# Patient Record
Sex: Female | Born: 1980 | Race: White | Hispanic: No | Marital: Single | Smoking: Never smoker
Health system: Southern US, Community
[De-identification: ages and names within clinical notes are randomized; demographics above are authoritative.]

## PROBLEM LIST (undated history)

## (undated) DIAGNOSIS — G43909 Migraine, unspecified, not intractable, without status migrainosus: Secondary | ICD-10-CM

## (undated) DIAGNOSIS — J45909 Unspecified asthma, uncomplicated: Secondary | ICD-10-CM

## (undated) DIAGNOSIS — F329 Major depressive disorder, single episode, unspecified: Secondary | ICD-10-CM

## (undated) DIAGNOSIS — F419 Anxiety disorder, unspecified: Secondary | ICD-10-CM

## (undated) DIAGNOSIS — T7840XA Allergy, unspecified, initial encounter: Secondary | ICD-10-CM

## (undated) DIAGNOSIS — F32A Depression, unspecified: Secondary | ICD-10-CM

## (undated) DIAGNOSIS — M1990 Unspecified osteoarthritis, unspecified site: Secondary | ICD-10-CM

## (undated) DIAGNOSIS — O24419 Gestational diabetes mellitus in pregnancy, unspecified control: Secondary | ICD-10-CM

## (undated) HISTORY — DX: Unspecified asthma, uncomplicated: J45.909

## (undated) HISTORY — PX: BACK SURGERY: SHX140

## (undated) HISTORY — DX: Gestational diabetes mellitus in pregnancy, unspecified control: O24.419

## (undated) HISTORY — PX: ABDOMINAL HYSTERECTOMY: SHX81

## (undated) HISTORY — PX: CHOLECYSTECTOMY: SHX55

## (undated) HISTORY — PX: TONSILLECTOMY: SUR1361

---

## 2008-11-09 HISTORY — PX: CHOLECYSTECTOMY: SHX55

## 2013-08-10 DIAGNOSIS — K589 Irritable bowel syndrome without diarrhea: Secondary | ICD-10-CM | POA: Insufficient documentation

## 2013-08-10 DIAGNOSIS — G43109 Migraine with aura, not intractable, without status migrainosus: Secondary | ICD-10-CM | POA: Diagnosis present

## 2013-08-10 DIAGNOSIS — J45909 Unspecified asthma, uncomplicated: Secondary | ICD-10-CM | POA: Diagnosis present

## 2013-08-10 DIAGNOSIS — R1013 Epigastric pain: Secondary | ICD-10-CM | POA: Insufficient documentation

## 2013-08-10 DIAGNOSIS — E039 Hypothyroidism, unspecified: Secondary | ICD-10-CM | POA: Diagnosis present

## 2014-05-24 DIAGNOSIS — Z98891 History of uterine scar from previous surgery: Secondary | ICD-10-CM

## 2014-05-24 DIAGNOSIS — O99280 Endocrine, nutritional and metabolic diseases complicating pregnancy, unspecified trimester: Secondary | ICD-10-CM | POA: Insufficient documentation

## 2014-05-24 HISTORY — DX: History of uterine scar from previous surgery: Z98.891

## 2014-09-01 DIAGNOSIS — O24419 Gestational diabetes mellitus in pregnancy, unspecified control: Secondary | ICD-10-CM | POA: Insufficient documentation

## 2014-09-01 HISTORY — DX: Gestational diabetes mellitus in pregnancy, unspecified control: O24.419

## 2014-11-09 DIAGNOSIS — M069 Rheumatoid arthritis, unspecified: Secondary | ICD-10-CM | POA: Insufficient documentation

## 2014-11-09 HISTORY — DX: Rheumatoid arthritis, unspecified: M06.9

## 2014-12-06 DIAGNOSIS — R519 Headache, unspecified: Secondary | ICD-10-CM | POA: Insufficient documentation

## 2015-08-12 DIAGNOSIS — Z79891 Long term (current) use of opiate analgesic: Secondary | ICD-10-CM | POA: Insufficient documentation

## 2015-11-10 HISTORY — PX: ABDOMINAL HYSTERECTOMY: SHX81

## 2017-10-12 DIAGNOSIS — R202 Paresthesia of skin: Secondary | ICD-10-CM | POA: Insufficient documentation

## 2017-10-12 DIAGNOSIS — E860 Dehydration: Secondary | ICD-10-CM | POA: Insufficient documentation

## 2018-02-27 DIAGNOSIS — E1165 Type 2 diabetes mellitus with hyperglycemia: Secondary | ICD-10-CM

## 2018-02-27 DIAGNOSIS — E119 Type 2 diabetes mellitus without complications: Secondary | ICD-10-CM | POA: Insufficient documentation

## 2018-03-04 DIAGNOSIS — F1220 Cannabis dependence, uncomplicated: Secondary | ICD-10-CM | POA: Diagnosis present

## 2018-03-04 DIAGNOSIS — F13959 Sedative, hypnotic or anxiolytic use, unspecified with sedative, hypnotic or anxiolytic-induced psychotic disorder, unspecified: Secondary | ICD-10-CM

## 2018-03-04 HISTORY — DX: Sedative, hypnotic or anxiolytic use, unspecified with sedative, hypnotic or anxiolytic-induced psychotic disorder, unspecified: F13.959

## 2018-09-26 ENCOUNTER — Emergency Department
Admission: EM | Admit: 2018-09-26 | Discharge: 2018-09-26 | Disposition: A | Discharge status: Home / Self Care | Payer: Managed Care, Other (non HMO) | Attending: Emergency Medicine | Admitting: Emergency Medicine

## 2018-09-26 ENCOUNTER — Encounter: Payer: Self-pay | Admitting: Emergency Medicine

## 2018-09-26 DIAGNOSIS — L509 Urticaria, unspecified: Secondary | ICD-10-CM | POA: Diagnosis not present

## 2018-09-26 DIAGNOSIS — R21 Rash and other nonspecific skin eruption: Secondary | ICD-10-CM | POA: Diagnosis present

## 2018-09-26 HISTORY — DX: Depression, unspecified: F32.A

## 2018-09-26 HISTORY — DX: Unspecified osteoarthritis, unspecified site: M19.90

## 2018-09-26 HISTORY — DX: Allergy, unspecified, initial encounter: T78.40XA

## 2018-09-26 HISTORY — DX: Major depressive disorder, single episode, unspecified: F32.9

## 2018-09-26 HISTORY — DX: Anxiety disorder, unspecified: F41.9

## 2018-09-26 HISTORY — DX: Migraine, unspecified, not intractable, without status migrainosus: G43.909

## 2018-09-26 MED ORDER — SODIUM CHLORIDE 0.9 % IV BOLUS
1000.0000 mL | Freq: Once | INTRAVENOUS | Status: AC
  Administered 2018-09-26: 1000 mL via INTRAVENOUS

## 2018-09-26 MED ORDER — EPINEPHRINE 0.3 MG/0.3ML IJ SOAJ
INTRAMUSCULAR | Status: AC
  Administered 2018-09-26: 0.3 mg via INTRAMUSCULAR
  Filled 2018-09-26: qty 0.3

## 2018-09-26 MED ORDER — EPINEPHRINE 0.3 MG/0.3ML IJ SOAJ
0.3000 mg | Freq: Once | INTRAMUSCULAR | Status: AC
  Administered 2018-09-26: 0.3 mg via INTRAMUSCULAR

## 2018-09-26 MED ORDER — PREDNISONE 10 MG (21) PO TBPK: ORAL_TABLET | ORAL | 0 refills | Status: DC

## 2018-09-26 MED ORDER — EPINEPHRINE 0.3 MG/0.3ML IJ SOAJ: 0.3000 mg | Freq: Once | INTRAMUSCULAR | 1 refills | Status: AC

## 2018-09-26 NOTE — ED Notes (Signed)
Patient is reporting itching to her throat.

## 2018-09-26 NOTE — ED Notes (Signed)
ED Provider at bedside. 

## 2018-09-26 NOTE — ED Provider Notes (Signed)
[facility] Emergency Department Provider Note   ____________________________________________   I have reviewed the triage vital signs and the nursing notes.   HISTORY  Chief Complaint Urticaria   History limited by: Not Limited   HPI Clair Bardwell is a 37 y.o. female who presents to the emergency department today because of concern for diffuse rash and itchiness. The patient states that she has multiple allergies and thinks that this allergic reaction is due to eating a steak at a restaurant where they also prepare seafood. The patient states that the rash started this morning and has been progressive. It is significantly itchy. She did take steroids and benadryl this morning without any relief. The patient denies any difficulty breathing however feels like her mouth is tingling and big. The patient also has had some associated vomiting. She has required epinephrine shots in the past however was hesitant to give herself a shot today because her epipen had expired.   Per medical record review patient has a history of allergic reaction  Past Medical History:  Diagnosis Date  . Allergic reaction   . Anxiety   . Arthritis   . Depression   . Migraines     There are no active problems to display for this patient.   Past Surgical History:  Procedure Laterality Date  . ABDOMINAL HYSTERECTOMY     partial  . BACK SURGERY    . CESAREAN SECTION    . CHOLECYSTECTOMY    . TONSILLECTOMY      Prior to Admission medications   Not on File    Allergies Azithromycin  No family history on file.  Social History Social History   Tobacco Use  . Smoking status: Never Smoker  . Smokeless tobacco: Never Used  Substance Use Topics  . Alcohol use: Not on file  . Drug use: Not on file    Review of Systems Constitutional: No fever/chills Eyes: No visual changes. ENT: Positive for mouth tingling. Cardiovascular: Denies chest pain. Respiratory: Denies  shortness of breath. Gastrointestinal: No abdominal pain.  No nausea, no vomiting.  No diarrhea.   Genitourinary: Negative for dysuria. Musculoskeletal: Negative for back pain. Skin: Positive for rash, itchiness. Neurological: Negative for headaches, focal weakness or numbness.  ____________________________________________   PHYSICAL EXAM:  VITAL SIGNS: ED Triage Vitals  Enc Vitals Group     BP 09/26/18 1541 119/90     Pulse Rate 09/26/18 1541 (!) 101     Resp 09/26/18 1541 16     Temp 09/26/18 1541 98.1 F (36.7 C)     Temp Source 09/26/18 1541 Oral     SpO2 09/26/18 1541 94 %     Weight 09/26/18 1542 175 lb (79.4 kg)     Height 09/26/18 1542 5' 3.5" (1.613 m)     Head Circumference --      Peak Flow --      Pain Score 09/26/18 1541 6   Constitutional: Alert and oriented.  Eyes: Conjunctivae are normal.  ENT      Head: Normocephalic and atraumatic.      Nose: No congestion/rhinnorhea.      Mouth/Throat: Mucous membranes are moist.      Neck: No stridor. Hematological/Lymphatic/Immunilogical: No cervical lymphadenopathy. Cardiovascular: Normal rate, regular rhythm.  No murmurs, rubs, or gallops.  Respiratory: Normal respiratory effort without tachypnea nor retractions. Breath sounds are clear and equal bilaterally. No wheezes/rales/rhonchi. Gastrointestinal: Soft and non tender. No rebound. No guarding.  Genitourinary: Deferred Musculoskeletal: Normal range of motion  in all extremities. No lower extremity edema. Neurologic:  Normal speech and language. No gross focal neurologic deficits are appreciated.  Skin:  Diffuse urticarial rash Psychiatric: Mood and affect are normal. Speech and behavior are normal. Patient exhibits appropriate insight and judgment.  ____________________________________________    LABS (pertinent positives/negatives)  None  ____________________________________________   EKG  None  ____________________________________________     RADIOLOGY  None   ____________________________________________   PROCEDURES  Procedures  ____________________________________________   INITIAL IMPRESSION / ASSESSMENT AND PLAN / ED COURSE  Pertinent labs & imaging results that were available during my care of the patient were reviewed by me and considered in my medical decision making (see chart for details).   Patient presented to the emergency department today because of concerns for urticaria after possible seafood ingestion.  On exam patient had diffuse urticarial rash.  She was complaining of some mild changes.  She had already taken Benadryl and prednisone at home.  Discussed with patient that we can give epinephrine.  Patient states she does not have any heart disease.  Epinephrine was given and patient did have a good symptom relief with epinephrine.  Will plan on discharging with steroids and EpiPen prescriptions.  Prior to discharge patient states she also felt she might have a lump in her right breast near the axilla.  I did palpate this area and while I did not feel an obvious lump did discuss with patient importance of following up with her primary care doctor for possible imaging.  Do wonder however if patient had some lymph nodes in that area given she was also complaining of tenderness.   ____________________________________________   FINAL CLINICAL IMPRESSION(S) / ED DIAGNOSES  Final diagnoses:  Urticaria     Note: This dictation was prepared with Dragon dictation. Any transcriptional errors that result from this process are unintentional     Phineas Semen, MD 09/26/18 [phone]

## 2018-09-26 NOTE — ED Triage Notes (Signed)
Patient presents to the ED with rash and itching.  Patient reports having vomiting and diarrhea on Thursday and then on Saturday, patient began to have a rash.  Patient states, this made her think she was having a food allergy reaction.  Patient states she has had reactions that present like this before.  Patient has rash to face and back at this time.  Patient reports taking a total of 75mg  of benadryl and 40mg  of prednisone at home.  Patient reports having severe allergic reactions in the past.  Patient denies difficulty breathing at this time.  Denies restricted airway.  Patient speaking clearly at this time.

## 2018-09-26 NOTE — Discharge Instructions (Signed)
Please seek medical attention for any high fevers, chest pain, shortness of breath, change in behavior, persistent vomiting, bloody stool or any other new or concerning symptoms.  

## 2018-09-26 NOTE — ED Notes (Signed)
Pt hives significantly decreased, states tongue and lips feeling normal.  MD notified.  Will continue to monitor.

## 2018-10-05 DIAGNOSIS — T7611XA Adult physical abuse, suspected, initial encounter: Secondary | ICD-10-CM | POA: Insufficient documentation

## 2018-10-05 DIAGNOSIS — Z79899 Other long term (current) drug therapy: Secondary | ICD-10-CM | POA: Insufficient documentation

## 2018-10-05 DIAGNOSIS — T424X3A Poisoning by benzodiazepines, assault, initial encounter: Secondary | ICD-10-CM | POA: Insufficient documentation
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
## 2018-10-06 ENCOUNTER — Emergency Department: Payer: Managed Care, Other (non HMO)

## 2018-10-06 ENCOUNTER — Emergency Department
Admission: EM | Admit: 2018-10-06 | Discharge: 2018-10-06 | Disposition: A | Discharge status: Home / Self Care | Payer: Managed Care, Other (non HMO) | Attending: Emergency Medicine | Admitting: Emergency Medicine

## 2018-10-06 ENCOUNTER — Other Ambulatory Visit: Payer: Self-pay

## 2018-10-06 DIAGNOSIS — T50903A Poisoning by unspecified drugs, medicaments and biological substances, assault, initial encounter: Secondary | ICD-10-CM

## 2018-10-06 LAB — ETHANOL

## 2018-10-06 LAB — CBC WITH DIFFERENTIAL/PLATELET
Abs Immature Granulocytes: 0.04 10*3/uL (ref 0.00–0.07)
Basophils Absolute: 0 10*3/uL (ref 0.0–0.1)
Basophils Relative: 0 %
EOS PCT: 0 %
Eosinophils Absolute: 0 10*3/uL (ref 0.0–0.5)
HEMATOCRIT: 39 % (ref 36.0–46.0)
Hemoglobin: 12.9 g/dL (ref 12.0–15.0)
Immature Granulocytes: 0 %
LYMPHS ABS: 2.7 10*3/uL (ref 0.7–4.0)
LYMPHS PCT: 23 %
MCH: 29.1 pg (ref 26.0–34.0)
MCHC: 33.1 g/dL (ref 30.0–36.0)
MCV: 88 fL (ref 80.0–100.0)
MONO ABS: 0.8 10*3/uL (ref 0.1–1.0)
Monocytes Relative: 7 %
Neutro Abs: 7.8 10*3/uL — ABNORMAL HIGH (ref 1.7–7.7)
Neutrophils Relative %: 70 %
Platelets: 275 10*3/uL (ref 150–400)
RBC: 4.43 MIL/uL (ref 3.87–5.11)
RDW: 13 % (ref 11.5–15.5)
WBC: 11.4 10*3/uL — AB (ref 4.0–10.5)
nRBC: 0 % (ref 0.0–0.2)

## 2018-10-06 LAB — COMPREHENSIVE METABOLIC PANEL
ALT: 61 U/L — AB (ref 0–44)
AST: 129 U/L — AB (ref 15–41)
Albumin: 3.8 g/dL (ref 3.5–5.0)
Alkaline Phosphatase: 163 U/L — ABNORMAL HIGH (ref 38–126)
Anion gap: 10 (ref 5–15)
BUN: 10 mg/dL (ref 6–20)
CHLORIDE: 104 mmol/L (ref 98–111)
CO2: 26 mmol/L (ref 22–32)
CREATININE: 0.89 mg/dL (ref 0.44–1.00)
Calcium: 8.5 mg/dL — ABNORMAL LOW (ref 8.9–10.3)
GFR calc Af Amer: >60 mL/min (ref >60)
Glucose, Bld: 124 mg/dL — ABNORMAL HIGH (ref 70–99)
POTASSIUM: 2.8 mmol/L — AB (ref 3.5–5.1)
Sodium: 140 mmol/L (ref 135–145)
Total Bilirubin: 0.7 mg/dL (ref 0.3–1.2)
Total Protein: 6.6 g/dL (ref 6.5–8.1)

## 2018-10-06 LAB — URINE DRUG SCREEN, QUALITATIVE (ARMC ONLY)
Amphetamines, Ur Screen: NOT DETECTED
BARBITURATES, UR SCREEN: NOT DETECTED
Benzodiazepine, Ur Scrn: POSITIVE — AB
CANNABINOID 50 NG, UR ~~LOC~~: POSITIVE — AB
Cocaine Metabolite,Ur ~~LOC~~: NOT DETECTED
MDMA (Ecstasy)Ur Screen: NOT DETECTED
Methadone Scn, Ur: NOT DETECTED
OPIATE, UR SCREEN: NOT DETECTED
PHENCYCLIDINE (PCP) UR S: NOT DETECTED
Tricyclic, Ur Screen: NOT DETECTED

## 2018-10-06 LAB — ACETAMINOPHEN LEVEL

## 2018-10-06 LAB — SALICYLATE LEVEL: Salicylate Lvl: <7 mg/dL (ref 2.8–30.0)

## 2018-10-06 MED ORDER — ACETYLCYSTEINE 20 % IN SOLN: 140.0000 mg/kg | Freq: Once | RESPIRATORY_TRACT | Status: DC

## 2018-10-06 MED ORDER — ACETYLCYSTEINE 20 % IN SOLN: 70.0000 mg/kg | RESPIRATORY_TRACT | Status: DC
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
## 2018-10-06 NOTE — ED Notes (Signed)
Pt went to CT
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
## 2018-10-29 DIAGNOSIS — F609 Personality disorder, unspecified: Secondary | ICD-10-CM | POA: Insufficient documentation

## 2018-11-12 ENCOUNTER — Encounter: Payer: Self-pay | Admitting: Emergency Medicine

## 2018-11-12 ENCOUNTER — Other Ambulatory Visit: Payer: Self-pay

## 2018-11-12 ENCOUNTER — Emergency Department
Admission: EM | Admit: 2018-11-12 | Discharge: 2018-11-12 | Disposition: A | Discharge status: Home / Self Care | Payer: Managed Care, Other (non HMO) | Attending: Emergency Medicine | Admitting: Emergency Medicine

## 2018-11-12 DIAGNOSIS — Z5321 Procedure and treatment not carried out due to patient leaving prior to being seen by health care provider: Secondary | ICD-10-CM | POA: Diagnosis not present

## 2018-11-12 DIAGNOSIS — Z043 Encounter for examination and observation following other accident: Secondary | ICD-10-CM | POA: Diagnosis present

## 2018-11-12 NOTE — ED Triage Notes (Signed)
Dog bite both hands. States just adopted dog today from private individual so has no information on dog.

## 2018-11-12 NOTE — ED Notes (Signed)
Per CNA, the patient came out to the desk and said she was leaving and left the department. Pt had not yet been evaluated by a provider.

## 2018-11-12 NOTE — ED Notes (Signed)
Pt got bit by a puppy she got from a person on [employer] and when she googled rabies she got worried that she was going to die. Spoke to pt about finding out the rabies info. The dog did get rabies per the seller and the pt will get a copy to confirm. She does not want [place] to contact animal control at this time. If she is unable to confirm she will then contact animal control.

## 2020-04-22 ENCOUNTER — Ambulatory Visit: Payer: Managed Care, Other (non HMO) | Admitting: Internal Medicine

## 2020-04-22 ENCOUNTER — Encounter: Payer: Self-pay | Admitting: Internal Medicine

## 2020-04-22 ENCOUNTER — Other Ambulatory Visit: Payer: Self-pay

## 2020-04-22 VITALS — BP 122/80 | HR 70 | Resp 12 | Ht 63.5 in | Wt 212.0 lb

## 2020-04-22 DIAGNOSIS — F419 Anxiety disorder, unspecified: Secondary | ICD-10-CM

## 2020-04-22 DIAGNOSIS — F410 Panic disorder [episodic paroxysmal anxiety] without agoraphobia: Secondary | ICD-10-CM

## 2020-04-22 DIAGNOSIS — F4000 Agoraphobia, unspecified: Secondary | ICD-10-CM

## 2020-04-22 DIAGNOSIS — F331 Major depressive disorder, recurrent, moderate: Secondary | ICD-10-CM

## 2020-04-22 DIAGNOSIS — M059 Rheumatoid arthritis with rheumatoid factor, unspecified: Secondary | ICD-10-CM

## 2020-04-22 MED ORDER — SERTRALINE HCL 100 MG PO TABS: ORAL_TABLET | ORAL | 2 refills | Status: DC

## 2020-04-22 MED ORDER — ZOLPIDEM TARTRATE 10 MG PO TABS: ORAL_TABLET | ORAL | 0 refills | Status: DC
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
## 2020-04-29 ENCOUNTER — Telehealth (INDEPENDENT_AMBULATORY_CARE_PROVIDER_SITE_OTHER): Payer: Self-pay | Admitting: Internal Medicine

## 2020-04-29 DIAGNOSIS — F331 Major depressive disorder, recurrent, moderate: Secondary | ICD-10-CM | POA: Insufficient documentation

## 2020-04-29 DIAGNOSIS — F4000 Agoraphobia, unspecified: Secondary | ICD-10-CM | POA: Insufficient documentation

## 2020-04-29 DIAGNOSIS — F410 Panic disorder [episodic paroxysmal anxiety] without agoraphobia: Secondary | ICD-10-CM

## 2020-04-29 DIAGNOSIS — F419 Anxiety disorder, unspecified: Secondary | ICD-10-CM | POA: Insufficient documentation

## 2020-04-29 NOTE — Progress Notes (Signed)
    Subjective:    Patient ID: Erika Rhodes, female   DOB: [birth year], 39 y.o.   MRN: [id number]   HPI   Virtual Visit via [employer]  Follow up for depression/panic disorder/anxiety/insomnia.  Taking the Sertraline 50 mg daily in the evening around 6 p.m. Was taking the ambien 10 mg at bedtime, but had terrible nightmares, so stopped about 4 days ago.  Still with fairly vivid dreaming. Sleep was bad for a day or two.  Sleeping better now, but still with vivid dreaming.  Last night slept 8 hours, the night before, maybe 5 hours. She was transitioning from Fluoxetine to Zoloft.  Previously on 60 mg of Fluoxetine, but had not been taking that amount as she stretched out her medication prior to being seen last week. No suicidal ideation. She was depressed a bit more last week with the transition, but over the weekend, has started feeling better.  Current Meds  Medication Sig  . sertraline (ZOLOFT) 100 MG tablet 1/2 tab by mouth daily.       Allergies  Allergen Reactions  . Azithromycin Anaphylaxis     Review of Systems    Objective:   There were no vitals taken for this visit.  Physical Exam  Appears well groomed and in NAD.  Converses easily.   Assessment & Plan   Depression/Panic Disorder/Anxiety:  Elected to continue with Sertraline 50 mg daily.  If her symptoms do not continue to improve or vivid dreams side effect does not continue to dissipate, will reconsider use of Sertraline.   Follow up in 2 weeks. No additional medication for sleep as that seems to be improving without sleep aid.  If do need additional help down the road--consider Trazodone.

## 2020-05-24 ENCOUNTER — Telehealth: Payer: Self-pay

## 2020-05-24 MED ORDER — SERTRALINE HCL 100 MG PO TABS: 100.0000 mg | ORAL_TABLET | Freq: Every day | ORAL | 2 refills | Status: DC

## 2020-05-24 NOTE — Telephone Encounter (Signed)
Spoke with patient states she has been taking 100 mg for 2 weeks now. States she thought Dr. Delrae Alfred told her to increase to 100 mg. States she is still having strong dreams but not as intense as before. Informed I would speak with Dr. Lawerance Bach and call her back but did schedule a follow up with Dr. Lawerance Bach for Friday 05/31/20 @ 12:30 pm.

## 2020-05-24 NOTE — Telephone Encounter (Signed)
Spoke back to Dr. Delrae Alfred states since patient has been taking 100 mg okay to fill for 100. Inform patient to keep follow up. Spoke with patient informed Rx sent to pharmacy and to keep her follow up with Dr. Delrae Alfred on 05/31/20. Patient verbalized understanding.

## 2020-05-24 NOTE — Telephone Encounter (Signed)
Patient called stating her and Dr. Delrae Alfred had discussed patient taking sertraline 100 mg daily. States the pharmacy told her it was for sertraline 100 mg 1/2 tablet daily. Patient asking for medication to fixed and sent in for 1 daily. Informed patient after looking in notes patient was to be on 50 mg and not 100 mg. Informed I would need to speak with Dr. Delrae Alfred and call her back. Patient verbalized understanding.  Spoke with Dr. Delrae Alfred regarding medication. States patient was on a high dose of prozac so she started her on sertraline 50 mg. Patient was to follow up in 2 weeks but does not appear appointment was made. Patient stated she was having vivid dreams on the 50 mg so Dr. Delrae Alfred wanted follow up before starting her on any higher doses. Will call patient and gather more information.

## 2020-05-31 ENCOUNTER — Encounter: Payer: Self-pay | Admitting: Internal Medicine

## 2020-05-31 ENCOUNTER — Ambulatory Visit: Payer: Self-pay | Admitting: Internal Medicine

## 2020-05-31 VITALS — BP 124/82 | HR 75 | Ht 63.5 in | Wt 217.0 lb

## 2020-05-31 DIAGNOSIS — F410 Panic disorder [episodic paroxysmal anxiety] without agoraphobia: Secondary | ICD-10-CM

## 2020-05-31 DIAGNOSIS — R202 Paresthesia of skin: Secondary | ICD-10-CM

## 2020-05-31 DIAGNOSIS — F4000 Agoraphobia, unspecified: Secondary | ICD-10-CM

## 2020-05-31 DIAGNOSIS — G5603 Carpal tunnel syndrome, bilateral upper limbs: Secondary | ICD-10-CM

## 2020-05-31 DIAGNOSIS — F419 Anxiety disorder, unspecified: Secondary | ICD-10-CM

## 2020-05-31 DIAGNOSIS — R200 Anesthesia of skin: Secondary | ICD-10-CM

## 2020-05-31 DIAGNOSIS — F331 Major depressive disorder, recurrent, moderate: Secondary | ICD-10-CM

## 2020-05-31 NOTE — Progress Notes (Signed)
    Subjective:    Patient ID: Erika Rhodes, female   DOB: [birth year], 39 y.o.   MRN: [id number]   HPI   1.  Depression/anxiety/agoraphobia:  She self increased to 100 mg of Sertraline maybe 3 weeks ago, which she feels really helped.    2.  Does have pins and needles in hands and feet.  Especially with hands on steering wheel.  Also, more prominent in hands when awakening.  Starts on thumb sides.  Daily issue.  3.  Does have history of diabetic level A1C when on prednisone for RA in past, but A1C checked in 12.2019 was normal at 5.2%.   She is not followed by Rheumatology currently. Has not done well on DMARDs or biologics.  States she had skin lesion healing issues. Plaquenil  MTX, Humira, Orencia.  She will sign release of info to get her records as not in Care Everywhere. Ultimately, used medical MJ in past with good relief, but has not used in some time and unwilling to do so now with her legal issues.  Current Meds  Medication Sig  . sertraline (ZOLOFT) 100 MG tablet Take 1 tablet (100 mg total) by mouth daily.   Allergies  Allergen Reactions  . Azithromycin Anaphylaxis     Review of Systems    Objective:   BP 124/82 (BP Location: Left Arm, Patient Position: Sitting, Cuff Size: Normal)   Pulse 75   Ht 5' 3.5" (1.613 m)   Wt (!) 217 lb (98.4 kg)   BMI 37.84 kg/m   Physical Exam   NAD Happy, good eye contact.  Dressed neatly.  Converses easily. Cat scratches on hands + Tinels and Phalens with median nerve at volar wrist bilaterally. No findings with feet.   Assessment & Plan  1.  Depression/Anxiety/Agoraphobia:  Much improved.  Encouraged her to make appt with Danton Clap to work on calming measures with panic attacks.  Continue Sertraline 100 mg.  2.  Carpal Tunnel Syndrome:  Cock up splints nightly and when able to during day.  Aleve or Naproxen--500 mg twice daily for 2 weeks.  3.  Numbness and tingling in toes bilaterally--no bare feet or flip fops.  Good  shoes  2.  History of RA:  Sending for records--she will sign and call in contact info.  Not having huge symptoms currently.

## 2020-05-31 NOTE — Patient Instructions (Signed)
Cock up splints for both wrists every night.  No flip flops or bare feet--get slippers or shoes with good support:  [employer] Wicked Good Slippers--mules.  There are more than one called this--look for the ones I described.  Naproxen 500 mg twice daily with meals for next 2 weeks.

## 2020-06-06 ENCOUNTER — Other Ambulatory Visit: Payer: Self-pay

## 2020-06-13 ENCOUNTER — Other Ambulatory Visit (INDEPENDENT_AMBULATORY_CARE_PROVIDER_SITE_OTHER): Payer: Self-pay

## 2020-06-13 ENCOUNTER — Other Ambulatory Visit: Payer: Self-pay

## 2020-06-13 DIAGNOSIS — Z79899 Other long term (current) drug therapy: Secondary | ICD-10-CM

## 2020-06-13 DIAGNOSIS — Z13220 Encounter for screening for lipoid disorders: Secondary | ICD-10-CM

## 2020-06-13 DIAGNOSIS — R5383 Other fatigue: Secondary | ICD-10-CM

## 2020-06-13 DIAGNOSIS — R739 Hyperglycemia, unspecified: Secondary | ICD-10-CM

## 2020-06-14 LAB — LIPID PANEL W/O CHOL/HDL RATIO
Cholesterol, Total: 253 mg/dL — ABNORMAL HIGH (ref 100–199)
HDL: 49 mg/dL (ref >39)
LDL Chol Calc (NIH): 150 mg/dL — ABNORMAL HIGH (ref 0–99)
Triglycerides: 294 mg/dL — ABNORMAL HIGH (ref 0–149)
VLDL Cholesterol Cal: 54 mg/dL — ABNORMAL HIGH (ref 5–40)

## 2020-06-14 LAB — CBC WITH DIFFERENTIAL/PLATELET
Basophils Absolute: 0.1 10*3/uL (ref 0.0–0.2)
Basos: 1 %
EOS (ABSOLUTE): 0.1 10*3/uL (ref 0.0–0.4)
Eos: 1 %
Hematocrit: 50.9 % — ABNORMAL HIGH (ref 34.0–46.6)
Hemoglobin: 17.4 g/dL — ABNORMAL HIGH (ref 11.1–15.9)
Immature Grans (Abs): 0 10*3/uL (ref 0.0–0.1)
Immature Granulocytes: 0 %
Lymphocytes Absolute: 2.9 10*3/uL (ref 0.7–3.1)
Lymphs: 18 %
MCH: 33.3 pg — ABNORMAL HIGH (ref 26.6–33.0)
MCHC: 34.2 g/dL (ref 31.5–35.7)
MCV: 97 fL (ref 79–97)
Monocytes Absolute: 0.9 10*3/uL (ref 0.1–0.9)
Monocytes: 5 %
Neutrophils Absolute: 12 10*3/uL — ABNORMAL HIGH (ref 1.4–7.0)
Neutrophils: 75 %
Platelets: 324 10*3/uL (ref 150–450)
RBC: 5.23 x10E6/uL (ref 3.77–5.28)
RDW: 12 % (ref 11.7–15.4)
WBC: 16 10*3/uL — ABNORMAL HIGH (ref 3.4–10.8)

## 2020-06-14 LAB — COMPREHENSIVE METABOLIC PANEL
ALT: 32 IU/L (ref 0–32)
AST: 30 IU/L (ref 0–40)
Albumin/Globulin Ratio: 1.8 (ref 1.2–2.2)
Albumin: 4.5 g/dL (ref 3.8–4.8)
Alkaline Phosphatase: 72 IU/L (ref 48–121)
BUN/Creatinine Ratio: 15 (ref 9–23)
BUN: 13 mg/dL (ref 6–20)
Bilirubin Total: 0.3 mg/dL (ref 0.0–1.2)
CO2: 22 mmol/L (ref 20–29)
Calcium: 9.5 mg/dL (ref 8.7–10.2)
Chloride: 97 mmol/L (ref 96–106)
Creatinine, Ser: 0.89 mg/dL (ref 0.57–1.00)
GFR calc Af Amer: 96 mL/min/{1.73_m2} (ref >59)
GFR calc non Af Amer: 84 mL/min/{1.73_m2} (ref >59)
Globulin, Total: 2.5 g/dL (ref 1.5–4.5)
Glucose: 146 mg/dL — ABNORMAL HIGH (ref 65–99)
Potassium: 3.7 mmol/L (ref 3.5–5.2)
Sodium: 136 mmol/L (ref 134–144)
Total Protein: 7 g/dL (ref 6.0–8.5)

## 2020-06-14 LAB — HGB A1C W/O EAG: Hgb A1c MFr Bld: 6.2 % — ABNORMAL HIGH (ref 4.8–5.6)

## 2020-06-14 LAB — TSH: TSH: 6.45 u[IU]/mL — ABNORMAL HIGH (ref 0.450–4.500)

## 2020-07-05 ENCOUNTER — Other Ambulatory Visit: Payer: Self-pay

## 2020-07-05 DIAGNOSIS — D72829 Elevated white blood cell count, unspecified: Secondary | ICD-10-CM

## 2020-07-05 DIAGNOSIS — R7989 Other specified abnormal findings of blood chemistry: Secondary | ICD-10-CM

## 2020-07-06 LAB — CBC WITH DIFFERENTIAL/PLATELET
Basophils Absolute: 0 10*3/uL (ref 0.0–0.2)
Basos: 0 %
EOS (ABSOLUTE): 0.3 10*3/uL (ref 0.0–0.4)
Eos: 3 %
Hematocrit: 46.2 % (ref 34.0–46.6)
Hemoglobin: 16.1 g/dL — ABNORMAL HIGH (ref 11.1–15.9)
Immature Grans (Abs): 0 10*3/uL (ref 0.0–0.1)
Immature Granulocytes: 0 %
Lymphocytes Absolute: 2.4 10*3/uL (ref 0.7–3.1)
Lymphs: 24 %
MCH: 33.5 pg — ABNORMAL HIGH (ref 26.6–33.0)
MCHC: 34.8 g/dL (ref 31.5–35.7)
MCV: 96 fL (ref 79–97)
Monocytes Absolute: 0.5 10*3/uL (ref 0.1–0.9)
Monocytes: 5 %
Neutrophils Absolute: 6.8 10*3/uL (ref 1.4–7.0)
Neutrophils: 68 %
Platelets: 289 10*3/uL (ref 150–450)
RBC: 4.81 x10E6/uL (ref 3.77–5.28)
RDW: 11.9 % (ref 11.7–15.4)
WBC: 10.1 10*3/uL (ref 3.4–10.8)

## 2020-07-06 LAB — T4, FREE: Free T4: 0.82 ng/dL (ref 0.82–1.77)

## 2020-07-09 MED ORDER — LEVOTHYROXINE SODIUM 25 MCG PO TABS: ORAL_TABLET | ORAL | 11 refills | Status: DC

## 2020-07-09 NOTE — Addendum Note (Signed)
Addended by: Marcene Duos on: 07/09/2020 11:17 AM   Modules accepted: Orders

## 2020-07-16 ENCOUNTER — Ambulatory Visit: Payer: Self-pay | Admitting: Internal Medicine

## 2020-08-20 ENCOUNTER — Other Ambulatory Visit: Payer: Self-pay

## 2020-09-04 ENCOUNTER — Other Ambulatory Visit: Payer: Self-pay | Admitting: Internal Medicine

## 2020-09-24 ENCOUNTER — Emergency Department (HOSPITAL_BASED_OUTPATIENT_CLINIC_OR_DEPARTMENT_OTHER)
Admission: EM | Admit: 2020-09-24 | Discharge: 2020-09-24 | Disposition: A | Discharge status: Home / Self Care | Payer: Self-pay | Attending: Emergency Medicine | Admitting: Emergency Medicine

## 2020-09-24 ENCOUNTER — Emergency Department (HOSPITAL_BASED_OUTPATIENT_CLINIC_OR_DEPARTMENT_OTHER): Payer: Self-pay

## 2020-09-24 ENCOUNTER — Other Ambulatory Visit: Payer: Self-pay

## 2020-09-24 DIAGNOSIS — E876 Hypokalemia: Secondary | ICD-10-CM | POA: Insufficient documentation

## 2020-09-24 DIAGNOSIS — R202 Paresthesia of skin: Secondary | ICD-10-CM

## 2020-09-24 DIAGNOSIS — J45909 Unspecified asthma, uncomplicated: Secondary | ICD-10-CM | POA: Insufficient documentation

## 2020-09-24 DIAGNOSIS — R000 Tachycardia, unspecified: Secondary | ICD-10-CM | POA: Insufficient documentation

## 2020-09-24 DIAGNOSIS — R200 Anesthesia of skin: Secondary | ICD-10-CM | POA: Insufficient documentation

## 2020-09-24 LAB — COMPREHENSIVE METABOLIC PANEL
ALT: 49 U/L — ABNORMAL HIGH (ref 0–44)
AST: 32 U/L (ref 15–41)
Albumin: 4.6 g/dL (ref 3.5–5.0)
Alkaline Phosphatase: 73 U/L (ref 38–126)
Anion gap: 10 (ref 5–15)
BUN: 18 mg/dL (ref 6–20)
CO2: 24 mmol/L (ref 22–32)
Calcium: 9.4 mg/dL (ref 8.9–10.3)
Chloride: 100 mmol/L (ref 98–111)
Creatinine, Ser: 1.07 mg/dL — ABNORMAL HIGH (ref 0.44–1.00)
GFR, Estimated: >60 mL/min (ref >60)
Glucose, Bld: 226 mg/dL — ABNORMAL HIGH (ref 70–99)
Potassium: 3.2 mmol/L — ABNORMAL LOW (ref 3.5–5.1)
Sodium: 134 mmol/L — ABNORMAL LOW (ref 135–145)
Total Bilirubin: 0.7 mg/dL (ref 0.3–1.2)
Total Protein: 8.1 g/dL (ref 6.5–8.1)

## 2020-09-24 LAB — CBC WITH DIFFERENTIAL/PLATELET
Abs Immature Granulocytes: 0.05 10*3/uL (ref 0.00–0.07)
Basophils Absolute: 0.1 10*3/uL (ref 0.0–0.1)
Basophils Relative: 0 %
Eosinophils Absolute: 0.1 10*3/uL (ref 0.0–0.5)
Eosinophils Relative: 0 %
HCT: 43.1 % (ref 36.0–46.0)
Hemoglobin: 15.5 g/dL — ABNORMAL HIGH (ref 12.0–15.0)
Immature Granulocytes: 0 %
Lymphocytes Relative: 18 %
Lymphs Abs: 2.4 10*3/uL (ref 0.7–4.0)
MCH: 31.4 pg (ref 26.0–34.0)
MCHC: 36 g/dL (ref 30.0–36.0)
MCV: 87.4 fL (ref 80.0–100.0)
Monocytes Absolute: 0.8 10*3/uL (ref 0.1–1.0)
Monocytes Relative: 6 %
Neutro Abs: 9.7 10*3/uL — ABNORMAL HIGH (ref 1.7–7.7)
Neutrophils Relative %: 76 %
Platelets: 308 10*3/uL (ref 150–400)
RBC: 4.93 MIL/uL (ref 3.87–5.11)
RDW: 11.7 % (ref 11.5–15.5)
WBC: 13 10*3/uL — ABNORMAL HIGH (ref 4.0–10.5)
nRBC: 0 % (ref 0.0–0.2)

## 2020-09-24 LAB — MAGNESIUM: Magnesium: 1.8 mg/dL (ref 1.7–2.4)

## 2020-09-24 MED ORDER — MAGNESIUM SULFATE 2 GM/50ML IV SOLN
2.0000 g | Freq: Once | INTRAVENOUS | Status: AC
  Administered 2020-09-24: 2 g via INTRAVENOUS
  Filled 2020-09-24: qty 50

## 2020-09-24 MED ORDER — POTASSIUM CHLORIDE 10 MEQ/100ML IV SOLN
10.0000 meq | Freq: Once | INTRAVENOUS | Status: AC
  Administered 2020-09-24: 10 meq via INTRAVENOUS
  Filled 2020-09-24: qty 100

## 2020-09-24 MED ORDER — SODIUM CHLORIDE 0.9 % IV BOLUS
1000.0000 mL | Freq: Once | INTRAVENOUS | Status: AC
  Administered 2020-09-24: 1000 mL via INTRAVENOUS

## 2020-09-24 MED ORDER — KETOROLAC TROMETHAMINE 30 MG/ML IJ SOLN
30.0000 mg | Freq: Once | INTRAMUSCULAR | Status: AC
  Administered 2020-09-24: 30 mg via INTRAVENOUS
  Filled 2020-09-24: qty 1

## 2020-09-24 MED ORDER — SODIUM CHLORIDE 0.9 % IV SOLN
INTRAVENOUS | Status: DC | PRN
  Administered 2020-09-24: 250 mL via INTRAVENOUS

## 2020-09-24 MED ORDER — POTASSIUM CHLORIDE CRYS ER 20 MEQ PO TBCR
40.0000 meq | EXTENDED_RELEASE_TABLET | Freq: Once | ORAL | Status: AC
  Administered 2020-09-24: 40 meq via ORAL
  Filled 2020-09-24: qty 2

## 2020-09-24 MED ORDER — POTASSIUM CHLORIDE ER 10 MEQ PO TBCR: 40.0000 meq | EXTENDED_RELEASE_TABLET | Freq: Every day | ORAL | 0 refills | Status: DC

## 2020-09-24 NOTE — ED Notes (Signed)
EDP at bedside  

## 2020-09-24 NOTE — ED Triage Notes (Signed)
Pt ambulatory to triage, reports intermittent tingling like "pins and needles" to bilaterally to arms, hands, legs and face. Pt denies injury, reports a pain down left side of face, pain down spine periodically. No deficits in triage

## 2020-09-24 NOTE — Discharge Instructions (Addendum)
The CT of your head did not show any abnormalities. Your potassium level was slightly low.  Take potassium for the next few days starting tomorrow. You will need to follow-up outpatient with neurology for further work-up of the symptoms. Return to the ER if you start to experience severe headache, neck stiffness, injuries or falls, chest pain or shortness of breath.

## 2020-09-24 NOTE — ED Provider Notes (Signed)
[facility] EMERGENCY DEPARTMENT Provider Note   CSN: [id number] Arrival date & time: 09/24/20  5621     History Chief Complaint  Patient presents with  . Numbness    Erika Rhodes is a 39 y.o. female with a past medical history of RA currently on as needed prednisone, anxiety, panic disorder, presenting to the ED with a chief complaint of tingling.  Woke up yesterday morning with pins and needle sensation through her upper and lower extremities and face.  Reports symptoms have been persistent since yesterday.  She also reports itching throughout her entire body but "it does not get better when I scratch it."  Denies any rash.  She initially thought she was having allergic reaction so she took prednisone and Benadryl yesterday without much improvement.  She denies history of similar symptoms in the past.  She has been having a sharp shooting pain along the left side of her face that has been intermittent since yesterday as well.  Denies any fevers, chest pain, shortness of breath, lip or tongue swelling, changes to gait.  No recent injuries or falls.  No anticoagulant use.  HPI     Past Medical History:  Diagnosis Date  . Allergic reaction   . Anxiety   . Asthma   . Depression   . DM (diabetes mellitus), gestational   . Migraines   . Rheumatoid arthritis (HCC) 2016   Diagnosed Dr.  Patsi Sears; has been treated with MTX, Humira, etc, but made her sick.  Was taking CBD oil and stopped as tested positive in a drug screen for work.    Patient Active Problem List   Diagnosis Date Noted  . Bilateral carpal tunnel syndrome 05/31/2020  . Numbness and tingling of both feet 05/31/2020  . Moderate episode of recurrent major depressive disorder (HCC) 04/29/2020  . Anxiety 04/29/2020  . Panic disorder 04/29/2020  . Agoraphobia 04/29/2020  . Rheumatoid arthritis (HCC) 2016    Past Surgical History:  Procedure Laterality Date  . ABDOMINAL HYSTERECTOMY  2017   Fibroids;  Both ovaries intact still  . BACK SURGERY    . CESAREAN SECTION  2009  . CHOLECYSTECTOMY  2010   laparoscopic  . TONSILLECTOMY       OB History   No obstetric history on file.     Family History  Problem Relation Age of Onset  . Arthritis Mother        Rheumatoid  . Depression Daughter        and anxiety  . Allergies Son     Social History   Tobacco Use  . Smoking status: Never Smoker  . Smokeless tobacco: Never Used  Vaping Use  . Vaping Use: Former  . Substances: CBD  Substance Use Topics  . Alcohol use: Not Currently    Comment: occaional in past  . Drug use: Never    Home Medications Prior to Admission medications   Medication Sig Start Date End Date Taking? Authorizing Provider  levothyroxine (SYNTHROID) 25 MCG tablet 1 tab by mouth daily in morning 1/2 hour before breakfast 07/09/20   Julieanne Manson, MD  potassium chloride (KLOR-CON) 10 MEQ tablet Take 4 tablets (40 mEq total) by mouth daily for 3 days. 09/24/20 09/27/20  Dietrich Pates, PA-C  sertraline (ZOLOFT) 100 MG tablet Take 1 tablet by mouth once daily 09/05/20   Julieanne Manson, MD  zolpidem (AMBIEN) 10 MG tablet 1/2 to 1 tab by mouth at bedtime as needed for sleep Patient not taking: Reported  on 04/29/2020 04/22/20   Julieanne Manson, MD    Allergies    Azithromycin  Review of Systems   Review of Systems  Constitutional: Negative for appetite change, chills and fever.  HENT: Negative for ear pain, rhinorrhea, sneezing and sore throat.        +L sided facial pain  Eyes: Negative for photophobia and visual disturbance.  Respiratory: Negative for cough, chest tightness, shortness of breath and wheezing.   Cardiovascular: Negative for chest pain and palpitations.  Gastrointestinal: Negative for abdominal pain, blood in stool, constipation, diarrhea, nausea and vomiting.  Genitourinary: Negative for dysuria, hematuria and urgency.  Musculoskeletal: Negative for myalgias.  Skin: Negative  for rash.  Neurological: Negative for dizziness, weakness and light-headedness.       +paresthesias throughout body    Physical Exam Updated Vital Signs BP 114/70 (BP Location: Left Arm)   Pulse 80   Temp 98.3 F (36.8 C) (Oral)   Resp 16   SpO2 100%   Physical Exam Vitals and nursing note reviewed.  Constitutional:      General: She is not in acute distress.    Appearance: She is well-developed.  HENT:     Head: Normocephalic and atraumatic.     Nose: Nose normal.  Eyes:     General: No scleral icterus.       Right eye: No discharge.        Left eye: No discharge.     Extraocular Movements: Extraocular movements intact.     Conjunctiva/sclera: Conjunctivae normal.     Pupils: Pupils are equal, round, and reactive to light.  Neck:     Comments: No meningismus. Cardiovascular:     Rate and Rhythm: Regular rhythm. Tachycardia present.     Heart sounds: Normal heart sounds. No murmur heard.  No friction rub. No gallop.   Pulmonary:     Effort: Pulmonary effort is normal. No respiratory distress.     Breath sounds: Normal breath sounds.  Abdominal:     General: Bowel sounds are normal. There is no distension.     Palpations: Abdomen is soft.     Tenderness: There is no abdominal tenderness. There is no guarding.  Musculoskeletal:        General: Normal range of motion.     Cervical back: Normal range of motion and neck supple.  Skin:    General: Skin is warm and dry.     Findings: No rash.     Comments: No rash noted on body.  No urticaria.  Neurological:     General: No focal deficit present.     Mental Status: She is alert and oriented to person, place, and time.     Cranial Nerves: No cranial nerve deficit.     Sensory: No sensory deficit.     Motor: No weakness or abnormal muscle tone.     Coordination: Coordination normal.     Comments: Patient reports "different" sensation to left upper and lower extremities although able to differentiate between sharp and  light touch.  Strength 5/5 in bilateral upper and lower extremities.  No facial asymmetry noted.  Normal sensation of face patient reports paresthesias on both sides of face.     ED Results / Procedures / Treatments   Labs (all labs ordered are listed, but only abnormal results are displayed) Labs Reviewed  COMPREHENSIVE METABOLIC PANEL - Abnormal; Notable for the following components:      Result Value   Sodium 134 (*)  Potassium 3.2 (*)    Glucose, Bld 226 (*)    Creatinine, Ser 1.07 (*)    ALT 49 (*)    All other components within normal limits  CBC WITH DIFFERENTIAL/PLATELET - Abnormal; Notable for the following components:   WBC 13.0 (*)    Hemoglobin 15.5 (*)    Neutro Abs 9.7 (*)    All other components within normal limits  MAGNESIUM    EKG None  Radiology CT Head Wo Contrast  Result Date: 09/24/2020 CLINICAL DATA:  Numbness or tingling, paresthesia. Additional history provided: Facial numbness, tingling, pain in left cheek, arm/hand tingling since yesterday. EXAM: CT HEAD WITHOUT CONTRAST TECHNIQUE: Contiguous axial images were obtained from the base of the skull through the vertex without intravenous contrast. COMPARISON:  Head CT 10/06/2018.  Brain MRI 08/18/2016. FINDINGS: Brain: Cerebral volume is normal. There is no acute intracranial hemorrhage. No demarcated cortical infarct. No extra-axial fluid collection. No evidence of intracranial mass. No midline shift. Vascular: No hyperdense vessel. Skull: Normal. Negative for fracture or focal lesion. Sinuses/Orbits: Visualized orbits show no acute finding. No significant paranasal sinus disease at the imaged levels. IMPRESSION: Unremarkable non-contrast CT appearance of the brain. No evidence of acute intracranial abnormality. Electronically Signed   By: Jackey Loge DO   On: 09/24/2020 11:03    Procedures Procedures (including critical care time)  Medications Ordered in ED Medications  potassium chloride 10 mEq in  100 mL IVPB (10 mEq Intravenous New Bag/Given 09/24/20 1157)  magnesium sulfate IVPB 2 g 50 mL (2 g Intravenous New Bag/Given 09/24/20 1208)  0.9 %  sodium chloride infusion (250 mLs Intravenous New Bag/Given 09/24/20 1157)  sodium chloride 0.9 % bolus 1,000 mL ( Intravenous Rate/Dose Verify 09/24/20 1159)  potassium chloride SA (KLOR-CON) CR tablet 40 mEq (40 mEq Oral Given 09/24/20 1205)  ketorolac (TORADOL) 30 MG/ML injection 30 mg (30 mg Intravenous Given 09/24/20 1209)    ED Course  I have reviewed the triage vital signs and the nursing notes.  Pertinent labs & imaging results that were available during my care of the patient were reviewed by me and considered in my medical decision making (see chart for details).  Clinical Course as of Sep 24 1250  Tue Sep 24, 2020  1111 Suspect could be secondary to steroid use.  WBC(!): 13.0 [HK]  1113 Potassium(!): 3.2 [HK]    Clinical Course User Index [HK] Dietrich Pates, PA-C   MDM Rules/Calculators/A&P                          39 year old female with a past medical history of RA currently on as needed prednisone, anxiety, panic disorder, migraines presenting to the ED with a chief complaint of tingling.  Woke up yesterday with pins and needle sensation throughout her entire body.  Reports upper and lower extremities and face.  She reports itching throughout her entire body as well although does not improve with scratching.  No rash.  No new environmental exposures.  Minimal improvement noted with prednisone and Benadryl.  No history of similar symptoms in the past.  Reports sharp shooting pain around the left side of her face that has been intermittent since yesterday.  No lip or tongue swelling, changes to gait, recent injuries, fevers.  On exam patient without any weakness noted.  No numbness although reports that when I touch her feels different on the left side of her body.  No facial asymmetry noted.  No  aphasia.  No vision changes.  CBC with  leukocytosis of 13 which could be reactive to her steroid use.  CMP with potassium of 3.2 and magnesium of 1.8.  She also has a creatinine of 1.07.  She was given IV fluids, IV potassium and magnesium for improvement of this.  CT of the head without any acute findings.  Her tachycardia has improved with IV fluids.  She has no signs of severe allergic reaction, no signs of angioedema or anaphylaxis. Suspect dehydration versus hypokalemia versus anxiety.  She also may benefit from outpatient neurology work-up for her symptoms if they persist.  This time doubt her symptoms are due to infectious or vascular cause.  She is comfortable with following up outpatient, taking p.o. potassium and returning for worsening symptoms.  All imaging, if done today, including plain films, CT scans, and ultrasounds, independently reviewed by me, and interpretations confirmed via formal radiology reads.  Patient is hemodynamically stable, in NAD, and able to ambulate in the ED. Evaluation does not show pathology that would require ongoing emergent intervention or inpatient treatment. I explained the diagnosis to the patient. Pain has been managed and has no complaints prior to discharge. Patient is comfortable with above plan and is stable for discharge at this time. All questions were answered prior to disposition. Strict return precautions for returning to the ED were discussed. Encouraged follow up with PCP.   An After Visit Summary was printed and given to the patient.   Portions of this note were generated with Scientist, clinical (histocompatibility and immunogenetics). Dictation errors may occur despite best attempts at proofreading.  Final Clinical Impression(s) / ED Diagnoses Final diagnoses:  Paresthesias  Hypokalemia    Rx / DC Orders ED Discharge Orders         Ordered    potassium chloride (KLOR-CON) 10 MEQ tablet  Daily        09/24/20 1233           Dietrich Pates, PA-C 09/24/20 1251    Benjiman Core, MD 09/24/20 1416

## 2020-09-24 NOTE — ED Notes (Signed)
Patient transported to CT 

## 2020-09-26 ENCOUNTER — Encounter (HOSPITAL_BASED_OUTPATIENT_CLINIC_OR_DEPARTMENT_OTHER): Payer: Self-pay

## 2020-09-26 ENCOUNTER — Other Ambulatory Visit: Payer: Self-pay

## 2020-09-26 ENCOUNTER — Emergency Department (HOSPITAL_BASED_OUTPATIENT_CLINIC_OR_DEPARTMENT_OTHER)
Admission: EM | Admit: 2020-09-26 | Discharge: 2020-09-26 | Disposition: A | Discharge status: Home / Self Care | Payer: Self-pay | Attending: Emergency Medicine | Admitting: Emergency Medicine

## 2020-09-26 DIAGNOSIS — J45909 Unspecified asthma, uncomplicated: Secondary | ICD-10-CM | POA: Insufficient documentation

## 2020-09-26 DIAGNOSIS — B029 Zoster without complications: Secondary | ICD-10-CM

## 2020-09-26 DIAGNOSIS — E119 Type 2 diabetes mellitus without complications: Secondary | ICD-10-CM | POA: Insufficient documentation

## 2020-09-26 DIAGNOSIS — R202 Paresthesia of skin: Secondary | ICD-10-CM | POA: Insufficient documentation

## 2020-09-26 MED ORDER — PREDNISONE 50 MG PO TABS
60.0000 mg | ORAL_TABLET | Freq: Once | ORAL | Status: AC
  Administered 2020-09-26: 60 mg via ORAL
  Filled 2020-09-26: qty 1

## 2020-09-26 MED ORDER — PREDNISONE 20 MG PO TABS: ORAL_TABLET | ORAL | 0 refills | Status: DC

## 2020-09-26 MED ORDER — GABAPENTIN 300 MG PO CAPS: ORAL_CAPSULE | ORAL | 0 refills | Status: DC

## 2020-09-26 MED ORDER — VALACYCLOVIR HCL 1 G PO TABS: 1000.0000 mg | ORAL_TABLET | Freq: Three times a day (TID) | ORAL | 0 refills | Status: AC

## 2020-09-26 MED ORDER — OXYCODONE-ACETAMINOPHEN 5-325 MG PO TABS
1.0000 | ORAL_TABLET | Freq: Four times a day (QID) | ORAL | 0 refills | Status: DC | PRN
Start: 2020-09-26 — End: 2020-10-02

## 2020-09-26 NOTE — ED Provider Notes (Signed)
[facility] EMERGENCY DEPARTMENT Provider Note   CSN: [id number] Arrival date & time: 09/26/20  1321     History Chief Complaint  Patient presents with  . Numbness    Merrin Mcvicker is a 39 y.o. female hx of DM, depression, rheumatoid arthritis, here with numbness, rash.  Patient has numbness in the lower face especially on the left side.  Patient states that the pain is burning in sensation.  It is also shooting today.  She came here 2 days ago and had unremarkable CT head and mild hypokalemia of 3.2 and was supplemented.  He states that the numbness has persisted and now she has a rash on the left side.  She states that she had chickenpox as a kid and never had shingles in the past.  She states that she was on rheumatoid arthritis but has not been taking her steroids all the time.  The history is provided by the patient.       Past Medical History:  Diagnosis Date  . Allergic reaction   . Anxiety   . Asthma   . Depression   . DM (diabetes mellitus), gestational   . Migraines   . Rheumatoid arthritis (HCC) 2016   Diagnosed Dr.  Patsi Sears; has been treated with MTX, Humira, etc, but made her sick.  Was taking CBD oil and stopped as tested positive in a drug screen for work.    Patient Active Problem List   Diagnosis Date Noted  . Bilateral carpal tunnel syndrome 05/31/2020  . Numbness and tingling of both feet 05/31/2020  . Moderate episode of recurrent major depressive disorder (HCC) 04/29/2020  . Anxiety 04/29/2020  . Panic disorder 04/29/2020  . Agoraphobia 04/29/2020  . Rheumatoid arthritis (HCC) 2016    Past Surgical History:  Procedure Laterality Date  . ABDOMINAL HYSTERECTOMY  2017   Fibroids; Both ovaries intact still  . BACK SURGERY    . CESAREAN SECTION  2009  . CHOLECYSTECTOMY  2010   laparoscopic  . TONSILLECTOMY       OB History   No obstetric history on file.     Family History  Problem Relation Age of Onset  . Arthritis  Mother        Rheumatoid  . Depression Daughter        and anxiety  . Allergies Son     Social History   Tobacco Use  . Smoking status: Never Smoker  . Smokeless tobacco: Never Used  Vaping Use  . Vaping Use: Former  . Substances: CBD  Substance Use Topics  . Alcohol use: Not Currently  . Drug use: Never    Home Medications Prior to Admission medications   Medication Sig Start Date End Date Taking? Authorizing Provider  levothyroxine (SYNTHROID) 25 MCG tablet 1 tab by mouth daily in morning 1/2 hour before breakfast 07/09/20   Julieanne Manson, MD  potassium chloride (KLOR-CON) 10 MEQ tablet Take 4 tablets (40 mEq total) by mouth daily for 3 days. 09/24/20 09/27/20  Dietrich Pates, PA-C  sertraline (ZOLOFT) 100 MG tablet Take 1 tablet by mouth once daily 09/05/20   Julieanne Manson, MD  zolpidem (AMBIEN) 10 MG tablet 1/2 to 1 tab by mouth at bedtime as needed for sleep Patient not taking: Reported on 04/29/2020 04/22/20   Julieanne Manson, MD    Allergies    Azithromycin  Review of Systems   Review of Systems  Skin: Positive for rash.  All other systems reviewed and are negative.  Physical Exam Updated Vital Signs BP (!) 126/92 (BP Location: Left Arm)   Pulse (!) 110   Temp 98.5 F (36.9 C) (Oral)   Resp 18   Ht 5\' 3"  (1.6 m)   Wt 105.2 kg   SpO2 99%   BMI 41.10 kg/m   Physical Exam Vitals and nursing note reviewed.  Constitutional:      Appearance: Normal appearance.  HENT:     Head: Normocephalic.     Nose: Nose normal.     Mouth/Throat:     Mouth: Mucous membranes are moist.  Eyes:     Extraocular Movements: Extraocular movements intact.     Pupils: Pupils are equal, round, and reactive to light.  Cardiovascular:     Rate and Rhythm: Normal rate and regular rhythm.     Pulses: Normal pulses.     Heart sounds: Normal heart sounds.  Pulmonary:     Effort: Pulmonary effort is normal.     Breath sounds: Normal breath sounds.  Abdominal:      General: Abdomen is flat.     Palpations: Abdomen is soft.  Musculoskeletal:        General: Normal range of motion.     Cervical back: Normal range of motion and neck supple.  Skin:    General: Skin is warm.  Neurological:     General: No focal deficit present.     Mental Status: She is alert and oriented to person, place, and time.     Comments: Patient has vesicles on the left side of her face with open wounds in the V3 distribution.  No obvious vesicles in the ear and there is no eye involvement.  There is several vesicles on the right side of the face close the mouth.  Psychiatric:        Mood and Affect: Mood normal.        Behavior: Behavior normal.     ED Results / Procedures / Treatments   Labs (all labs ordered are listed, but only abnormal results are displayed) Labs Reviewed - No data to display  EKG None  Radiology No results found.  Procedures Procedures (including critical care time)  Medications Ordered in ED Medications  predniSONE (DELTASONE) tablet 60 mg (has no administration in time range)    ED Course  I have reviewed the triage vital signs and the nursing notes.  Pertinent labs & imaging results that were available during my care of the patient were reviewed by me and considered in my medical decision making (see chart for details).    MDM Rules/Calculators/A&P                         Phylis Javed is a 38 y.o. female here presenting with paresthesias.  Patient came several days ago with left facial numbness. Now the numbness has gotten worse and now I see a rash.  I think is likely shingles in the V3 distribution.  Will discharge home with a course of prednisone and Valtrex and will also prescribe pain medicine and gabapentin.   Final Clinical Impression(s) / ED Diagnoses Final diagnoses:  None    Rx / DC Orders ED Discharge Orders    None       30, MD 09/26/20 1544

## 2020-09-26 NOTE — Discharge Instructions (Signed)
You have shingles   Take prednisone as prescribed   Take valtrex as well   Take percocet for pain. Take gabapentin for numbness   See your doctor for follow up   Return to ER if you have worse numbness, weakness, trouble speaking

## 2020-09-26 NOTE — ED Triage Notes (Addendum)
Pt c/o numbness to face, bilat UE, bilat LE-states she was seen here for same 2 days ago-concerned cont's sx and increase rash to face-NAD-steady gait

## 2020-10-02 ENCOUNTER — Emergency Department (HOSPITAL_BASED_OUTPATIENT_CLINIC_OR_DEPARTMENT_OTHER)
Admission: EM | Admit: 2020-10-02 | Discharge: 2020-10-02 | Disposition: A | Discharge status: Home / Self Care | Payer: Medicaid Other | Attending: Emergency Medicine | Admitting: Emergency Medicine

## 2020-10-02 ENCOUNTER — Other Ambulatory Visit: Payer: Self-pay

## 2020-10-02 ENCOUNTER — Encounter (HOSPITAL_BASED_OUTPATIENT_CLINIC_OR_DEPARTMENT_OTHER): Payer: Self-pay

## 2020-10-02 DIAGNOSIS — J45909 Unspecified asthma, uncomplicated: Secondary | ICD-10-CM | POA: Insufficient documentation

## 2020-10-02 DIAGNOSIS — R21 Rash and other nonspecific skin eruption: Secondary | ICD-10-CM

## 2020-10-02 MED ORDER — OXYCODONE-ACETAMINOPHEN 5-325 MG PO TABS
1.0000 | ORAL_TABLET | Freq: Three times a day (TID) | ORAL | 0 refills | Status: DC | PRN
Start: 2020-10-02 — End: 2021-01-15

## 2020-10-02 MED ORDER — GABAPENTIN 400 MG PO CAPS: 400.0000 mg | ORAL_CAPSULE | Freq: Three times a day (TID) | ORAL | 0 refills | Status: DC

## 2020-10-02 NOTE — ED Triage Notes (Addendum)
pt arrives with c/o rash to entire body bilaterally, reports it started with tingling and burning of the skin about a week ago.

## 2020-10-02 NOTE — ED Provider Notes (Signed)
[facility] EMERGENCY DEPARTMENT Provider Note   CSN: [id number] Arrival date & time: 10/02/20  0753     History Chief Complaint  Patient presents with  . Rash    Erika Rhodes is a 39 y.o. female.  Patient currently on steroids, acyclovir, gabapentin for presumed shingles.  Still having some burning pain in the face.  Overall it appears that rash is improving but has had some rash on the chest now as well.  Mostly having burning pain.  Is now on 300 of gabapentin 3 times a day.  The history is provided by the patient.  Rash Location:  Face and torso Facial rash location:  Face Torso rash location: chest. Quality: burning   Onset quality:  Gradual Timing:  Constant Chronicity:  New Relieved by:  Nothing Worsened by:  Nothing Associated symptoms: no fever        Past Medical History:  Diagnosis Date  . Allergic reaction   . Anxiety   . Asthma   . Depression   . DM (diabetes mellitus), gestational   . Migraines   . Rheumatoid arthritis (HCC) 2016   Diagnosed Dr.  Patsi Sears; has been treated with MTX, Humira, etc, but made her sick.  Was taking CBD oil and stopped as tested positive in a drug screen for work.    Patient Active Problem List   Diagnosis Date Noted  . Bilateral carpal tunnel syndrome 05/31/2020  . Numbness and tingling of both feet 05/31/2020  . Moderate episode of recurrent major depressive disorder (HCC) 04/29/2020  . Anxiety 04/29/2020  . Panic disorder 04/29/2020  . Agoraphobia 04/29/2020  . Rheumatoid arthritis (HCC) 2016    Past Surgical History:  Procedure Laterality Date  . ABDOMINAL HYSTERECTOMY  2017   Fibroids; Both ovaries intact still  . BACK SURGERY    . CESAREAN SECTION  2009  . CHOLECYSTECTOMY  2010   laparoscopic  . TONSILLECTOMY       OB History   No obstetric history on file.     Family History  Problem Relation Age of Onset  . Arthritis Mother        Rheumatoid  . Depression Daughter         and anxiety  . Allergies Son     Social History   Tobacco Use  . Smoking status: Never Smoker  . Smokeless tobacco: Never Used  Vaping Use  . Vaping Use: Former  . Substances: CBD  Substance Use Topics  . Alcohol use: Not Currently  . Drug use: Never    Home Medications Prior to Admission medications   Medication Sig Start Date End Date Taking? Authorizing Provider  gabapentin (NEURONTIN) 400 MG capsule Take 1 capsule (400 mg total) by mouth 3 (three) times daily for 7 days. 10/02/20 10/09/20  Virgina Norfolk, DO  levothyroxine (SYNTHROID) 25 MCG tablet 1 tab by mouth daily in morning 1/2 hour before breakfast 07/09/20   Julieanne Manson, MD  oxyCODONE-acetaminophen (PERCOCET) 5-325 MG tablet Take 1 tablet by mouth every 8 (eight) hours as needed for up to 10 doses for severe pain. 10/02/20   Nyliah Nierenberg, DO  potassium chloride (KLOR-CON) 10 MEQ tablet Take 4 tablets (40 mEq total) by mouth daily for 3 days. 09/24/20 09/27/20  Khatri, Hillary Bow, PA-C  predniSONE (DELTASONE) 20 MG tablet Take 60 mg daily x 2 days then 40 mg daily x 2 days then 20 mg daily x 2 days 09/26/20   Charlynne Pander, MD  sertraline (ZOLOFT) 100  MG tablet Take 1 tablet by mouth once daily 09/05/20   Julieanne Manson, MD  valACYclovir (VALTREX) 1000 MG tablet Take 1 tablet (1,000 mg total) by mouth 3 (three) times daily for 7 days. 09/26/20 10/03/20  Charlynne Pander, MD  zolpidem (AMBIEN) 10 MG tablet 1/2 to 1 tab by mouth at bedtime as needed for sleep Patient not taking: Reported on 04/29/2020 04/22/20   Julieanne Manson, MD    Allergies    Azithromycin  Review of Systems   Review of Systems  Constitutional: Negative for fever.  Skin: Positive for rash. Negative for color change, pallor and wound.  Neurological: Negative for weakness and numbness.    Physical Exam Updated Vital Signs  ED Triage Vitals  Enc Vitals Group     BP 10/02/20 0758 (!) 137/100     Pulse Rate 10/02/20 0758 99      Resp 10/02/20 0758 18     Temp 10/02/20 0758 98.3 F (36.8 C)     Temp Source 10/02/20 0758 Oral     SpO2 10/02/20 0758 99 %     Weight 10/02/20 0758 220 lb (99.8 kg)     Height 10/02/20 0758 5' 3.5" (1.613 m)     Head Circumference --      Peak Flow --      Pain Score 10/02/20 0810 9     Pain Loc --      Pain Edu? --      Excl. in GC? --     Physical Exam Vitals and nursing note reviewed.  Constitutional:      General: She is not in acute distress.    Appearance: She is well-developed.  HENT:     Head: Normocephalic and atraumatic.  Eyes:     Conjunctiva/sclera: Conjunctivae normal.  Cardiovascular:     Rate and Rhythm: Normal rate and regular rhythm.     Heart sounds: No murmur heard.   Pulmonary:     Effort: Pulmonary effort is normal. No respiratory distress.     Breath sounds: Normal breath sounds.  Abdominal:     Palpations: Abdomen is soft.     Tenderness: There is no abdominal tenderness.  Musculoskeletal:     Cervical back: Neck supple.  Skin:    General: Skin is warm and dry.     Comments: Vesicular type rash throughout face and torso that crosses midline no ocular involvement  Neurological:     Mental Status: She is alert.     ED Results / Procedures / Treatments   Labs (all labs ordered are listed, but only abnormal results are displayed) Labs Reviewed - No data to display  EKG None  Radiology No results found.  Procedures Procedures (including critical care time)  Medications Ordered in ED Medications - No data to display  ED Course  I have reviewed the triage vital signs and the nursing notes.  Pertinent labs & imaging results that were available during my care of the patient were reviewed by me and considered in my medical decision making (see chart for details).    MDM Rules/Calculators/A&P                          Erika Rhodes is here for evaluation of rash.  Is on medication for shingles but rash does not appear consistent with  shingles as it does cross the midline in both her face and her chest.  Probably some type of other herpes  virus.  Will increase her gabapentin and give her several more narcotic pain medicine.  She has follow-up with primary care arranged and may need referral to dermatology.  No signs of overwhelming cellulitis.  Possibly a folliculitis.  Given return precautions and discharged in ED in good condition.  No concern for disseminated process. Normal vitals.  Final Clinical Impression(s) / ED Diagnoses Final diagnoses:  Rash    Rx / DC Orders ED Discharge Orders         Ordered    gabapentin (NEURONTIN) 400 MG capsule  3 times daily        10/02/20 0816    oxyCODONE-acetaminophen (PERCOCET) 5-325 MG tablet  Every 8 hours PRN        10/02/20 0816           Virgina Norfolk, DO 10/02/20 [id number]

## 2020-10-23 ENCOUNTER — Ambulatory Visit: Payer: Self-pay | Admitting: Internal Medicine

## 2020-11-29 ENCOUNTER — Emergency Department (HOSPITAL_BASED_OUTPATIENT_CLINIC_OR_DEPARTMENT_OTHER)
Admission: EM | Admit: 2020-11-29 | Discharge: 2020-11-29 | Disposition: A | Discharge status: Home / Self Care | Payer: Self-pay | Attending: Emergency Medicine | Admitting: Emergency Medicine

## 2020-11-29 ENCOUNTER — Emergency Department (HOSPITAL_BASED_OUTPATIENT_CLINIC_OR_DEPARTMENT_OTHER): Payer: Self-pay

## 2020-11-29 ENCOUNTER — Other Ambulatory Visit: Payer: Self-pay

## 2020-11-29 ENCOUNTER — Encounter (HOSPITAL_BASED_OUTPATIENT_CLINIC_OR_DEPARTMENT_OTHER): Payer: Self-pay | Admitting: *Deleted

## 2020-11-29 DIAGNOSIS — R110 Nausea: Secondary | ICD-10-CM | POA: Insufficient documentation

## 2020-11-29 DIAGNOSIS — M549 Dorsalgia, unspecified: Secondary | ICD-10-CM | POA: Insufficient documentation

## 2020-11-29 DIAGNOSIS — J45909 Unspecified asthma, uncomplicated: Secondary | ICD-10-CM | POA: Insufficient documentation

## 2020-11-29 DIAGNOSIS — R109 Unspecified abdominal pain: Secondary | ICD-10-CM

## 2020-11-29 DIAGNOSIS — R10A0 Flank pain, unspecified side: Secondary | ICD-10-CM

## 2020-11-29 DIAGNOSIS — R102 Pelvic and perineal pain unspecified side: Secondary | ICD-10-CM

## 2020-11-29 DIAGNOSIS — Z90710 Acquired absence of both cervix and uterus: Secondary | ICD-10-CM | POA: Insufficient documentation

## 2020-11-29 DIAGNOSIS — R000 Tachycardia, unspecified: Secondary | ICD-10-CM | POA: Insufficient documentation

## 2020-11-29 LAB — CBC WITH DIFFERENTIAL/PLATELET
Abs Immature Granulocytes: 0.04 10*3/uL (ref 0.00–0.07)
Basophils Absolute: 0 10*3/uL (ref 0.0–0.1)
Basophils Relative: 1 %
Eosinophils Absolute: 0.1 10*3/uL (ref 0.0–0.5)
Eosinophils Relative: 2 %
HCT: 40.8 % (ref 36.0–46.0)
Hemoglobin: 14.4 g/dL (ref 12.0–15.0)
Immature Granulocytes: 1 %
Lymphocytes Relative: 25 %
Lymphs Abs: 1.5 10*3/uL (ref 0.7–4.0)
MCH: 31.4 pg (ref 26.0–34.0)
MCHC: 35.3 g/dL (ref 30.0–36.0)
MCV: 88.9 fL (ref 80.0–100.0)
Monocytes Absolute: 0.3 10*3/uL (ref 0.1–1.0)
Monocytes Relative: 6 %
Neutro Abs: 3.9 10*3/uL (ref 1.7–7.7)
Neutrophils Relative %: 65 %
Platelets: 237 10*3/uL (ref 150–400)
RBC: 4.59 MIL/uL (ref 3.87–5.11)
RDW: 12.5 % (ref 11.5–15.5)
WBC: 5.8 10*3/uL (ref 4.0–10.5)
nRBC: 0 % (ref 0.0–0.2)

## 2020-11-29 LAB — BASIC METABOLIC PANEL
Anion gap: 11 (ref 5–15)
BUN: 14 mg/dL (ref 6–20)
CO2: 22 mmol/L (ref 22–32)
Calcium: 8.8 mg/dL — ABNORMAL LOW (ref 8.9–10.3)
Chloride: 99 mmol/L (ref 98–111)
Creatinine, Ser: 0.75 mg/dL (ref 0.44–1.00)
GFR, Estimated: >60 mL/min (ref >60)
Glucose, Bld: 411 mg/dL — ABNORMAL HIGH (ref 70–99)
Potassium: 3.6 mmol/L (ref 3.5–5.1)
Sodium: 132 mmol/L — ABNORMAL LOW (ref 135–145)

## 2020-11-29 LAB — URINALYSIS, ROUTINE W REFLEX MICROSCOPIC
Bilirubin Urine: NEGATIVE
Glucose, UA: >=500 mg/dL — AB
Hgb urine dipstick: NEGATIVE
Ketones, ur: NEGATIVE mg/dL
Leukocytes,Ua: NEGATIVE
Nitrite: NEGATIVE
Protein, ur: NEGATIVE mg/dL
Specific Gravity, Urine: 1.02 (ref 1.005–1.030)
pH: 5 (ref 5.0–8.0)

## 2020-11-29 LAB — URINALYSIS, MICROSCOPIC (REFLEX)

## 2020-11-29 MED ORDER — IOHEXOL 300 MG/ML  SOLN
100.0000 mL | Freq: Once | INTRAMUSCULAR | Status: AC | PRN
  Administered 2020-11-29: 100 mL via INTRAVENOUS

## 2020-11-29 MED ORDER — ONDANSETRON 4 MG PO TBDP: 4.0000 mg | ORAL_TABLET | Freq: Three times a day (TID) | ORAL | 0 refills | Status: DC | PRN

## 2020-11-29 MED ORDER — IBUPROFEN 800 MG PO TABS
800.0000 mg | ORAL_TABLET | Freq: Once | ORAL | Status: AC
  Administered 2020-11-29: 800 mg via ORAL
  Filled 2020-11-29: qty 1

## 2020-11-29 MED ORDER — ONDANSETRON 4 MG PO TBDP
4.0000 mg | ORAL_TABLET | Freq: Once | ORAL | Status: AC
  Administered 2020-11-29: 4 mg via ORAL
  Filled 2020-11-29: qty 1

## 2020-11-29 NOTE — Discharge Instructions (Signed)
Recommend follow-up with your primary doctor for recheck next week.  Take Zofran as needed for nausea.  If you have uncontrolled vomiting, fever, worsening pain, return to ER.

## 2020-11-29 NOTE — ED Notes (Signed)
Patient transported to CT 

## 2020-11-29 NOTE — ED Provider Notes (Signed)
Signout note  40 year old lady with concern for groin plain, flank pain, abdominal pain and nausea.  Labs grossly stable, pelvic ultrasound negative.  CT ordered to further evaluate.  Received sign out from Neita Garnet MD  5:34 PM CT negative for acute abdominal pelvic pathology.  Reassessed patient, nausea has resolved and has no ongoing pain at present.  Believe she is appropriate for discharge and outpatient management.  Reviewed results of her lab work, noted hyperglycemia, patient states that she had gestational diabetes and has had elevated sugars due to steroid use and recently have the steroids for her rheumatoid arthritis.  Strongly recommended she follow-up with her doctor for monitoring of her sugars, check A1c.   Milagros Loll, MD 11/29/20 [id number]

## 2020-11-29 NOTE — ED Triage Notes (Signed)
Right flank pain x 2 days. Pain is dull but worse at times. Hx of kidney stones.

## 2020-11-29 NOTE — ED Notes (Signed)
Pt returned from US, NAD

## 2020-11-29 NOTE — ED Provider Notes (Signed)
[facility] EMERGENCY DEPARTMENT Provider Note   CSN: [id number] Arrival date & time: 11/29/20  1217     History Chief Complaint  Patient presents with  . Flank Pain    Erika Rhodes is a 40 y.o. female.  Patient reports to ED with complaint of right-sided groin, flank and back pain. She describes the pain as having episodes of increased intensity but is constantly present and more dull at times. Patient reports that this pain is similar to when she had hydronephrosis and kidney stone in the past.  Patient reports severe hydronephrosis 13 years ago for which she had to have renal stents placed on the affected side. She reports that she does not follow with her urologist currently.  Patient denies any fevers or chills.  She does report some nausea.  She has no difficulty breathing no chest pain.  She states that she has no dysuria or increased frequency/urinary urgency.  She has not noticed any change in her urine.  Patient had hysterectomy in 2018 for heavy bleeding due to uterine fibroids and no longer has menses.  She reports that she also had her fallopian tubes removed and still has bilateral ovaries.  She reports that she was told she had a ovarian cyst in the past. Patient is currently sexually active with monogamous female partner. Patient has had gallbladder removed, she still has appendix.         Past Medical History:  Diagnosis Date  . Allergic reaction   . Anxiety   . Asthma   . Depression   . DM (diabetes mellitus), gestational   . Migraines   . Rheumatoid arthritis (HCC) 2016   Diagnosed Dr.  Patsi Sears; has been treated with MTX, Humira, etc, but made her sick.  Was taking CBD oil and stopped as tested positive in a drug screen for work.    Patient Active Problem List   Diagnosis Date Noted  . Bilateral carpal tunnel syndrome 05/31/2020  . Numbness and tingling of both feet 05/31/2020  . Moderate episode of recurrent major depressive disorder (HCC)  04/29/2020  . Anxiety 04/29/2020  . Panic disorder 04/29/2020  . Agoraphobia 04/29/2020  . Rheumatoid arthritis (HCC) 2016    Past Surgical History:  Procedure Laterality Date  . ABDOMINAL HYSTERECTOMY  2017   Fibroids; Both ovaries intact still  . BACK SURGERY    . CESAREAN SECTION  2009  . CHOLECYSTECTOMY  2010   laparoscopic  . TONSILLECTOMY       OB History   No obstetric history on file.     Family History  Problem Relation Age of Onset  . Arthritis Mother        Rheumatoid  . Depression Daughter        and anxiety  . Allergies Son     Social History   Tobacco Use  . Smoking status: Never Smoker  . Smokeless tobacco: Never Used  Vaping Use  . Vaping Use: Former  . Substances: CBD  Substance Use Topics  . Alcohol use: Not Currently  . Drug use: Never    Home Medications Prior to Admission medications   Medication Sig Start Date End Date Taking? Authorizing Provider  levothyroxine (SYNTHROID) 25 MCG tablet 1 tab by mouth daily in morning 1/2 hour before breakfast 07/09/20  Yes Julieanne Manson, MD  sertraline (ZOLOFT) 100 MG tablet Take 1 tablet by mouth once daily 09/05/20  Yes Julieanne Manson, MD  gabapentin (NEURONTIN) 400 MG capsule Take 1 capsule (400  mg total) by mouth 3 (three) times daily for 7 days. 10/02/20 10/09/20  Curatolo, Adam, DO  oxyCODONE-acetaminophen (PERCOCET) 5-325 MG tablet Take 1 tablet by mouth every 8 (eight) hours as needed for up to 10 doses for severe pain. 10/02/20   Curatolo, Adam, DO  potassium chloride (KLOR-CON) 10 MEQ tablet Take 4 tablets (40 mEq total) by mouth daily for 3 days. 09/24/20 09/27/20  Khatri, Hillary Bow, PA-C  predniSONE (DELTASONE) 20 MG tablet Take 60 mg daily x 2 days then 40 mg daily x 2 days then 20 mg daily x 2 days 09/26/20   Charlynne Pander, MD  zolpidem (AMBIEN) 10 MG tablet 1/2 to 1 tab by mouth at bedtime as needed for sleep Patient not taking: No sig reported 04/22/20   Julieanne Manson, MD     Allergies    Azithromycin  Review of Systems   Review of Systems  Gastrointestinal: Positive for nausea. Negative for abdominal pain.  Genitourinary: Positive for flank pain. Negative for dysuria, frequency, menstrual problem, pelvic pain, urgency, vaginal bleeding, vaginal discharge and vaginal pain.       Groin pain   Musculoskeletal: Positive for back pain.    Physical Exam Updated Vital Signs BP (!) 112/94 (BP Location: Left Arm)   Pulse 77   Temp 98.2 F (36.8 C) (Oral)   Resp 18   Ht 5\' 4"  (1.626 m)   Wt 103.4 kg   SpO2 92%   BMI 39.14 kg/m   Physical Exam Vitals reviewed.  Constitutional:      General: She is not in acute distress.    Appearance: She is obese. She is not toxic-appearing.  Cardiovascular:     Rate and Rhythm: Regular rhythm. Tachycardia present.     Pulses: Normal pulses.     Heart sounds: Normal heart sounds.  Pulmonary:     Effort: Pulmonary effort is normal. No respiratory distress.     Breath sounds: Normal breath sounds.  Abdominal:     General: Bowel sounds are normal.     Palpations: Abdomen is soft.     Tenderness: There is abdominal tenderness in the right lower quadrant. There is no right CVA tenderness or left CVA tenderness. Negative signs include Rovsing's sign and McBurney's sign.  Neurological:     Mental Status: She is alert.     ED Results / Procedures / Treatments   Labs (all labs ordered are listed, but only abnormal results are displayed) Labs Reviewed  URINALYSIS, ROUTINE W REFLEX MICROSCOPIC - Abnormal; Notable for the following components:      Result Value   Glucose, UA >=500 (*)    All other components within normal limits  URINALYSIS, MICROSCOPIC (REFLEX) - Abnormal; Notable for the following components:   Bacteria, UA RARE (*)    All other components within normal limits  BASIC METABOLIC PANEL - Abnormal; Notable for the following components:   Sodium 132 (*)    Glucose, Bld 411 (*)    Calcium 8.8 (*)     All other components within normal limits  CBC WITH DIFFERENTIAL/PLATELET    EKG None  Radiology PELVIC COMPLETE WITH TRANSVAGINAL  Result Date: 11/29/2020 CLINICAL DATA:  Right inguinal pain, hysterectomy EXAM: TRANSABDOMINAL AND TRANSVAGINAL ULTRASOUND OF PELVIS TECHNIQUE: Both transabdominal and transvaginal ultrasound examinations of the pelvis were performed. Transabdominal technique was performed for global imaging of the pelvis including uterus, ovaries, adnexal regions, and pelvic cul-de-sac. It was necessary to proceed with endovaginal exam following the transabdominal exam  to visualize the adnexal structures. COMPARISON:  None FINDINGS: Uterus Surgically absent. Right ovary Not visualized.  No adnexal mass. Left ovary Not visualized.  No adnexal mass. Other findings No free fluid. Bladder is moderately distended without gross abnormality. IMPRESSION: 1. Nonvisualization of the ovaries.  Previous hysterectomy. 2. Unremarkable pelvic ultrasound. Electronically Signed   By: Sharlet Salina M.D.   On: 11/29/2020 15:01    Procedures Procedures (including critical care time)  Medications Ordered in ED Medications  ibuprofen (ADVIL) tablet 800 mg (800 mg Oral Given 11/29/20 1338)    ED Course  I have reviewed the triage vital signs and the nursing notes.  Pertinent labs & imaging results that were available during my care of the patient were reviewed by me and considered in my medical decision making (see chart for details).    MDM Rules/Calculators/A&P                          40 y.o. female presenting with RLQ pain that could be due to right ovarian cyst, considering nephrolithiasis or pyelonephritis given patient's extensive renal hx requiring stents for hydronephrosis, also could consider appendicitis given location although lower suspicion for this given negative McBurney's point tenderness. U/A significant results without leukocytes, nitrites and with rare bacteria Pelvic  ultrasound unable to visualize ovaries and no other abnormalities noted. Will order abdominal CT with contrast to evaluate for appendicitis vs  Nephrolithiasis. Case discussed with oncoming physician for CT follow up.    Final Clinical Impression(s) / ED Diagnoses Final diagnoses:  Pelvic pain    Rx / DC Orders ED Discharge Orders    None       Simmons-Robinson, Tawanna Cooler, MD 11/29/20 1528    Blane Ohara, MD 11/30/20 1733

## 2021-01-11 ENCOUNTER — Encounter (HOSPITAL_COMMUNITY): Payer: Self-pay

## 2021-01-11 ENCOUNTER — Other Ambulatory Visit: Payer: Self-pay

## 2021-01-11 ENCOUNTER — Emergency Department (HOSPITAL_COMMUNITY)
Admission: EM | Admit: 2021-01-11 | Discharge: 2021-01-12 | Disposition: A | Discharge status: Home / Self Care | Payer: Medicaid Other | Attending: Emergency Medicine | Admitting: Emergency Medicine

## 2021-01-11 DIAGNOSIS — R45851 Suicidal ideations: Secondary | ICD-10-CM | POA: Insufficient documentation

## 2021-01-11 DIAGNOSIS — Z79899 Other long term (current) drug therapy: Secondary | ICD-10-CM | POA: Insufficient documentation

## 2021-01-11 DIAGNOSIS — F411 Generalized anxiety disorder: Secondary | ICD-10-CM | POA: Insufficient documentation

## 2021-01-11 DIAGNOSIS — F32A Depression, unspecified: Secondary | ICD-10-CM

## 2021-01-11 DIAGNOSIS — F332 Major depressive disorder, recurrent severe without psychotic features: Secondary | ICD-10-CM | POA: Insufficient documentation

## 2021-01-11 DIAGNOSIS — F4310 Post-traumatic stress disorder, unspecified: Secondary | ICD-10-CM | POA: Insufficient documentation

## 2021-01-11 DIAGNOSIS — Z20822 Contact with and (suspected) exposure to covid-19: Secondary | ICD-10-CM | POA: Insufficient documentation

## 2021-01-11 LAB — ETHANOL: Alcohol, Ethyl (B): <10 mg/dL (ref <10)

## 2021-01-11 LAB — BASIC METABOLIC PANEL
Anion gap: 11 (ref 5–15)
BUN: 12 mg/dL (ref 6–20)
CO2: 23 mmol/L (ref 22–32)
Calcium: 9.3 mg/dL (ref 8.9–10.3)
Chloride: 101 mmol/L (ref 98–111)
Creatinine, Ser: 0.69 mg/dL (ref 0.44–1.00)
GFR, Estimated: >60 mL/min (ref >60)
Glucose, Bld: 241 mg/dL — ABNORMAL HIGH (ref 70–99)
Potassium: 3.4 mmol/L — ABNORMAL LOW (ref 3.5–5.1)
Sodium: 135 mmol/L (ref 135–145)

## 2021-01-11 LAB — RESP PANEL BY RT-PCR (FLU A&B, COVID) ARPGX2
Influenza A by PCR: NEGATIVE
Influenza B by PCR: NEGATIVE
SARS Coronavirus 2 by RT PCR: NEGATIVE

## 2021-01-11 LAB — CBC WITH DIFFERENTIAL/PLATELET
Abs Immature Granulocytes: 0.01 10*3/uL (ref 0.00–0.07)
Basophils Absolute: 0 10*3/uL (ref 0.0–0.1)
Basophils Relative: 0 %
Eosinophils Absolute: 0.1 10*3/uL (ref 0.0–0.5)
Eosinophils Relative: 1 %
HCT: 47.6 % — ABNORMAL HIGH (ref 36.0–46.0)
Hemoglobin: 16.7 g/dL — ABNORMAL HIGH (ref 12.0–15.0)
Immature Granulocytes: 0 %
Lymphocytes Relative: 24 %
Lymphs Abs: 2.1 10*3/uL (ref 0.7–4.0)
MCH: 31 pg (ref 26.0–34.0)
MCHC: 35.1 g/dL (ref 30.0–36.0)
MCV: 88.3 fL (ref 80.0–100.0)
Monocytes Absolute: 0.6 10*3/uL (ref 0.1–1.0)
Monocytes Relative: 7 %
Neutro Abs: 5.9 10*3/uL (ref 1.7–7.7)
Neutrophils Relative %: 68 %
Platelets: 331 10*3/uL (ref 150–400)
RBC: 5.39 MIL/uL — ABNORMAL HIGH (ref 3.87–5.11)
RDW: 11.8 % (ref 11.5–15.5)
WBC: 8.8 10*3/uL (ref 4.0–10.5)
nRBC: 0 % (ref 0.0–0.2)

## 2021-01-11 LAB — RAPID URINE DRUG SCREEN, HOSP PERFORMED
Amphetamines: NOT DETECTED
Barbiturates: NOT DETECTED
Benzodiazepines: NOT DETECTED
Cocaine: NOT DETECTED
Opiates: NOT DETECTED
Tetrahydrocannabinol: POSITIVE — AB

## 2021-01-11 LAB — ACETAMINOPHEN LEVEL: Acetaminophen (Tylenol), Serum: <10 ug/mL — ABNORMAL LOW (ref 10–30)

## 2021-01-11 LAB — SALICYLATE LEVEL: Salicylate Lvl: <7 mg/dL — ABNORMAL LOW (ref 7.0–30.0)

## 2021-01-11 MED ORDER — SERTRALINE HCL 50 MG PO TABS
100.0000 mg | ORAL_TABLET | Freq: Every day | ORAL | Status: DC
  Filled 2021-01-11: qty 2

## 2021-01-11 MED ORDER — LEVOTHYROXINE SODIUM 25 MCG PO TABS
25.0000 ug | ORAL_TABLET | Freq: Every day | ORAL | Status: DC
  Administered 2021-01-12: 25 ug via ORAL
  Filled 2021-01-11: qty 1

## 2021-01-11 MED ORDER — LORAZEPAM 1 MG PO TABS
1.0000 mg | ORAL_TABLET | Freq: Once | ORAL | Status: AC
  Administered 2021-01-11: 1 mg via ORAL
  Filled 2021-01-11: qty 1

## 2021-01-11 NOTE — ED Triage Notes (Signed)
Pt reports SI over the last few weeks. Pt reports having multiple plans of how she would harm herself. Pt denies HI.
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
## 2021-01-12 ENCOUNTER — Encounter (HOSPITAL_COMMUNITY): Payer: Self-pay | Admitting: Psychiatry

## 2021-01-12 ENCOUNTER — Ambulatory Visit (HOSPITAL_COMMUNITY)
Admission: EM | Admit: 2021-01-12 | Discharge: 2021-01-12 | Discharge status: Absent without leave | Payer: Federal, State, Local not specified - Other

## 2021-01-12 ENCOUNTER — Inpatient Hospital Stay (HOSPITAL_COMMUNITY)
Admission: AD | Admit: 2021-01-12 | Discharge: 2021-01-15 | DRG: 885 | Disposition: A | Discharge status: Home / Self Care | Payer: Federal, State, Local not specified - Other | Source: Intra-hospital | Attending: Psychiatry | Admitting: Psychiatry

## 2021-01-12 DIAGNOSIS — R45851 Suicidal ideations: Secondary | ICD-10-CM | POA: Diagnosis present

## 2021-01-12 DIAGNOSIS — F900 Attention-deficit hyperactivity disorder, predominantly inattentive type: Secondary | ICD-10-CM | POA: Diagnosis present

## 2021-01-12 DIAGNOSIS — F419 Anxiety disorder, unspecified: Secondary | ICD-10-CM | POA: Diagnosis present

## 2021-01-12 DIAGNOSIS — Z79899 Other long term (current) drug therapy: Secondary | ICD-10-CM | POA: Diagnosis not present

## 2021-01-12 DIAGNOSIS — Z79890 Hormone replacement therapy (postmenopausal): Secondary | ICD-10-CM | POA: Diagnosis not present

## 2021-01-12 DIAGNOSIS — E039 Hypothyroidism, unspecified: Secondary | ICD-10-CM | POA: Diagnosis present

## 2021-01-12 DIAGNOSIS — M069 Rheumatoid arthritis, unspecified: Secondary | ICD-10-CM | POA: Diagnosis present

## 2021-01-12 DIAGNOSIS — Z9049 Acquired absence of other specified parts of digestive tract: Secondary | ICD-10-CM | POA: Diagnosis not present

## 2021-01-12 DIAGNOSIS — Z90710 Acquired absence of both cervix and uterus: Secondary | ICD-10-CM | POA: Diagnosis not present

## 2021-01-12 DIAGNOSIS — G4700 Insomnia, unspecified: Secondary | ICD-10-CM | POA: Diagnosis present

## 2021-01-12 DIAGNOSIS — Z9151 Personal history of suicidal behavior: Secondary | ICD-10-CM

## 2021-01-12 DIAGNOSIS — Z653 Problems related to other legal circumstances: Secondary | ICD-10-CM | POA: Diagnosis not present

## 2021-01-12 DIAGNOSIS — J45909 Unspecified asthma, uncomplicated: Secondary | ICD-10-CM | POA: Diagnosis present

## 2021-01-12 DIAGNOSIS — F4310 Post-traumatic stress disorder, unspecified: Secondary | ICD-10-CM | POA: Diagnosis present

## 2021-01-12 DIAGNOSIS — Z8261 Family history of arthritis: Secondary | ICD-10-CM | POA: Diagnosis not present

## 2021-01-12 DIAGNOSIS — F1220 Cannabis dependence, uncomplicated: Secondary | ICD-10-CM | POA: Diagnosis present

## 2021-01-12 DIAGNOSIS — G43109 Migraine with aura, not intractable, without status migrainosus: Secondary | ICD-10-CM | POA: Diagnosis present

## 2021-01-12 DIAGNOSIS — Z8632 Personal history of gestational diabetes: Secondary | ICD-10-CM | POA: Diagnosis not present

## 2021-01-12 DIAGNOSIS — F332 Major depressive disorder, recurrent severe without psychotic features: Principal | ICD-10-CM | POA: Diagnosis present

## 2021-01-12 DIAGNOSIS — E1165 Type 2 diabetes mellitus with hyperglycemia: Secondary | ICD-10-CM | POA: Diagnosis present

## 2021-01-12 DIAGNOSIS — Z818 Family history of other mental and behavioral disorders: Secondary | ICD-10-CM

## 2021-01-12 DIAGNOSIS — F4000 Agoraphobia, unspecified: Secondary | ICD-10-CM | POA: Diagnosis present

## 2021-01-12 HISTORY — DX: Post-traumatic stress disorder, unspecified: F43.10

## 2021-01-12 LAB — URINALYSIS, ROUTINE W REFLEX MICROSCOPIC
Bilirubin Urine: NEGATIVE
Glucose, UA: 50 mg/dL — AB
Hgb urine dipstick: NEGATIVE
Ketones, ur: 20 mg/dL — AB
Leukocytes,Ua: NEGATIVE
Nitrite: NEGATIVE
Protein, ur: 30 mg/dL — AB
Specific Gravity, Urine: 1.023 (ref 1.005–1.030)
pH: 6 (ref 5.0–8.0)

## 2021-01-12 LAB — TSH: TSH: 8.954 u[IU]/mL — ABNORMAL HIGH (ref 0.350–4.500)

## 2021-01-12 MED ORDER — WHITE PETROLATUM EX OINT
TOPICAL_OINTMENT | CUTANEOUS | Status: AC
  Filled 2021-01-12: qty 5

## 2021-01-12 MED ORDER — ALUM & MAG HYDROXIDE-SIMETH 200-200-20 MG/5ML PO SUSP: 30.0000 mL | ORAL | Status: DC | PRN

## 2021-01-12 MED ORDER — HYDROXYZINE HCL 25 MG PO TABS
25.0000 mg | ORAL_TABLET | Freq: Three times a day (TID) | ORAL | Status: DC | PRN
  Administered 2021-01-12 – 2021-01-14 (×5): 25 mg via ORAL
  Filled 2021-01-12 (×4): qty 1
  Filled 2021-01-12: qty 10
  Filled 2021-01-12: qty 1

## 2021-01-12 MED ORDER — ACETAMINOPHEN 325 MG PO TABS
650.0000 mg | ORAL_TABLET | Freq: Four times a day (QID) | ORAL | Status: DC | PRN
  Administered 2021-01-13 – 2021-01-14 (×3): 650 mg via ORAL
  Filled 2021-01-12 (×3): qty 2

## 2021-01-12 MED ORDER — TRAZODONE HCL 50 MG PO TABS
50.0000 mg | ORAL_TABLET | Freq: Every evening | ORAL | Status: DC | PRN
  Administered 2021-01-12: 50 mg via ORAL
  Filled 2021-01-12: qty 1

## 2021-01-12 MED ORDER — ALBUTEROL SULFATE HFA 108 (90 BASE) MCG/ACT IN AERS: 1.0000 | INHALATION_SPRAY | RESPIRATORY_TRACT | Status: DC | PRN

## 2021-01-12 MED ORDER — LEVOTHYROXINE SODIUM 50 MCG PO TABS
50.0000 ug | ORAL_TABLET | Freq: Every day | ORAL | Status: DC
  Administered 2021-01-13 – 2021-01-15 (×3): 50 ug via ORAL
  Filled 2021-01-12: qty 1
  Filled 2021-01-12: qty 2
  Filled 2021-01-12: qty 1
  Filled 2021-01-12: qty 7
  Filled 2021-01-12: qty 1

## 2021-01-12 MED ORDER — MAGNESIUM HYDROXIDE 400 MG/5ML PO SUSP: 30.0000 mL | Freq: Every day | ORAL | Status: DC | PRN

## 2021-01-12 MED ORDER — TRAZODONE HCL 50 MG PO TABS
50.0000 mg | ORAL_TABLET | Freq: Every evening | ORAL | Status: DC | PRN
  Administered 2021-01-12 – 2021-01-14 (×5): 50 mg via ORAL
  Filled 2021-01-12 (×4): qty 1
  Filled 2021-01-12: qty 14
  Filled 2021-01-12: qty 1

## 2021-01-12 MED ORDER — BUPROPION HCL ER (XL) 150 MG PO TB24
150.0000 mg | ORAL_TABLET | Freq: Every day | ORAL | Status: DC
  Filled 2021-01-12 (×2): qty 1

## 2021-01-12 MED ORDER — BUPROPION HCL ER (XL) 150 MG PO TB24
150.0000 mg | ORAL_TABLET | Freq: Every day | ORAL | Status: DC
  Administered 2021-01-13 – 2021-01-14 (×2): 150 mg via ORAL
  Filled 2021-01-12 (×3): qty 1

## 2021-01-12 MED ORDER — LEVOTHYROXINE SODIUM 25 MCG PO TABS
25.0000 ug | ORAL_TABLET | Freq: Every day | ORAL | Status: DC
  Filled 2021-01-12: qty 1
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
## 2021-01-12 NOTE — Tx Team (Signed)
Initial Treatment Plan 01/12/2021 11:05 AM [name] [id number]    PATIENT STRESSORS: Financial difficulties Marital or family conflict Medication change or noncompliance Occupational concerns   PATIENT STRENGTHS: Ability for insight Active sense of humor Communication skills Physical Health Supportive family/friends   PATIENT IDENTIFIED PROBLEMS: depression  anxiety  Suicidal ideations                 DISCHARGE CRITERIA:  Ability to meet basic life and health needs Improved stabilization in mood, thinking, and/or behavior Motivation to continue treatment in a less acute level of care Need for constant or close observation no longer present  PRELIMINARY DISCHARGE PLAN: Attend PHP/IOP Outpatient therapy Return to previous living arrangement  PATIENT/FAMILY INVOLVEMENT: This treatment plan has been presented to and reviewed with the patient, Erika Rhodes.  The patient and family have been given the opportunity to ask questions and make suggestions.  Raylene Miyamoto, RN 01/12/2021, 11:05 AM
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
## 2021-01-12 NOTE — ED Notes (Signed)
Report given to Kathlene November, RN at [facility]. Patient is to go to rm [id number].
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
## 2021-01-12 NOTE — ED Notes (Signed)
Safe transport called for transportation to [facility]  

## 2021-01-12 NOTE — [facility] Group Notes (Signed)
Adult Psychoeducational Group Note  Date:  01/12/2021 Time:  8:51 PM  Group Topic/Focus:  Wrap-Up Group:   The focus of this group is to help patients review their daily goal of treatment and discuss progress on daily workbooks.  Participation Level:  Active  Participation Quality:  Appropriate  Affect:  Appropriate  Cognitive:  Appropriate  Insight: Appropriate  Engagement in Group:  Engaged  Modes of Intervention:  Discussion  Additional Comments:  Patient attended wrap-up group and participated.   Ely Ballen W Ronica Vivian 01/12/2021, 8:51 PM

## 2021-01-13 LAB — GLUCOSE, CAPILLARY
Glucose-Capillary: 264 mg/dL — ABNORMAL HIGH (ref 70–99)
Glucose-Capillary: 291 mg/dL — ABNORMAL HIGH (ref 70–99)
Glucose-Capillary: 267 mg/dL — ABNORMAL HIGH (ref 70–99)

## 2021-01-13 LAB — LIPID PANEL
Cholesterol: 211 mg/dL — ABNORMAL HIGH (ref 0–200)
HDL: 38 mg/dL — ABNORMAL LOW (ref >40)
LDL Cholesterol: 137 mg/dL — ABNORMAL HIGH (ref 0–99)
Total CHOL/HDL Ratio: 5.6 RATIO
Triglycerides: 179 mg/dL — ABNORMAL HIGH (ref <150)
VLDL: 36 mg/dL (ref 0–40)

## 2021-01-13 LAB — T4, FREE: Free T4: 0.84 ng/dL (ref 0.61–1.12)

## 2021-01-13 LAB — HEMOGLOBIN A1C
Hgb A1c MFr Bld: 10.4 % — ABNORMAL HIGH (ref 4.8–5.6)
Mean Plasma Glucose: 251.78 mg/dL

## 2021-01-13 LAB — BASIC METABOLIC PANEL
Anion gap: 11 (ref 5–15)
BUN: 15 mg/dL (ref 6–20)
CO2: 21 mmol/L — ABNORMAL LOW (ref 22–32)
Calcium: 9 mg/dL (ref 8.9–10.3)
Chloride: 102 mmol/L (ref 98–111)
Creatinine, Ser: 0.73 mg/dL (ref 0.44–1.00)
GFR, Estimated: >60 mL/min (ref >60)
Glucose, Bld: 268 mg/dL — ABNORMAL HIGH (ref 70–99)
Potassium: 3.4 mmol/L — ABNORMAL LOW (ref 3.5–5.1)
Sodium: 134 mmol/L — ABNORMAL LOW (ref 135–145)

## 2021-01-13 LAB — T4: T4, Total: 7.7 ug/dL (ref 4.5–12.0)

## 2021-01-13 NOTE — [facility] Group Notes (Signed)
[name] Group Therapy Note  Type of Therapy/Topic: Group Therapy: Six Dimensions of Wellness  Participation Level: Did not attend  Description of Group:  This group will address the concept of wellness and the six concepts of wellness: occupational, physical, social, intellectual, spiritual, and emotional. Patients will be encouraged to process areas in their lives that are out of balance and identify reasons for remaining unbalanced. Patients will be encouraged to explore ways to practice healthy habits daily to attain better physical and mental health outcomes.  Therapeutic Goals:  1. Identify aspects of wellness that they are doing well.  2. Identify aspects of wellness that they would like to improve upon.  3. Identify one action they can take to improve an aspect of wellness in their lives.  Summary of Patient Progress: Did not attend 

## 2021-01-13 NOTE — Progress Notes (Signed)
Pt says that she feels anxious today. She denies feeling depressed.  Pt denied SI/HI/AVH.  Pt has complained of headache and was given Tylenol.   Pt expressed concerns that her blood sugars have been elevated throughout the day today.  Pt said she is open to taking medications that will lower her blood sugar. Pt is sociable  and out of her room for most of the day-- interacting with peers. Pt remains safe on the unit with q 15 min checks in place.
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
## 2021-01-13 NOTE — Progress Notes (Signed)
Recreation Therapy Notes  Date: 3.7.22 Time: 0930 Location: [address] Dayroom  Group Topic: Stress Management  Goal Area(s) Addresses:  Patient will identify positive stress management techniques. Patient will identify benefits of using stress management post d/c.  Behavioral Response: Engaged  Intervention: Stress Management  Activity: Guided Imagery.  LRT read a script that took patients on a relaxing journey on the beach to listen to the peaceful waves and take in the all calming elements.  Patients were to listen as the script was read to engage in the activity.    Education:  Stress Management, Discharge Planning.   Education Outcome: Acknowledges Education  Clinical Observations/Feedback: Pt attended and participated in group session.    Arnett Duddy, LRT/CTRS         Travers Goodley A 01/13/2021 11:12 AM 

## 2021-01-13 NOTE — Tx Team (Signed)
Interdisciplinary Treatment and Diagnostic Plan Update  01/13/2021 Time of Session: 10:00am Erika Rhodes MRN: [id number]  Principal Diagnosis: MDD (major depressive disorder), recurrent episode, severe ([place])  Secondary Diagnoses: Principal Problem:   MDD (major depressive disorder), recurrent episode, severe ([place]) Active Problems:   Rheumatoid arthritis ([place])   Anxiety   Agoraphobia   Asthma   Cannabis use disorder, moderate, dependence ([place])   Hypothyroidism   Migraine with aura   PTSD (post-traumatic stress disorder)   Attention deficit hyperactivity disorder, predominantly inattentive type   Current Medications:  Current Facility-Administered Medications  Medication Dose Route Frequency Provider Last Rate Last Admin  . acetaminophen (TYLENOL) tablet 650 mg  650 mg Oral Q6H PRN Prescilla Sours, PA-C   650 mg at 01/13/21 0964  . albuterol (VENTOLIN HFA) 108 (90 Base) MCG/ACT inhaler 1-2 puff  1-2 puff Inhalation Q4H PRN Ethelene Hal, NP      . alum & mag hydroxide-simeth (MAALOX/MYLANTA) 200-200-20 MG/5ML suspension 30 mL  30 mL Oral Q4H PRN Margorie John W, PA-C      . buPROPion (WELLBUTRIN XL) 24 hr tablet 150 mg  150 mg Oral Daily Polly Cobia, RPH   150 mg at 01/13/21 0818  . hydrOXYzine (ATARAX/VISTARIL) tablet 25 mg  25 mg Oral TID PRN Prescilla Sours, PA-C   25 mg at 01/13/21 0818  . levothyroxine (SYNTHROID) tablet 50 mcg  50 mcg Oral Q0600 Ethelene Hal, NP   50 mcg at 01/13/21 3838  . magnesium hydroxide (MILK OF MAGNESIA) suspension 30 mL  30 mL Oral Daily PRN Margorie John W, PA-C      . traZODone (DESYREL) tablet 50 mg  50 mg Oral QHS PRN,MR X 1 Margorie John W, PA-C   50 mg at 01/12/21 2251   PTA Medications: Medications Prior to Admission  Medication Sig Dispense Refill Last Dose  . Black Elderberry (SAMBUCUS ELDERBERRY) 50 MG/5ML SYRP Take 150 mg by mouth daily as needed (immune support).     . gabapentin (NEURONTIN) 400 MG capsule Take 1 capsule  (400 mg total) by mouth 3 (three) times daily for 7 days. (Patient not taking: Reported on 01/12/2021) 21 capsule 0   . levothyroxine (SYNTHROID) 25 MCG tablet 1 tab by mouth daily in morning 1/2 hour before breakfast (Patient taking differently: Take 25 mcg by mouth daily before breakfast.) 30 tablet 11   . ondansetron (ZOFRAN ODT) 4 MG disintegrating tablet Take 1 tablet (4 mg total) by mouth every 8 (eight) hours as needed for nausea or vomiting. (Patient not taking: No sig reported) 20 tablet 0   . oxyCODONE-acetaminophen (PERCOCET) 5-325 MG tablet Take 1 tablet by mouth every 8 (eight) hours as needed for up to 10 doses for severe pain. (Patient not taking: No sig reported) 15 tablet 0   . oxymetazoline (NASAL SPRAY 12 HOUR) 0.05 % nasal spray Place 1 spray into both nostrils 2 (two) times daily as needed for congestion.     . potassium chloride (KLOR-CON) 10 MEQ tablet Take 4 tablets (40 mEq total) by mouth daily for 3 days. (Patient not taking: Reported on 01/12/2021) 12 tablet 0   . predniSONE (DELTASONE) 20 MG tablet Take 60 mg daily x 2 days then 40 mg daily x 2 days then 20 mg daily x 2 days (Patient not taking: No sig reported) 12 tablet 0   . sertraline (ZOLOFT) 100 MG tablet Take 1 tablet by mouth once daily (Patient taking differently: Take 100 mg by mouth  at bedtime.) 30 tablet 8   . zolpidem (AMBIEN) 10 MG tablet 1/2 to 1 tab by mouth at bedtime as needed for sleep (Patient not taking: No sig reported) 10 tablet 0     Patient Stressors: Financial difficulties Marital or family conflict Medication change or noncompliance Occupational concerns  Patient Strengths: Ability for insight Active sense of humor Communication skills Physical Health Supportive family/friends  Treatment Modalities: Medication Management, Group therapy, Case management,  1 to 1 session with clinician, Psychoeducation, Recreational therapy.   Physician Treatment Plan for Primary Diagnosis: MDD (major  depressive disorder), recurrent episode, severe ([place]) Long Term Goal(s): Improvement in symptoms so as ready for discharge Improvement in symptoms so as ready for discharge   Short Term Goals: Ability to identify changes in lifestyle to reduce recurrence of condition will improve Ability to disclose and discuss suicidal ideas Ability to identify and develop effective coping behaviors will improve Ability to identify changes in lifestyle to reduce recurrence of condition will improve Ability to disclose and discuss suicidal ideas Ability to identify and develop effective coping behaviors will improve Compliance with prescribed medications will improve Ability to identify triggers associated with substance abuse/mental health issues will improve  Medication Management: Evaluate patient's response, side effects, and tolerance of medication regimen.  Therapeutic Interventions: 1 to 1 sessions, Unit Group sessions and Medication administration.  Evaluation of Outcomes: Not Met  Physician Treatment Plan for Secondary Diagnosis: Principal Problem:   MDD (major depressive disorder), recurrent episode, severe ([facility]) Active Problems:   Rheumatoid arthritis ([place])   Anxiety   Agoraphobia   Asthma   Cannabis use disorder, moderate, dependence ([facility])   Hypothyroidism   Migraine with aura   PTSD (post-traumatic stress disorder)   Attention deficit hyperactivity disorder, predominantly inattentive type  Long Term Goal(s): Improvement in symptoms so as ready for discharge Improvement in symptoms so as ready for discharge   Short Term Goals: Ability to identify changes in lifestyle to reduce recurrence of condition will improve Ability to disclose and discuss suicidal ideas Ability to identify and develop effective coping behaviors will improve Ability to identify changes in lifestyle to reduce recurrence of condition will improve Ability to disclose and discuss suicidal ideas Ability to identify  and develop effective coping behaviors will improve Compliance with prescribed medications will improve Ability to identify triggers associated with substance abuse/mental health issues will improve     Medication Management: Evaluate patient's response, side effects, and tolerance of medication regimen.  Therapeutic Interventions: 1 to 1 sessions, Unit Group sessions and Medication administration.  Evaluation of Outcomes: Not Met   RN Treatment Plan for Primary Diagnosis: MDD (major depressive disorder), recurrent episode, severe ([place]) Long Term Goal(s): Knowledge of disease and therapeutic regimen to maintain health will improve  Short Term Goals: Ability to remain free from injury will improve, Ability to verbalize frustration and anger appropriately will improve, Ability to identify and develop effective coping behaviors will improve and Compliance with prescribed medications will improve  Medication Management: RN will administer medications as ordered by provider, will assess and evaluate patient's response and provide education to patient for prescribed medication. RN will report any adverse and/or side effects to prescribing provider.  Therapeutic Interventions: 1 on 1 counseling sessions, Psychoeducation, Medication administration, Evaluate responses to treatment, Monitor vital signs and CBGs as ordered, Perform/monitor CIWA, COWS, AIMS and Fall Risk screenings as ordered, Perform wound care treatments as ordered.  Evaluation of Outcomes: Not Met   [name] Treatment Plan for Primary Diagnosis: MDD (  major depressive disorder), recurrent episode, severe ([place]) Long Term Goal(s): Safe transition to appropriate next level of care at discharge, Engage patient in therapeutic group addressing interpersonal concerns.  Short Term Goals: Engage patient in aftercare planning with referrals and resources, Increase social support, Increase ability to appropriately verbalize feelings, Identify  triggers associated with mental health/substance abuse issues and Increase skills for wellness and recovery  Therapeutic Interventions: Assess for all discharge needs, 1 to 1 time with Social worker, Explore available resources and support systems, Assess for adequacy in community support network, Educate family and significant other(s) on suicide prevention, Complete Psychosocial Assessment, Interpersonal group therapy.  Evaluation of Outcomes: Not Met   Progress in Treatment: Attending groups: Yes. Participating in groups: Yes. Taking medication as prescribed: Yes. Toleration medication: Yes. Family/Significant other contact made: No, will contact:  if consent is provided Patient understands diagnosis: Yes. Discussing patient identified problems/goals with staff: Yes. Medical problems stabilized or resolved: Yes. Denies suicidal/homicidal ideation: Yes. Issues/concerns per patient self-inventory: No.   New problem(s) identified: No, Describe:  none  New Short Term/Long Term Goal(s):  medication stabilization, elimination of SI thoughts, development of comprehensive mental wellness plan.    Patient Goals: did not attend    Discharge Plan or Barriers: Patient recently admitted. CSW will continue to follow and assess for appropriate referrals and possible discharge planning.    Reason for Continuation of Hospitalization: Anxiety Depression Medication stabilization  Estimated Length of Stay: 3-5 days   Attendees: Patient: Did not attend 01/13/2021   Physician:  01/13/2021   Nursing:  01/13/2021   RN Care Manager: 01/13/2021   Social Worker: Darletta Moll, [place] 01/13/2021   Recreational Therapist:  01/13/2021  Other:  01/13/2021   Other:  01/13/2021   Other: 01/13/2021     Scribe for Treatment Team: Vassie Moselle, [name] 01/13/2021 11:18 AM

## 2021-01-13 NOTE — [facility] Suicide Risk Assessment (Signed)
[facility] INPATIENT:  Family/Significant Other Suicide Prevention Education  Suicide Prevention Education:  Patient Refusal for Family/Significant Other Suicide Prevention Education: The patient Erika Rhodes has refused to provide written consent for family/significant other to be provided Family/Significant Other Suicide Prevention Education during admission and/or prior to discharge.  Physician notified.  CSW completed SPE with the patient.  A suicide prevention pamphlet will be placed in the patient's chart for review at discharge. Patient reports that she does not want her family to know that she is in the hospital.   [name] 01/13/2021, 3:59 PM

## 2021-01-13 NOTE — [facility] Counselor (Signed)
CSW provided patient with a packet of resources for homelessness.  This packet contained a list of shelter, [employer] cards, suicide prevention hotline numbers, food resources, and insurance resources.  CSW will follow up with patient to provide assistance with discharge planning.  

## 2021-01-13 NOTE — Progress Notes (Signed)
[facility] MD Progress Note  01/13/2021 4:14 PM Erika Rhodes  MRN:  [id number] Subjective:  "I feel pretty good today. I have a headache but otherwise I feel good"   In Brief: Erika Rhodes is a 40 year old female who presented to [facility], voluntarily, with increased SI over the past few weeks. Her plan was to take pills to overdose but she did not act on it.  She is an only child. Her mother is alive but not supportive, she never knew her father.   Evaluation on the unit: Patient was seen, chart reviewed and case discussed with the treatment team. Patient reported sleeping well last night. She reported a fair appetite. She is taking her medication, Wellbutrin, and has complaint of a headache today. Reminded her that we increased her Levothyroxine to 50 mcg yesterday which could also cause a headache. She has taken Tylenol which has helped. Discussed medication side effects with patient, reminded her to tell staff if she needs something for her headache. Discussed CBG 291 and A1c 10.4 results with patient. Also discussed that I had spoken with Dr Fayrene Fearing about this and it is possible she may be started on metformin or SSI. She stated she has a history of gestational diabetes and had to take insulin. She also has a long history of prednisone, last time was 2 months ago, for her RA. She expressed concern about her A1c  being so high and will follow MD recommendations. She denies SI/HI/AVH, paranoia and delusions. She is calm and cooperative and participating appropriately in unit activities. She is attending group therapy.   Labs reviewed: A1c 10.4, Lipid panel with Cholesterol 211, HDL 38 and LDL 137. Serum glucose 268, T4 0.84. Discussed elevated A1c and CBG with Dr Fayrene Fearing, awaiting her input on diabetes medication.   Principal Problem: MDD (major depressive disorder), recurrent episode, severe (HCC) Diagnosis: Principal Problem:   MDD (major depressive disorder), recurrent episode, severe (HCC) Active Problems:    Rheumatoid arthritis (HCC)   Anxiety   Agoraphobia   Asthma   Cannabis use disorder, moderate, dependence (HCC)   Hypothyroidism   Migraine with aura   PTSD (post-traumatic stress disorder)   Attention deficit hyperactivity disorder, predominantly inattentive type  Total Time spent with patient: 25 minutes  Past Psychiatric History: Anxiety, Depression, PTSD  Past Medical History:  Past Medical History:  Diagnosis Date  . Allergic reaction   . Anxiety   . Asthma   . Depression   . DM (diabetes mellitus), gestational   . Migraines   . Rheumatoid arthritis (HCC) 2016   Diagnosed Dr.  Patsi Sears; has been treated with MTX, Humira, etc, but made her sick.  Was taking CBD oil and stopped as tested positive in a drug screen for work.    Past Surgical History:  Procedure Laterality Date  . ABDOMINAL HYSTERECTOMY  2017   Fibroids; Both ovaries intact still  . BACK SURGERY    . CESAREAN SECTION  2009  . CHOLECYSTECTOMY  2010   laparoscopic  . TONSILLECTOMY     Family History:  Family History  Problem Relation Age of Onset  . Arthritis Mother        Rheumatoid  . Depression Daughter        and anxiety  . Allergies Son    Family Psychiatric  History: None known per patient Social History:  Social History   Substance and Sexual Activity  Alcohol Use Not Currently     Social History   Substance and  Sexual Activity  Drug Use Never    Social History   Socioeconomic History  . Marital status: Media planner    Spouse name: Not on file  . Number of children: 5  . Years of education: Not on file  . Highest education level: Associate degree: academic program  Occupational History  . Not on file  Tobacco Use  . Smoking status: Never Smoker  . Smokeless tobacco: Never Used  Vaping Use  . Vaping Use: Former  . Substances: CBD  Substance and Sexual Activity  . Alcohol use: Not Currently  . Drug use: Never  . Sexual activity: Not on file  Other Topics  Concern  . Not on file  Social History Narrative   Lives with current boyfriend   See history of present illness for more history 04/22/2020   Social Determinants of Health   Financial Resource Strain: Not on file  Food Insecurity: Not on file  Transportation Needs: Not on file  Physical Activity: Not on file  Stress: Not on file  Social Connections: Not on file   Additional Social History:   Sleep: Good  Appetite:  Fair  Current Medications: Current Facility-Administered Medications  Medication Dose Route Frequency Provider Last Rate Last Admin  . acetaminophen (TYLENOL) tablet 650 mg  650 mg Oral Q6H PRN Jaclyn Shaggy, PA-C   650 mg at 01/13/21 1610  . albuterol (VENTOLIN HFA) 108 (90 Base) MCG/ACT inhaler 1-2 puff  1-2 puff Inhalation Q4H PRN Laveda Abbe, NP      . alum & mag hydroxide-simeth (MAALOX/MYLANTA) 200-200-20 MG/5ML suspension 30 mL  30 mL Oral Q4H PRN Melbourne Abts W, PA-C      . buPROPion (WELLBUTRIN XL) 24 hr tablet 150 mg  150 mg Oral Daily Danford Bad, RPH   150 mg at 01/13/21 0818  . hydrOXYzine (ATARAX/VISTARIL) tablet 25 mg  25 mg Oral TID PRN Jaclyn Shaggy, PA-C   25 mg at 01/13/21 0818  . levothyroxine (SYNTHROID) tablet 50 mcg  50 mcg Oral Q0600 Laveda Abbe, NP   50 mcg at 01/13/21 9604  . magnesium hydroxide (MILK OF MAGNESIA) suspension 30 mL  30 mL Oral Daily PRN Melbourne Abts W, PA-C      . traZODone (DESYREL) tablet 50 mg  50 mg Oral QHS PRN,MR X 1 Melbourne Abts W, PA-C   50 mg at 01/12/21 2251    Lab Results:  Results for orders placed or performed during the hospital encounter of 01/12/21 (from the past 48 hour(s))  Urinalysis, Routine w reflex microscopic     Status: Abnormal   Collection Time: 01/11/21  9:01 PM  Result Value Ref Range   Color, Urine YELLOW YELLOW   APPearance HAZY (A) CLEAR   Specific Gravity, Urine 1.023 1.005 - 1.030   pH 6.0 5.0 - 8.0   Glucose, UA 50 (A) NEGATIVE mg/dL   Hgb urine dipstick  NEGATIVE NEGATIVE   Bilirubin Urine NEGATIVE NEGATIVE   Ketones, ur 20 (A) NEGATIVE mg/dL   Protein, ur 30 (A) NEGATIVE mg/dL   Nitrite NEGATIVE NEGATIVE   Leukocytes,Ua NEGATIVE NEGATIVE   RBC / HPF 6-10 0 - 5 RBC/hpf   WBC, UA 11-20 0 - 5 WBC/hpf   Bacteria, UA RARE (A) NONE SEEN   Squamous Epithelial / LPF 6-10 0 - 5   Mucus PRESENT    Ca Oxalate Crys, UA PRESENT     Comment: Performed at [facility], [address]  [place], [place] [address]  Glucose, capillary     Status: Abnormal   Collection Time: 01/13/21  6:12 AM  Result Value Ref Range   Glucose-Capillary 264 (H) 70 - 99 mg/dL    Comment: Glucose reference range applies only to samples taken after fasting for at least 8 hours.   Comment 1 Notify RN   Lipid panel     Status: Abnormal   Collection Time: 01/13/21  6:26 AM  Result Value Ref Range   Cholesterol 211 (H) 0 - 200 mg/dL   Triglycerides 604 (H) <150 mg/dL   HDL 38 (L) >54 mg/dL   Total CHOL/HDL Ratio 5.6 RATIO   VLDL 36 0 - 40 mg/dL   LDL Cholesterol 098 (H) 0 - 99 mg/dL    Comment:        Total Cholesterol/HDL:CHD Risk Coronary Heart Disease Risk Table                     Men   Women  1/2 Average Risk   3.4   3.3  Average Risk       5.0   4.4  2 X Average Risk   9.6   7.1  3 X Average Risk  23.4   11.0        Use the calculated Patient Ratio above and the CHD Risk Table to determine the patient's CHD Risk.        ATP III CLASSIFICATION (LDL):  <100     mg/dL   Optimal  119-147  mg/dL   Near or Above                    Optimal  130-159  mg/dL   Borderline  829-562  mg/dL   High  >130     mg/dL   Very High Performed at [facility], [address], [place], [place] [address]   Hemoglobin A1c     Status: Abnormal   Collection Time: 01/13/21  6:26 AM  Result Value Ref Range   Hgb A1c MFr Bld 10.4 (H) 4.8 - 5.6 %    Comment: (NOTE) Pre diabetes:          5.7%-6.4%  Diabetes:              >6.4%  Glycemic  control for   <7.0% adults with diabetes    Mean Plasma Glucose 251.78 mg/dL    Comment: Performed at [facility], [address], [place], [place] [address]  T4, free     Status: None   Collection Time: 01/13/21  6:26 AM  Result Value Ref Range   Free T4 0.84 0.61 - 1.12 ng/dL    Comment: (NOTE) Biotin ingestion may interfere with free T4 tests. If the results are inconsistent with the TSH level, previous test results, or the clinical presentation, then consider biotin interference. If needed, order repeat testing after stopping biotin. Performed at [facility], [address], [place], [place] [address]   Basic metabolic panel     Status: Abnormal   Collection Time: 01/13/21  6:26 AM  Result Value Ref Range   Sodium 134 (L) 135 - 145 mmol/L   Potassium 3.4 (L) 3.5 - 5.1 mmol/L   Chloride 102 98 - 111 mmol/L   CO2 21 (L) 22 - 32 mmol/L   Glucose, Bld 268 (H) 70 - 99 mg/dL    Comment: Glucose reference range applies only to samples taken after  fasting for at least 8 hours.   BUN 15 6 - 20 mg/dL   Creatinine, Ser 0.98 0.44 - 1.00 mg/dL   Calcium 9.0 8.9 - 11.9 mg/dL   GFR, Estimated >14 >78 mL/min    Comment: (NOTE) Calculated using the CKD-EPI Creatinine Equation (2021)    Anion gap 11 5 - 15    Comment: Performed at [facility], [address], [place], [place] [address]  Glucose, capillary     Status: Abnormal   Collection Time: 01/13/21 12:06 PM  Result Value Ref Range   Glucose-Capillary 291 (H) 70 - 99 mg/dL    Comment: Glucose reference range applies only to samples taken after fasting for at least 8 hours.    Blood Alcohol level:  Lab Results  Component Value Date   ETH <10 01/11/2021   ETH <10 10/06/2018    Metabolic Disorder Labs: Lab Results  Component Value Date   HGBA1C 10.4 (H) 01/13/2021   MPG 251.78 01/13/2021   No results found for: PROLACTIN Lab Results  Component Value Date   CHOL 211 (H) 01/13/2021   TRIG  179 (H) 01/13/2021   HDL 38 (L) 01/13/2021   CHOLHDL 5.6 01/13/2021   VLDL 36 01/13/2021   LDLCALC 137 (H) 01/13/2021   LDLCALC 150 (H) 06/13/2020    Physical Findings: AIMS: Facial and Oral Movements Muscles of Facial Expression: None, normal Lips and Perioral Area: None, normal Jaw: None, normal Tongue: None, normal,Extremity Movements Upper (arms, wrists, hands, fingers): None, normal Lower (legs, knees, ankles, toes): None, normal, Trunk Movements Neck, shoulders, hips: None, normal, Overall Severity Severity of abnormal movements (highest score from questions above): None, normal Incapacitation due to abnormal movements: None, normal Patient's awareness of abnormal movements (rate only patient's report): No Awareness, Dental Status Current problems with teeth and/or dentures?: No Does patient usually wear dentures?: No  CIWA:    COWS:     Musculoskeletal: Strength & Muscle Tone: within normal limits Gait & Station: normal Patient leans: N/A  Psychiatric Specialty Exam: Physical Exam Vitals and nursing note reviewed.  Constitutional:      Appearance: Normal appearance.  HENT:     Head: Normocephalic.  Pulmonary:     Effort: Pulmonary effort is normal.  Musculoskeletal:        General: Normal range of motion.     Cervical back: Normal range of motion.  Neurological:     Mental Status: She is alert and oriented to person, place, and time.  Psychiatric:        Attention and Perception: Attention normal. She does not perceive auditory or visual hallucinations.        Mood and Affect: Mood is anxious and depressed.        Speech: Speech normal.        Behavior: Behavior normal. Behavior is cooperative.        Thought Content: Thought content normal.        Cognition and Memory: Cognition normal.     Review of Systems  Constitutional: Negative.   HENT: Negative for rhinorrhea, sneezing and sore throat.   Respiratory: Negative for cough, shortness of breath and  wheezing.   Gastrointestinal: Negative.   Genitourinary: Negative.   Neurological: Positive for headaches.    Blood pressure 126/86, pulse (!) 120, temperature 97.6 F (36.4 C), temperature source Oral, resp. rate 18, height 5\' 4"  (1.626 m), weight 93 kg, SpO2 99 %.Body mass index is 35.19 kg/m.  General Appearance: Casual and Fairly  Groomed  Eye Contact:  Good  Speech:  Clear and Coherent and Normal Rate  Volume:  Normal  Mood:  Anxious and Depressed  Affect:  Congruent and Depressed  Thought Process:  Coherent and Descriptions of Associations: Intact  Orientation:  Full (Time, Place, and Person)  Thought Content:  Logical  Suicidal Thoughts:  denies  Homicidal Thoughts:  denies  Memory:  Immediate;   Fair Recent;   Fair Remote;   Fair  Judgement:  Fair  Insight:  Fair  Psychomotor Activity:  Normal  Concentration:  Concentration: Fair and Attention Span: Fair  Recall:  [employer] of Knowledge:  Fair  Language:  Good  Akathisia:  No  Handed:  Right  AIMS (if indicated):     Assets:  Architect Housing Resilience Social Support  ADL's:  Intact  Cognition:  WNL  Sleep:  Number of Hours: 6     Treatment Plan Summary: Daily contact with patient to assess and evaluate symptoms and progress in treatment and Medication management   Depression:  -Continue Wellbutrin XL 150 mg PO daily  -Continue Vistaril 25 mg PO  PRN q 6hrs  Insomnia:  -Continue Trazodone 50 mg PO PRN at bedtime, may repeat x 1  Hypothyroidism:  -Continue Levothyroxine from 25 mcg to 50 mcg daily for TSH 8.954.   Asthma: -Continue Proventil Inhaler 2 puffs q 4hrs PRN  PRN medications:  Tylenol 650 mg PO q 6hrs PRN for mild pain Mylanta 30 mL  q 4hrs PRN for indigestion MOM 30 mL daily PRN for constipation   15 minute safety checks Encourage participation in the therapeutic milieu Discharge planning in progress   Laveda Abbe,  NP 01/13/2021, 4:14 PM

## 2021-01-13 NOTE — [facility] Group Notes (Signed)
Occupational Therapy Group Note Date: 01/13/2021 Group Topic/Focus: Stress Management  Group Description: Group encouraged increased participation and engagement through discussion focused on topic of stress management. Patients engaged interactively to discuss components of stress including physical signs, emotional signs, negative management strategies, and positive management strategies. Each individual identified one new stress management strategy they would like to try moving forward.    Therapeutic Goals: Identify current stressors Identify healthy vs unhealthy stress management strategies/techniques Discuss and identify physical and emotional signs of stress Participation Level: Active   Participation Quality: Independent   Behavior: Calm, Cooperative and Interactive   Speech/Thought Process: Focused   Affect/Mood: Full range   Insight: Fair   Judgement: Fair   Individualization: Erika Rhodes was active in their participation of discussion, sharing openly and offering support to peers. Receptive to support and education surrounding strategies manage current stressors. Pt actively engaged for duration.   Modes of Intervention: Discussion, Education, Socialization and Support  Patient Response to Interventions:  Attentive, Engaged, Receptive and Interested   Plan: Continue to engage patient in OT groups 2 - 3x/week.  01/13/2021  Donne Hazel, MOT, OTR/L

## 2021-01-13 NOTE — Progress Notes (Signed)
Patient rated her day as a 9 out of 10 since she started a new medication today and it has made her feel jittery and nervous. Her goal for tomorrow is to attend more groups.

## 2021-01-14 DIAGNOSIS — F332 Major depressive disorder, recurrent severe without psychotic features: Secondary | ICD-10-CM | POA: Diagnosis not present

## 2021-01-14 LAB — GLUCOSE, CAPILLARY
Glucose-Capillary: 303 mg/dL — ABNORMAL HIGH (ref 70–99)
Glucose-Capillary: 302 mg/dL — ABNORMAL HIGH (ref 70–99)
Glucose-Capillary: 212 mg/dL — ABNORMAL HIGH (ref 70–99)
Glucose-Capillary: 261 mg/dL — ABNORMAL HIGH (ref 70–99)

## 2021-01-14 MED ORDER — INSULIN ASPART 100 UNIT/ML ~~LOC~~ SOLN
0.0000 [IU] | Freq: Three times a day (TID) | SUBCUTANEOUS | Status: DC
  Administered 2021-01-14: 11 [IU] via SUBCUTANEOUS
  Administered 2021-01-14 – 2021-01-15 (×2): 5 [IU] via SUBCUTANEOUS

## 2021-01-14 MED ORDER — SUMATRIPTAN SUCCINATE 50 MG PO TABS: 50.0000 mg | ORAL_TABLET | ORAL | Status: DC | PRN

## 2021-01-14 MED ORDER — METFORMIN HCL 500 MG PO TABS
500.0000 mg | ORAL_TABLET | Freq: Every day | ORAL | Status: DC
  Administered 2021-01-14: 500 mg via ORAL
  Filled 2021-01-14 (×4): qty 1

## 2021-01-14 MED ORDER — CLONIDINE HCL 0.1 MG PO TABS
0.1000 mg | ORAL_TABLET | Freq: Every day | ORAL | Status: DC
  Administered 2021-01-14: 0.1 mg via ORAL
  Filled 2021-01-14 (×2): qty 1

## 2021-01-14 MED ORDER — DULOXETINE HCL 30 MG PO CPEP
30.0000 mg | ORAL_CAPSULE | Freq: Every day | ORAL | Status: DC
  Administered 2021-01-14 – 2021-01-15 (×2): 30 mg via ORAL
  Filled 2021-01-14 (×4): qty 1

## 2021-01-14 MED ORDER — PANTOPRAZOLE SODIUM 40 MG PO TBEC
40.0000 mg | DELAYED_RELEASE_TABLET | Freq: Every day | ORAL | Status: DC
  Administered 2021-01-15: 40 mg via ORAL
  Filled 2021-01-14 (×3): qty 1

## 2021-01-14 MED ORDER — NAPROXEN 375 MG PO TABS
375.0000 mg | ORAL_TABLET | Freq: Two times a day (BID) | ORAL | Status: DC
  Administered 2021-01-14 – 2021-01-15 (×2): 375 mg via ORAL
  Filled 2021-01-14 (×4): qty 1

## 2021-01-14 NOTE — Progress Notes (Signed)
[facility] MD Progress Note  01/14/2021 1:13 PM Erika Rhodes  MRN:  [id number] Subjective: Patient is a 40 year old female who originally presented to the [facility] emergency department on 01/11/2021 with increasing suicidal ideation over the last several weeks.  She stated she had multiple plans on how she would harm her self.  She was admitted on 01/12/2021.  Objective: Patient is seen and examined.  Patient is a 40 year old female with the above-stated past psychiatric history who is seen in follow-up.  She states she does not feel well today.  She stated that after having started the Wellbutrin she has felt more anxious, more irritable, and more psychomotor agitated.  She stated she had been previously treated with multiple SSRI antidepressant medications, and either they led to side effects, or were not effective.  We discussed the fact that Wellbutrin can often exacerbate anxiety, sleep and physical agitation.  We discussed stopping this.  Also her blood sugar is significantly elevated.  Her blood sugar at 1140 today was 302.  It was approximately 303 at 6:12 AM.  Reportedly she has been on several courses of steroids secondary to a rheumatological illness.  She stated it is been quite a while since she had any steroids.  We discussed the possibility of trying duloxetine for depression and anxiety, and is well starting Metformin and his sliding scale insulin given the elevation of her blood sugar.  Her hemoglobin A1c from this hospitalization is 10.4.  Her TSH is also abnormal.  It was 8.954.  T4 was 0.84 and T4 thyroxine was 7.7.  She is on levothyroxine 50 mcg p.o. daily.  He does not appear that she has been treated for hypothyroidism in the past.  She stated she was frustrated with being in the hospital and did not feel as though that the groups were enough.  We also discussed the fact that the Cymbalta would potentially help her chronic pain from her rheumatological illness.  Her vital  signs are stable, she is afebrile.  Pulse oximetry on room air was 97%.  She only slept 4.75 hours last night.  Principal Problem: MDD (major depressive disorder), recurrent episode, severe (HCC) Diagnosis: Principal Problem:   MDD (major depressive disorder), recurrent episode, severe (HCC) Active Problems:   Rheumatoid arthritis (HCC)   Anxiety   Agoraphobia   Asthma   Cannabis use disorder, moderate, dependence (HCC)   Hypothyroidism   Migraine with aura   PTSD (post-traumatic stress disorder)   Attention deficit hyperactivity disorder, predominantly inattentive type  Total Time spent with patient: 20 minutes  Past Psychiatric History: See admission H&P  Past Medical History:  Past Medical History:  Diagnosis Date  . Allergic reaction   . Anxiety   . Asthma   . Depression   . DM (diabetes mellitus), gestational   . Migraines   . Rheumatoid arthritis (HCC) 2016   Diagnosed Dr.  Patsi Sears; has been treated with MTX, Humira, etc, but made her sick.  Was taking CBD oil and stopped as tested positive in a drug screen for work.    Past Surgical History:  Procedure Laterality Date  . ABDOMINAL HYSTERECTOMY  2017   Fibroids; Both ovaries intact still  . BACK SURGERY    . CESAREAN SECTION  2009  . CHOLECYSTECTOMY  2010   laparoscopic  . TONSILLECTOMY     Family History:  Family History  Problem Relation Age of Onset  . Arthritis Mother        Rheumatoid  .  Depression Daughter        and anxiety  . Allergies Son    Family Psychiatric  History: See admission H&P Social History:  Social History   Substance and Sexual Activity  Alcohol Use Not Currently     Social History   Substance and Sexual Activity  Drug Use Never    Social History   Socioeconomic History  . Marital status: Media planner    Spouse name: Not on file  . Number of children: 5  . Years of education: Not on file  . Highest education level: Associate degree: academic program   Occupational History  . Not on file  Tobacco Use  . Smoking status: Never Smoker  . Smokeless tobacco: Never Used  Vaping Use  . Vaping Use: Former  . Substances: CBD  Substance and Sexual Activity  . Alcohol use: Not Currently  . Drug use: Never  . Sexual activity: Not on file  Other Topics Concern  . Not on file  Social History Narrative   Lives with current boyfriend   See history of present illness for more history 04/22/2020   Social Determinants of Health   Financial Resource Strain: Not on file  Food Insecurity: Not on file  Transportation Needs: Not on file  Physical Activity: Not on file  Stress: Not on file  Social Connections: Not on file   Additional Social History:                         Sleep: Poor  Appetite:  Fair  Current Medications: Current Facility-Administered Medications  Medication Dose Route Frequency Provider Last Rate Last Admin  . acetaminophen (TYLENOL) tablet 650 mg  650 mg Oral Q6H PRN Jaclyn Shaggy, PA-C   650 mg at 01/14/21 0751  . albuterol (VENTOLIN HFA) 108 (90 Base) MCG/ACT inhaler 1-2 puff  1-2 puff Inhalation Q4H PRN Laveda Abbe, NP      . alum & mag hydroxide-simeth (MAALOX/MYLANTA) 200-200-20 MG/5ML suspension 30 mL  30 mL Oral Q4H PRN Melbourne Abts W, PA-C      . cloNIDine (CATAPRES) tablet 0.1 mg  0.1 mg Oral QHS Antonieta Pert, MD      . DULoxetine (CYMBALTA) DR capsule 30 mg  30 mg Oral Daily Antonieta Pert, MD   30 mg at 01/14/21 1143  . hydrOXYzine (ATARAX/VISTARIL) tablet 25 mg  25 mg Oral TID PRN Jaclyn Shaggy, PA-C   25 mg at 01/14/21 0751  . insulin aspart (novoLOG) injection 0-15 Units  0-15 Units Subcutaneous TID WC Antonieta Pert, MD   11 Units at 01/14/21 1144  . levothyroxine (SYNTHROID) tablet 50 mcg  50 mcg Oral Q0600 Laveda Abbe, NP   50 mcg at 01/14/21 0615  . magnesium hydroxide (MILK OF MAGNESIA) suspension 30 mL  30 mL Oral Daily PRN Melbourne Abts W, PA-C      .  metFORMIN (GLUCOPHAGE) tablet 500 mg  500 mg Oral Q breakfast Antonieta Pert, MD   500 mg at 01/14/21 1143  . traZODone (DESYREL) tablet 50 mg  50 mg Oral QHS PRN,MR X 1 Melbourne Abts W, PA-C   50 mg at 01/13/21 2350    Lab Results:  Results for orders placed or performed during the hospital encounter of 01/12/21 (from the past 48 hour(s))  Glucose, capillary     Status: Abnormal   Collection Time: 01/13/21  6:12 AM  Result Value Ref Range  Glucose-Capillary 264 (H) 70 - 99 mg/dL    Comment: Glucose reference range applies only to samples taken after fasting for at least 8 hours.   Comment 1 Notify RN   Lipid panel     Status: Abnormal   Collection Time: 01/13/21  6:26 AM  Result Value Ref Range   Cholesterol 211 (H) 0 - 200 mg/dL   Triglycerides 161 (H) <150 mg/dL   HDL 38 (L) >09 mg/dL   Total CHOL/HDL Ratio 5.6 RATIO   VLDL 36 0 - 40 mg/dL   LDL Cholesterol 604 (H) 0 - 99 mg/dL    Comment:        Total Cholesterol/HDL:CHD Risk Coronary Heart Disease Risk Table                     Men   Women  1/2 Average Risk   3.4   3.3  Average Risk       5.0   4.4  2 X Average Risk   9.6   7.1  3 X Average Risk  23.4   11.0        Use the calculated Patient Ratio above and the CHD Risk Table to determine the patient's CHD Risk.        ATP III CLASSIFICATION (LDL):  <100     mg/dL   Optimal  540-981  mg/dL   Near or Above                    Optimal  130-159  mg/dL   Borderline  191-478  mg/dL   High  >295     mg/dL   Very High Performed at [facility], [address] [place], [place] [address]   Hemoglobin A1c     Status: Abnormal   Collection Time: 01/13/21  6:26 AM  Result Value Ref Range   Hgb A1c MFr Bld 10.4 (H) 4.8 - 5.6 %    Comment: (NOTE) Pre diabetes:          5.7%-6.4%  Diabetes:              >6.4%  Glycemic control for   <7.0% adults with diabetes    Mean Plasma Glucose 251.78 mg/dL    Comment: Performed at [facility], [address] [place], [place] [address]  T4, free     Status: None   Collection Time: 01/13/21  6:26 AM  Result Value Ref Range   Free T4 0.84 0.61 - 1.12 ng/dL    Comment: (NOTE) Biotin ingestion may interfere with free T4 tests. If the results are inconsistent with the TSH level, previous test results, or the clinical presentation, then consider biotin interference. If needed, order repeat testing after stopping biotin. Performed at [facility], [address], [place], [place] [address]   Basic metabolic panel     Status: Abnormal   Collection Time: 01/13/21  6:26 AM  Result Value Ref Range   Sodium 134 (L) 135 - 145 mmol/L   Potassium 3.4 (L) 3.5 - 5.1 mmol/L   Chloride 102 98 - 111 mmol/L   CO2 21 (L) 22 - 32 mmol/L   Glucose, Bld 268 (H) 70 - 99 mg/dL    Comment: Glucose reference range applies only to samples taken after fasting for at least 8 hours.   BUN 15 6 - 20 mg/dL   Creatinine, Ser 9.52 0.44 - 1.00 mg/dL   Calcium 9.0  8.9 - 10.3 mg/dL   GFR, Estimated >16 >10 mL/min    Comment: (NOTE) Calculated using the CKD-EPI Creatinine Equation (2021)    Anion gap 11 5 - 15    Comment: Performed at [facility], [address], [place], [place] [address]  Glucose, capillary     Status: Abnormal   Collection Time: 01/13/21 12:06 PM  Result Value Ref Range   Glucose-Capillary 291 (H) 70 - 99 mg/dL    Comment: Glucose reference range applies only to samples taken after fasting for at least 8 hours.  Glucose, capillary     Status: Abnormal   Collection Time: 01/13/21  5:18 PM  Result Value Ref Range   Glucose-Capillary 267 (H) 70 - 99 mg/dL    Comment: Glucose reference range applies only to samples taken after fasting for at least 8 hours.  Glucose, capillary     Status: Abnormal   Collection Time: 01/14/21  6:12 AM  Result Value Ref Range   Glucose-Capillary 303 (H) 70 - 99 mg/dL    Comment: Glucose reference range applies only to samples taken after  fasting for at least 8 hours.  Glucose, capillary     Status: Abnormal   Collection Time: 01/14/21 11:40 AM  Result Value Ref Range   Glucose-Capillary 302 (H) 70 - 99 mg/dL    Comment: Glucose reference range applies only to samples taken after fasting for at least 8 hours.    Blood Alcohol level:  Lab Results  Component Value Date   ETH <10 01/11/2021   ETH <10 10/06/2018    Metabolic Disorder Labs: Lab Results  Component Value Date   HGBA1C 10.4 (H) 01/13/2021   MPG 251.78 01/13/2021   No results found for: PROLACTIN Lab Results  Component Value Date   CHOL 211 (H) 01/13/2021   TRIG 179 (H) 01/13/2021   HDL 38 (L) 01/13/2021   CHOLHDL 5.6 01/13/2021   VLDL 36 01/13/2021   LDLCALC 137 (H) 01/13/2021   LDLCALC 150 (H) 06/13/2020    Physical Findings: AIMS: Facial and Oral Movements Muscles of Facial Expression: None, normal Lips and Perioral Area: None, normal Jaw: None, normal Tongue: None, normal,Extremity Movements Upper (arms, wrists, hands, fingers): None, normal Lower (legs, knees, ankles, toes): None, normal, Trunk Movements Neck, shoulders, hips: None, normal, Overall Severity Severity of abnormal movements (highest score from questions above): None, normal Incapacitation due to abnormal movements: None, normal Patient's awareness of abnormal movements (rate only patient's report): No Awareness, Dental Status Current problems with teeth and/or dentures?: No Does patient usually wear dentures?: No  CIWA:    COWS:     Musculoskeletal: Strength & Muscle Tone: within normal limits Gait & Station: normal Patient leans: N/A  Psychiatric Specialty Exam: Physical Exam Vitals and nursing note reviewed.  Constitutional:      Appearance: Normal appearance.  HENT:     Head: Normocephalic and atraumatic.  Pulmonary:     Effort: Pulmonary effort is normal.  Neurological:     General: No focal deficit present.     Mental Status: She is alert and oriented to  person, place, and time.     Review of Systems  Blood pressure (!) 117/98, pulse (!) 106, temperature (!) 97.5 F (36.4 C), temperature source Oral, resp. rate 18, height  (1.626 m), weight 93 kg, SpO2 97 %.Body mass index is 35.19 kg/m.  General Appearance: Fairly Groomed  Eye Contact:  Good  Speech:  Normal Rate  Volume:  Normal  Mood:  Anxious and Depressed  Affect:  Congruent  Thought Process:  Coherent and Descriptions of Associations: Intact  Orientation:  Full (Time, Place, and Person)  Thought Content:  Logical  Suicidal Thoughts:  Yes.  without intent/plan  Homicidal Thoughts:  No  Memory:  Immediate;   Fair Recent;   Fair Remote;   Fair  Judgement:  Intact  Insight:  Fair  Psychomotor Activity:  Increased  Concentration:  Concentration: Fair and Attention Span: Fair  Recall:  [employer] of Knowledge:  Good  Language:  Good  Akathisia:  Negative  Handed:  Right  AIMS (if indicated):     Assets:  Desire for Improvement Resilience  ADL's:  Intact  Cognition:  WNL  Sleep:  Number of Hours: 4.75     Treatment Plan Summary: Daily contact with patient to assess and evaluate symptoms and progress in treatment, Medication management and Plan : Patient is seen and examined.  Patient is a 40 year old female with the above-stated past psychiatric history who is seen in follow-up.   Diagnosis: 1.  Major depression, recurrent, severe without psychotic symptoms 2.  Insomnia. 3.  Posttraumatic stress disorder. 4.  Poorly controlled diabetes mellitus type 2 5.  Hypothyroidism 6.  Rheumatoid arthritis  Pertinent findings on examination today: 1.  Depression remains significant. 2.  A mild decrease in suicidal ideation but still present. 3.  Continued headaches with history of migraine headaches 4.  Poorly controlled diabetes mellitus type 2 5.  Poorly controlled hypothyroidism 6.  Poor sleep  Plan: 1.  Stop Wellbutrin XL. 2.  Start Cymbalta 30 mg p.o. daily  and titrate for anxiety and depression. 3.  Continue hydroxyzine 25 mg p.o. 3 times daily as needed anxiety. 4.  Start moderate sliding scale insulin. 5.  Start Metformin 500 mg p.o. daily for diabetes mellitus type 2 and titrate. 6.  Continue levothyroxine 50 mcg p.o. daily for hypothyroidism.  Review of the electronic medical record revealed she had been on his high doses 150 mcg p.o. daily, so this will have to be monitored. 7.  Continue blood sugar checks 3 times daily with food 8.  Continue trazodone 50 mg p.o. nightly as needed insomnia. 9.  Add Imitrex as needed for migraine headaches 10.  Add Naprosyn 375 mg p.o. every 8 hours as needed pain.  This would include headaches as well. 11.  Disposition planning-in progress.  Antonieta Pert, MD 01/14/2021, 1:13 PM

## 2021-01-14 NOTE — Progress Notes (Cosign Needed)
Adult Psychoeducational Group Note  Date:  01/14/2021 Time:  10:23 AM  Group Topic/Focus:  Goals Group:   The focus of this group is to help patients establish daily goals to achieve during treatment and discuss how the patient can incorporate goal setting into their daily lives to aide in recovery.  Participation Level:  Active  Participation Quality:  Appropriate  Affect:  Appropriate  Cognitive:  Appropriate  Insight: Appropriate  Engagement in Group:  Engaged  Modes of Intervention:  Discussion  Additional Comments:  Pt attended group and participated in discussion.  Akasia Ahmad R Jenasis Straley 01/14/2021, 10:23 AM

## 2021-01-14 NOTE — Progress Notes (Signed)
D: Patient presents with pleasant affect. Patient denies SI/HI at this time. Patient also denies AH/VH at this time. Patient contracts for safety.  A: Provided positive reinforcement and encouragement.  R: Patient cooperative and receptive to efforts. Patient remains safe on the unit.   01/13/21 2149  Psych Admission Type (Psych Patients Only)  Admission Status Voluntary  Psychosocial Assessment  Patient Complaints Depression  Eye Contact Fair  Facial Expression Animated;Anxious  Affect Appropriate to circumstance  Speech Logical/coherent  Interaction Assertive  Motor Activity Fidgety  Appearance/Hygiene Unremarkable  Behavior Characteristics Cooperative;Unable to participate  Mood Anxious  Thought Process  Coherency WDL  Content WDL  Delusions None reported or observed  Perception WDL  Hallucination None reported or observed  Judgment WDL  Confusion None  Danger to Self  Current suicidal ideation? Denies  Danger to Others  Danger to Others None reported or observed

## 2021-01-14 NOTE — Progress Notes (Signed)
Inpatient Diabetes Program Recommendations  AACE/ADA: New Consensus Statement on Inpatient Glycemic Control (2015)  Target Ranges:  Prepandial:   less than 140 mg/dL      Peak postprandial:   less than 180 mg/dL (1-2 hours)      Critically ill patients:  140 - 180 mg/dL   Lab Results  Component Value Date   GLUCAP 303 (H) 01/14/2021   HGBA1C 10.4 (H) 01/13/2021   Results for [name], [name] (MRN [id number]) as of 01/14/2021 10:35  Ref. Range 06/13/2020 10:22  Hemoglobin A1C Latest Ref Range: 4.8 - 5.6 % 6.2 (H)   Review of Glycemic Control  Inpatient Diabetes Program Recommendations:    Current A1c 10.4 and prior A1c 6.2 on 01/15/20. New diagnosis diabetes type 2. Consider starting metformin 500 mg bid and Novolog 0-15 units correction tid to evaluate total dose insulin needed.  Thank you, Erika Rhodes. Josua Ferrebee, RN, MSN, CDE  Diabetes Coordinator Inpatient Glycemic Control Team Team Pager [phone] (8am-5pm) 01/14/2021 10:42 AM

## 2021-01-14 NOTE — Progress Notes (Signed)
Pt denied SI/HI/AVH. Pt disclosed that it was her son's birthday party today and pt did not get invited (by pt's w xhusband who has cutody of son).  Pt's blood sugars have been elevated and pt received education on Metformin and insulin IM shots.  Pt has had no adverse reactions to medications.  Pt is coping well at this time.  Pt remains safe with q 15 min checks in place.

## 2021-01-14 NOTE — Progress Notes (Signed)
Pt visible in the dayroom this evening, stated she was feeling a little better being around people and not being isolated.    01/14/21 2100  Psych Admission Type (Psych Patients Only)  Admission Status Involuntary  Psychosocial Assessment  Patient Complaints Anxiety  Eye Contact Fair  Facial Expression Animated;Anxious  Affect Appropriate to circumstance  Speech Logical/coherent  Interaction Assertive  Motor Activity Fidgety  Appearance/Hygiene Unremarkable  Behavior Characteristics Cooperative;Anxious  Mood Anxious  Thought Process  Coherency WDL  Content WDL  Delusions None reported or observed  Perception WDL  Hallucination None reported or observed  Judgment WDL  Confusion None  Danger to Self  Current suicidal ideation? Denies  Danger to Others  Danger to Others None reported or observed

## 2021-01-14 NOTE — Progress Notes (Signed)
Psychoeducational Group Note  Date:  01/14/2021 Time:  2254  Group Topic/Focus:  Wrap-Up Group:   The focus of this group is to help patients review their daily goal of treatment and discuss progress on daily workbooks.  Participation Level: Did Not Attend  Participation Quality:  Not Applicable  Affect:  Not Applicable  Cognitive:  Not Applicable  Insight:  Not Applicable  Engagement in Group: Not Applicable  Additional Comments:  The patient did not attend group.   Hazle Coca S 01/14/2021, 10:54 PM

## 2021-01-15 LAB — GLUCOSE, CAPILLARY: Glucose-Capillary: 235 mg/dL — ABNORMAL HIGH (ref 70–99)

## 2021-01-15 MED ORDER — ALBUTEROL SULFATE HFA 108 (90 BASE) MCG/ACT IN AERS: 1.0000 | INHALATION_SPRAY | RESPIRATORY_TRACT | 0 refills | Status: DC | PRN

## 2021-01-15 MED ORDER — METFORMIN HCL 500 MG PO TABS
500.0000 mg | ORAL_TABLET | ORAL | Status: AC
  Administered 2021-01-15: 500 mg via ORAL

## 2021-01-15 MED ORDER — METFORMIN HCL 500 MG PO TABS: 500.0000 mg | ORAL_TABLET | Freq: Two times a day (BID) | ORAL | 0 refills | Status: DC

## 2021-01-15 MED ORDER — METFORMIN HCL 500 MG PO TABS
500.0000 mg | ORAL_TABLET | Freq: Two times a day (BID) | ORAL | Status: DC
  Filled 2021-01-15 (×2): qty 1

## 2021-01-15 MED ORDER — LEVOTHYROXINE SODIUM 50 MCG PO TABS: 50.0000 ug | ORAL_TABLET | Freq: Every day | ORAL | 0 refills | Status: DC

## 2021-01-15 MED ORDER — SUMATRIPTAN SUCCINATE 50 MG PO TABS: 50.0000 mg | ORAL_TABLET | ORAL | 0 refills | Status: DC | PRN

## 2021-01-15 MED ORDER — DULOXETINE HCL 30 MG PO CPEP: 30.0000 mg | ORAL_CAPSULE | Freq: Every day | ORAL | 0 refills | Status: DC
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
## 2021-01-15 NOTE — Progress Notes (Signed)
Recreation Therapy Notes  Date: 3.9.22 Time: 0930 Location: [address] Dayroom  Group Topic: Stress Management  Goal Area(s) Addresses:  Patient will identify positive stress management techniques. Patient will identify benefits of using stress management post d/c.  Behavioral Response: Engaged  Intervention: Stress Management  Activity:  Meditation.  LRT played a meditation that focused on taking on the characteristics of a mountain by standing strong in the face of adversity.    Education:  Stress Management, Discharge Planning.   Education Outcome: Acknowledges Education  Clinical Observations/Feedback: Pt attended and participated in group activity.    Caroll Rancher, LRT/CTRS         Lillia Abed, Marjette A 01/15/2021 11:15 AM

## 2021-01-15 NOTE — Progress Notes (Signed)
Patient educated about follow up care and upcoming appointments. Patient verbalizes understanding of all follow up appointments. AVS and suicide safety plan reviewed. Patient expresses no concerns or questions at this time. Educated on prescriptions and medication regimen. Patient belongings returned from locker #23. Patient denies SI, HI, AVH at this time. Educated patient about suicide help resources and hotline, encouraged to call for assistance in the event of a crisis. Patient agrees. Patient is ambulatory and safe at time of discharge. Patient discharged to hospital lobby at this time.

## 2021-01-15 NOTE — [facility] Suicide Risk Assessment (Signed)
[facility] Discharge Suicide Risk Assessment   Principal Problem: MDD (major depressive disorder), recurrent episode, severe (HCC) Discharge Diagnoses: Principal Problem:   MDD (major depressive disorder), recurrent episode, severe (HCC) Active Problems:   Rheumatoid arthritis (HCC)   Anxiety   Agoraphobia   Asthma   Cannabis use disorder, moderate, dependence (HCC)   Hypothyroidism   Migraine with aura   PTSD (post-traumatic stress disorder)   Attention deficit hyperactivity disorder, predominantly inattentive type   Total Time spent with patient: 15 minutes  Musculoskeletal: Strength & Muscle Tone: within normal limits Gait & Station: normal Patient leans: N/A  Psychiatric Specialty Exam: Review of Systems  Musculoskeletal: Positive for arthralgias, joint swelling and myalgias.  Neurological: Positive for dizziness and headaches.  All other systems reviewed and are negative.   Blood pressure 122/90, pulse (!) 111, temperature 97.7 F (36.5 C), temperature source Oral, resp. rate 18, height 5\' 4"  (1.626 m), weight 93 kg, SpO2 98 %.Body mass index is 35.19 kg/m.  General Appearance: Fairly Groomed  ::  Good  Speech:  Normal Rate409  Volume:  Normal  Mood:  Anxious  Affect:  Congruent  Thought Process:  Coherent and Descriptions of Associations: Intact  Orientation:  Full (Time, Place, and Person)  Thought Content:  Logical  Suicidal Thoughts:  No  Homicidal Thoughts:  No  Memory:  Immediate;   Fair Recent;   Fair Remote;   Fair  Judgement:  Intact  Insight:  Fair  Psychomotor Activity:  Increased  Concentration:  Fair  Recall:  Good  Fund of Knowledge:Good  Language: Good  Akathisia:  Negative  Handed:  Right  AIMS (if indicated):     Assets:  Communication Skills Desire for Improvement Housing Resilience Social Support Talents/Skills  Sleep:  Number of Hours: 5.25  Cognition: WNL  ADL's:  Intact   Mental Status Per Nursing Assessment::   On  Admission:  Suicidal ideation indicated by patient,Plan includes specific time, place, or method,Intention to act on suicide plan,Self-harm thoughts,Belief that plan would result in death,Suicide plan  Demographic Factors:  Divorced or widowed, Caucasian, Low socioeconomic status and Living alone  Loss Factors: Loss of significant relationship, Decline in physical health and Financial problems/change in socioeconomic status  Historical Factors: Impulsivity  Risk Reduction Factors:   Sense of responsibility to family  Continued Clinical Symptoms:  Severe Anxiety and/or Agitation Depression:   Impulsivity More than one psychiatric diagnosis  Cognitive Features That Contribute To Risk:  None    Suicide Risk:  Minimal: No identifiable suicidal ideation.  Patients presenting with no risk factors but with morbid ruminations; may be classified as minimal risk based on the severity of the depressive symptoms   Follow-up Information    [facility]. Go on 01/22/2021.   Specialty: Behavioral Health Why: You have a walk in appointment on 01/22/21 at 7:45 for therapy services  You also have a walk in appointment for medication management on 02/03/21 at 7:45 am. Walk in appointments are first come, first served and are held in person. Contact information: [address] [place] [phone]       [facility]. Go on 02/18/2021.   Why: You have a hospital follow up appointment for primary care services on 02/18/21 at 9:30 am.  This appointment will be held in person.  Please arrive 10 minutes prior to your appointment. Contact information: [address] [place] [phone]  Plan Of Care/Follow-up recommendations:  Activity:  ad lib  Antonieta Pert, MD 01/15/2021, 11:13 AM

## 2021-01-15 NOTE — Progress Notes (Signed)
   01/15/21 1100  Psych Admission Type (Psych Patients Only)  Admission Status Involuntary  Psychosocial Assessment  Patient Complaints Anxiety  Eye Contact Fair  Facial Expression Animated  Affect Appropriate to circumstance  Speech Logical/coherent  Interaction Assertive  Motor Activity Fidgety  Appearance/Hygiene Unremarkable  Behavior Characteristics Cooperative  Mood Euthymic  Thought Process  Coherency WDL  Content WDL  Delusions None reported or observed  Perception WDL  Hallucination None reported or observed  Judgment WDL  Confusion None  Danger to Self  Current suicidal ideation? Denies  Danger to Others  Danger to Others None reported or observed

## 2021-01-15 NOTE — Progress Notes (Signed)
  [facility] Adult Case Management Discharge Plan :  Will you be returning to the same living situation after discharge:  Yes,  Home At discharge, do you have transportation home?: Yes,  Own car Do you have the ability to pay for your medications: Yes,  Family   Release of information consent forms completed and in the chart;  Patient's signature needed at discharge.  Patient to Follow up at:  Follow-up Information    [facility]. Go on 01/22/2021.   Specialty: Behavioral Health Why: You have a walk in appointment on 01/22/21 at 7:45 for therapy services  You also have a walk in appointment for medication management on 02/03/21 at 7:45 am. Walk in appointments are first come, first served and are held in person. Contact information: [address] [place] [address] [phone]       [facility]. Go on 02/18/2021.   Why: You have a hospital follow up appointment for primary care services on 02/18/21 at 9:30 am.  This appointment will be held in person.  Please arrive 10 minutes prior to your appointment. Contact information: [address] [place] [address] [phone]              Next level of care provider has access to [facility] Link:yes  Safety Planning and Suicide Prevention discussed: Yes,  with patient  Have you used any form of tobacco in the last 30 days? (Cigarettes, Smokeless Tobacco, Cigars, and/or Pipes): No  Has patient been referred to the Quitline?: N/A patient is not a smoker  Patient has been referred for addiction treatment: N/A  [name], LCSWA 01/15/2021, 10:48 AM

## 2021-01-29 ENCOUNTER — Ambulatory Visit: Payer: Self-pay | Admitting: Internal Medicine

## 2021-01-29 ENCOUNTER — Telehealth: Payer: Self-pay | Admitting: Internal Medicine

## 2021-01-29 NOTE — Telephone Encounter (Signed)
Patient no showed her appt after discharge from [facility] for involuntary hospitalization. Called and left message asking her to call us in next 24 hours so we can make sure she is safe. She did confirm her appt for today.  I do see she was seen at an ED out of town on 01/24/21.  Does not appear she was hospitalized, but apparently felt to have a dystonic reaction to one of her meds.    Consider a safety check if she does not call back.

## 2021-01-30 NOTE — Telephone Encounter (Signed)
01/30/2021:  Called again today.  Only able to leave a message to call the clinic.  Left message if she did not call, would send out a welfare check both yesterday and today. She has no emergency contact currently.   1 p.m.:  Called [place] [employer] at [phone] and spoke with Deputy operator Adam [phone] to give information for a welfare check at her home in [place].

## 2021-02-18 ENCOUNTER — Inpatient Hospital Stay: Payer: Medicaid Other | Admitting: Family Medicine

## 2021-05-29 ENCOUNTER — Emergency Department (HOSPITAL_COMMUNITY)
Admission: EM | Admit: 2021-05-29 | Discharge: 2021-05-30 | Disposition: A | Discharge status: Home / Self Care | Payer: Medicaid Other | Attending: Emergency Medicine | Admitting: Emergency Medicine

## 2021-05-29 ENCOUNTER — Encounter (HOSPITAL_COMMUNITY): Payer: Self-pay | Admitting: *Deleted

## 2021-05-29 DIAGNOSIS — Y900 Blood alcohol level of less than 20 mg/100 ml: Secondary | ICD-10-CM | POA: Insufficient documentation

## 2021-05-29 DIAGNOSIS — Z20822 Contact with and (suspected) exposure to covid-19: Secondary | ICD-10-CM | POA: Insufficient documentation

## 2021-05-29 DIAGNOSIS — J45909 Unspecified asthma, uncomplicated: Secondary | ICD-10-CM | POA: Insufficient documentation

## 2021-05-29 DIAGNOSIS — Z046 Encounter for general psychiatric examination, requested by authority: Secondary | ICD-10-CM | POA: Insufficient documentation

## 2021-05-29 DIAGNOSIS — R456 Violent behavior: Secondary | ICD-10-CM | POA: Insufficient documentation

## 2021-05-29 DIAGNOSIS — R45851 Suicidal ideations: Secondary | ICD-10-CM | POA: Insufficient documentation

## 2021-05-29 DIAGNOSIS — F39 Unspecified mood [affective] disorder: Secondary | ICD-10-CM | POA: Insufficient documentation

## 2021-05-29 DIAGNOSIS — E039 Hypothyroidism, unspecified: Secondary | ICD-10-CM | POA: Insufficient documentation

## 2021-05-29 DIAGNOSIS — Z79899 Other long term (current) drug therapy: Secondary | ICD-10-CM | POA: Insufficient documentation

## 2021-05-29 DIAGNOSIS — E876 Hypokalemia: Secondary | ICD-10-CM | POA: Insufficient documentation

## 2021-05-29 DIAGNOSIS — R4689 Other symptoms and signs involving appearance and behavior: Secondary | ICD-10-CM

## 2021-05-29 DIAGNOSIS — E119 Type 2 diabetes mellitus without complications: Secondary | ICD-10-CM | POA: Insufficient documentation

## 2021-05-29 DIAGNOSIS — F22 Delusional disorders: Secondary | ICD-10-CM | POA: Insufficient documentation

## 2021-05-29 DIAGNOSIS — Z7984 Long term (current) use of oral hypoglycemic drugs: Secondary | ICD-10-CM | POA: Insufficient documentation

## 2021-05-29 LAB — COMPREHENSIVE METABOLIC PANEL
ALT: 51 U/L — ABNORMAL HIGH (ref 0–44)
AST: 45 U/L — ABNORMAL HIGH (ref 15–41)
Albumin: 4.3 g/dL (ref 3.5–5.0)
Alkaline Phosphatase: 73 U/L (ref 38–126)
Anion gap: 11 (ref 5–15)
BUN: 13 mg/dL (ref 6–20)
CO2: 23 mmol/L (ref 22–32)
Calcium: 9 mg/dL (ref 8.9–10.3)
Chloride: 105 mmol/L (ref 98–111)
Creatinine, Ser: 0.82 mg/dL (ref 0.44–1.00)
GFR, Estimated: >60 mL/min (ref >60)
Glucose, Bld: 153 mg/dL — ABNORMAL HIGH (ref 70–99)
Potassium: 3.4 mmol/L — ABNORMAL LOW (ref 3.5–5.1)
Sodium: 139 mmol/L (ref 135–145)
Total Bilirubin: 0.8 mg/dL (ref 0.3–1.2)
Total Protein: 7.6 g/dL (ref 6.5–8.1)

## 2021-05-29 LAB — CBC
HCT: 41.4 % (ref 36.0–46.0)
Hemoglobin: 14.7 g/dL (ref 12.0–15.0)
MCH: 31.3 pg (ref 26.0–34.0)
MCHC: 35.5 g/dL (ref 30.0–36.0)
MCV: 88.1 fL (ref 80.0–100.0)
Platelets: 287 10*3/uL (ref 150–400)
RBC: 4.7 MIL/uL (ref 3.87–5.11)
RDW: 12.1 % (ref 11.5–15.5)
WBC: 8.2 10*3/uL (ref 4.0–10.5)
nRBC: 0 % (ref 0.0–0.2)

## 2021-05-29 LAB — I-STAT BETA HCG BLOOD, ED (MC, WL, AP ONLY): I-stat hCG, quantitative: <5 m[IU]/mL (ref <5)

## 2021-05-29 LAB — SALICYLATE LEVEL: Salicylate Lvl: <7 mg/dL — ABNORMAL LOW (ref 7.0–30.0)

## 2021-05-29 LAB — ETHANOL: Alcohol, Ethyl (B): <10 mg/dL (ref <10)

## 2021-05-29 LAB — ACETAMINOPHEN LEVEL: Acetaminophen (Tylenol), Serum: <10 ug/mL — ABNORMAL LOW (ref 10–30)

## 2021-05-29 MED ORDER — OLANZAPINE 10 MG PO TBDP
10.0000 mg | ORAL_TABLET | Freq: Once | ORAL | Status: DC
  Filled 2021-05-29: qty 1

## 2021-05-29 MED ORDER — ZIPRASIDONE MESYLATE 20 MG IM SOLR
20.0000 mg | Freq: Once | INTRAMUSCULAR | Status: AC
  Administered 2021-05-29: 20 mg via INTRAMUSCULAR
  Filled 2021-05-29: qty 20

## 2021-05-29 MED ORDER — LORAZEPAM 1 MG PO TABS
1.0000 mg | ORAL_TABLET | Freq: Once | ORAL | Status: AC
  Administered 2021-05-29: 1 mg via ORAL
  Filled 2021-05-29: qty 1

## 2021-05-29 MED ORDER — OLANZAPINE 10 MG PO TBDP: 10.0000 mg | ORAL_TABLET | Freq: Every day | ORAL | Status: DC

## 2021-05-29 NOTE — ED Notes (Signed)
Velvet Bathe patients boyfriend that had patient IVC'd  Any additional information please contact him at  [phone]
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
## 2021-05-29 NOTE — ED Notes (Signed)
Pt refused vitals 

## 2021-05-29 NOTE — ED Notes (Signed)
Pt refusing bloodwork, provider made aware

## 2021-05-29 NOTE — ED Notes (Signed)
Pt threw cup of water on floor, will hold off on giving pt fluids until she stops throwing liquids on floor and creating a fall risk for staff and patients

## 2021-05-29 NOTE — ED Notes (Signed)
Officer told me not to draw her blood at this time

## 2021-05-29 NOTE — ED Notes (Signed)
Patient Refuse to do the rest of her vitals which was BP.  However, patient was in a bad mood and calling the Charge Nurse bad names because she let know that in order to eat you must have labs done first. I was to give the main vitals Temp, Pulse, Resp, and O2 Stat from the Patient.

## 2021-05-29 NOTE — ED Notes (Signed)
Patient throwing water bottles and water cup. Also, flipping bedside table tray. Tray removed out of reach of patient.

## 2021-05-29 NOTE — ED Notes (Signed)
Pt continuing to refuse meds and lab works, stating "you'll have to restrain me!" Pt uncooperative and will not take antipsychotic meds because "I don't want to be put on a new medication like that, they make you gain weight and it takes so long to get off of them". Attempted to reassure pt it is only a 1 time dose and we just need to get her lab work, but pt still refusing. Provider and charge RN aware

## 2021-05-29 NOTE — ED Notes (Signed)
Pt flipped entire bedside table, table removed from pt's vicinity, will continue to monitor

## 2021-05-29 NOTE — ED Notes (Signed)
Pt still refusing vitals.  

## 2021-05-29 NOTE — BH Assessment (Signed)
Clinician asked Annie Sable, RN to let her know when the pt is ready to be assessed.   Redmond Pulling, MS, [facility], [facility] Triage Specialist [phone]
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
## 2021-05-30 ENCOUNTER — Inpatient Hospital Stay (HOSPITAL_COMMUNITY)
Admission: AD | Admit: 2021-05-30 | Discharge: 2021-06-05 | DRG: 885 | Disposition: A | Discharge status: Home / Self Care | Payer: Federal, State, Local not specified - Other | Source: Intra-hospital | Attending: Emergency Medicine | Admitting: Emergency Medicine

## 2021-05-30 ENCOUNTER — Other Ambulatory Visit: Payer: Self-pay | Admitting: Psychiatry

## 2021-05-30 DIAGNOSIS — Z9151 Personal history of suicidal behavior: Secondary | ICD-10-CM

## 2021-05-30 DIAGNOSIS — E039 Hypothyroidism, unspecified: Secondary | ICD-10-CM | POA: Diagnosis present

## 2021-05-30 DIAGNOSIS — Z79899 Other long term (current) drug therapy: Secondary | ICD-10-CM | POA: Diagnosis not present

## 2021-05-30 DIAGNOSIS — Z7984 Long term (current) use of oral hypoglycemic drugs: Secondary | ICD-10-CM

## 2021-05-30 DIAGNOSIS — E119 Type 2 diabetes mellitus without complications: Secondary | ICD-10-CM | POA: Diagnosis present

## 2021-05-30 DIAGNOSIS — K219 Gastro-esophageal reflux disease without esophagitis: Secondary | ICD-10-CM | POA: Diagnosis present

## 2021-05-30 DIAGNOSIS — Z818 Family history of other mental and behavioral disorders: Secondary | ICD-10-CM | POA: Diagnosis not present

## 2021-05-30 DIAGNOSIS — F609 Personality disorder, unspecified: Secondary | ICD-10-CM | POA: Diagnosis present

## 2021-05-30 DIAGNOSIS — F6089 Other specific personality disorders: Secondary | ICD-10-CM | POA: Diagnosis present

## 2021-05-30 DIAGNOSIS — Z79890 Hormone replacement therapy (postmenopausal): Secondary | ICD-10-CM

## 2021-05-30 DIAGNOSIS — R45851 Suicidal ideations: Secondary | ICD-10-CM | POA: Diagnosis present

## 2021-05-30 DIAGNOSIS — F333 Major depressive disorder, recurrent, severe with psychotic symptoms: Principal | ICD-10-CM | POA: Diagnosis present

## 2021-05-30 DIAGNOSIS — F419 Anxiety disorder, unspecified: Secondary | ICD-10-CM | POA: Diagnosis not present

## 2021-05-30 DIAGNOSIS — F603 Borderline personality disorder: Secondary | ICD-10-CM | POA: Diagnosis present

## 2021-05-30 DIAGNOSIS — F39 Unspecified mood [affective] disorder: Secondary | ICD-10-CM | POA: Diagnosis present

## 2021-05-30 DIAGNOSIS — F4310 Post-traumatic stress disorder, unspecified: Secondary | ICD-10-CM | POA: Diagnosis present

## 2021-05-30 DIAGNOSIS — F1220 Cannabis dependence, uncomplicated: Secondary | ICD-10-CM | POA: Diagnosis present

## 2021-05-30 DIAGNOSIS — F29 Unspecified psychosis not due to a substance or known physiological condition: Secondary | ICD-10-CM | POA: Insufficient documentation

## 2021-05-30 DIAGNOSIS — R110 Nausea: Secondary | ICD-10-CM | POA: Diagnosis present

## 2021-05-30 DIAGNOSIS — G4700 Insomnia, unspecified: Secondary | ICD-10-CM | POA: Diagnosis present

## 2021-05-30 LAB — RESP PANEL BY RT-PCR (FLU A&B, COVID) ARPGX2
Influenza A by PCR: NEGATIVE
Influenza B by PCR: NEGATIVE
SARS Coronavirus 2 by RT PCR: NEGATIVE

## 2021-05-30 LAB — RAPID URINE DRUG SCREEN, HOSP PERFORMED
Amphetamines: NOT DETECTED
Barbiturates: NOT DETECTED
Benzodiazepines: POSITIVE — AB
Cocaine: NOT DETECTED
Opiates: NOT DETECTED
Tetrahydrocannabinol: POSITIVE — AB

## 2021-05-30 LAB — CBG MONITORING, ED: Glucose-Capillary: 164 mg/dL — ABNORMAL HIGH (ref 70–99)

## 2021-05-30 MED ORDER — MAGNESIUM HYDROXIDE 400 MG/5ML PO SUSP
30.0000 mL | Freq: Every day | ORAL | Status: DC | PRN
Start: 2021-05-30 — End: 2021-06-05

## 2021-05-30 MED ORDER — OLANZAPINE 10 MG PO TBDP: 10.0000 mg | ORAL_TABLET | Freq: Every day | ORAL | Status: DC

## 2021-05-30 MED ORDER — METFORMIN HCL 500 MG PO TABS
1000.0000 mg | ORAL_TABLET | Freq: Two times a day (BID) | ORAL | Status: DC
  Administered 2021-05-30: 1000 mg via ORAL
  Filled 2021-05-30: qty 2

## 2021-05-30 MED ORDER — CLINDAMYCIN PHOS-BENZOYL PEROX 1-5 % EX GEL: 1.0000 "application " | Freq: Two times a day (BID) | CUTANEOUS | Status: DC

## 2021-05-30 MED ORDER — HYDROXYZINE HCL 25 MG PO TABS: 25.0000 mg | ORAL_TABLET | Freq: Two times a day (BID) | ORAL | Status: DC | PRN

## 2021-05-30 MED ORDER — METFORMIN HCL 500 MG PO TABS: 1000.0000 mg | ORAL_TABLET | Freq: Two times a day (BID) | ORAL | Status: DC

## 2021-05-30 MED ORDER — ALUM & MAG HYDROXIDE-SIMETH 200-200-20 MG/5ML PO SUSP
30.0000 mL | ORAL | Status: DC | PRN
Start: 2021-05-30 — End: 2021-06-05
  Administered 2021-05-31: 30 mL via ORAL
  Filled 2021-05-30: qty 30

## 2021-05-30 MED ORDER — DOXYCYCLINE HYCLATE 100 MG PO TABS
100.0000 mg | ORAL_TABLET | Freq: Two times a day (BID) | ORAL | Status: DC
  Administered 2021-05-30: 100 mg via ORAL
  Filled 2021-05-30: qty 1

## 2021-05-30 MED ORDER — LEVOTHYROXINE SODIUM 50 MCG PO TABS
50.0000 ug | ORAL_TABLET | Freq: Every day | ORAL | Status: DC
  Administered 2021-05-31 – 2021-06-05 (×6): 50 ug via ORAL
  Filled 2021-05-30: qty 1
  Filled 2021-05-30: qty 2
  Filled 2021-05-30 (×6): qty 1

## 2021-05-30 MED ORDER — TRAZODONE HCL 50 MG PO TABS
50.0000 mg | ORAL_TABLET | Freq: Every evening | ORAL | Status: DC | PRN
  Administered 2021-05-31 – 2021-06-03 (×4): 50 mg via ORAL
  Filled 2021-05-30 (×5): qty 1

## 2021-05-30 MED ORDER — ALBUTEROL SULFATE HFA 108 (90 BASE) MCG/ACT IN AERS
2.0000 | INHALATION_SPRAY | RESPIRATORY_TRACT | Status: DC | PRN
Start: 2021-05-30 — End: 2021-05-30
  Administered 2021-05-30: 2 via RESPIRATORY_TRACT
  Filled 2021-05-30: qty 6.7

## 2021-05-30 MED ORDER — ACETAMINOPHEN 325 MG PO TABS
650.0000 mg | ORAL_TABLET | Freq: Four times a day (QID) | ORAL | Status: DC | PRN
  Administered 2021-05-31 – 2021-06-04 (×2): 650 mg via ORAL
  Filled 2021-05-30 (×2): qty 2

## 2021-05-30 MED ORDER — BISACODYL 5 MG PO TBEC
5.0000 mg | DELAYED_RELEASE_TABLET | Freq: Once | ORAL | Status: AC
  Administered 2021-05-30: 5 mg via ORAL
  Filled 2021-05-30: qty 1

## 2021-05-30 MED ORDER — ALBUTEROL SULFATE HFA 108 (90 BASE) MCG/ACT IN AERS
1.0000 | INHALATION_SPRAY | RESPIRATORY_TRACT | Status: DC | PRN
  Administered 2021-06-05: 1 via RESPIRATORY_TRACT
  Filled 2021-05-30: qty 6.7

## 2021-05-30 MED ORDER — HYDROXYZINE HCL 25 MG PO TABS
25.0000 mg | ORAL_TABLET | Freq: Three times a day (TID) | ORAL | Status: DC | PRN
  Administered 2021-05-31 – 2021-06-03 (×4): 25 mg via ORAL
  Filled 2021-05-30 (×4): qty 1

## 2021-05-30 MED ORDER — METFORMIN HCL 500 MG PO TABS
500.0000 mg | ORAL_TABLET | Freq: Two times a day (BID) | ORAL | Status: DC
  Filled 2021-05-30: qty 1

## 2021-05-30 MED ORDER — IBUPROFEN 200 MG PO TABS
400.0000 mg | ORAL_TABLET | Freq: Four times a day (QID) | ORAL | Status: DC | PRN
Start: 2021-05-30 — End: 2021-05-30
  Administered 2021-05-30: 400 mg via ORAL
  Filled 2021-05-30: qty 2
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
## 2021-05-30 NOTE — ED Notes (Signed)
Patient discharged in route to [facility] by [facility].  All belongings given to [facility].

## 2021-05-30 NOTE — ED Notes (Signed)
Pt refusing vitals.

## 2021-05-30 NOTE — ED Notes (Signed)
BH counselors at the bedside for assessment

## 2021-05-30 NOTE — ED Notes (Signed)
Appears to be sleeping.  Safety sitter is at the bedside

## 2021-05-30 NOTE — BH Assessment (Signed)
Clinician messaged Annie Sable, RN to let her know when the pt is ready to be assessed. If so she will need to be placed in a private room.  Per RN: "she is sleeping, the Geodon has kept her pretty sedated."   Clinician asked RN to let us know when the pt is able to engage.   Redmond Pulling, MS, [facility], [facility] Triage Specialist [phone]
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
## 2021-05-30 NOTE — ED Notes (Signed)
Boyfriend would like TTS to call him, Velvet Bathe, [phone] has information for you.

## 2021-05-30 NOTE — ED Notes (Signed)
Pt to room 29. Pt alert and calm. Pt oriented to unit and room.

## 2021-05-30 NOTE — ED Notes (Signed)
Pt awake and alert.  TTS wanting to engage with pt. Pt refusing to participate.  I explained/educated pt on importance of participating.  While explaining to pt the importance of participating with TTS, pt began to become tearful and became louder saying NO!Marland Kitchen  Passed this information along to charge nurse Leta Jungling and to TTS, [place], [facility].

## 2021-05-30 NOTE — ED Notes (Signed)
Pt refused medication . Pt states does not want to be on a long term medication. Pt wants help getting out of the current situation and medication is not going to help.

## 2021-05-30 NOTE — ED Notes (Signed)
Pt refusing to allow RN to check vital signs, pt currently agitated and tearful.  Gave pt water per request.  Pt became loud stating NO when attempting to check vitals.

## 2021-05-30 NOTE — ED Notes (Signed)
Patient's boyfriend called to speak with patient.  Patient does not want to speak with him and does not want him involved with her treatment plan.

## 2021-05-31 ENCOUNTER — Other Ambulatory Visit: Payer: Self-pay

## 2021-05-31 ENCOUNTER — Encounter (HOSPITAL_COMMUNITY): Payer: Self-pay | Admitting: Psychiatry

## 2021-05-31 DIAGNOSIS — F419 Anxiety disorder, unspecified: Secondary | ICD-10-CM

## 2021-05-31 DIAGNOSIS — F333 Major depressive disorder, recurrent, severe with psychotic symptoms: Principal | ICD-10-CM

## 2021-05-31 DIAGNOSIS — F1220 Cannabis dependence, uncomplicated: Secondary | ICD-10-CM

## 2021-05-31 DIAGNOSIS — F609 Personality disorder, unspecified: Secondary | ICD-10-CM

## 2021-05-31 LAB — LIPID PANEL
Cholesterol: 165 mg/dL (ref 0–200)
HDL: 45 mg/dL (ref >40)
LDL Cholesterol: 92 mg/dL (ref 0–99)
Total CHOL/HDL Ratio: 3.7 RATIO
Triglycerides: 138 mg/dL (ref <150)
VLDL: 28 mg/dL (ref 0–40)

## 2021-05-31 LAB — COMPREHENSIVE METABOLIC PANEL
ALT: 49 U/L — ABNORMAL HIGH (ref 0–44)
AST: 35 U/L (ref 15–41)
Albumin: 4.2 g/dL (ref 3.5–5.0)
Alkaline Phosphatase: 74 U/L (ref 38–126)
Anion gap: 11 (ref 5–15)
BUN: 9 mg/dL (ref 6–20)
CO2: 22 mmol/L (ref 22–32)
Calcium: 9.1 mg/dL (ref 8.9–10.3)
Chloride: 104 mmol/L (ref 98–111)
Creatinine, Ser: 0.79 mg/dL (ref 0.44–1.00)
GFR, Estimated: >60 mL/min (ref >60)
Glucose, Bld: 199 mg/dL — ABNORMAL HIGH (ref 70–99)
Potassium: 3.5 mmol/L (ref 3.5–5.1)
Sodium: 137 mmol/L (ref 135–145)
Total Bilirubin: 0.6 mg/dL (ref 0.3–1.2)
Total Protein: 7.7 g/dL (ref 6.5–8.1)

## 2021-05-31 LAB — TSH: TSH: 4.651 u[IU]/mL — ABNORMAL HIGH (ref 0.350–4.500)

## 2021-05-31 LAB — T4, FREE: Free T4: 0.94 ng/dL (ref 0.61–1.12)

## 2021-05-31 LAB — GLUCOSE, CAPILLARY: Glucose-Capillary: 149 mg/dL — ABNORMAL HIGH (ref 70–99)

## 2021-05-31 MED ORDER — WHITE PETROLATUM EX OINT
TOPICAL_OINTMENT | CUTANEOUS | Status: AC
  Filled 2021-05-31: qty 5

## 2021-05-31 MED ORDER — PANTOPRAZOLE SODIUM 40 MG PO TBEC
DELAYED_RELEASE_TABLET | ORAL | Status: AC
  Administered 2021-05-31: 40 mg via ORAL
  Filled 2021-05-31: qty 1

## 2021-05-31 MED ORDER — LORAZEPAM 1 MG PO TABS
1.0000 mg | ORAL_TABLET | ORAL | Status: AC | PRN
  Administered 2021-06-01: 1 mg via ORAL
  Filled 2021-05-31: qty 1

## 2021-05-31 MED ORDER — LORAZEPAM 1 MG PO TABS: 1.0000 mg | ORAL_TABLET | Freq: Four times a day (QID) | ORAL | Status: DC | PRN

## 2021-05-31 MED ORDER — METFORMIN HCL 500 MG PO TABS
1000.0000 mg | ORAL_TABLET | Freq: Two times a day (BID) | ORAL | Status: DC
  Administered 2021-05-31 – 2021-06-01 (×2): 1000 mg via ORAL
  Filled 2021-05-31 (×9): qty 2

## 2021-05-31 MED ORDER — LOPERAMIDE HCL 2 MG PO CAPS: 2.0000 mg | ORAL_CAPSULE | ORAL | Status: AC | PRN

## 2021-05-31 MED ORDER — DULOXETINE HCL 30 MG PO CPEP
30.0000 mg | ORAL_CAPSULE | Freq: Every day | ORAL | Status: DC
  Administered 2021-06-01 – 2021-06-05 (×4): 30 mg via ORAL
  Filled 2021-05-31 (×6): qty 1

## 2021-05-31 MED ORDER — ONDANSETRON 4 MG PO TBDP
4.0000 mg | ORAL_TABLET | Freq: Three times a day (TID) | ORAL | Status: DC | PRN
Start: 2021-05-31 — End: 2021-05-31

## 2021-05-31 MED ORDER — ONDANSETRON 4 MG PO TBDP: 4.0000 mg | ORAL_TABLET | Freq: Four times a day (QID) | ORAL | Status: AC | PRN

## 2021-05-31 MED ORDER — ZIPRASIDONE MESYLATE 20 MG IM SOLR
20.0000 mg | INTRAMUSCULAR | Status: AC | PRN
  Administered 2021-06-03: 20 mg via INTRAMUSCULAR
  Filled 2021-05-31 (×2): qty 20

## 2021-05-31 MED ORDER — PANTOPRAZOLE SODIUM 40 MG PO TBEC
40.0000 mg | DELAYED_RELEASE_TABLET | Freq: Every day | ORAL | Status: DC
  Administered 2021-05-31 – 2021-06-05 (×6): 40 mg via ORAL
  Filled 2021-05-31 (×6): qty 1

## 2021-05-31 MED ORDER — QUETIAPINE FUMARATE 50 MG PO TABS
50.0000 mg | ORAL_TABLET | Freq: Every day | ORAL | Status: DC
Start: 2021-05-31 — End: 2021-06-03
  Administered 2021-05-31 – 2021-06-02 (×3): 50 mg via ORAL
  Filled 2021-05-31 (×6): qty 1

## 2021-05-31 MED ORDER — OLANZAPINE 5 MG PO TBDP
5.0000 mg | ORAL_TABLET | Freq: Three times a day (TID) | ORAL | Status: DC | PRN
  Administered 2021-06-01 – 2021-06-04 (×7): 5 mg via ORAL
  Filled 2021-05-31 (×7): qty 1

## 2021-05-31 NOTE — BHH Counselor (Signed)
CSW attempted to complete PSA with pt but pt declined to participate stating she has a headache and stomach ache.   Fredirick Lathe, LCSWA Clinicial Social Worker [employer]

## 2021-05-31 NOTE — Tx Team (Signed)
Initial Treatment Plan 05/31/2021 12:15 AM [name] [id number]    PATIENT STRESSORS: Financial difficulties Legal issue Marital or family conflict Medication change or noncompliance   PATIENT STRENGTHS: General fund of knowledge Motivation for treatment/growth   PATIENT IDENTIFIED PROBLEMS: Delusions  Paranoia  "Nothing"                 DISCHARGE CRITERIA:  Adequate post-discharge living arrangements Verbal commitment to aftercare and medication compliance  PRELIMINARY DISCHARGE PLAN: Attend aftercare/continuing care group Attend PHP/IOP Outpatient therapy  PATIENT/FAMILY INVOLVEMENT: This treatment plan has been presented to and reviewed with the patient, Erika Rhodes .  The patient and family have been given the opportunity to ask questions and make suggestions.  Delos Haring, RN 05/31/2021, 12:15 AM

## 2021-05-31 NOTE — Progress Notes (Signed)
D- Patient alert and oriented. Affect/mood good. Denies SI, HI, and AVH. Patient stated that her pain was a 7/10 in her abdomen area; medication was given as ordered. Staff will continue to monitor for effectiveness.   A- Scheduled medications administered to patient, per MD orders. Support and encouragement provided.  Routine safety checks conducted every 15 minutes.  Patient informed to notify staff with problems or concerns.  R- No adverse drug reactions noted. Patient contracts for safety at this time. Patient compliant with medications and treatment plan. Patient receptive, calm, and cooperative. Patient interacts well with others on the unit.  Patient remains safe at this time.   05/31/21 1320  Charting Type  Charting Type Shift assessment  Assessment of needs addressed Yes  Orders Chart Check (once per shift) Completed  Safety Check Verification  Has the RN verified the 15 minute safety check completion? Yes  Neurological  Neuro (WDL) WDL  HEENT  HEENT (WDL) WDL  Respiratory  Respiratory (WDL) X (See initial assignment)  Cardiac  Cardiac (WDL) WDL  Vascular  Vascular (WDL) WDL  Braden Scale (Ages 8 and up)  Sensory Perceptions 4  Moisture 4  Activity 4  Mobility 4  Nutrition 3  Friction and Shear 3  Braden Scale Score 22  Musculoskeletal  Musculoskeletal (WDL) X (see initial assessment)  Gastrointestinal  Gastrointestinal (WDL) X (c/o stomsch ache)  GU Assessment  Genitourinary (WDL) WDL
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
## 2021-05-31 NOTE — [facility] Group Notes (Signed)
[facility] Group Notes: (Clinical Social Work)   05/31/2021      Type of Therapy:  Group Therapy   Participation Level:  Did Not Attend - was invited both individually by [facility] and by overhead announcement, chose not to attend.   Ander Wamser Grossman-Orr, [name] 05/31/2021, 11:58 AM    

## 2021-05-31 NOTE — Progress Notes (Signed)
Pt did not attend orientation/goals group. 

## 2021-05-31 NOTE — [facility] Group Notes (Signed)
Adult Psychoeducational Group Note  Date:  05/31/2021 Time:  8:37 PM  Group Topic/Focus:  Coping With Mental Health Crisis:   The purpose of this group is to help patients identify strategies for coping with mental health crisis.  Group discusses possible causes of crisis and ways to manage them effectively.  Participation Level:  Active  Participation Quality:  Appropriate and Attentive  Affect:  Appropriate  Cognitive:  Alert  Insight: Appropriate  Engagement in Group:  Engaged  Modes of Intervention:  Discussion  Additional Comments  [name] 05/31/2021, 8:37 PM
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
## 2021-05-31 NOTE — Progress Notes (Addendum)
Patient given PRN Maalox as ordered for c/o heartburn/ indigestion. Staff will continue to monitor for effectiveness. Patient stated that she did not eat lunch. Patient is up and walking the hallway.
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
## 2021-05-31 NOTE — Progress Notes (Signed)
Pt came in at 0200    05/31/21 0500  Sleep  Number of Hours 3.5

## 2021-05-31 NOTE — Progress Notes (Signed)
Patient stated that she is "feeling much better". Patient was observed standing in the hallway for approximately 30-45 minutes talking with another patient with no signs of distress. Staff will continue to monitor for changes in condition and behavior.

## 2021-05-31 NOTE — Progress Notes (Addendum)
Notified by nursing staff and Dr. Mason Jim that patient was experiencing abdominal pain earlier today on 05/31/2021 and that patient had made statements earlier today on 05/31/2021 that patient thought she was pregnant and in labor. I was asked by [facility] and nursing staff to evaluate the patient. Patient seen and examined by myself with RN present during my examination.   Patient states that she began experiencing diffuse abdominal pain yesterday on 05/30/2021, in which patient characterized her abdominal pain as the feeling that she was in labor.  Patient states that she had her fallopian tubes removed in 2018 and had her uterus removed in 2019 and she states that she still has her ovaries.  Per chart review, patient had an abdominal hysterectomy in 2017 with ovaries still intact.  Chart review also shows that patient had I-stat HCG, quantitative labs done on 05/29/2021, which was within normal limits at <5.0 MIU/mL (ref. Range <5 mIU/mL).  Patient took Maalox 30 mL on 05/31/2021 at 1607.  Patient states that she believes her abdominal pain may be attributed to her metformin.  Patient took her morning dose of metformin 1000 mg p.o. on 05/31/2021 at 0816, but did not take her evening dose of metformin 1000 mg p.o. on 05/31/2021 due to concerns of it contributing to/causing her abdominal pain. Patient states that she is feeling better this evening compared to how she was feeling earlier today.  She rates her abdominal pain as a 4 out of 10 currently on exam and states that her abdominal pain has much improved since earlier today.  Patient does not provide details regarding characterization of her abdominal pain at this time. Patient denies nausea, vomiting, constipation, diarrhea, melena, hematochezia, fevers, chills, dysuria, hematuria, vaginal/pelvic pain, chest pain, shortness of breath, headache, lightheadedness, dizziness, loss of consciousness, or any additional physical symptoms at this time aside from her 4 out  of 10 abdominal pain noted above.  Patient endorses having nausea yesterday on 05/30/2021, but she denies any vomiting at that time.  Patient states that she ate 1 cup of vegetables and one fruit cup for dinner earlier this evening on 05/31/2021 and tolerated these foods without any complications.  On exam, patient is eating a snack and drinking comfortably.  Patient states that she last had a bowel movement around 5/6 PM on 05/31/2021 and patient states that this bowel movement was normal. Per nursing staff, patient was ambulating comfortably on the unit earlier today with no complaints at that time.  Patient's most recent vital signs appear stable: BP 131/98, pulse 105, SPO2 100%.  On heart auscultation, mild tachycardia noted with regular rhythm, no murmurs, rubs, or gallops noted. Patient's lungs are CTA in bilateral anterior and posterior lung fields, no respiratory distress noted, normal respiratory effort. On abdominal exam, abdomen soft, non-tender to palpation, and non-distended (patient reports that she believes her abdomen has become distended over the past few months, but patient's abdomen does not appear distended to me on exam), bowel sounds present in all 4 quadrants and epigastrium, abdomen tympanic to percussion.  No abdominal bruits noted.  No CVA tenderness on exam.   On exam, patient is resting comfortably in her bed, in no acute distress.  Patient states that her abdominal pain is improving and that she would like to try to get some rest and see how she feels on the morning of 06/01/2021.  Based on patient's current presentation, my personal assessment of the patient, information obtained from nursing staff, and my clinical judgment, I  do not believe that the patient's abdominal pain is emergent at this time and do not believe that the patient is experiencing an emergency medical condition at this time.  Thus, do not believe that transfer of the patient to the ED is necessary at this time.   Patient to remain at [facility] at this time.  Additionally, based on patient's current presentation, patient's medical/surgical history mentioned above, and patient's 05/29/2021 I-State beta hCG results noted above, do not suspect pregnancy at this time and do not believe that an additional pregnancy test or additional medical work-up is needed at this time.  Patient currently has the following medications ordered that she can take for GI symptoms:   -Maalox/Mylanta 30 mL p.o. every 4 hours as needed for indigestion  -Milk of Magnesia 30 mL p.o. daily as needed for mild constipation  -Zofran disintegrating tablet 4 mg p.o. every 6 hours as needed for nausea/vomiting  -Protonix 40 mg p.o. daily for GERD-like symptoms  Will leave patient's metformin 1000 mg p.o. twice daily with meals ordered as it is for now with the hope that patient will be able to resume taking her metformin on 06/01/2021 as her abdominal pain continues to improve.  Nursing to continue to monitor patient per unit routine of q 15 minute checks for safety.  Patient to notify nursing staff if her symptoms worsen or if any additional issues arise.

## 2021-06-01 LAB — HEPATITIS PANEL, ACUTE
HCV Ab: NONREACTIVE
Hep A IgM: NONREACTIVE
Hep B C IgM: NONREACTIVE
Hepatitis B Surface Ag: NONREACTIVE

## 2021-06-01 LAB — GLUCOSE, CAPILLARY: Glucose-Capillary: 133 mg/dL — ABNORMAL HIGH (ref 70–99)

## 2021-06-01 NOTE — Progress Notes (Signed)
D- Patient alert and oriented. Affect/mood reported as improving.Denies SI, HI, AVH, and pain.   A- Scheduled medications administered to patient, per MD orders. Support and encouragement provided.  Routine safety checks conducted every 15 minutes.  Patient informed to notify staff with problems or concerns.  R- No adverse drug reactions noted. Patient contracts for safety at this time. Patient compliant with medications and treatment plan. Patient receptive, calm, and cooperative. Patient interacts well with others on the unit.  Patient remains safe at this time.    06/01/21 1105  Charting Type  Charting Type Shift assessment  Assessment of needs addressed Yes  Orders Chart Check (once per shift) Completed  Safety Check Verification  Has the RN verified the 15 minute safety check completion? Yes  Neurological  Neuro (WDL) WDL  HEENT  HEENT (WDL) WDL  Respiratory  Respiratory (WDL) X  Cardiac  Cardiac (WDL) WDL  Braden Scale (Ages 8 and up)  Sensory Perceptions 4  Moisture 4  Activity 4  Mobility 4  Nutrition 3  Friction and Shear 3  Braden Scale Score 22  Musculoskeletal  Musculoskeletal (WDL) WDL  Gastrointestinal  Gastrointestinal (WDL) WDL

## 2021-06-01 NOTE — Progress Notes (Signed)
Patient in dayroom this evening attended programing interacting well with Peers and staff. Ate 100% of her bedtime snack. Stated she had abdominal pain earlier due to her taking metformin " I don't care what anyone says I know its the metformin that made my stomach hurt I feel much better now. I am pregnant it felt like I was giving birth. I took some Maalox and it helped me a lot. Patient educated on metformin was not very receptive. Provider notified.   Support and encouragement provided as needed. Patient slept did not complain of pain or discomfort during the night.

## 2021-06-01 NOTE — Progress Notes (Signed)
[facility] MD Progress Note  06/01/2021 1:54 PM Erika Rhodes  MRN:  [id number] Subjective:  "I have good news for you, I slept good last night."   Objective: Erika Rhodes is a 40 year old female who presented to [facility], under IVC, after acting erratically at home. According to IVC paperwork taken out by her boyfriend, Velvet Bathe [phone], she has not been taking her medications, breaking dishes and believes her cats are spying on her and reporting to the government. Patient has a past psychiatric history significant for PTSD, MDD, Borderline personality traits and cannabis use disorder. She has has previous suicide attempts by overdose and cutting, her last attempt was several months ago by taking pills. She was inpatient at [facility] in March 2022. Her UDS was positive for THC and benzodiazepine (hospital given).  Evaluation on the unit: Patient was seen and evaluated, chart reviewed. Patient is in her room, asleep this morning. Patient stated she slept well last night, record shows she slept 7.75 hours last night. Patient stated she ate breakfast this morning. She denies SI/HI/AVH, paranoia and delusions. She denies she thought her cats were robots and spying on her. She has not talked to her boyfriend since she was admitted. Patient complained of diffuse abdominal pain yesterday. She stated she believes it was because she took metformin regular and not the long acting form. However her home medications reflect she takes regular metformin twice daily. It is highly likely she has not been taking any metformin at home and this is the cause of her stomach upset in the hospital. She has no complaints of abdominal pain today and did take her Metformin this morning at 0825. She did not take any PRN Zofran or Imodium. Patient stated yesterday that she would not take any medications for her mental health, however she did take Cymbalta this morning and Seroquel last night. She did receive Trazodone PRN at 0012 and Zyprexa  5 mg this morning at 0827 for agitation. She did take Vistaril 25 mg PO on 7/23 at 1147. Patient was able to contract for safety today. Patient at time of evaluation was calm and cooperative. CBG this morning was 133. Repeat CMP shows improved AST from 45 to 35, and ALT from 51 to 49. Lipid panel is within normal limits. T4 0.94. She has no physical concerns at this time.   Principal Problem: MDD (major depressive disorder), recurrent, severe, with psychosis (HCC) Diagnosis: Principal Problem:   MDD (major depressive disorder), recurrent, severe, with psychosis (HCC) Active Problems:   Anxiety   Cannabis use disorder, moderate, dependence (HCC)   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 [facility])  Total Time spent with patient:  25 minutes  Past Psychiatric History: See H&P  Past Medical History:  Past Medical History:  Diagnosis Date   Allergic reaction    Anxiety    Asthma    Depression    DM (diabetes mellitus), gestational    Migraines    Rheumatoid arthritis (HCC) 2016   Diagnosed Dr.  Patsi Sears; has been treated with MTX, Humira, etc, but made her sick.  Was taking CBD oil and stopped as tested positive in a drug screen for work.    Past Surgical History:  Procedure Laterality Date   ABDOMINAL HYSTERECTOMY  2017   Fibroids; Both ovaries intact still   BACK SURGERY     CESAREAN SECTION  2009   CHOLECYSTECTOMY  2010   laparoscopic   TONSILLECTOMY     Family History:  Family History  Problem Relation Age of Onset   Arthritis Mother        Rheumatoid   Depression Daughter        and anxiety   Allergies Son    Family Psychiatric  History: See H&P Social History:  Social History   Substance and Sexual Activity  Alcohol Use Not Currently     Social History   Substance and Sexual Activity  Drug Use Yes   Types: Benzodiazepines, Marijuana    Social History   Socioeconomic History   Marital status: Media planner    Spouse name: Not on file   Number of  children: 5   Years of education: Not on file   Highest education level: Associate degree: academic program  Occupational History   Not on file  Tobacco Use   Smoking status: Never   Smokeless tobacco: Never  Vaping Use   Vaping Use: Former   Substances: CBD  Substance and Sexual Activity   Alcohol use: Not Currently   Drug use: Yes    Types: Benzodiazepines, Marijuana   Sexual activity: Not Currently    Birth control/protection: Surgical  Other Topics Concern   Not on file  Social History Narrative   Lives with current boyfriend   See history of present illness for more history 04/22/2020   Social Determinants of Health   Financial Resource Strain: Not on file  Food Insecurity: Not on file  Transportation Needs: Not on file  Physical Activity: Not on file  Stress: Not on file  Social Connections: Not on file   Additional Social History:    Sleep: Good  Appetite:  Good  Current Medications: Current Facility-Administered Medications  Medication Dose Route Frequency Provider Last Rate Last Admin   acetaminophen (TYLENOL) tablet 650 mg  650 mg Oral Q6H PRN Bobbitt, Shalon E, NP   650 mg at 05/31/21 0642   albuterol (VENTOLIN HFA) 108 (90 Base) MCG/ACT inhaler 1-2 puff  1-2 puff Inhalation Q4H PRN Bobbitt, Shalon E, NP       alum & mag hydroxide-simeth (MAALOX/MYLANTA) 200-200-20 MG/5ML suspension 30 mL  30 mL Oral Q4H PRN Bobbitt, Shalon E, NP   30 mL at 05/31/21 1607   DULoxetine (CYMBALTA) DR capsule 30 mg  30 mg Oral Daily Laveda Abbe, NP   30 mg at 06/01/21 0826   hydrOXYzine (ATARAX/VISTARIL) tablet 25 mg  25 mg Oral TID PRN Bobbitt, Shalon E, NP   25 mg at 05/31/21 1147   levothyroxine (SYNTHROID) tablet 50 mcg  50 mcg Oral Q0600 Bobbitt, Shalon E, NP   50 mcg at 06/01/21 4259   loperamide (IMODIUM) capsule 2-4 mg  2-4 mg Oral PRN Comer Locket, MD       magnesium hydroxide (MILK OF MAGNESIA) suspension 30 mL  30 mL Oral Daily PRN Bobbitt, Shalon E, NP        metFORMIN (GLUCOPHAGE) tablet 1,000 mg  1,000 mg Oral BID WC Bobbitt, Shalon E, NP   1,000 mg at 06/01/21 0825   OLANZapine zydis (ZYPREXA) disintegrating tablet 5 mg  5 mg Oral Q8H PRN Bartholomew Crews E, MD   5 mg at 06/01/21 0827   And   ziprasidone (GEODON) injection 20 mg  20 mg Intramuscular PRN Mason Jim, Amy E, MD       ondansetron (ZOFRAN-ODT) disintegrating tablet 4 mg  4 mg Oral Q6H PRN Mason Jim, Amy E, MD       pantoprazole (PROTONIX) EC tablet 40 mg  40 mg Oral Daily  Laveda Abbe, NP   40 mg at 06/01/21 0825   QUEtiapine (SEROQUEL) tablet 50 mg  50 mg Oral QHS Laveda Abbe, NP   50 mg at 05/31/21 2103   traZODone (DESYREL) tablet 50 mg  50 mg Oral QHS PRN Bobbitt, Shalon E, NP   50 mg at 05/31/21 0012    Lab Results:  Results for orders placed or performed during the hospital encounter of 05/30/21 (from the past 48 hour(s))  Glucose, capillary     Status: Abnormal   Collection Time: 05/31/21  6:14 AM  Result Value Ref Range   Glucose-Capillary 149 (H) 70 - 99 mg/dL    Comment: Glucose reference range applies only to samples taken after fasting for at least 8 hours.  TSH     Status: Abnormal   Collection Time: 05/31/21  6:38 AM  Result Value Ref Range   TSH 4.651 (H) 0.350 - 4.500 uIU/mL    Comment: Performed by a 3rd Generation assay with a functional sensitivity of <=0.01 uIU/mL. Performed at [facility], [address], [place], [place] [address]   Lipid panel     Status: None   Collection Time: 05/31/21  6:38 PM  Result Value Ref Range   Cholesterol 165 0 - 200 mg/dL   Triglycerides 604 <540 mg/dL   HDL 45 >98 mg/dL   Total CHOL/HDL Ratio 3.7 RATIO   VLDL 28 0 - 40 mg/dL   LDL Cholesterol 92 0 - 99 mg/dL    Comment:        Total Cholesterol/HDL:CHD Risk Coronary Heart Disease Risk Table                     Men   Women  1/2 Average Risk   3.4   3.3  Average Risk       5.0   4.4  2 X Average Risk   9.6   7.1  3 X Average  Risk  23.4   11.0        Use the calculated Patient Ratio above and the CHD Risk Table to determine the patient's CHD Risk.        ATP III CLASSIFICATION (LDL):  <100     mg/dL   Optimal  119-147  mg/dL   Near or Above                    Optimal  130-159  mg/dL   Borderline  829-562  mg/dL   High  >130     mg/dL   Very High Performed at [facility], [address] [place], [place] [address]   Comprehensive metabolic panel     Status: Abnormal   Collection Time: 05/31/21  6:38 PM  Result Value Ref Range   Sodium 137 135 - 145 mmol/L   Potassium 3.5 3.5 - 5.1 mmol/L   Chloride 104 98 - 111 mmol/L   CO2 22 22 - 32 mmol/L   Glucose, Bld 199 (H) 70 - 99 mg/dL    Comment: Glucose reference range applies only to samples taken after fasting for at least 8 hours.   BUN 9 6 - 20 mg/dL   Creatinine, Ser 4.69 0.44 - 1.00 mg/dL   Calcium 9.1 8.9 - 62.9 mg/dL   Total Protein 7.7 6.5 - 8.1 g/dL   Albumin 4.2 3.5 - 5.0 g/dL   AST 35 15 - 41 U/L   ALT 49 (H) 0 -  44 U/L   Alkaline Phosphatase 74 38 - 126 U/L   Total Bilirubin 0.6 0.3 - 1.2 mg/dL   GFR, Estimated >81 >19 mL/min    Comment: (NOTE) Calculated using the CKD-EPI Creatinine Equation (2021)    Anion gap 11 5 - 15    Comment: Performed at [facility], [address], [place], [place] [address]  Hepatitis panel, acute     Status: None   Collection Time: 05/31/21  6:38 PM  Result Value Ref Range   Hepatitis B Surface Ag NON REACTIVE NON REACTIVE   HCV Ab NON REACTIVE NON REACTIVE    Comment: (NOTE) Nonreactive HCV antibody screen is consistent with no HCV infections,  unless recent infection is suspected or other evidence exists to indicate HCV infection.     Hep A IgM NON REACTIVE NON REACTIVE   Hep B C IgM NON REACTIVE NON REACTIVE    Comment: Performed at [facility], [address] [place], [place] [address]  T4, free     Status: None   Collection Time: 05/31/21  6:38 PM   Result Value Ref Range   Free T4 0.94 0.61 - 1.12 ng/dL    Comment: (NOTE) Biotin ingestion may interfere with free T4 tests. If the results are inconsistent with the TSH level, previous test results, or the clinical presentation, then consider biotin interference. If needed, order repeat testing after stopping biotin. Performed at [facility], [address], [place], [place] [address]   Glucose, capillary     Status: Abnormal   Collection Time: 06/01/21  5:46 AM  Result Value Ref Range   Glucose-Capillary 133 (H) 70 - 99 mg/dL    Comment: Glucose reference range applies only to samples taken after fasting for at least 8 hours.   Comment 1 Notify RN     Blood Alcohol level:  Lab Results  Component Value Date   ETH <10 05/29/2021   ETH <10 01/11/2021    Metabolic Disorder Labs: Lab Results  Component Value Date   HGBA1C 10.4 (H) 01/13/2021   MPG 251.78 01/13/2021   No results found for: PROLACTIN Lab Results  Component Value Date   CHOL 165 05/31/2021   TRIG 138 05/31/2021   HDL 45 05/31/2021   CHOLHDL 3.7 05/31/2021   VLDL 28 05/31/2021   LDLCALC 92 05/31/2021   LDLCALC 137 (H) 01/13/2021    Physical Findings: AIMS:  , ,  ,  ,    CIWA:    COWS:     Musculoskeletal: Strength & Muscle Tone: within normal limits Gait & Station: normal Patient leans: N/A  Psychiatric Specialty Exam:  Presentation  General Appearance: Appropriate for Environment; Casual  Eye Contact:Fair  Speech:Clear and Coherent  Speech Volume:Decreased  Handedness:Right  Mood and Affect  Mood:Depressed  Affect:Congruent  Thought Process  Thought Processes:Coherent  Descriptions of Associations:Intact  Orientation:Full (Time, Place and Person)  Thought Content:Logical  History of Schizophrenia/Schizoaffective disorder:No  Duration of Psychotic Symptoms:Less than six months  Hallucinations:Hallucinations: Auditory; Visual (Hears faint echos and see black  dots) Description of Auditory Hallucinations: hears faint echos, improved since admission Description of Visual Hallucinations: Sees black spots Ideas of Reference:None  Suicidal Thoughts:Suicidal Thoughts: No Homicidal Thoughts:Homicidal Thoughts: No  Sensorium  Memory:Immediate Fair; Recent Fair; Remote Fair  Judgment:Fair  Insight:Fair  Executive Functions  Concentration:Good  Attention Span:Good  Recall:Good  Fund of Knowledge:Good  Language:Good  Psychomotor Activity  Psychomotor Activity: Psychomotor Activity: Normal  Assets  Assets:Communication  Skills; Desire for Improvement; Physical Health; Social Support; Resilience  Sleep  Sleep: Sleep: Good Number of Hours of Sleep: 7.75  Physical Exam: Physical Exam Vitals and nursing note reviewed.  HENT:     Head: Normocephalic.  Pulmonary:     Effort: Pulmonary effort is normal.  Musculoskeletal:        General: Normal range of motion.     Cervical back: Normal range of motion.  Neurological:     General: No focal deficit present.     Mental Status: She is alert and oriented to person, place, and time.  Psychiatric:        Attention and Perception: She is inattentive.        Mood and Affect: Mood is anxious and depressed. Affect is labile.        Speech: Speech is rapid and pressured.        Behavior: Behavior is agitated.        Thought Content: Thought content is paranoid and delusional.   Review of Systems  Constitutional:  Negative for fever.  HENT:  Negative for congestion, sinus pain and sore throat.   Respiratory:  Negative for cough and shortness of breath.   Cardiovascular:  Negative for chest pain.  Genitourinary: Negative.   Musculoskeletal: Negative.   Neurological: Negative.   Blood pressure 107/71, pulse (!) 113, temperature 98 F (36.7 C), temperature source Oral, resp. rate 18, height 5\' 4"  (1.626 m), weight 98.9 kg, SpO2 99 %. Body mass index is 37.42 kg/m.   Treatment Plan  Summary: Daily contact with patient to assess and evaluate symptoms and progress in treatment and Medication management  MDD recurrent, current episode severe: Continue Cymbalta 30 mg PO daily Continue Seroquel 50 mg PO QHS   Insomnia: Schedule Trazodone 100 mg PO at bedtime   Anxiety: Continue Vistaril 25 mg PO TID PRN   GERD: Continue Protonix 40 mg PO daily   Nausea: Continue  Zofran 4 mg PO every 8 hours as needed   Diabetes mellitus Type II: Continue Metformin 500 mg PO BID   Continue IVC status Continue every 15 minute safety checks Encourage participation in the therapeutic milieu Discharge planning in progress.    , NP 06/01/2021, 5:15 PM

## 2021-06-01 NOTE — Progress Notes (Signed)
Patient was allowed to shave.Staff was present and razor was returned and disposed in the sharp container.

## 2021-06-01 NOTE — B[facility] Counselor (Signed)
CSW attempted to complete PSA with pt but pt declined to participate.   Fredirick Lathe, LCSWA Clinicial Social Worker [employer]

## 2021-06-01 NOTE — [facility] Group Notes (Signed)
[facility] Group Notes: (Clinical Social Work)   06/01/2021      Type of Therapy:  Group Therapy   Participation Level:  Did Not Attend - was invited both individually by MHT and by overhead announcement, chose not to attend.   Serayah Yazdani Grossman-Orr, [name] 06/01/2021, 12:11 PM    

## 2021-06-01 NOTE — Progress Notes (Signed)
Patient c/o feeling very anxious PRN vistaril offered Patient refused vistaril stating she did not feel well when she took it the last time and does not feel well with Zyprexa. Patient started that Ativan is the only prn she has taken and will not make her abd, hurt. Patient educated on all the medications.  Support and encouragement provided. Denies abdominal pain at present. Stated she slept well except that she thinks Seroquel gave her bad dreams last night. Denies SI/HI/A/VH support and encouragement provided as needed.

## 2021-06-01 NOTE — Progress Notes (Signed)
Patient refused Metformin afternoon dose. Patient stated that she requested extended release to be ordered. Staff will continue to encourage her to be compliant with treatment.

## 2021-06-01 NOTE — Progress Notes (Signed)
Adult Psychoeducational Group Note  Date:  06/01/2021 Time:  9:06 PM  Group Topic/Focus: Wrap-Up Group:   The focus of this group is to help patients review their daily goal of treatment and discuss progress on daily workbooks.   Participation Level: Did Not Attend   Participation Quality: Did Not Attend   Affect: Did Not Attend   Cognitive: Did Not Attend   Insight: None   Engagement in Group: Did Not Attend   Modes of Intervention: Did Not Attend   Additional Comments: Pt did not attend evening wrap up group tonight.    Nicoletta Dress 06/01/2021, 9:06 PM

## 2021-06-01 NOTE — [facility] Group Notes (Signed)
[facility] Group Notes:  (Nursing/MHT/Case Management/Adjunct)  Date:  06/01/2021  Time:  10:34 AM  Type of Therapy:  Group Therapy  Participation Level:  Active  Participation Quality:  Appropriate  Affect:  Appropriate  Cognitive:  Appropriate  Insight:  Appropriate  Engagement in Group:  Engaged  Modes of Intervention:  Orientation  Summary of Progress/Problems:Pt goal for today is to find new ways to cope with anger.   Darian Ace J Mashayla Lavin 06/01/2021, 10:34 AM
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
## 2021-06-02 LAB — GLUCOSE, CAPILLARY
Glucose-Capillary: 146 mg/dL — ABNORMAL HIGH (ref 70–99)
Glucose-Capillary: 184 mg/dL — ABNORMAL HIGH (ref 70–99)
Glucose-Capillary: 295 mg/dL — ABNORMAL HIGH (ref 70–99)

## 2021-06-02 LAB — HEMOGLOBIN A1C
Hgb A1c MFr Bld: 8.1 % — ABNORMAL HIGH (ref 4.8–5.6)
Mean Plasma Glucose: 186 mg/dL

## 2021-06-02 LAB — T3, FREE: T3, Free: 2.8 pg/mL (ref 2.0–4.4)

## 2021-06-02 MED ORDER — METFORMIN HCL ER 750 MG PO TB24
2000.0000 mg | ORAL_TABLET | Freq: Every day | ORAL | Status: DC
  Administered 2021-06-02 – 2021-06-04 (×3): 2000 mg via ORAL
  Filled 2021-06-02 (×4): qty 1

## 2021-06-02 MED ORDER — DIPHENHYDRAMINE HCL 25 MG PO CAPS
25.0000 mg | ORAL_CAPSULE | Freq: Once | ORAL | Status: AC
  Administered 2021-06-02: 25 mg via ORAL
  Filled 2021-06-02 (×2): qty 1

## 2021-06-02 NOTE — Progress Notes (Signed)
   06/02/21 1300  Psych Admission Type (Psych Patients Only)  Admission Status Involuntary  Psychosocial Assessment  Patient Complaints Anxiety;Agitation  Eye Contact Fair  Facial Expression Anxious;Sad  Affect Anxious;Irritable  Speech Logical/coherent  Interaction Assertive  Motor Activity Slow  Appearance/Hygiene Disheveled  Behavior Characteristics Cooperative;Anxious  Mood Anxious  Aggressive Behavior  Targets Other (Comment)  Effect No apparent injury  Thought Process  Coherency Circumstantial  Content Blaming others  Delusions Paranoid  Perception WDL  Hallucination None reported or observed  Judgment Impaired  Confusion Mild  Danger to Self  Current suicidal ideation? Denies  Danger to Others  Danger to Others None reported or observed  Danger to Others Abnormal  Harmful Behavior to others No threats or harm toward other people  Destructive Behavior No threats or harm toward property
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
## 2021-06-02 NOTE — Progress Notes (Addendum)
Pt asking for additional meds for sleep. Pt given PRN trazodone. Provider notified. Pt has melatonin on the PTA med list.   06/02/21 @ 2300 - Pt given order for one-time dose Benadryl 25 mg.
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
## 2021-06-02 NOTE — [facility] Group Notes (Signed)
[facility] [name] Group Therapy  06/02/2021 2:36 PM  Type of Therapy:  Group Therapy: Stress Exploration   Participation Level:  Did Not Attend  Summary of Progress/Problems: Did not attend  Salina Stanfield E Cattleya Dobratz 06/02/2021, 2:36 PM

## 2021-06-02 NOTE — [facility] Suicide Risk Assessment (Signed)
[facility] INPATIENT:  Family/Significant Other Suicide Prevention Education  Suicide Prevention Education:  Patient Refusal for Family/Significant Other Suicide Prevention Education: The patient Erika Rhodes has refused to provide written consent for family/significant other to be provided Family/Significant Other Suicide Prevention Education during admission and/or prior to discharge.  Physician notified.  Jerrald Doverspike A Raechal Raben 06/02/2021, 11:47 AM

## 2021-06-02 NOTE — Tx Team (Signed)
Interdisciplinary Treatment and Diagnostic Plan Update  06/02/2021 Time of Session: 1:25pm Atoya Andrew MRN: [id number]  Principal Diagnosis: MDD (major depressive disorder), recurrent, severe, with psychosis ([place])  Secondary Diagnoses: Principal Problem:   MDD (major depressive disorder), recurrent, severe, with psychosis ([place]) Active Problems:   Anxiety   Cannabis use disorder, moderate, dependence ([place])   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 [facility])   Current Medications:  Current Facility-Administered Medications  Medication Dose Route Frequency Provider Last Rate Last Admin   acetaminophen (TYLENOL) tablet 650 mg  650 mg Oral Q6H PRN Bobbitt, Shalon E, NP   650 mg at 05/31/21 0642   albuterol (VENTOLIN HFA) 108 (90 Base) MCG/ACT inhaler 1-2 puff  1-2 puff Inhalation Q4H PRN Bobbitt, Shalon E, NP       alum & mag hydroxide-simeth (MAALOX/MYLANTA) 200-200-20 MG/5ML suspension 30 mL  30 mL Oral Q4H PRN Bobbitt, Shalon E, NP   30 mL at 05/31/21 1607   DULoxetine (CYMBALTA) DR capsule 30 mg  30 mg Oral Daily Ethelene Hal, NP   30 mg at 06/01/21 1601   hydrOXYzine (ATARAX/VISTARIL) tablet 25 mg  25 mg Oral TID PRN Bobbitt, Hessie Diener E, NP   25 mg at 05/31/21 1147   levothyroxine (SYNTHROID) tablet 50 mcg  50 mcg Oral Q0600 Bobbitt, Shalon E, NP   50 mcg at 06/02/21 0558   loperamide (IMODIUM) capsule 2-4 mg  2-4 mg Oral PRN Harlow Asa, MD       magnesium hydroxide (MILK OF MAGNESIA) suspension 30 mL  30 mL Oral Daily PRN Bobbitt, Shalon E, NP       metFORMIN (GLUCOPHAGE-XR) 24 hr tablet 2,000 mg  2,000 mg Oral Q supper Ethelene Hal, NP       OLANZapine zydis (ZYPREXA) disintegrating tablet 5 mg  5 mg Oral Q8H PRN Nelda Marseille, Amy E, MD   5 mg at 06/02/21 0932   And   ziprasidone (GEODON) injection 20 mg  20 mg Intramuscular PRN Harlow Asa, MD       ondansetron (ZOFRAN-ODT) disintegrating tablet 4 mg  4 mg Oral Q6H PRN Nelda Marseille, Amy E, MD       pantoprazole  (PROTONIX) EC tablet 40 mg  40 mg Oral Daily Ethelene Hal, NP   40 mg at 06/02/21 3557   QUEtiapine (SEROQUEL) tablet 50 mg  50 mg Oral QHS Ethelene Hal, NP   50 mg at 06/01/21 2046   traZODone (DESYREL) tablet 50 mg  50 mg Oral QHS PRN Bobbitt, Shalon E, NP   50 mg at 06/01/21 2046   PTA Medications: Medications Prior to Admission  Medication Sig Dispense Refill Last Dose   albuterol (VENTOLIN HFA) 108 (90 Base) MCG/ACT inhaler Inhale 1-2 puffs into the lungs every 4 (four) hours as needed for wheezing or shortness of breath. 1 each 0    clindamycin-benzoyl peroxide (BENZACLIN) gel Apply 1 application topically 2 (two) times daily.      diphenhydrAMINE (BENADRYL) 25 MG tablet Take 25 mg by mouth every 6 (six) hours as needed for allergies.      doxycycline (VIBRAMYCIN) 100 MG capsule Take 100 mg by mouth 2 (two) times daily. 7 day supply      DULoxetine (CYMBALTA) 30 MG capsule Take 1 capsule (30 mg total) by mouth daily. (Patient not taking: Reported on 05/30/2021) 30 capsule 0    levothyroxine (SYNTHROID) 50 MCG tablet Take 1 tablet (50 mcg total) by mouth daily at 6 (six) AM. 30  tablet 0    MELATONIN PO Take 2 tablets by mouth at bedtime as needed (sleep).      metFORMIN (GLUCOPHAGE) 1000 MG tablet Take 1,000 mg by mouth 2 (two) times daily.      metFORMIN (GLUCOPHAGE) 500 MG tablet Take 1 tablet (500 mg total) by mouth 2 (two) times daily with a meal. (Patient not taking: Reported on 05/30/2021) 60 tablet 0    naproxen sodium (ALEVE) 220 MG tablet Take 440 mg by mouth 2 (two) times daily as needed (pain/headache/pain).      oxymetazoline (NASAL SPRAY 12 HOUR) 0.05 % nasal spray Place 1 spray into both nostrils 2 (two) times daily as needed for congestion.      SUMAtriptan (IMITREX) 50 MG tablet Take 1 tablet (50 mg total) by mouth every 2 (two) hours as needed for migraine or headache. May repeat in 2 hours if headache persists or recurs. (Patient not taking: Reported on  05/30/2021) 10 tablet 0     Patient Stressors: Financial difficulties Legal issue Marital or family conflict Medication change or noncompliance  Patient Strengths: Technical sales engineer for treatment/growth  Treatment Modalities: Medication Management, Group therapy, Case management,  1 to 1 session with clinician, Psychoeducation, Recreational therapy.   Physician Treatment Plan for Primary Diagnosis: MDD (major depressive disorder), recurrent, severe, with psychosis ([place]) Long Term Goal(s): Improvement in symptoms so as ready for discharge   Short Term Goals: Ability to identify changes in lifestyle to reduce recurrence of condition will improve Ability to verbalize feelings will improve Ability to disclose and discuss suicidal ideas Ability to demonstrate self-control will improve Ability to identify and develop effective coping behaviors will improve Compliance with prescribed medications will improve Ability to identify triggers associated with substance abuse/mental health issues will improve  Medication Management: Evaluate patient's response, side effects, and tolerance of medication regimen.  Therapeutic Interventions: 1 to 1 sessions, Unit Group sessions and Medication administration.  Evaluation of Outcomes: Not Met  Physician Treatment Plan for Secondary Diagnosis: Principal Problem:   MDD (major depressive disorder), recurrent, severe, with psychosis ([place]) Active Problems:   Anxiety   Cannabis use disorder, moderate, dependence ([place])   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 [facility])  Long Term Goal(s): Improvement in symptoms so as ready for discharge   Short Term Goals: Ability to identify changes in lifestyle to reduce recurrence of condition will improve Ability to verbalize feelings will improve Ability to disclose and discuss suicidal ideas Ability to demonstrate self-control will improve Ability to identify and develop effective coping  behaviors will improve Compliance with prescribed medications will improve Ability to identify triggers associated with substance abuse/mental health issues will improve     Medication Management: Evaluate patient's response, side effects, and tolerance of medication regimen.  Therapeutic Interventions: 1 to 1 sessions, Unit Group sessions and Medication administration.  Evaluation of Outcomes: Not Met   RN Treatment Plan for Primary Diagnosis: MDD (major depressive disorder), recurrent, severe, with psychosis ([place]) Long Term Goal(s): Knowledge of disease and therapeutic regimen to maintain health will improve  Short Term Goals: Ability to remain free from injury will improve, Ability to verbalize frustration and anger appropriately will improve, Ability to demonstrate self-control, Ability to identify and develop effective coping behaviors will improve, and Compliance with prescribed medications will improve  Medication Management: RN will administer medications as ordered by provider, will assess and evaluate patient's response and provide education to patient for prescribed medication. RN will report any adverse and/or side effects  to prescribing provider.  Therapeutic Interventions: 1 on 1 counseling sessions, Psychoeducation, Medication administration, Evaluate responses to treatment, Monitor vital signs and CBGs as ordered, Perform/monitor CIWA, COWS, AIMS and Fall Risk screenings as ordered, Perform wound care treatments as ordered.  Evaluation of Outcomes: Not Met   [name] Treatment Plan for Primary Diagnosis: MDD (major depressive disorder), recurrent, severe, with psychosis ([place]) Long Term Goal(s): Safe transition to appropriate next level of care at discharge, Engage patient in therapeutic group addressing interpersonal concerns.  Short Term Goals: Engage patient in aftercare planning with referrals and resources, Increase social support, Increase ability to appropriately verbalize  feelings, Increase emotional regulation, and Increase skills for wellness and recovery  Therapeutic Interventions: Assess for all discharge needs, 1 to 1 time with Social worker, Explore available resources and support systems, Assess for adequacy in community support network, Educate family and significant other(s) on suicide prevention, Complete Psychosocial Assessment, Interpersonal group therapy.  Evaluation of Outcomes: Not Met   Progress in Treatment: Attending groups: No. Participating in groups: No. Taking medication as prescribed: Yes. Toleration medication: Yes. Family/Significant other contact made: No, will contact:  declined consents Patient understands diagnosis: No. Discussing patient identified problems/goals with staff: Yes. Medical problems stabilized or resolved: Yes. Denies suicidal/homicidal ideation: Yes. Issues/concerns per patient self-inventory: No.   New problem(s) identified: No, Describe:  none  New Short Term/Long Term Goal(s): medication stabilization, elimination of SI thoughts, development of comprehensive mental wellness plan.   Patient Goals:  "I have none"  Discharge Plan or Barriers: Patient plans to return to stay with ex-boyfriend. Pt is currently declining all follow up.   Reason for Continuation of Hospitalization: Anxiety Mania Medication stabilization  Estimated Length of Stay: 3-5 days  Attendees: Patient: Allison Deshotels 06/02/2021   Physician: Fatima Sanger, DO 06/02/2021   Nursing:  06/02/2021   RN Care Manager: 06/02/2021   Social Worker: Darletta Moll, [name] 06/02/2021   Recreational Therapist:  06/02/2021  Other:  06/02/2021   Other:  06/02/2021   Other: 06/02/2021     Scribe for Treatment Team: Vassie Moselle, [name] 06/02/2021 2:11 PM

## 2021-06-02 NOTE — Progress Notes (Signed)
Pt did not attend orientation/goals group. 

## 2021-06-02 NOTE — Progress Notes (Signed)
Recreation Therapy Notes  Date: 7.25.22 Time: 1010 Location: [address] Dayroom  Group Topic: Coping Skills  Goal Area(s) Addresses:  Patient will identify positive coping skills. Patient will identify benefit of using positive coping skills post d/c.  Intervention: [employer], Pencils, Airline pilot, Marker  Activity: Mind Map.  LRT and patients filled the first 8 boxes of the mind map (anger, frustration, depression, self esteem, patience, love/caring, tolerance and finances) with items that would need coping skills to be dealt with appropriately. Individually, patients were then given 20 minutes to come up with at least three coping skills for each area identified. The group would then come back together and write the answers on the board.  If anyone had any blank spots on their sheet, they could fill them in with the coping skills that had been identified by peers.  Education: Pharmacologist, Building control surveyor.   Education Outcome: Acknowledges understanding/In group clarification offered/Needs additional education.   Clinical Observations/Feedback: Pt did not attend group session.    Caroll Rancher, LRT/CTRS        Caroll Rancher A 06/02/2021 12:53 PM

## 2021-06-02 NOTE — Progress Notes (Addendum)
[facility] MD Progress Note  06/02/2021 1:36 PM Erika Rhodes  MRN:  [id number] Subjective:  "I have good news for you, I slept good last night."   Objective: Erika Rhodes is a 40 year old female who presented to [facility], under IVC, after acting erratically at home. According to IVC paperwork taken out by her boyfriend, Velvet Bathe [phone], she has not been taking her medications, breaking dishes and believes her cats are spying on her and reporting to the government. Patient has a past psychiatric history significant for PTSD, MDD, Borderline personality traits and cannabis use disorder. She has has previous suicide attempts by overdose and cutting, her last attempt was several months ago by taking pills. She was inpatient at [facility] in March 2022. Her UDS was positive for THC and benzodiazepine (hospital given).  Evaluation on the unit: Patient was seen and evaluated, chart reviewed. Patient is in her room, asleep this morning. Patient stated she slept well last night, record shows she slept 9.25 hours last night. Patient stated she ate breakfast this morning and her appetite is good. She denies SI/HI/AVH. She denies any mention of paranoid or delusional thoughts. She has not talked to her boyfriend since she was admitted. She did state she will return there when she is discharged but they are not really "together" anymore. She stated "he broke up with me a long time ago and I stay in my little room." She stated "All of my things are there and I should not have to give up my things and my vehicle because he is an asshole." Patient takes no responsibility or ownership of the behavior or lack of treatment compliance that continues to put her in the hospital. Patient feels her situation is caused by everyone else and she is not the problem. She stated she does not need medication or therapy. Patient is refusing her Metformin and stated it causes her  abdominal pain. She is requesting to be put on Metformin XL  instead. Her CBG today at 1119 is 184. Will start Metformin XL 1,000 mg daily with supper and monitor CBG for dose titration. Patient is taking her medications intermittently.  that she would not take any medications for her mental health, however she did take Cymbalta this morning and Seroquel last night. She has Trazodone PRN last night. and Zyprexa 5 mg this morning at 0558 for agitation. Patient asked the  RN around 10 AM today if she could have a Geodon IM for agitation. She is not acting out or being disruptive. When question where her agitation is coming from she stated "from being here." Patient was able to contract for safety today. Patient at time of evaluation was calm and cooperative. She stated she will not be going to group and just wants to be alone and sleep while she is here. She has no physical concerns at this time.   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  Patsi Sears; has been treated with MTX, Humira, etc, but made her sick.  Was taking CBD oil and stopped as tested positive in a drug screen for work.    Past Surgical History:  Procedure Laterality Date   ABDOMINAL HYSTERECTOMY  2017   Fibroids; Both ovaries intact still   BACK SURGERY     CESAREAN SECTION  2009   CHOLECYSTECTOMY  2010   laparoscopic   TONSILLECTOMY     Family History:  Family History  Problem Relation Age of Onset    Arthritis Mother        Rheumatoid   Depression Daughter        and anxiety   Allergies Son    Family Psychiatric  History: See H&P Social History:  Social History   Substance and Sexual Activity  Alcohol Use Not Currently     Social History   Substance and Sexual Activity  Drug Use Yes   Types: Benzodiazepines, Marijuana    Social History   Socioeconomic History   Marital status: Media planner    Spouse name: Not on file   Number of children: 5   Years of education: Not on file   Highest education level: Associate degree: academic program  Occupational History   Not on file  Tobacco Use   Smoking status: Never   Smokeless tobacco: Never  Vaping Use   Vaping Use: Former   Substances: CBD  Substance and Sexual Activity   Alcohol use: Not Currently   Drug use: Yes    Types: Benzodiazepines, Marijuana   Sexual activity: Not Currently    Birth control/protection: Surgical  Other Topics Concern   Not on file  Social History Narrative   Lives with current boyfriend   See history of present illness for more history 04/22/2020   Social Determinants of Health   Financial Resource Strain: Not on file  Food Insecurity: Not on file  Transportation Needs: Not on file  Physical Activity: Not on file  Stress: Not on file  Social Connections: Not on file   Additional Social History:    Sleep: Good  Appetite:  Good  Current Medications: Current Facility-Administered Medications  Medication Dose Route Frequency Provider Last Rate Last Admin   acetaminophen (TYLENOL) tablet 650 mg  650 mg Oral Q6H PRN Bobbitt, Shalon E, NP   650 mg at 05/31/21 0642   albuterol (VENTOLIN HFA) 108 (90 Base) MCG/ACT inhaler 1-2 puff  1-2 puff Inhalation Q4H PRN Bobbitt, Shalon E, NP       alum & mag hydroxide-simeth (MAALOX/MYLANTA) 200-200-20 MG/5ML suspension 30 mL  30 mL Oral Q4H PRN Bobbitt, Shalon E, NP   30 mL at 05/31/21 1607   DULoxetine (CYMBALTA) DR capsule 30 mg  30 mg  Oral Daily Laveda Abbe, NP   30 mg at 06/01/21 8527   hydrOXYzine (ATARAX/VISTARIL) tablet 25 mg  25 mg Oral TID PRN Bobbitt, Shalon E, NP   25 mg at 05/31/21 1147   levothyroxine (SYNTHROID) tablet 50 mcg  50 mcg Oral Q0600 Bobbitt, Shalon E, NP   50 mcg at 06/02/21 0558   loperamide (IMODIUM) capsule 2-4 mg  2-4 mg Oral PRN Comer Locket, MD       magnesium hydroxide (MILK OF MAGNESIA) suspension 30 mL  30 mL Oral Daily PRN Bobbitt, Shalon E, NP       metFORMIN (GLUCOPHAGE) tablet 1,000 mg  1,000 mg Oral BID WC Bobbitt, Franchot Mimes, NP  1,000 mg at 06/01/21 0825   OLANZapine zydis (ZYPREXA) disintegrating tablet 5 mg  5 mg Oral Q8H PRN Comer Locket, MD   5 mg at 06/02/21 8119   And   ziprasidone (GEODON) injection 20 mg  20 mg Intramuscular PRN Comer Locket, MD       ondansetron (ZOFRAN-ODT) disintegrating tablet 4 mg  4 mg Oral Q6H PRN Comer Locket, MD       pantoprazole (PROTONIX) EC tablet 40 mg  40 mg Oral Daily Laveda Abbe, NP   40 mg at 06/02/21 1478   QUEtiapine (SEROQUEL) tablet 50 mg  50 mg Oral QHS Laveda Abbe, NP   50 mg at 06/01/21 2046   traZODone (DESYREL) tablet 50 mg  50 mg Oral QHS PRN Bobbitt, Franchot Mimes, NP   50 mg at 06/01/21 2046    Lab Results:  Results for orders placed or performed during the hospital encounter of 05/30/21 (from the past 48 hour(s))  Lipid panel     Status: None   Collection Time: 05/31/21  6:38 PM  Result Value Ref Range   Cholesterol 165 0 - 200 mg/dL   Triglycerides 295 <621 mg/dL   HDL 45 >30 mg/dL   Total CHOL/HDL Ratio 3.7 RATIO   VLDL 28 0 - 40 mg/dL   LDL Cholesterol 92 0 - 99 mg/dL    Comment:        Total Cholesterol/HDL:CHD Risk Coronary Heart Disease Risk Table                     Men   Women  1/2 Average Risk   3.4   3.3  Average Risk       5.0   4.4  2 X Average Risk   9.6   7.1  3 X Average Risk  23.4   11.0        Use the calculated Patient Ratio above and the CHD Risk Table to  determine the patient's CHD Risk.        ATP III CLASSIFICATION (LDL):  <100     mg/dL   Optimal  865-784  mg/dL   Near or Above                    Optimal  130-159  mg/dL   Borderline  696-295  mg/dL   High  >284     mg/dL   Very High Performed at [facility], [address], [place], [place] [address]   Comprehensive metabolic panel     Status: Abnormal   Collection Time: 05/31/21  6:38 PM  Result Value Ref Range   Sodium 137 135 - 145 mmol/L   Potassium 3.5 3.5 - 5.1 mmol/L   Chloride 104 98 - 111 mmol/L   CO2 22 22 - 32 mmol/L   Glucose, Bld 199 (H) 70 - 99 mg/dL    Comment: Glucose reference range applies only to samples taken after fasting for at least 8 hours.   BUN 9 6 - 20 mg/dL   Creatinine, Ser 0.10 0.44 - 1.00 mg/dL   Calcium 9.1 8.9 - 27.2 mg/dL   Total Protein 7.7 6.5 - 8.1 g/dL   Albumin 4.2 3.5 - 5.0 g/dL   AST 35 15 - 41 U/L   ALT 49 (H) 0 - 44 U/L   Alkaline Phosphatase 74 38 - 126 U/L   Total Bilirubin 0.6 0.3 - 1.2 mg/dL  GFR, Estimated >60 >60 mL/min    Comment: (NOTE) Calculated using the CKD-EPI Creatinine Equation (2021)    Anion gap 11 5 - 15    Comment: Performed at [facility], [address], [place], [place] [address]  Hepatitis panel, acute     Status: None   Collection Time: 05/31/21  6:38 PM  Result Value Ref Range   Hepatitis B Surface Ag NON REACTIVE NON REACTIVE   HCV Ab NON REACTIVE NON REACTIVE    Comment: (NOTE) Nonreactive HCV antibody screen is consistent with no HCV infections,  unless recent infection is suspected or other evidence exists to indicate HCV infection.     Hep A IgM NON REACTIVE NON REACTIVE   Hep B C IgM NON REACTIVE NON REACTIVE    Comment: Performed at [facility], [address], [place], [place] [address]  T4, free     Status: None   Collection Time: 05/31/21  6:38 PM  Result Value Ref Range   Free T4 0.94 0.61 - 1.12 ng/dL    Comment: (NOTE) Biotin  ingestion may interfere with free T4 tests. If the results are inconsistent with the TSH level, previous test results, or the clinical presentation, then consider biotin interference. If needed, order repeat testing after stopping biotin. Performed at [facility], [address] [place], [place] [address]   T3, free     Status: None   Collection Time: 05/31/21  6:38 PM  Result Value Ref Range   T3, Free 2.8 2.0 - 4.4 pg/mL    Comment: (NOTE) Performed At: [facility] [address] [place], [place] [id number] Jolene Schimke MD FV:[id number]   Glucose, capillary     Status: Abnormal   Collection Time: 06/01/21  5:46 AM  Result Value Ref Range   Glucose-Capillary 133 (H) 70 - 99 mg/dL    Comment: Glucose reference range applies only to samples taken after fasting for at least 8 hours.   Comment 1 Notify RN   Glucose, capillary     Status: Abnormal   Collection Time: 06/02/21  5:50 AM  Result Value Ref Range   Glucose-Capillary 146 (H) 70 - 99 mg/dL    Comment: Glucose reference range applies only to samples taken after fasting for at least 8 hours.  Glucose, capillary     Status: Abnormal   Collection Time: 06/02/21 11:19 AM  Result Value Ref Range   Glucose-Capillary 184 (H) 70 - 99 mg/dL    Comment: Glucose reference range applies only to samples taken after fasting for at least 8 hours.    Blood Alcohol level:  Lab Results  Component Value Date   ETH <10 05/29/2021   ETH <10 01/11/2021    Metabolic Disorder Labs: Lab Results  Component Value Date   HGBA1C 8.1 (H) 05/31/2021   MPG 186 05/31/2021   MPG 251.78 01/13/2021   No results found for: PROLACTIN Lab Results  Component Value Date   CHOL 165 05/31/2021   TRIG 138 05/31/2021   HDL 45 05/31/2021   CHOLHDL 3.7 05/31/2021   VLDL 28 05/31/2021   LDLCALC 92 05/31/2021   LDLCALC 137 (H) 01/13/2021    Physical Findings: AIMS:  , ,  ,  ,    CIWA:    COWS:     Musculoskeletal: Strength &  Muscle Tone: within normal limits Gait & Station: normal Patient leans: N/A  Psychiatric Specialty Exam:  Presentation  General Appearance: Appropriate for Environment; Disheveled  Eye Contact:Fair  Speech:Clear and Coherent; Normal Rate  Speech Volume:Normal  Handedness:Right  Mood and Affect  Mood:Depressed  Affect:Congruent  Thought Process  Thought Processes:Coherent  Descriptions of Associations:Intact  Orientation:Full (Time, Place and Person)  Thought Content:Illogical  History of Schizophrenia/Schizoaffective disorder:No  Duration of Psychotic Symptoms:Less than six months  Hallucinations:Hallucinations: -- (Hears faint echos and see black dots) Ideas of Reference:None  Suicidal Thoughts:Suicidal Thoughts: No Homicidal Thoughts:Homicidal Thoughts: No  Sensorium  Memory:Immediate Fair; Recent Fair; Remote Fair  Judgment:Fair  Insight:Poor  Executive Functions  Concentration:Good  Attention Span:Good  Recall:Good  Fund of Knowledge:Good  Language:Good  Psychomotor Activity  Psychomotor Activity: Psychomotor Activity: Normal  Assets  Assets:Communication Skills; Desire for Improvement; Physical Health; Social Support; Resilience; Housing; Transportation  Sleep  Sleep: Sleep: Good Number of Hours of Sleep: 9.25  Physical Exam: Physical Exam Vitals and nursing note reviewed.  HENT:     Head: Normocephalic.  Pulmonary:     Effort: Pulmonary effort is normal.  Musculoskeletal:        General: Normal range of motion.     Cervical back: Normal range of motion.  Neurological:     General: No focal deficit present.     Mental Status: She is alert and oriented to person, place, and time.  Psychiatric:        Attention and Perception: She is inattentive.        Mood and Affect: Mood is anxious and depressed. Affect is labile.        Speech: Speech is rapid and pressured.        Behavior: Behavior is agitated.        Thought Content:  Thought content is paranoid and delusional.   Review of Systems  Constitutional:  Negative for fever.  HENT:  Negative for congestion, sinus pain and sore throat.   Respiratory:  Negative for cough and shortness of breath.   Cardiovascular:  Negative for chest pain.  Genitourinary: Negative.   Musculoskeletal: Negative.   Neurological: Negative.    Blood pressure 118/80, pulse 95, temperature 97.7 F (36.5 C), temperature source Oral, resp. rate 18, height  (1.626 m), weight 98.9 kg, SpO2 100 %. Body mass index is 37.42 kg/m.   Treatment Plan Summary: Daily contact with patient to assess and evaluate symptoms and progress in treatment and Medication management  MDD recurrent, current episode severe: Continue Cymbalta 30 mg PO daily Continue Seroquel 50 mg PO QHS   Insomnia: Schedule Trazodone 100 mg PO at bedtime   Anxiety: Continue Vistaril 25 mg PO TID PRN   GERD: Continue Protonix 40 mg PO daily   Nausea: Continue  Zofran 4 mg PO every 8 hours as needed   Diabetes mellitus Type II: Discontinue Metformin 1,000 mg PO BID Initiate Metformin XL 2,000 daily with dinner   Continue IVC status Continue every 15 minute safety checks Encourage participation in the therapeutic milieu Discharge planning in progress.    Laveda Abbe, NP 06/02/2021, 1:37 PM
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
## 2021-06-02 NOTE — Progress Notes (Signed)
Adult Psychoeducational Group Note  Date:  06/02/2021 Time:  9:39 PM  Group Topic/Focus:  Wrap-Up Group:   The focus of this group is to help patients review their daily goal of treatment and discuss progress on daily workbooks.  Participation Level:  None  Participation Quality:   None  Affect:   None  Cognitive:   None  Insight: None  Engagement in Group:   None  Modes of Intervention:   None  Additional Comments:  Pt attend wrap up group tonight but choose not to participate in group.  Felipa Furnace 06/02/2021, 9:39 PM
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
## 2021-06-03 LAB — GLUCOSE, CAPILLARY
Glucose-Capillary: 192 mg/dL — ABNORMAL HIGH (ref 70–99)
Glucose-Capillary: 165 mg/dL — ABNORMAL HIGH (ref 70–99)
Glucose-Capillary: 271 mg/dL — ABNORMAL HIGH (ref 70–99)

## 2021-06-03 MED ORDER — ONDANSETRON 4 MG PO TBDP: 4.0000 mg | ORAL_TABLET | Freq: Four times a day (QID) | ORAL | Status: DC | PRN

## 2021-06-03 MED ORDER — ZIPRASIDONE MESYLATE 20 MG IM SOLR: 20.0000 mg | Freq: Two times a day (BID) | INTRAMUSCULAR | Status: DC | PRN

## 2021-06-03 MED ORDER — QUETIAPINE FUMARATE 200 MG PO TABS
200.0000 mg | ORAL_TABLET | Freq: Every day | ORAL | Status: DC
  Administered 2021-06-03: 200 mg via ORAL
  Filled 2021-06-03 (×4): qty 1

## 2021-06-03 NOTE — Progress Notes (Signed)
Pt did not attend to orientation/goals group.
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
## 2021-06-03 NOTE — Progress Notes (Signed)
Adult Psychoeducational Group Note  Date:  06/03/2021 Time:  10:52 PM  Group Topic/Focus:  Wrap-Up Group:   The focus of this group is to help patients review their daily goal of treatment and discuss progress on daily workbooks.  Participation Level:  Did Not Attend  Participation Quality:   Did Not Attend  Affect:   Did Not Attend  Cognitive:   Did Not Attend  Insight: None  Engagement in Group:   Did Not Attend  Modes of Intervention:   Did Not Attend  Additional Comments:  Pt did not attend tonight wrap up group.  Felipa Furnace 06/03/2021, 10:52 PM
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
## 2021-06-03 NOTE — Progress Notes (Signed)
Recreation Therapy Notes  Date: 7.26.22 Time: 1000 Location: 500 Hall Dayroom  Group Topic: Self Expression  Goal Area(s) Addresses:  Patient will successfully identify lessons learned throughout the years.  Patient will successfully identify how those lessons have shaped their lives.  Intervention: Pencils, Colored Pencils, Wellsite geologist  Activity: Letter To My Younger Self.  Patients were given a blank mirror handout.  Patients were asked to think about the things they have learned over the years.  Patients were then asked to write a letter to their younger selves giving wisdom about what they have learned to help their younger self avoid some of the pitfalls they have faced.  Education:  Self-Esteem, Discharge Planning  Education Outcome: Acknowledges education/In group clarification offered/Needs additional education  Clinical Observations/Feedback: Pt did not attend group session.    Caroll Rancher, LRT/CTRS        Lillia Abed, Darnesha Diloreto A 06/03/2021 1:14 PM

## 2021-06-03 NOTE — Progress Notes (Signed)
Pt says she has a cream that she uses at home for her skin and that she was told to have it brought in. Clindamycin and Benzoyl Peroxide topical gel 1%/5% arrived and was placed in pt's med drawer in med room.

## 2021-06-03 NOTE — Progress Notes (Signed)
Pt at med window to get her morning Synthroid and asking for Zyprexa. Pt told she has Vistaril for anxiety and Zyprexa for agitation. Pt stated, "I am agitated." When asked what was making her agitated, she said, "Everything. Just being here is making me agitated." Pt given Zyprexa 5 mg.
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
## 2021-06-03 NOTE — Progress Notes (Signed)
Inpatient Diabetes Program Recommendations  AACE/ADA: New Consensus Statement on Inpatient Glycemic Control (2015)  Target Ranges:  Prepandial:   less than 140 mg/dL      Peak postprandial:   less than 180 mg/dL (1-2 hours)      Critically ill patients:  140 - 180 mg/dL   Lab Results  Component Value Date   GLUCAP 192 (H) 06/03/2021   HGBA1C 8.1 (H) 05/31/2021    Review of Glycemic Control Results for Erika Rhodes, Erika Rhodes (MRN [id number]) as of 06/03/2021 10:31  Ref. Range 06/01/2021 05:46 06/02/2021 05:50 06/02/2021 11:19 06/02/2021 19:05 06/03/2021 06:16  Glucose-Capillary Latest Ref Range: 70 - 99 mg/dL 301 (H) 601 (H) 093 (H) 295 (H) 192 (H)   Diabetes history: DM 2 Outpatient Diabetes medications: Metformin 500 mg bid Current orders for Inpatient glycemic control:  Metformin 2000 mg daily at supper  A1c 8.1% on 7/23  Inpatient Diabetes Program Recommendations:    -  add Glipizide 2.5 mg Daily  Thanks,  Christena Deem RN, MSN, BC-ADM Inpatient Diabetes Coordinator Team Pager [phone] (8a-5p)

## 2021-06-04 LAB — GLUCOSE, CAPILLARY
Glucose-Capillary: 181 mg/dL — ABNORMAL HIGH (ref 70–99)
Glucose-Capillary: 206 mg/dL — ABNORMAL HIGH (ref 70–99)
Glucose-Capillary: 267 mg/dL — ABNORMAL HIGH (ref 70–99)

## 2021-06-04 MED ORDER — QUETIAPINE FUMARATE 400 MG PO TABS
400.0000 mg | ORAL_TABLET | Freq: Every day | ORAL | Status: DC
  Administered 2021-06-04: 400 mg via ORAL
  Filled 2021-06-04 (×2): qty 1

## 2021-06-04 MED ORDER — OLANZAPINE 10 MG PO TBDP
10.0000 mg | ORAL_TABLET | Freq: Once | ORAL | Status: AC
  Administered 2021-06-04: 10 mg via ORAL
  Filled 2021-06-04 (×2): qty 1

## 2021-06-04 NOTE — [facility] Group Notes (Signed)
[facility] [name] Group Therapy Note  Date/Time: 06/04/2021  Type of Therapy and Topic:  Group Therapy:  Strengths and Qualities  Participation Level: Did not attend  Description of Group:    In this group patients will be asked to explore and define the terms strength ans qualities.  Patients will be guided to discuss their thoughts, feelings, and behaviors as to where strengths and qualities originate. Participants will then list some of their strengths and qualities related to each subject topic. This group will be process-oriented, with patients participating in exploration of their own experiences as well as giving and receiving support and challenge from other group members.  Therapeutic Goals: Patient will identify specific strengths related to their personal life. Patient will identify feelings, thoughts, and beliefs about strengths and qualities. Patient will identify ways their strengths have been used. . Patient will identify situations where they have helped others or made someone else happy. .  Summary of Patient Progress:  Did not attend     Therapeutic Modalities:   Cognitive Behavioral Therapy Solution Focused Therapy Motivational Interviewing Brief Therapy    Tajuanna Burnett, [name], LCAS Clincal Social Worker  [facility]
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
## 2021-06-04 NOTE — [facility] Group Notes (Signed)
Adult Psychoeducational Group Note  Date:  06/04/2021 Time:  4:59 PM  Group Topic/Focus:  Overcoming Stress:   The focus of this group is to define stress and help patients assess their triggers.  Participation Level:  Did Not Attend   Erika Rhodes 06/04/2021, 4:59 PM

## 2021-06-04 NOTE — Progress Notes (Signed)
Pt received PRN Zyprexa 5 mg PO at 0811 for agitation "y'all keeping here is just fucking pissing me off. I'm here without much to stimulate me, I feel like I'm being punished". Presents with blunted affect, irritable on initial contact but brightened up after PRN Zyprexa when reassessed and was more engaged with peers. Attended groups but walked out of some and did not stay. Remain preoccupied about getting discharged "My friend needs to talk to your doctor and get me the hell out of here and y'all can't let that happen". Pt received a one time order for Zyprexa 10 mg PO as ordered for increased agitation at 1629. Pt calmer and redirectable when reassessed at 1725. Tolerates meals, fluids and medications well when offered. Denies SI, HI, AVH and pain when assessed. Support and encouragement offered. Safety checks maintained at Q 15 minutes intervals without self harm gestures. Pt remains on unit restriction for mood lability.

## 2021-06-04 NOTE — [facility] Group Notes (Signed)
The focus of this group is to help patients establish daily goals to achieve during treatment and discuss how the patient can incorporate goal setting into their daily lives to aide in recovery.  Pt attended group, she stated that her goal is to discharge today.

## 2021-06-04 NOTE — Progress Notes (Signed)
Recreation Therapy Notes  Date: 7.27.22 Time: 1005 Location: [address] Day Room   Group Topic: Leisure Education    Goal Area(s) Addresses:  Patient will successfully identify benefits of leisure participation. Patient will successfully identify ways to access leisure activities. Patient will identify leisure activities available based on their geographical location in proximity to their home. Patient will follow directions on first prompt.   Intervention: Construction Paper, Markers, Scissors, Glue Sticks, Magazines   Activity: Patients were to select an activity provided by LRT.  Patients were to then create a PSA poster describing the activity.  Patients also explained the benefits, equipment needed, where it can be done and who can do the activity.  Patients were to be as creative as needed and presented PSAs to group when completed.    Education:  Leisure Education, Discharge Planning   Education Outcome: Acknowledges education   Clinical Observations/Feedback: Pt did not attend group session.      Jahan Friedlander, LRT/CTRS         Ailana Cuadrado A 06/04/2021 1:04 PM 

## 2021-06-04 NOTE — Progress Notes (Signed)
Adult Psychoeducational Group Note  Date:  06/04/2021 Time:  9:41 PM  Group Topic/Focus:  Wrap-Up Group:   The focus of this group is to help patients review their daily goal of treatment and discuss progress on daily workbooks.  Participation Level:  Active  Participation Quality:  Appropriate  Affect:  Appropriate  Cognitive:  Appropriate  Insight: Appropriate  Engagement in Group:  Engaged  Modes of Intervention:  Education  Additional Comments:  Patient attended and participated in group tonight. She reports that the significant thing that happen today was that she will be leaving tomorrow.  Lita Mains [facility] 06/04/2021, 9:41 PM

## 2021-06-05 LAB — GLUCOSE, CAPILLARY: Glucose-Capillary: 146 mg/dL — ABNORMAL HIGH (ref 70–99)

## 2021-06-05 MED ORDER — QUETIAPINE FUMARATE 400 MG PO TABS: 400.0000 mg | ORAL_TABLET | Freq: Every day | ORAL | 0 refills | Status: DC

## 2021-06-05 MED ORDER — DULOXETINE HCL 30 MG PO CPEP: 30.0000 mg | ORAL_CAPSULE | Freq: Every day | ORAL | 0 refills | Status: DC

## 2021-06-05 MED ORDER — METFORMIN HCL ER 500 MG PO TB24
2000.0000 mg | ORAL_TABLET | Freq: Every day | ORAL | Status: DC
  Filled 2021-06-05: qty 28

## 2021-06-05 MED ORDER — PANTOPRAZOLE SODIUM 40 MG PO TBEC: 40.0000 mg | DELAYED_RELEASE_TABLET | Freq: Every day | ORAL | 0 refills | Status: DC

## 2021-06-05 MED ORDER — METFORMIN HCL ER (OSM) 1000 MG PO TB24: 2000.0000 mg | ORAL_TABLET | Freq: Every day | ORAL | 0 refills | Status: DC

## 2021-06-05 NOTE — Plan of Care (Signed)
Patient did not complete goal of identifying 3 triggers for anxiety because patient did not attend group sessions.     Caroll Rancher, LRT/CTRS

## 2021-06-05 NOTE — Progress Notes (Signed)
Recreation Therapy Notes  INPATIENT RECREATION TR PLAN  Patient Details Name: Erika Rhodes MRN: [id number] DOB: 11/28/1980 Today's Date: 06/05/2021  Rec Therapy Plan Is patient appropriate for Therapeutic Recreation?: Yes Treatment times per week: about 3 days Estimated Length of Stay: 5-7 days TR Treatment/Interventions: Group participation (Comment)  Discharge Criteria Pt will be discharged from therapy if:: Discharged Treatment plan/goals/alternatives discussed and agreed upon by:: Patient/family  Discharge Summary Short term goals set: See patient care plan Short term goals met: Not met Reason goals not met: Pt did not attend group sessions. Therapeutic equipment acquired: N/A Reason patient discharged from therapy: Discharge from hospital Pt/family agrees with progress & goals achieved: Yes Date patient discharged from therapy: 06/05/21    Victorino Sparrow, LRT/CTRS   Ria Comment, Densil Ottey A 06/05/2021, 11:38 AM

## 2021-06-05 NOTE — Progress Notes (Signed)
D: Pt A & O X 3. Denies SI, HI, AVH and pain at this time. D/C home as ordered. Picked up in front of lobby by [employer]. A: D/C instructions reviewed with pt including prescriptions and follow up appointment; compliance encouraged. All belongings from assigned locker given to pt at time of departure. Scheduled and PRN medications given with verbal education and effects monitored. Safety checks maintained without incident till time of d/c.  R: Pt receptive to care. Compliant with medications when offered. Denies adverse drug reactions when assessed. Verbalized understanding related to d/c instructions. Signed belonging sheet in agreement with items received from locker. Ambulatory with a steady gait. Appears to be in no physical distress at time of departure.

## 2021-06-05 NOTE — Progress Notes (Signed)
Recreation Therapy Notes  Date: 7.28.22 Time: 1000 Location: [address] Dayroom  Group Topic: Communication  Goal Area(s) Addresses:  Patient will effectively communicate with peers in group.  Patient will verbalize benefit of healthy communication. Patient will verbalize positive effect of healthy communication on post d/c goals.   Intervention: Merchandiser, retail, Blank Paper, Pencils   Activity: What You Say.  Three patients would get to be speakers.  They were to describe a picture to the group in as much detail as possible.  The remaining patients were to draw the picture how it is described to them.  The patients that are the listeners, could only as the speakers to repeat themselves.  They could not ask any detailed questions.  After each turn, the speaker will show the group the original picture and the group will compare their pictures to the original.  Education: Communication, Discharge Planning  Education Outcome: Acknowledges understanding/In group clarification offered/Needs additional education.   Clinical Observations/Feedback:  Pt did not attend group session.     Caroll Rancher, LRT/CTRS     Lillia Abed, Teyah Rossy A 06/05/2021 11:26 AM

## 2021-06-05 NOTE — BHH Suicide Risk Assessment (Signed)
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 [facility])   Total Time spent with patient: 15 minutes  Musculoskeletal: Strength & Muscle Tone: within normal limits Gait & Station: normal Patient leans: N/A  Psychiatric Specialty Exam  Presentation  General Appearance: Disheveled  Eye Contact:Minimal  Speech:Pressured  Speech Volume:Increased  Handedness:Right   Mood and Affect  Mood:Labile  Duration of Depression Symptoms: No data recorded Affect:Congruent   Thought Process  Thought Processes:Goal Directed  Descriptions of Associations:Circumstantial  Orientation:Full (Time, Place and Person)  Thought Content:Rumination  History of Schizophrenia/Schizoaffective disorder:No  Duration of Psychotic Symptoms:Less than six months  Hallucinations:No data recorded Ideas of Reference:None  Suicidal Thoughts:No data recorded Homicidal Thoughts:No data recorded  Sensorium  Memory:Immediate Poor; Recent Poor; Remote Poor  Judgment:Impaired  Insight:Lacking   Executive Functions  Concentration:Fair  Attention Span:Fair  Recall:Fair  Fund of Knowledge:Fair  Language:Fair   Psychomotor Activity  Psychomotor Activity: No data recorded  Assets  Assets:Desire for Improvement; Resilience   Sleep  Sleep: No data recorded  Physical Exam: Physical Exam Vitals and nursing note reviewed.  Constitutional:      Appearance: Normal appearance. She is obese.  HENT:     Head: Normocephalic and atraumatic.  Pulmonary:     Effort: Pulmonary effort is normal.  Neurological:     General: No focal deficit present.     Mental Status: She  is alert and oriented to person, place, and time.   Review of Systems  All other systems reviewed and are negative. Blood pressure 125/87, pulse (!) 135, temperature (!) 97.5 F (36.4 C), temperature source Oral, resp. rate 18, height 5\' 4"  (1.626 m), weight 98.9 kg, SpO2 100 %. Body mass index is 37.42 kg/m.  Mental Status Per Nursing Assessment::   On Admission:  Thoughts of violence towards others  Demographic Factors:  Divorced or widowed, Caucasian, Low socioeconomic status, and Unemployed  Loss Factors: Financial problems/change in socioeconomic status  Historical Factors: Impulsivity  Risk Reduction Factors:   Living with another person, especially a relative  Continued Clinical Symptoms:  Bipolar Disorder:   Mixed State  Cognitive Features That Contribute To Risk:  Polarized thinking    Suicide Risk:  Minimal: No identifiable suicidal ideation.  Patients presenting with no risk factors but with morbid ruminations; may be classified as minimal risk based on the severity of the depressive symptoms    Plan Of Care/Follow-up recommendations:  Activity:  ad lib  [phone], MD 06/05/2021, 7:43 AM
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
## 2021-06-05 NOTE — Progress Notes (Signed)
  [facility] Adult Case Management Discharge Plan :  Will you be returning to the same living situation after discharge:  Yes,  home residence At discharge, do you have transportation home?: No. Will need safe transport Do you have the ability to pay for your medications: Yes,  provided resources for discounted medications and places to go for med management and therapy for individuals without insurance.   Release of information consent forms completed and in the chart;  Patient's signature needed at discharge.  Patient to Follow up at:  Follow-up Information     [facility]. Go to.   Why: Go for walk in medication managment and therapy appointment.  Monday through Wednesday 8am-11am.  Recommended to arrive at 7:45am.  This is a first come, first serve basis. Contact information: [address] [place], [place] [address] [phone]                Next level of care provider has access to [facility] Link:yes  Safety Planning and Suicide Prevention discussed: Yes,  with the patient. Patient denied consent to speak with support person.   Have you used any form of tobacco in the last 30 days? (Cigarettes, Smokeless Tobacco, Cigars, and/or Pipes): No  Has patient been referred to the Quitline?: N/A patient is not a smoker  Patient has been referred for addiction treatment: N/A  Chea Malan E Jkayla Spiewak, [name] 06/05/2021, 10:23 AM

## 2021-09-01 ENCOUNTER — Observation Stay (HOSPITAL_COMMUNITY)
Admission: EM | Admit: 2021-09-01 | Discharge: 2021-09-02 | Disposition: A | Discharge status: Other Acute Inpatient Hospital | Payer: Medicaid Other | Attending: Internal Medicine | Admitting: Internal Medicine

## 2021-09-01 ENCOUNTER — Other Ambulatory Visit: Payer: Self-pay

## 2021-09-01 ENCOUNTER — Encounter (HOSPITAL_COMMUNITY): Payer: Self-pay

## 2021-09-01 DIAGNOSIS — E1165 Type 2 diabetes mellitus with hyperglycemia: Secondary | ICD-10-CM | POA: Diagnosis present

## 2021-09-01 DIAGNOSIS — E876 Hypokalemia: Secondary | ICD-10-CM | POA: Diagnosis present

## 2021-09-01 DIAGNOSIS — T50902A Poisoning by unspecified drugs, medicaments and biological substances, intentional self-harm, initial encounter: Secondary | ICD-10-CM

## 2021-09-01 DIAGNOSIS — Z79899 Other long term (current) drug therapy: Secondary | ICD-10-CM | POA: Insufficient documentation

## 2021-09-01 DIAGNOSIS — Z7984 Long term (current) use of oral hypoglycemic drugs: Secondary | ICD-10-CM | POA: Insufficient documentation

## 2021-09-01 DIAGNOSIS — R45851 Suicidal ideations: Secondary | ICD-10-CM

## 2021-09-01 DIAGNOSIS — T1491XA Suicide attempt, initial encounter: Secondary | ICD-10-CM

## 2021-09-01 DIAGNOSIS — Z20822 Contact with and (suspected) exposure to covid-19: Secondary | ICD-10-CM | POA: Insufficient documentation

## 2021-09-01 DIAGNOSIS — R9431 Abnormal electrocardiogram [ECG] [EKG]: Secondary | ICD-10-CM | POA: Diagnosis present

## 2021-09-01 DIAGNOSIS — Y900 Blood alcohol level of less than 20 mg/100 ml: Secondary | ICD-10-CM | POA: Insufficient documentation

## 2021-09-01 DIAGNOSIS — E119 Type 2 diabetes mellitus without complications: Secondary | ICD-10-CM | POA: Insufficient documentation

## 2021-09-01 DIAGNOSIS — E039 Hypothyroidism, unspecified: Secondary | ICD-10-CM | POA: Diagnosis present

## 2021-09-01 DIAGNOSIS — J45909 Unspecified asthma, uncomplicated: Secondary | ICD-10-CM | POA: Diagnosis present

## 2021-09-01 HISTORY — DX: Poisoning by unspecified drugs, medicaments and biological substances, intentional self-harm, initial encounter: T50.902A

## 2021-09-01 LAB — CBC WITH DIFFERENTIAL/PLATELET
Abs Immature Granulocytes: 0.01 10*3/uL (ref 0.00–0.07)
Basophils Absolute: 0 10*3/uL (ref 0.0–0.1)
Basophils Relative: 0 %
Eosinophils Absolute: 0.1 10*3/uL (ref 0.0–0.5)
Eosinophils Relative: 2 %
HCT: 41 % (ref 36.0–46.0)
Hemoglobin: 14.5 g/dL (ref 12.0–15.0)
Immature Granulocytes: 0 %
Lymphocytes Relative: 24 %
Lymphs Abs: 1.6 10*3/uL (ref 0.7–4.0)
MCH: 31.7 pg (ref 26.0–34.0)
MCHC: 35.4 g/dL (ref 30.0–36.0)
MCV: 89.7 fL (ref 80.0–100.0)
Monocytes Absolute: 0.5 10*3/uL (ref 0.1–1.0)
Monocytes Relative: 7 %
Neutro Abs: 4.5 10*3/uL (ref 1.7–7.7)
Neutrophils Relative %: 67 %
Platelets: 276 10*3/uL (ref 150–400)
RBC: 4.57 MIL/uL (ref 3.87–5.11)
RDW: 12.7 % (ref 11.5–15.5)
WBC: 6.7 10*3/uL (ref 4.0–10.5)
nRBC: 0 % (ref 0.0–0.2)

## 2021-09-01 LAB — BLOOD GAS, VENOUS
Acid-base deficit: 1.6 mmol/L (ref 0.0–2.0)
Bicarbonate: 21.7 mmol/L (ref 20.0–28.0)
FIO2: 21
O2 Saturation: 89.4 %
Patient temperature: 98.6
pCO2, Ven: 33.8 mmHg — ABNORMAL LOW (ref 44.0–60.0)
pH, Ven: 7.423 (ref 7.250–7.430)
pO2, Ven: 56.7 mmHg — ABNORMAL HIGH (ref 32.0–45.0)

## 2021-09-01 LAB — ACETAMINOPHEN LEVEL
Acetaminophen (Tylenol), Serum: <10 ug/mL — ABNORMAL LOW (ref 10–30)
Acetaminophen (Tylenol), Serum: <10 ug/mL — ABNORMAL LOW (ref 10–30)

## 2021-09-01 LAB — COMPREHENSIVE METABOLIC PANEL
ALT: 54 U/L — ABNORMAL HIGH (ref 0–44)
AST: 55 U/L — ABNORMAL HIGH (ref 15–41)
Albumin: 4.4 g/dL (ref 3.5–5.0)
Alkaline Phosphatase: 76 U/L (ref 38–126)
Anion gap: 15 (ref 5–15)
BUN: 14 mg/dL (ref 6–20)
CO2: 18 mmol/L — ABNORMAL LOW (ref 22–32)
Calcium: 9.2 mg/dL (ref 8.9–10.3)
Chloride: 101 mmol/L (ref 98–111)
Creatinine, Ser: 0.9 mg/dL (ref 0.44–1.00)
GFR, Estimated: >60 mL/min (ref >60)
Glucose, Bld: 328 mg/dL — ABNORMAL HIGH (ref 70–99)
Potassium: 3 mmol/L — ABNORMAL LOW (ref 3.5–5.1)
Sodium: 134 mmol/L — ABNORMAL LOW (ref 135–145)
Total Bilirubin: 1.1 mg/dL (ref 0.3–1.2)
Total Protein: 7.6 g/dL (ref 6.5–8.1)

## 2021-09-01 LAB — RAPID URINE DRUG SCREEN, HOSP PERFORMED
Amphetamines: NOT DETECTED
Barbiturates: NOT DETECTED
Benzodiazepines: NOT DETECTED
Cocaine: NOT DETECTED
Opiates: NOT DETECTED
Tetrahydrocannabinol: POSITIVE — AB

## 2021-09-01 LAB — URINALYSIS, ROUTINE W REFLEX MICROSCOPIC
Bilirubin Urine: NEGATIVE
Glucose, UA: >=500 mg/dL — AB
Hgb urine dipstick: NEGATIVE
Ketones, ur: 20 mg/dL — AB
Leukocytes,Ua: NEGATIVE
Nitrite: NEGATIVE
Protein, ur: NEGATIVE mg/dL
Specific Gravity, Urine: 1.005 (ref 1.005–1.030)
pH: 7 (ref 5.0–8.0)

## 2021-09-01 LAB — I-STAT BETA HCG BLOOD, ED (MC, WL, AP ONLY): I-stat hCG, quantitative: <5 m[IU]/mL (ref <5)

## 2021-09-01 LAB — RESP PANEL BY RT-PCR (FLU A&B, COVID) ARPGX2
Influenza A by PCR: NEGATIVE
Influenza B by PCR: NEGATIVE
SARS Coronavirus 2 by RT PCR: NEGATIVE

## 2021-09-01 LAB — GLUCOSE, CAPILLARY
Glucose-Capillary: 176 mg/dL — ABNORMAL HIGH (ref 70–99)
Glucose-Capillary: 143 mg/dL — ABNORMAL HIGH (ref 70–99)

## 2021-09-01 LAB — ETHANOL: Alcohol, Ethyl (B): <10 mg/dL (ref <10)

## 2021-09-01 LAB — MRSA NEXT GEN BY PCR, NASAL: MRSA by PCR Next Gen: NOT DETECTED

## 2021-09-01 LAB — SALICYLATE LEVEL: Salicylate Lvl: <7 mg/dL — ABNORMAL LOW (ref 7.0–30.0)

## 2021-09-01 LAB — MAGNESIUM: Magnesium: 1.8 mg/dL (ref 1.7–2.4)

## 2021-09-01 MED ORDER — MAGNESIUM SULFATE 2 GM/50ML IV SOLN
2.0000 g | Freq: Once | INTRAVENOUS | Status: AC
  Administered 2021-09-01: 2 g via INTRAVENOUS
  Filled 2021-09-01: qty 50

## 2021-09-01 MED ORDER — POTASSIUM CHLORIDE IN NACL 20-0.9 MEQ/L-% IV SOLN
INTRAVENOUS | Status: DC
  Filled 2021-09-01: qty 1000

## 2021-09-01 MED ORDER — PNEUMOCOCCAL VAC POLYVALENT 25 MCG/0.5ML IJ INJ
0.5000 mL | INJECTION | INTRAMUSCULAR | Status: DC
  Filled 2021-09-01: qty 0.5

## 2021-09-01 MED ORDER — POTASSIUM CHLORIDE CRYS ER 20 MEQ PO TBCR
40.0000 meq | EXTENDED_RELEASE_TABLET | Freq: Once | ORAL | Status: DC
  Filled 2021-09-01: qty 2

## 2021-09-01 MED ORDER — POTASSIUM CHLORIDE IN NACL 20-0.9 MEQ/L-% IV SOLN
INTRAVENOUS | Status: AC
  Filled 2021-09-01: qty 1000

## 2021-09-01 MED ORDER — INSULIN ASPART 100 UNIT/ML IJ SOLN
0.0000 [IU] | Freq: Three times a day (TID) | INTRAMUSCULAR | Status: DC
Start: 2021-09-02 — End: 2021-09-02
  Administered 2021-09-02 (×2): 3 [IU] via SUBCUTANEOUS

## 2021-09-01 MED ORDER — PROCHLORPERAZINE EDISYLATE 10 MG/2ML IJ SOLN: 5.0000 mg | INTRAMUSCULAR | Status: DC | PRN

## 2021-09-01 MED ORDER — SODIUM CHLORIDE (PF) 0.9 % IJ SOLN
INTRAMUSCULAR | Status: AC
  Filled 2021-09-01: qty 10

## 2021-09-01 MED ORDER — INFLUENZA VAC SPLIT QUAD 0.5 ML IM SUSY: 0.5000 mL | PREFILLED_SYRINGE | INTRAMUSCULAR | Status: DC

## 2021-09-01 MED ORDER — LORAZEPAM 2 MG/ML IJ SOLN
1.0000 mg | Freq: Once | INTRAMUSCULAR | Status: AC
  Administered 2021-09-01: 1 mg via INTRAVENOUS
  Filled 2021-09-01: qty 1

## 2021-09-01 MED ORDER — POTASSIUM CHLORIDE 10 MEQ/100ML IV SOLN
10.0000 meq | Freq: Once | INTRAVENOUS | Status: DC
  Filled 2021-09-01: qty 100

## 2021-09-01 MED ORDER — ENOXAPARIN SODIUM 40 MG/0.4ML IJ SOSY
40.0000 mg | PREFILLED_SYRINGE | INTRAMUSCULAR | Status: DC
  Administered 2021-09-01: 40 mg via SUBCUTANEOUS
  Filled 2021-09-01: qty 0.4

## 2021-09-01 MED ORDER — SODIUM CHLORIDE 0.9 % IV BOLUS
1000.0000 mL | Freq: Once | INTRAVENOUS | Status: AC
  Administered 2021-09-01: 1000 mL via INTRAVENOUS

## 2021-09-01 MED ORDER — ALUM & MAG HYDROXIDE-SIMETH 200-200-20 MG/5ML PO SUSP
15.0000 mL | ORAL | Status: DC | PRN
  Administered 2021-09-02: 15 mL via ORAL
  Filled 2021-09-01: qty 30

## 2021-09-01 MED ORDER — LORAZEPAM 2 MG/ML IJ SOLN
1.0000 mg | Freq: Four times a day (QID) | INTRAMUSCULAR | Status: DC | PRN
  Administered 2021-09-01 – 2021-09-02 (×2): 1 mg via INTRAVENOUS
  Filled 2021-09-01 (×2): qty 1

## 2021-09-01 MED ORDER — CHLORHEXIDINE GLUCONATE CLOTH 2 % EX PADS
6.0000 | MEDICATED_PAD | Freq: Every day | CUTANEOUS | Status: DC
  Administered 2021-09-01 – 2021-09-02 (×2): 6 via TOPICAL

## 2021-09-01 MED ORDER — LIP MEDEX EX OINT
TOPICAL_OINTMENT | CUTANEOUS | Status: DC | PRN
  Filled 2021-09-01: qty 7

## 2021-09-01 MED ORDER — POTASSIUM CHLORIDE IN NACL 20-0.9 MEQ/L-% IV SOLN
INTRAVENOUS | Status: DC
  Filled 2021-09-01 (×6): qty 1000

## 2021-09-01 NOTE — ED Notes (Signed)
Spoke w/ Kathlene November from poison control.  Per Kathlene November, as substance, amount and time of ingestion remain unknown, pt will be a 24 hour observation.  Obtain EKG, basic labs, magnesium level, 4 hour repeat tylenol level.  4 hour repeat EKG. Use fluids and benzodiazepines for tachycardia.  Replace potassium and magnesium to higher end of WNL. Pt to remain on cardiac monitor.

## 2021-09-01 NOTE — Progress Notes (Addendum)
Patient is requesting anxiety medication. Emotional support offered. Will continue to monitor.

## 2021-09-01 NOTE — ED Triage Notes (Signed)
Pt. BIB [facility] c/o OD on unknown substance. EMS states that pt. told them "I took pills to kill myself." Empty bottle of Benadryl found on scene per EMS. Amount of substance taken also unknown.   EMS VS:  BP:128/96 Pulse: 156 CBG: 340 O2: 100 RA

## 2021-09-01 NOTE — ED Notes (Signed)
Pt changed into burgundy scrubs. Pt has one personal belonging's bag that includes her t-shirt, vape, journal and paperwork. Bag placed in cabinets: Rm 5-8. Security wanded pt.

## 2021-09-01 NOTE — ED Notes (Signed)
Pt ambulatory to bathroom w/ no assist. Standby for safety

## 2021-09-01 NOTE — Progress Notes (Signed)
Spoke with Tacey Ruiz, at [facility] control with an update, call back number is [phone].  Advised to watch patient for seizure like activity.

## 2021-09-01 NOTE — ED Provider Notes (Addendum)
[facility]-EMERGENCY DEPT Provider Note   CSN: [id number] Arrival date & time: 09/01/21  1252     History Chief Complaint  Patient presents with   Ingestion    Overdose on unknown medication    Erika Rhodes is a 40 y.o. female.  The history is provided by the patient.  Mental Health Problem Presenting symptoms: suicidal thoughts and suicide attempt (unknown pills but per EMS possibly benadryl and ivermectin at the house/scene?)   Degree of incapacity (severity):  Severe Timing:  Constant Progression:  Unchanged Chronicity:  Recurrent Associated symptoms: no abdominal pain and no chest pain   Risk factors: hx of mental illness       Past Medical History:  Diagnosis Date   Allergic reaction    Anxiety    Asthma    Depression    DM (diabetes mellitus), gestational    Migraines    Rheumatoid arthritis (HCC) 2016   Diagnosed Dr.  Patsi Sears; has been treated with MTX, Humira, etc, but made her sick.  Was taking CBD oil and stopped as tested positive in a drug screen for work.    Patient Active Problem List   Diagnosis Date Noted   MDD (major depressive disorder), recurrent, severe, with psychosis (HCC) 05/31/2021   Psychosis, affective (HCC) 05/30/2021   Psychosis, unspecified psychosis type (HCC) 05/30/2021   MDD (major depressive disorder), recurrent episode, severe (HCC) 01/12/2021   PTSD (post-traumatic stress disorder) 01/12/2021   Attention deficit hyperactivity disorder, 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/2021   Bilateral carpal tunnel syndrome 05/31/2020   Numbness and tingling of both feet 05/31/2020   Anxiety 04/29/2020   Agoraphobia 04/29/2020   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 [facility]) 10/29/2018   Cannabis use disorder, moderate, dependence (HCC) 03/04/2018   Benzodiazepine-induced psychosis, 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/04/2018   Controlled type 2 diabetes mellitus without complication, with long-term current  use of insulin (HCC) 02/27/2018   Rheumatoid arthritis (HCC) 2016   Endocrine, nutritional and metabolic diseases complicating pregnancy, unspecified trimester 05/24/2014   Asthma 08/10/2013   Epigastric pain 08/10/2013   Hypothyroidism 08/10/2013   Migraine with aura 08/10/2013    Past Surgical History:  Procedure Laterality Date   ABDOMINAL HYSTERECTOMY  2017   Fibroids; Both ovaries intact still   BACK SURGERY     CESAREAN SECTION  2009   CHOLECYSTECTOMY  2010   laparoscopic   TONSILLECTOMY       OB History   No obstetric history on file.     Family History  Problem Relation Age of Onset   Arthritis Mother        Rheumatoid   Depression Daughter        and anxiety   Allergies Son     Social History   Tobacco Use   Smoking status: Never   Smokeless tobacco: Never  Vaping Use   Vaping Use: Former   Substances: CBD  Substance Use Topics   Alcohol use: Not Currently   Drug use: Yes    Types: Benzodiazepines, Marijuana    Home Medications Prior to Admission medications   Medication Sig Start Date End Date Taking? Authorizing Provider  albuterol (VENTOLIN HFA) 108 (90 Base) MCG/ACT inhaler Inhale 1-2 puffs into the lungs every 4 (four) hours as needed for wheezing or shortness of breath. 01/15/21   Laveda Abbe, NP  clindamycin-benzoyl peroxide [facility]) gel Apply 1 application topically 2 (two) times daily. 05/24/21   [provider]  diphenhydrAMINE (BENADRYL) 25 MG tablet  Take 25 mg by mouth every 6 (six) hours as needed for allergies.    [provider]  DULoxetine (CYMBALTA) 30 MG capsule Take 1 capsule (30 mg total) by mouth daily. 06/06/21   Antonieta Pert, MD  levothyroxine (SYNTHROID) 50 MCG tablet Take 1 tablet (50 mcg total) by mouth daily at 6 (six) AM. 01/16/21   Laveda Abbe, NP  MELATONIN PO Take 2 tablets by mouth at bedtime as needed (sleep).    [provider]  metFORMIN (FORTAMET) 1000 MG (OSM) 24  hr tablet Take 2 tablets (2,000 mg total) by mouth daily with supper. 06/05/21   Antonieta Pert, MD  naproxen sodium (ALEVE) 220 MG tablet Take 440 mg by mouth 2 (two) times daily as needed (pain/headache/pain).    [provider]  oxymetazoline (NASAL SPRAY 12 HOUR) 0.05 % nasal spray Place 1 spray into both nostrils 2 (two) times daily as needed for congestion.    [provider]  pantoprazole (PROTONIX) 40 MG tablet Take 1 tablet (40 mg total) by mouth daily. 06/06/21   Antonieta Pert, MD  QUEtiapine (SEROQUEL) 400 MG tablet Take 1 tablet (400 mg total) by mouth at bedtime. 06/05/21   Antonieta Pert, MD  SUMAtriptan (IMITREX) 50 MG tablet Take 1 tablet (50 mg total) by mouth every 2 (two) hours as needed for migraine or headache. May repeat in 2 hours if headache persists or recurs. 01/15/21   Laveda Abbe, NP    Allergies    Azithromycin  Review of Systems   Review of Systems  Constitutional:  Negative for chills and fever.  HENT:  Negative for ear pain and sore throat.   Eyes:  Negative for pain and visual disturbance.  Respiratory:  Negative for cough and shortness of breath.   Cardiovascular:  Negative for chest pain and palpitations.  Gastrointestinal:  Negative for abdominal pain and vomiting.  Genitourinary:  Negative for dysuria and hematuria.  Musculoskeletal:  Negative for arthralgias and back pain.  Skin:  Negative for color change and rash.  Neurological:  Negative for seizures and syncope.  Psychiatric/Behavioral:  Positive for suicidal ideas.   All other systems reviewed and are negative.  Physical Exam Updated Vital Signs BP (!) 126/103   Pulse (!) 119   Temp 97.9 F (36.6 C) (Oral)   Resp 17   SpO2 98%   Physical Exam Vitals and nursing note reviewed.  Constitutional:      General: She is not in acute distress.    Appearance: She is well-developed.  HENT:     Head: Normocephalic and atraumatic.  Eyes:     General: No  visual field deficit.    Extraocular Movements: Extraocular movements intact.     Conjunctiva/sclera: Conjunctivae normal.     Pupils: Pupils are equal, round, and reactive to light.  Cardiovascular:     Rate and Rhythm: Regular rhythm. Tachycardia present.     Heart sounds: No murmur heard. Pulmonary:     Effort: Pulmonary effort is normal. No respiratory distress.     Breath sounds: Normal breath sounds.  Abdominal:     Palpations: Abdomen is soft.     Tenderness: There is no abdominal tenderness.  Musculoskeletal:     Cervical back: Neck supple.  Skin:    General: Skin is warm and dry.     Capillary Refill: Capillary refill takes less than 2 seconds.  Neurological:     General: No focal deficit present.  Mental Status: She is alert.     Cranial Nerves: Cranial nerves 2-12 are intact. No cranial nerve deficit or facial asymmetry.     Sensory: No sensory deficit.     Motor: Motor function is intact. No weakness.     Coordination: Coordination normal.  Psychiatric:        Mood and Affect: Affect is labile.        Speech: Speech is rapid and pressured.        Behavior: Behavior is hyperactive.        Thought Content: Thought content includes suicidal ideation. Thought content includes suicidal plan.        Judgment: Judgment is impulsive.    ED Results / Procedures / Treatments   Labs (all labs ordered are listed, but only abnormal results are displayed) Labs Reviewed  COMPREHENSIVE METABOLIC PANEL - Abnormal; Notable for the following components:      Result Value   Sodium 134 (*)    Potassium 3.0 (*)    CO2 18 (*)    Glucose, Bld 328 (*)    AST 55 (*)    ALT 54 (*)    All other components within normal limits  RAPID URINE DRUG SCREEN, HOSP PERFORMED - Abnormal; Notable for the following components:   Tetrahydrocannabinol POSITIVE (*)    All other components within normal limits  ACETAMINOPHEN LEVEL - Abnormal; Notable for the following components:   Acetaminophen  (Tylenol), Serum <10 (*)    All other components within normal limits  SALICYLATE LEVEL - Abnormal; Notable for the following components:   Salicylate Lvl <7.0 (*)    All other components within normal limits  RESP PANEL BY RT-PCR (FLU A&B, COVID) ARPGX2  ETHANOL  CBC WITH DIFFERENTIAL/PLATELET  MAGNESIUM  ACETAMINOPHEN LEVEL  BLOOD GAS, VENOUS  I-STAT BETA HCG BLOOD, ED ([facility], [facility], [facility] ONLY)  I-STAT VENOUS BLOOD GAS, ED    EKG EKG Interpretation  Date/Time:  Monday September 01 2021 13:18:27 EDT Ventricular Rate:  138 PR Interval:  185 QRS Duration: 82 QT Interval:  355 QTC Calculation: 538 R Axis:   -27 Text Interpretation: Sinus tachycardia Atrial premature complexes Prominent P waves, nondiagnostic Borderline left axis deviation Low voltage, precordial leads Probable anteroseptal infarct, old Prolonged QT interval Confirmed by Virgina Norfolk ([id number]) on 09/01/2021 1:51:11 PM  Radiology No results found.  Procedures Procedures   Medications Ordered in ED Medications  potassium chloride 10 mEq in 100 mL IVPB (has no administration in time range)  potassium chloride SA (KLOR-CON) CR tablet 40 mEq (has no administration in time range)  sodium chloride 0.9 % bolus 1,000 mL (1,000 mLs Intravenous New Bag/Given 09/01/21 1353)  LORazepam (ATIVAN) injection 1 mg (1 mg Intravenous Given 09/01/21 1345)    ED Course  I have reviewed the triage vital signs and the nursing notes.  Pertinent labs & imaging results that were available during my care of the patient were reviewed by me and considered in my medical decision making (see chart for details).    MDM Rules/Calculators/A&P                           Erika Rhodes is here for suicidal ideation/suicide attempt.  Overall unremarkable vitals except for tachycardia in the 130s.  EKG shows a sinus tachycardia.  Prolonged QTC but otherwise unremarkable intervals.  She states that she tried to kill her self or attention.  Patient comes  in with a journal  and medical records of her alleged child taped around her body.  She states that she tried to kill her self today to get attention so that people believe her about her child.  She will not tell me what she took to hurt her self.  EMS states that both at the scene and may be at her house there was some Benadryl and possibly ivermectin.  On her med list there is Seroquel and Benadryl.  Overall neurologically she appears well.  There is no obvious toxidrome on exam.  Will monitor and replete electrolytes as needed.  We will get a Tylenol level now at a 4-hour Tylenol level.  We will give fluid bolus and Ativan to help with tachycardia and agitation at this time.  Nursing staff to contact poison control.   Hemodynamically she is stable at this time.  Medical screening labs at this time are unremarkable.  No significant anemia or electrolyte abnormality.  Urine drug screen and ethanol level are in process.  I have filled out IVC.    Poison control recommends 24-hour medical observation given unknown ingestion.  Recommend benzodiazepines as needed for agitation and seizures.  Urine drug screen is negative except for THC.  Blood glucose is 328.  She has mild hypokalemia with a potassium of 3.  Bicarb is 18.  However anion gap is 15.  No significant leukocytosis or anemia.  Magnesium normal at 1.8.  Tachycardia greatly improvement following Ativan and IV fluids.  Will admit to medicine for further medical clearance.  Will need psychiatry evaluation after medically cleared.  This chart was dictated using voice recognition software.  Despite best efforts to proofread,  errors can occur which can change the documentation meaning.   Final Clinical Impression(s) / ED Diagnoses Final diagnoses:  Suicide attempt [facility])  Intentional overdose, initial encounter [facility])  Suicidal ideation    Rx / DC Orders ED Discharge Orders     None        Virgina Norfolk, DO 09/01/21 1443    Virgina Norfolk,  DO 09/01/21 1445

## 2021-09-01 NOTE — H&P (Signed)
History and Physical    Erika Rhodes MWU:[id number] DOB: 12/06/1980 DOA: 09/01/2021  PCP: Pcp, No  Patient coming from: Home.  I have personally briefly reviewed patient's old medical records in [facility]  Chief Complaint: OD with unknown substance.  HPI: Erika Rhodes is a 40 y.o. female with medical history significant of allergic reaction, anxiety, depression, asthma exacerbation, gestational diabetes, migraine headaches, rheumatoid arthritis who is coming to the emergency department via EMS after she told them that she took pills to kill herself.  There was an empty bottle of Benadryl and also ivermectin on the scene.  The patient is not volunteering further information.  According to Dr. Lockie Mola she had an manifesto that she had written in her her chest.  ED Course: Initial vital signs were temperature 97.9 F, pulse 142, respirations 20, BP 127/101 mmHg O2 sat 100% on room air.  Lab work: A urinalysis showed glucosuria more than 500, ketonuria 20 mg/dL with rare bacteria on microscopic examination.  Venous blood gas showed normal pH.  CBC was normal.  UDS was positive for THC.  Magnesium was 1.8 mg/dL.  CMP showed a sodium 134, potassium 3.0, chloride 101 and CO2 18 mmol/L.  Glucose 328 mg/dL.  Renal function was normal.  AST was 50 finality was 54.  The rest the hepatic functions are normal.  Normal salicylate, acetaminophen and alcohol level.  Lab work: A urinalysis showed glucosuria more than 500, ketonuria 20 mg/dL with rare bacteria on microscopic examination.  Venous blood gas showed normal pH.  CBC was normal.  UDS was positive for THC.  Magnesium was 1.8 mg/dL.  CMP showed a sodium 134, potassium 3.0, chloride 101 and CO2 18 mmol/L.  Glucose 328 mg/dL.  Renal function was normal.  AST was 50 finality was 54.  The rest the hepatic functions are normal.  Normal salicylate, acetaminophen and alcohol level.  Review of Systems: As per HPI otherwise all other systems reviewed  and are negative.  Past Medical History:  Diagnosis Date   Allergic reaction    Anxiety    Asthma    Depression    DM (diabetes mellitus), gestational    Migraines    Rheumatoid arthritis (HCC) 2016   Diagnosed Dr.  Patsi Sears; has been treated with MTX, Humira, etc, but made her sick.  Was taking CBD oil and stopped as tested positive in a drug screen for work.   Past Surgical History:  Procedure Laterality Date   ABDOMINAL HYSTERECTOMY  2017   Fibroids; Both ovaries intact still   BACK SURGERY     CESAREAN SECTION  2009   CHOLECYSTECTOMY  2010   laparoscopic   TONSILLECTOMY     Social History  reports that she has never smoked. She has never used smokeless tobacco. She reports that she does not currently use alcohol. She reports current drug use. Drugs: Benzodiazepines and Marijuana.  Allergies  Allergen Reactions   Azithromycin Anaphylaxis   Family History  Problem Relation Age of Onset   Arthritis Mother        Rheumatoid   Depression Daughter        and anxiety   Allergies Son    Prior to Admission medications   Medication Sig Start Date End Date Taking? Authorizing Provider  albuterol (VENTOLIN HFA) 108 (90 Base) MCG/ACT inhaler Inhale 1-2 puffs into the lungs every 4 (four) hours as needed for wheezing or shortness of breath. 01/15/21   Laveda Abbe, NP  clindamycin-benzoyl peroxide [facility]) gel  Apply 1 application topically 2 (two) times daily. 05/24/21   [provider]  diphenhydrAMINE (BENADRYL) 25 MG tablet Take 25 mg by mouth every 6 (six) hours as needed for allergies.    [provider]  DULoxetine (CYMBALTA) 30 MG capsule Take 1 capsule (30 mg total) by mouth daily. 06/06/21   Antonieta Pert, MD  levothyroxine (SYNTHROID) 50 MCG tablet Take 1 tablet (50 mcg total) by mouth daily at 6 (six) AM. 01/16/21   Laveda Abbe, NP  MELATONIN PO Take 2 tablets by mouth at bedtime as needed (sleep).    [provider]   metFORMIN (FORTAMET) 1000 MG (OSM) 24 hr tablet Take 2 tablets (2,000 mg total) by mouth daily with supper. 06/05/21   Antonieta Pert, MD  naproxen sodium (ALEVE) 220 MG tablet Take 440 mg by mouth 2 (two) times daily as needed (pain/headache/pain).    [provider]  oxymetazoline (NASAL SPRAY 12 HOUR) 0.05 % nasal spray Place 1 spray into both nostrils 2 (two) times daily as needed for congestion.    [provider]  pantoprazole (PROTONIX) 40 MG tablet Take 1 tablet (40 mg total) by mouth daily. 06/06/21   Antonieta Pert, MD  QUEtiapine (SEROQUEL) 400 MG tablet Take 1 tablet (400 mg total) by mouth at bedtime. 06/05/21   Antonieta Pert, MD  SUMAtriptan (IMITREX) 50 MG tablet Take 1 tablet (50 mg total) by mouth every 2 (two) hours as needed for migraine or headache. May repeat in 2 hours if headache persists or recurs. 01/15/21   Laveda Abbe, NP    Physical Exam: Vitals:   09/01/21 1408 09/01/21 1453 09/01/21 1500 09/01/21 1545  BP: (!) 126/103 (!) 133/107 (!) 138/98 (!) 130/109  Pulse: (!) 119 (!) 130 (!) 125 (!) 120  Resp: 17 15 (!) 26 (!) 26  Temp:      TempSrc:      SpO2: 98% 98% 99% 95%    Constitutional: NAD, calm, comfortable Eyes: PERRL, lids and conjunctivae normal ENMT: Mucous membranes are dry.  Posterior pharynx clear of any exudate or lesions. Neck: normal, supple, no masses, no thyromegaly Respiratory: clear to auscultation bilaterally, no wheezing, no crackles. Normal respiratory effort. No accessory muscle use.  Cardiovascular: Sinus tachycardia in the 130s, no murmurs / rubs / gallops. No extremity edema. 2+ pedal pulses. No carotid bruits.  Abdomen: Obese, no distention.  Bowel sounds positive. Soft, no tenderness, no masses palpated. No hepatosplenomegaly.  Musculoskeletal: no clubbing / cyanosis. Good ROM, no contractures. Normal muscle tone.  Skin: no rashes, lesions, ulcers on limited dermatological examination. Neurologic: CN  2-12 grossly intact. Sensation intact, DTR normal. Strength 5/5 in all 4.  Psychiatric: Alert and oriented x 3.  Labs on Admission: I have personally reviewed following labs and imaging studies  CBC: Recent Labs  Lab 09/01/21 1330  WBC 6.7  NEUTROABS 4.5  HGB 14.5  HCT 41.0  MCV 89.7  PLT 276   Basic Metabolic Panel: Recent Labs  Lab 09/01/21 1330  NA 134*  K 3.0*  CL 101  CO2 18*  GLUCOSE 328*  BUN 14  CREATININE 0.90  CALCIUM 9.2  MG 1.8   GFR: CrCl cannot be calculated (Unknown ideal weight.).  Liver Function Tests: Recent Labs  Lab 09/01/21 1330  AST 55*  ALT 54*  ALKPHOS 76  BILITOT 1.1  PROT 7.6  ALBUMIN 4.4   Urine analysis:    Component Value Date/Time   COLORURINE STRAW (  A) 09/01/2021 1342   APPEARANCEUR CLEAR 09/01/2021 1342   LABSPEC 1.005 09/01/2021 1342   PHURINE 7.0 09/01/2021 1342   GLUCOSEU >=500 (A) 09/01/2021 1342   HGBUR NEGATIVE 09/01/2021 1342   BILIRUBINUR NEGATIVE 09/01/2021 1342   KETONESUR 20 (A) 09/01/2021 1342   PROTEINUR NEGATIVE 09/01/2021 1342   NITRITE NEGATIVE 09/01/2021 1342   LEUKOCYTESUR NEGATIVE 09/01/2021 1342   Radiological Exams on Admission: No results found.  EKG: Independently reviewed.  Vent. rate 138 BPM PR interval 185 ms QRS duration 82 ms QT/QTcB 355/538 ms P-R-T axes 50 -27 -44 Sinus tachycardia Atrial premature complexes Prominent P waves, nondiagnostic Borderline left axis deviation Low voltage, precordial leads Probable anteroseptal infarct, old Prolonged QT interval  Assessment/Plan Principal Problem:   Intentional overdose (HCC) Admit to stepdown/observation. Continue IV fluids. Continue close cardio monitoring. Optimize electrolytes. Recheck acetaminophen this evening. Consult behavioral health.  Active Problems:   Hypokalemia Correcting. Magnesium was supplemented. Follow-up potassium level.    Prolonged QT interval Keep electrolytes optimized. Continue IV  fluids. Avoid QT prolonging meds. Recheck EKG in the morning.    Asthma Bronchodilators as needed.    Hypothyroidism Continue levothyroxine 50 mcg p.o. daily.   Type 2 diabetes mellitus with hyperglycemia (HCC) Advance diet to carbohydrate modified as tolerated. CBG monitoring with RI SS.    DVT prophylaxis: Lovenox SQ. Code Status:   Full code. Family Communication:   Disposition Plan:   Patient is from:  Home.  Anticipated DC to:  Behavioral health facility.  Anticipated DC date:  09/02/2021.  Anticipated DC barriers: Clinical status.  Consults called:  Poison control. Admission status:  Observation/stepdown.   Severity of Illness: High severity after ingesting an unknown amount of diphenhydramine and ivermectin.  The patient will remain for close electrocardiographic monitoring and IV hydration.  Bobette Mo MD [facility]  How to contact the [facility] Attending or Consulting provider 7A - 7P or covering provider during after hours 7P -7A, for this patient?   Check the care team in [facility] and look for a) attending/consulting TR[facility] provider listed and b) the [facility] team listed Log into [email or web address] and use [place] universal password to access. If you do not have the password, please contact the hospital operator. Locate the [facility] provider you are looking for under [facility] and page to a number that you can be directly reached. If you still have difficulty reaching the provider, please page the [facility] (Director on Call) for the Hospitalists listed on amion for assistance.  09/01/2021, 4:45 PM   This document was prepared using Dragon voice recognition software and may contain some unintended transcription errors.

## 2021-09-02 ENCOUNTER — Other Ambulatory Visit: Payer: Self-pay

## 2021-09-02 ENCOUNTER — Inpatient Hospital Stay (HOSPITAL_COMMUNITY)
Admission: AD | Admit: 2021-09-02 | Discharge: 2021-09-09 | DRG: 885 | Disposition: A | Discharge status: Home / Self Care | Payer: Federal, State, Local not specified - Other | Source: Intra-hospital | Attending: Emergency Medicine | Admitting: Emergency Medicine

## 2021-09-02 ENCOUNTER — Encounter (HOSPITAL_COMMUNITY): Payer: Self-pay | Admitting: Emergency Medicine

## 2021-09-02 DIAGNOSIS — K219 Gastro-esophageal reflux disease without esophagitis: Secondary | ICD-10-CM | POA: Diagnosis present

## 2021-09-02 DIAGNOSIS — F333 Major depressive disorder, recurrent, severe with psychotic symptoms: Principal | ICD-10-CM | POA: Diagnosis present

## 2021-09-02 DIAGNOSIS — E039 Hypothyroidism, unspecified: Secondary | ICD-10-CM

## 2021-09-02 DIAGNOSIS — E1165 Type 2 diabetes mellitus with hyperglycemia: Secondary | ICD-10-CM | POA: Diagnosis present

## 2021-09-02 DIAGNOSIS — F332 Major depressive disorder, recurrent severe without psychotic features: Secondary | ICD-10-CM | POA: Diagnosis not present

## 2021-09-02 DIAGNOSIS — F6089 Other specific personality disorders: Secondary | ICD-10-CM | POA: Diagnosis present

## 2021-09-02 DIAGNOSIS — J45909 Unspecified asthma, uncomplicated: Secondary | ICD-10-CM

## 2021-09-02 DIAGNOSIS — Z9114 Patient's other noncompliance with medication regimen: Secondary | ICD-10-CM | POA: Diagnosis not present

## 2021-09-02 DIAGNOSIS — G4700 Insomnia, unspecified: Secondary | ICD-10-CM | POA: Diagnosis present

## 2021-09-02 DIAGNOSIS — Z818 Family history of other mental and behavioral disorders: Secondary | ICD-10-CM | POA: Diagnosis not present

## 2021-09-02 DIAGNOSIS — Z20822 Contact with and (suspected) exposure to covid-19: Secondary | ICD-10-CM | POA: Diagnosis present

## 2021-09-02 DIAGNOSIS — Z79899 Other long term (current) drug therapy: Secondary | ICD-10-CM

## 2021-09-02 DIAGNOSIS — T50902A Poisoning by unspecified drugs, medicaments and biological substances, intentional self-harm, initial encounter: Secondary | ICD-10-CM | POA: Diagnosis present

## 2021-09-02 DIAGNOSIS — F1220 Cannabis dependence, uncomplicated: Secondary | ICD-10-CM | POA: Diagnosis present

## 2021-09-02 LAB — COMPREHENSIVE METABOLIC PANEL
ALT: 40 U/L (ref 0–44)
AST: 33 U/L (ref 15–41)
Albumin: 3.3 g/dL — ABNORMAL LOW (ref 3.5–5.0)
Alkaline Phosphatase: 58 U/L (ref 38–126)
Anion gap: 3 — ABNORMAL LOW (ref 5–15)
BUN: 7 mg/dL (ref 6–20)
CO2: 23 mmol/L (ref 22–32)
Calcium: 8 mg/dL — ABNORMAL LOW (ref 8.9–10.3)
Chloride: 111 mmol/L (ref 98–111)
Creatinine, Ser: 0.72 mg/dL (ref 0.44–1.00)
GFR, Estimated: >60 mL/min (ref >60)
Glucose, Bld: 124 mg/dL — ABNORMAL HIGH (ref 70–99)
Potassium: 4 mmol/L (ref 3.5–5.1)
Sodium: 137 mmol/L (ref 135–145)
Total Bilirubin: 0.8 mg/dL (ref 0.3–1.2)
Total Protein: 5.7 g/dL — ABNORMAL LOW (ref 6.5–8.1)

## 2021-09-02 LAB — GLUCOSE, CAPILLARY
Glucose-Capillary: 147 mg/dL — ABNORMAL HIGH (ref 70–99)
Glucose-Capillary: 126 mg/dL — ABNORMAL HIGH (ref 70–99)

## 2021-09-02 LAB — CBC
HCT: 34.9 % — ABNORMAL LOW (ref 36.0–46.0)
Hemoglobin: 11.6 g/dL — ABNORMAL LOW (ref 12.0–15.0)
MCH: 31.5 pg (ref 26.0–34.0)
MCHC: 33.2 g/dL (ref 30.0–36.0)
MCV: 94.8 fL (ref 80.0–100.0)
Platelets: 184 10*3/uL (ref 150–400)
RBC: 3.68 MIL/uL — ABNORMAL LOW (ref 3.87–5.11)
RDW: 13.1 % (ref 11.5–15.5)
WBC: 5.2 10*3/uL (ref 4.0–10.5)
nRBC: 0 % (ref 0.0–0.2)

## 2021-09-02 LAB — HIV ANTIBODY (ROUTINE TESTING W REFLEX): HIV Screen 4th Generation wRfx: NONREACTIVE

## 2021-09-02 MED ORDER — MAGNESIUM HYDROXIDE 400 MG/5ML PO SUSP
30.0000 mL | Freq: Every day | ORAL | Status: DC | PRN
  Administered 2021-09-04 – 2021-09-06 (×3): 30 mL via ORAL
  Filled 2021-09-02 (×3): qty 30

## 2021-09-02 MED ORDER — DULOXETINE HCL 30 MG PO CPEP
30.0000 mg | ORAL_CAPSULE | Freq: Every day | ORAL | Status: DC
Start: 2021-09-02 — End: 2021-09-03
  Filled 2021-09-02 (×4): qty 1

## 2021-09-02 MED ORDER — ACETAMINOPHEN 325 MG PO TABS
650.0000 mg | ORAL_TABLET | Freq: Four times a day (QID) | ORAL | Status: DC | PRN
  Administered 2021-09-02 – 2021-09-06 (×5): 650 mg via ORAL
  Filled 2021-09-02 (×5): qty 2

## 2021-09-02 MED ORDER — INSULIN ASPART 100 UNIT/ML IJ SOLN: 0.0000 [IU] | Freq: Three times a day (TID) | INTRAMUSCULAR | Status: DC

## 2021-09-02 MED ORDER — PANTOPRAZOLE SODIUM 40 MG PO TBEC
40.0000 mg | DELAYED_RELEASE_TABLET | Freq: Every day | ORAL | Status: DC
  Administered 2021-09-02 – 2021-09-08 (×3): 40 mg via ORAL
  Filled 2021-09-02 (×11): qty 1

## 2021-09-02 MED ORDER — ALUM & MAG HYDROXIDE-SIMETH 200-200-20 MG/5ML PO SUSP
15.0000 mL | ORAL | Status: DC | PRN
  Administered 2021-09-08: 15 mL via ORAL
  Filled 2021-09-02: qty 30

## 2021-09-02 MED ORDER — LIP MEDEX EX OINT
TOPICAL_OINTMENT | CUTANEOUS | Status: DC | PRN
  Filled 2021-09-02: qty 7

## 2021-09-02 NOTE — [facility] Group Notes (Signed)
Patient did not attend the Psycho-Ed group. 

## 2021-09-02 NOTE — Progress Notes (Signed)
The patient attended group but she refused to share.

## 2021-09-02 NOTE — Progress Notes (Addendum)
PROGRESS NOTE  Erika Rhodes:[id number] DOB: 06-06-1981 DOA: 09/01/2021 PCP: Pcp, No   LOS: 0 days   Brief narrative: Erika Rhodes is a 40 y.o. female with medical history significant of allergic reaction, anxiety, depression, asthma exacerbation, gestational diabetes, migraine headaches, rheumatoid arthritis presented ED after intentional overdose.  In the ED, vitals were stable.  Urinalysis showed glycosuria.  Patient was noted to have mild hyponatremia and hypokalemia.  UDS was positive for THC.  Glucose level was elevated at 328.  AST and ALT was mildly elevated.  Patient had normal salicylate acetaminophen and alcohol level.  Patient was then admitted hospital for further evaluation and treatment.  Assessment/Plan:  Principal Problem:   Intentional overdose (HCC) Active Problems:   Asthma   Hypothyroidism   Hypokalemia   Type 2 diabetes mellitus with hyperglycemia (HCC)   Prolonged QT interval  Intentional overdose  Poison control involved.  Patient did express suicidal ideation.  Secondary school teacher at bedside.  Psychiatry has been consulted.  ALT, AST has trended down.  On as needed Ativan.  Hypokalemia Resolved after replacement.  On normal saline with potassium supplements     Prolonged QT interval Continue to monitor.  Avoid QT prolonging agents.  Received potassium and magnesium supplement.     Asthma Appears to be compensated.  Continue bronchodilators as needed.     Hypothyroidism Continue Synthroid.   Type 2 diabetes mellitus with hyperglycemia  Continue oral diet.  Metformin at home.  DVT prophylaxis: enoxaparin (LOVENOX) injection 40 mg Start: 09/01/21 1800  Code Status: Full code  Family Communication:  Spoke with the patient at bedside  Status is: Observation  The patient will require care spanning > 2 midnights and should be moved to inpatient because:psychiatric evaluation, possible need for psychiatric admission.  At this time patient is stable  from medical point of view for disposition to psychiatric facility   Consultants: Psychiatry  Procedures: None  Anti-infectives:  None  Anti-infectives (From admission, onward)    None       Subjective: Today, patient was seen and examined at bedside.  Patient denies any nausea vomiting or abdominal pain.  Denies any headache.  Denies active suicidal ideation at this time.  Patient does have some chronic pain at baseline.  Objective: Vitals:   09/02/21 0749 09/02/21 0800  BP:  126/82  Pulse:  84  Resp:  11  Temp: 98.7 F (37.1 C)   SpO2:  100%    Intake/Output Summary (Last 24 hours) at 09/02/2021 1033 Last data filed at 09/02/2021 6967 Gross per 24 hour  Intake 3885.44 ml  Output --  Net 3885.44 ml   Filed Weights   09/01/21 1801  Weight: 93.4 kg   Body mass index is 35.9 kg/m.   Physical Exam: GENERAL: Patient is alert awake and oriented. Not in obvious distress.  Obese. HENT: No scleral pallor or icterus. Pupils equally reactive to light. Oral mucosa is dry. NECK: is supple, no gross swelling noted. CHEST: Clear to auscultation. No crackles or wheezes.  Diminished breath sounds bilaterally. CVS: S1 and S2 heard, no murmur.   ABDOMEN: Soft, nonspecific tenderness on palpation, bowel sounds are present. EXTREMITIES: No edema. CNS: Cranial nerves are intact. No focal motor deficits. SKIN: warm and dry without rashes.  Data Review: I have personally reviewed the following laboratory data and studies,  CBC: Recent Labs  Lab 09/01/21 1330 09/02/21 0310  WBC 6.7 5.2  NEUTROABS 4.5  --   HGB 14.5 11.6*  HCT 41.0  34.9*  MCV 89.7 94.8  PLT 276 184   Basic Metabolic Panel: Recent Labs  Lab 09/01/21 1330 09/02/21 0310  NA 134* 137  K 3.0* 4.0  CL 101 111  CO2 18* 23  GLUCOSE 328* 124*  BUN 14 7  CREATININE 0.90 0.72  CALCIUM 9.2 8.0*  MG 1.8  --    Liver Function Tests: Recent Labs  Lab 09/01/21 1330 09/02/21 0310  AST 55* 33  ALT  54* 40  ALKPHOS 76 58  BILITOT 1.1 0.8  PROT 7.6 5.7*  ALBUMIN 4.4 3.3*   No results for input(s): LIPASE, AMYLASE in the last 168 hours. No results for input(s): AMMONIA in the last 168 hours. Cardiac Enzymes: No results for input(s): CKTOTAL, CKMB, CKMBINDEX, TROPONINI in the last 168 hours. BNP (last 3 results) No results for input(s): BNP in the last 8760 hours.  ProBNP (last 3 results) No results for input(s): PROBNP in the last 8760 hours.  CBG: Recent Labs  Lab 09/01/21 1727 09/01/21 2149 09/02/21 0740  GLUCAP 176* 143* 147*   Recent Results (from the past 240 hour(s))  Resp Panel by RT-PCR (Flu A&B, Covid) Nasopharyngeal Swab     Status: None   Collection Time: 09/01/21  1:49 PM   Specimen: Nasopharyngeal Swab; Nasopharyngeal(NP) swabs in vial transport medium  Result Value Ref Range Status   SARS Coronavirus 2 by RT PCR NEGATIVE NEGATIVE Final    Comment: (NOTE) SARS-CoV-2 target nucleic acids are NOT DETECTED.  The SARS-CoV-2 RNA is generally detectable in upper respiratory specimens during the acute phase of infection. The lowest concentration of SARS-CoV-2 viral copies this assay can detect is 138 copies/mL. A negative result does not preclude SARS-Cov-2 infection and should not be used as the sole basis for treatment or other patient management decisions. A negative result may occur with  improper specimen collection/handling, submission of specimen other than nasopharyngeal swab, presence of viral mutation(s) within the areas targeted by this assay, and inadequate number of viral copies(<138 copies/mL). A negative result must be combined with clinical observations, patient history, and epidemiological information. The expected result is Negative.  Fact Sheet for Patients:  BloggerCourse.com  Fact Sheet for Healthcare Providers:  SeriousBroker.it  This test is no t yet approved or cleared by the [place] FDA and  has been authorized for detection and/or diagnosis of SARS-CoV-2 by FDA under an Emergency Use Authorization (EUA). This EUA will remain  in effect (meaning this test can be used) for the duration of the COVID-19 declaration under Section 564(b)(1) of the Act, 21 U.S.C.section 360bbb-3(b)(1), unless the authorization is terminated  or revoked sooner.       Influenza A by PCR NEGATIVE NEGATIVE Final   Influenza B by PCR NEGATIVE NEGATIVE Final    Comment: (NOTE) The Xpert Xpress SARS-CoV-2/FLU/RSV plus assay is intended as an aid in the diagnosis of influenza from Nasopharyngeal swab specimens and should not be used as a sole basis for treatment. Nasal washings and aspirates are unacceptable for Xpert Xpress SARS-CoV-2/FLU/RSV testing.  Fact Sheet for Patients: BloggerCourse.com  Fact Sheet for Healthcare Providers: SeriousBroker.it  This test is not yet approved or cleared by the [place] FDA and has been authorized for detection and/or diagnosis of SARS-CoV-2 by FDA under an Emergency Use Authorization (EUA). This EUA will remain in effect (meaning this test can be used) for the duration of the COVID-19 declaration under Section 564(b)(1) of the Act, 21 U.S.C. section 360bbb-3(b)(1), unless the authorization is terminated  or revoked.  Performed at [facility], [address] [place], [place] [address]   MRSA Next Gen by PCR, Nasal     Status: None   Collection Time: 09/01/21  4:51 PM   Specimen: Nasal Mucosa; Nasal Swab  Result Value Ref Range Status   MRSA by PCR Next Gen NOT DETECTED NOT DETECTED Final    Comment: (NOTE) The GeneXpert MRSA Assay (FDA approved for NASAL specimens only), is one component of a comprehensive MRSA colonization surveillance program. It is not intended to diagnose MRSA infection nor to guide or monitor treatment for MRSA infections. Test performance is  not FDA approved in patients less than 88 years old. Performed at [facility], [address], [place], [place] [address]      Studies: No results found.   Joycelyn Das, MD  [facility] 09/02/2021  If 7PM-7AM, please contact night-coverage

## 2021-09-02 NOTE — Progress Notes (Signed)
Inpatient Diabetes Program Recommendations  AACE/ADA: New Consensus Statement on Inpatient Glycemic Control (2015)  Target Ranges:  Prepandial:   less than 140 mg/dL      Peak postprandial:   less than 180 mg/dL (1-2 hours)      Critically ill patients:  140 - 180 mg/dL   Lab Results  Component Value Date   GLUCAP 147 (H) 09/02/2021   HGBA1C 8.1 (H) 05/31/2021    Review of Glycemic Control  Diabetes history: Gestational Diabetes Outpatient Diabetes medications: none Current orders for Inpatient glycemic control: Novolog 0-20 units tid  Glucose trends 143-176.  Note: Do not see an official diagnosis of Type 2 DM. This maybe new to pt. Based on glucose trends will need orals at time of d/c. Need to make sure pt has glucose meter for home use.  Will see pt when appropriate.  Thanks,  Christena Deem RN, MSN, BC-ADM Inpatient Diabetes Coordinator Team Pager [phone] (8a-5p)

## 2021-09-02 NOTE — Tx Team (Signed)
Initial Treatment Plan 09/02/2021 6:13 PM [name] UUE:[id number]    PATIENT STRESSORS: Loss of custody of children   Marital or family conflict     PATIENT STRENGTHS: Forensic psychologist fund of knowledge  Physical Health  Supportive family/friends    PATIENT IDENTIFIED PROBLEMS: depression  anxiety  Ineffective coping  Suicidal ideation               DISCHARGE CRITERIA:  Improved stabilization in mood, thinking, and/or behavior Motivation to continue treatment in a less acute level of care Need for constant or close observation no longer present Reduction of life-threatening or endangering symptoms to within safe limits Verbal commitment to aftercare and medication compliance  PRELIMINARY DISCHARGE PLAN: Outpatient therapy Return to previous living arrangement  PATIENT/FAMILY INVOLVEMENT: This treatment plan has been presented to and reviewed with the patient, Erika Rhodes, and/or family member.  The patient and family have been given the opportunity to ask questions and make suggestions.  Hoover Browns, RN 09/02/2021, 6:13 PM

## 2021-09-02 NOTE — Progress Notes (Signed)
IV access removed, patient wheeled downstairs via wheelchair by nursing staff. AVS paperwork given
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
## 2021-09-02 NOTE — TOC Transition Note (Signed)
Transition of Care [facility]) - CM/SW Discharge Note   Patient Details  Name: Erika Rhodes MRN: [id number] Date of Birth: [birth year]  Transition [facility]) CM/SW Contact:  Ida Rogue, [name] Phone Number: 09/02/2021, 3:56 PM   Clinical Narrative:   CSW was contacted by Malva Limes at [facility] asking for help with having patient sign in voluntarily [she had been IVC'd yesterday] and setting up transportation to [facility] via [facility] transport.  I spoke to Ms Mcroy, who signed voluntary paperwork while disagreeing with the "voluntary" status when I told her we would keep her IVC'd and ask police to transport if she decided not to sign. I also had MD sign IVC rescind paperwork and arranged for it to be sent to magistrate. Finally, I gave the number for transport to RN so that she could call when she was ready for patient to be transported. No further needs identified.  TOC sign off.    Final next level of care: [facility] Hospital Barriers to Discharge: No Barriers Identified   Patient Goals and CMS Choice        Discharge Placement                       Discharge Plan and Services                                     Social Determinants of Health (SDOH) Interventions     Readmission Risk Interventions No flowsheet data found.
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
## 2021-09-02 NOTE — Progress Notes (Signed)
Report called to RN Lanora Manis at [facility] [phone]. [employer] called for transportation [phone], stated could be a 45 minute wait. Driver to call this RN when arrives at main entrance. Pt signed voluntary form with SW- per SW, IVC status is rescinded. Will continue to monitor and await transport.

## 2021-09-02 NOTE — Progress Notes (Signed)
This RN spoke with Kathlene November from [employer]. Updated on all of patient's care. Patient remains A&O, VSS, has not disclosed to this RN any further information regarding what medications she took at home. Will continue to monitor.

## 2021-09-02 NOTE — Discharge Summary (Signed)
Physician Discharge Summary  Erika Rhodes KTG:[id number] DOB: 1981/03/15 DOA: 09/01/2021  PCP: Pcp, No  Admit date: 09/01/2021 Discharge date: 09/02/2021  Admitted From: Home  Discharge disposition: Inpatient psychiatric facility  Recommendations for Outpatient Follow-Up:   Follow up with your primary care provider in one week after discharge.  Check CBC, BMP, magnesium in the next visit  Discharge Diagnosis:   Principal Problem:   Intentional overdose (HCC) Active Problems:   Asthma   Hypothyroidism   Hypokalemia   Type 2 diabetes mellitus with hyperglycemia (HCC)   Prolonged QT interval   Discharge Condition: Improved.  Diet recommendation: Carbohydrate-modified.    Wound care: None.  Code status: Full.  History of Present Illness:   Erika Rhodes is a 40 y.o. female with medical history significant of allergic reaction, anxiety, depression, asthma exacerbation, gestational diabetes, migraine headaches, rheumatoid arthritis presented ED after intentional overdose.  In the ED, vitals were stable.  Urinalysis showed glycosuria.  Patient was noted to have mild hyponatremia and hypokalemia.  UDS was positive for THC.  Glucose level was elevated at 328.  AST and ALT was mildly elevated.  Patient had normal salicylate, acetaminophen and alcohol level.  Patient was then admitted hospital for further evaluation and treatment.  Hospital Course:   Following conditions were addressed during hospitalization as listed below,  Intentional overdose  Poison control was involved.  Patient did express suicidal ideation on presentation.  Secondary school teacher at bedside.  Psychiatry has been consulted and recommended inpatient admission..  ALT, AST has trended down.  Patient is alert awake oriented and medically stable at this time.   Hypokalemia Resolved after replacement.      Prolonged QT interval  Received potassium and magnesium supplement.     Asthma Appears to be  compensated.       Hypothyroidism Continue Synthroid.    Type 2 diabetes mellitus with hyperglycemia  Continue oral diet.  Metformin at home.  Will be resumed on discharge.  Disposition.  At this time, patient is stable for disposition to inpatient psych facility.  Medical Consultants:   Psychiatry  Procedures:    None Subjective:  Today, patient was seen and examined at bedside.  Patient denies any nausea vomiting or abdominal pain.  Denies any headache.  Denies active suicidal ideation at this time.  Patient does have some chronic pain at baseline.  Discharge Exam:   Vitals:   09/02/21 0800 09/02/21 1100  BP: 126/82 119/86  Pulse: 84 81  Resp: 11 14  Temp:  97.9 F (36.6 C)  SpO2: 100% 97%   Vitals:   09/02/21 0600 09/02/21 0749 09/02/21 0800 09/02/21 1100  BP: 133/83  126/82 119/86  Pulse: 74  84 81  Resp: (!) 24  11 14   Temp:  98.7 F (37.1 C)  97.9 F (36.6 C)  TempSrc:  Oral  Oral  SpO2: 100%  100% 97%  Weight:      Height:        General: Alert awake, not in obvious distress, obese HENT: pupils equally reacting to light,  No scleral pallor or icterus noted. Oral mucosa is moist.  Chest:  Clear breath sounds.  Diminished breath sounds bilaterally. No crackles or wheezes.  CVS: S1 &S2 heard. No murmur.  Regular rate and rhythm. Abdomen: Soft, nontender, nondistended.  Bowel sounds are heard.   Extremities: No cyanosis, clubbing or edema.  Peripheral pulses are palpable. Psych: Alert, awake and oriented, normal mood CNS:  No cranial nerve deficits.  Power  equal in all extremities.   Skin: Warm and dry.  No rashes noted.  The results of significant diagnostics from this hospitalization (including imaging, microbiology, ancillary and laboratory) are listed below for reference.     Diagnostic Studies:   No results found.   Labs:   Basic Metabolic Panel: Recent Labs  Lab 09/01/21 1330 09/02/21 0310  NA 134* 137  K 3.0* 4.0  CL 101 111  CO2 18*  23  GLUCOSE 328* 124*  BUN 14 7  CREATININE 0.90 0.72  CALCIUM 9.2 8.0*  MG 1.8  --    GFR Estimated Creatinine Clearance: 104.6 mL/min (by C-G formula based on SCr of 0.72 mg/dL). Liver Function Tests: Recent Labs  Lab 09/01/21 1330 09/02/21 0310  AST 55* 33  ALT 54* 40  ALKPHOS 76 58  BILITOT 1.1 0.8  PROT 7.6 5.7*  ALBUMIN 4.4 3.3*   No results for input(s): LIPASE, AMYLASE in the last 168 hours. No results for input(s): AMMONIA in the last 168 hours. Coagulation profile No results for input(s): INR, PROTIME in the last 168 hours.  CBC: Recent Labs  Lab 09/01/21 1330 09/02/21 0310  WBC 6.7 5.2  NEUTROABS 4.5  --   HGB 14.5 11.6*  HCT 41.0 34.9*  MCV 89.7 94.8  PLT 276 184   Cardiac Enzymes: No results for input(s): CKTOTAL, CKMB, CKMBINDEX, TROPONINI in the last 168 hours. BNP: Invalid input(s): POCBNP CBG: Recent Labs  Lab 09/01/21 1727 09/01/21 2149 09/02/21 0740 09/02/21 1131  GLUCAP 176* 143* 147* 126*   D-Dimer No results for input(s): DDIMER in the last 72 hours. Hgb A1c No results for input(s): HGBA1C in the last 72 hours. Lipid Profile No results for input(s): CHOL, HDL, LDLCALC, TRIG, CHOLHDL, LDLDIRECT in the last 72 hours. Thyroid function studies No results for input(s): TSH, T4TOTAL, T3FREE, THYROIDAB in the last 72 hours.  Invalid input(s): FREET3 Anemia work up No results for input(s): VITAMINB12, FOLATE, FERRITIN, TIBC, IRON, RETICCTPCT in the last 72 hours. Microbiology Recent Results (from the past 240 hour(s))  Resp Panel by RT-PCR (Flu A&B, Covid) Nasopharyngeal Swab     Status: None   Collection Time: 09/01/21  1:49 PM   Specimen: Nasopharyngeal Swab; Nasopharyngeal(NP) swabs in vial transport medium  Result Value Ref Range Status   SARS Coronavirus 2 by RT PCR NEGATIVE NEGATIVE Final    Comment: (NOTE) SARS-CoV-2 target nucleic acids are NOT DETECTED.  The SARS-CoV-2 RNA is generally detectable in upper  respiratory specimens during the acute phase of infection. The lowest concentration of SARS-CoV-2 viral copies this assay can detect is 138 copies/mL. A negative result does not preclude SARS-Cov-2 infection and should not be used as the sole basis for treatment or other patient management decisions. A negative result may occur with  improper specimen collection/handling, submission of specimen other than nasopharyngeal swab, presence of viral mutation(s) within the areas targeted by this assay, and inadequate number of viral copies(<138 copies/mL). A negative result must be combined with clinical observations, patient history, and epidemiological information. The expected result is Negative.  Fact Sheet for Patients:  BloggerCourse.com  Fact Sheet for Healthcare Providers:  SeriousBroker.it  This test is no t yet approved or cleared by the [place] FDA and  has been authorized for detection and/or diagnosis of SARS-CoV-2 by FDA under an Emergency Use Authorization (EUA). This EUA will remain  in effect (meaning this test can be used) for the duration of the COVID-19 declaration under Section 564(b)(1) of the  Act, 21 U.S.C.section 360bbb-3(b)(1), unless the authorization is terminated  or revoked sooner.       Influenza A by PCR NEGATIVE NEGATIVE Final   Influenza B by PCR NEGATIVE NEGATIVE Final    Comment: (NOTE) The Xpert Xpress SARS-CoV-2/FLU/RSV plus assay is intended as an aid in the diagnosis of influenza from Nasopharyngeal swab specimens and should not be used as a sole basis for treatment. Nasal washings and aspirates are unacceptable for Xpert Xpress SARS-CoV-2/FLU/RSV testing.  Fact Sheet for Patients: BloggerCourse.com  Fact Sheet for Healthcare Providers: SeriousBroker.it  This test is not yet approved or cleared by the [place] FDA and has been  authorized for detection and/or diagnosis of SARS-CoV-2 by FDA under an Emergency Use Authorization (EUA). This EUA will remain in effect (meaning this test can be used) for the duration of the COVID-19 declaration under Section 564(b)(1) of the Act, 21 U.S.C. section 360bbb-3(b)(1), unless the authorization is terminated or revoked.  Performed at [facility], [address], [place], [place] [address]   MRSA Next Gen by PCR, Nasal     Status: None   Collection Time: 09/01/21  4:51 PM   Specimen: Nasal Mucosa; Nasal Swab  Result Value Ref Range Status   MRSA by PCR Next Gen NOT DETECTED NOT DETECTED Final    Comment: (NOTE) The GeneXpert MRSA Assay (FDA approved for NASAL specimens only), is one component of a comprehensive MRSA colonization surveillance program. It is not intended to diagnose MRSA infection nor to guide or monitor treatment for MRSA infections. Test performance is not FDA approved in patients less than 7 years old. Performed at [facility], [address], [place], [place] [address]      Discharge Instructions:   Discharge Instructions     Diet Carb Modified   Complete by: As directed    Discharge instructions   Complete by: As directed    Follow-up with your primary care physician in 1 week after discharge.   Increase activity slowly   Complete by: As directed       Allergies as of 09/02/2021       Reactions   Azithromycin Anaphylaxis        Medication List     TAKE these medications    albuterol 108 (90 Base) MCG/ACT inhaler Commonly known as: VENTOLIN HFA Inhale 1-2 puffs into the lungs every 4 (four) hours as needed for wheezing or shortness of breath.   clindamycin 1 % gel Commonly known as: CLINDAGEL Apply 1 application topically 2 (two) times daily.   clindamycin-benzoyl peroxide gel Commonly known as: BENZACLIN Apply 1 application topically 2 (two) times daily.   diphenhydrAMINE 25 MG  tablet Commonly known as: BENADRYL Take 25 mg by mouth every 6 (six) hours as needed for allergies.   doxycycline 100 MG tablet Commonly known as: VIBRA-TABS Take 100 mg by mouth 2 (two) times daily.   DULoxetine 30 MG capsule Commonly known as: CYMBALTA Take 1 capsule (30 mg total) by mouth daily.   levothyroxine 50 MCG tablet Commonly known as: SYNTHROID Take 1 tablet (50 mcg total) by mouth daily at 6 (six) AM.   MELATONIN PO Take 2 tablets by mouth at bedtime as needed (sleep).   metformin 1000 MG (OSM) 24 hr tablet Commonly known as: FORTAMET Take 2 tablets (2,000 mg total) by mouth daily with supper.   metFORMIN 1000 MG tablet Commonly known as: GLUCOPHAGE Take 1,000 mg by mouth 2 (two) times daily.  naproxen sodium 220 MG tablet Commonly known as: ALEVE Take 440 mg by mouth 2 (two) times daily as needed (pain/headache/pain).   Nasal Spray 12 Hour 0.05 % nasal spray Generic drug: oxymetazoline Place 1 spray into both nostrils 2 (two) times daily as needed for congestion.   pantoprazole 40 MG tablet Commonly known as: PROTONIX Take 1 tablet (40 mg total) by mouth daily.   QUEtiapine 400 MG tablet Commonly known as: SEROQUEL Take 1 tablet (400 mg total) by mouth at bedtime.   SUMAtriptan 50 MG tablet Commonly known as: IMITREX Take 1 tablet (50 mg total) by mouth every 2 (two) hours as needed for migraine or headache. May repeat in 2 hours if headache persists or recurs.   tretinoin 0.025 % cream Commonly known as: RETIN-A Apply 1 application topically at bedtime.         Time coordinating discharge: 39 minutes  Signed:  Eli Pattillo  [facility] 09/02/2021, 12:54 PM

## 2021-09-03 ENCOUNTER — Encounter (HOSPITAL_COMMUNITY): Payer: Self-pay

## 2021-09-03 DIAGNOSIS — K219 Gastro-esophageal reflux disease without esophagitis: Secondary | ICD-10-CM | POA: Diagnosis present

## 2021-09-03 LAB — COMPREHENSIVE METABOLIC PANEL
ALT: 49 U/L — ABNORMAL HIGH (ref 0–44)
AST: 43 U/L — ABNORMAL HIGH (ref 15–41)
Albumin: 4.3 g/dL (ref 3.5–5.0)
Alkaline Phosphatase: 72 U/L (ref 38–126)
Anion gap: 9 (ref 5–15)
BUN: 6 mg/dL (ref 6–20)
CO2: 22 mmol/L (ref 22–32)
Calcium: 9.2 mg/dL (ref 8.9–10.3)
Chloride: 103 mmol/L (ref 98–111)
Creatinine, Ser: 0.8 mg/dL (ref 0.44–1.00)
GFR, Estimated: >60 mL/min (ref >60)
Glucose, Bld: 152 mg/dL — ABNORMAL HIGH (ref 70–99)
Potassium: 3.8 mmol/L (ref 3.5–5.1)
Sodium: 134 mmol/L — ABNORMAL LOW (ref 135–145)
Total Bilirubin: 0.8 mg/dL (ref 0.3–1.2)
Total Protein: 7.5 g/dL (ref 6.5–8.1)

## 2021-09-03 LAB — LIPID PANEL
Cholesterol: 204 mg/dL — ABNORMAL HIGH (ref 0–200)
HDL: 41 mg/dL (ref >40)
LDL Cholesterol: 130 mg/dL — ABNORMAL HIGH (ref 0–99)
Total CHOL/HDL Ratio: 5 RATIO
Triglycerides: 165 mg/dL — ABNORMAL HIGH (ref <150)
VLDL: 33 mg/dL (ref 0–40)

## 2021-09-03 LAB — CBC
HCT: 43.2 % (ref 36.0–46.0)
Hemoglobin: 14.8 g/dL (ref 12.0–15.0)
MCH: 32.2 pg (ref 26.0–34.0)
MCHC: 34.3 g/dL (ref 30.0–36.0)
MCV: 93.9 fL (ref 80.0–100.0)
Platelets: 299 10*3/uL (ref 150–400)
RBC: 4.6 MIL/uL (ref 3.87–5.11)
RDW: 13.2 % (ref 11.5–15.5)
WBC: 6.4 10*3/uL (ref 4.0–10.5)
nRBC: 0 % (ref 0.0–0.2)

## 2021-09-03 LAB — GLUCOSE, CAPILLARY: Glucose-Capillary: 160 mg/dL — ABNORMAL HIGH (ref 70–99)

## 2021-09-03 LAB — HEMOGLOBIN A1C
Hgb A1c MFr Bld: 7.7 % — ABNORMAL HIGH (ref 4.8–5.6)
Mean Plasma Glucose: 174.29 mg/dL

## 2021-09-03 LAB — TSH: TSH: 6.299 u[IU]/mL — ABNORMAL HIGH (ref 0.350–4.500)

## 2021-09-03 NOTE — [facility] Counselor (Signed)
CSW attempted to complete the initial assessment with the Pt.  Pt stated "I don't feel like talking right now".  The Pt asked the CSW to come back tomorrow (09/04/2021) to complete the assessment.  CSW will attempt the assessment again tomorrow.
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
## 2021-09-03 NOTE — Progress Notes (Signed)
   09/03/21 1400  Psych Admission Type (Psych Patients Only)  Admission Status Voluntary  Psychosocial Assessment  Patient Complaints Worrying  Eye Contact Brief  Facial Expression Other (Comment) (appropriate to circumstance)  Affect Blunted  Speech Logical/coherent  Interaction Assertive  Motor Activity Other (Comment) (WDL)  Appearance/Hygiene Disheveled  Behavior Characteristics Unwilling to participate  Mood Depressed  Thought Process  Coherency WDL  Content WDL  Delusions None reported or observed  Perception WDL  Judgment Impaired  Confusion WDL  Danger to Self  Current suicidal ideation? Denies  Danger to Others  Danger to Others None reported or observed  Dar Note: Patient presents with irritable affect and mood.  Refused medications, CBG, EKG and blood work.  Routine safety checks maintained.  Patient is safe on and off the unit.

## 2021-09-03 NOTE — BH IP Treatment Plan (Signed)
Interdisciplinary Treatment and Diagnostic Plan Update  09/03/2021 Time of Session: 9:50am  Miel Wisener MRN: [id number]  Principal Diagnosis: MDD (major depressive disorder), recurrent episode, severe ([place])  Secondary Diagnoses: Principal Problem:   MDD (major depressive disorder), recurrent episode, severe ([place]) Active Problems:   Intentional overdose ([place])   Type 2 diabetes mellitus with hyperglycemia ([place])   GERD (gastroesophageal reflux disease)   Current Medications:  Current Facility-Administered Medications  Medication Dose Route Frequency Provider Last Rate Last Admin   acetaminophen (TYLENOL) tablet 650 mg  650 mg Oral Q6H PRN Suella Broad, FNP   650 mg at 09/02/21 2132   alum & mag hydroxide-simeth (MAALOX/MYLANTA) 200-200-20 MG/5ML suspension 15 mL  15 mL Oral Q4H PRN Starkes-Perry, Gayland Curry, FNP       insulin aspart (novoLOG) injection 0-20 Units  0-20 Units Subcutaneous TID WC Starkes-Perry, Gayland Curry, FNP       lip balm (CARMEX) ointment   Topical PRN Starkes-Perry, Gayland Curry, FNP       magnesium hydroxide (MILK OF MAGNESIA) suspension 30 mL  30 mL Oral Daily PRN Starkes-Perry, Gayland Curry, FNP       pantoprazole (PROTONIX) EC tablet 40 mg  40 mg Oral Daily Suella Broad, FNP   40 mg at 09/02/21 2132   PTA Medications: Medications Prior to Admission  Medication Sig Dispense Refill Last Dose   albuterol (VENTOLIN HFA) 108 (90 Base) MCG/ACT inhaler Inhale 1-2 puffs into the lungs every 4 (four) hours as needed for wheezing or shortness of breath. 1 each 0    clindamycin (CLINDAGEL) 1 % gel Apply 1 application topically 2 (two) times daily.      clindamycin-benzoyl peroxide (BENZACLIN) gel Apply 1 application topically 2 (two) times daily.      diphenhydrAMINE (BENADRYL) 25 MG tablet Take 25 mg by mouth every 6 (six) hours as needed for allergies.      doxycycline (VIBRA-TABS) 100 MG tablet Take 100 mg by mouth 2 (two) times daily.      DULoxetine (CYMBALTA)  30 MG capsule Take 1 capsule (30 mg total) by mouth daily. (Patient not taking: No sig reported) 30 capsule 0    levothyroxine (SYNTHROID) 50 MCG tablet Take 1 tablet (50 mcg total) by mouth daily at 6 (six) AM. (Patient not taking: Reported on 09/01/2021) 30 tablet 0    MELATONIN PO Take 2 tablets by mouth at bedtime as needed (sleep).      metFORMIN (FORTAMET) 1000 MG (OSM) 24 hr tablet Take 2 tablets (2,000 mg total) by mouth daily with supper. (Patient not taking: No sig reported) 30 tablet 0    metFORMIN (GLUCOPHAGE) 1000 MG tablet Take 1,000 mg by mouth 2 (two) times daily. (Patient not taking: No sig reported)      naproxen sodium (ALEVE) 220 MG tablet Take 440 mg by mouth 2 (two) times daily as needed (pain/headache/pain).      oxymetazoline (NASAL SPRAY 12 HOUR) 0.05 % nasal spray Place 1 spray into both nostrils 2 (two) times daily as needed for congestion.      pantoprazole (PROTONIX) 40 MG tablet Take 1 tablet (40 mg total) by mouth daily. 30 tablet 0    QUEtiapine (SEROQUEL) 400 MG tablet Take 1 tablet (400 mg total) by mouth at bedtime. (Patient not taking: No sig reported) 30 tablet 0    SUMAtriptan (IMITREX) 50 MG tablet Take 1 tablet (50 mg total) by mouth every 2 (two) hours as needed for migraine or headache. May repeat  in 2 hours if headache persists or recurs. 10 tablet 0    tretinoin (RETIN-A) 0.025 % cream Apply 1 application topically at bedtime.       Patient Stressors: Loss of custody of children   Marital or family conflict    Patient Strengths: Marketing executive fund of knowledge  Physical Health  Supportive family/friends   Treatment Modalities: Medication Management, Group therapy, Case management,  1 to 1 session with clinician, Psychoeducation, Recreational therapy.   Physician Treatment Plan for Primary Diagnosis: MDD (major depressive disorder), recurrent episode, severe ([place]) Long Term Goal(s): Improvement in symptoms so as ready for discharge    Short Term Goals: Ability to identify changes in lifestyle to reduce recurrence of condition will improve Ability to verbalize feelings will improve Ability to disclose and discuss suicidal ideas Ability to demonstrate self-control will improve Ability to identify and develop effective coping behaviors will improve Compliance with prescribed medications will improve  Medication Management: Evaluate patient's response, side effects, and tolerance of medication regimen.  Therapeutic Interventions: 1 to 1 sessions, Unit Group sessions and Medication administration.  Evaluation of Outcomes: Not Met  Physician Treatment Plan for Secondary Diagnosis: Principal Problem:   MDD (major depressive disorder), recurrent episode, severe ([place]) Active Problems:   Intentional overdose ([place])   Type 2 diabetes mellitus with hyperglycemia ([employer])   GERD (gastroesophageal reflux disease)  Long Term Goal(s): Improvement in symptoms so as ready for discharge   Short Term Goals: Ability to identify changes in lifestyle to reduce recurrence of condition will improve Ability to verbalize feelings will improve Ability to disclose and discuss suicidal ideas Ability to demonstrate self-control will improve Ability to identify and develop effective coping behaviors will improve Compliance with prescribed medications will improve     Medication Management: Evaluate patient's response, side effects, and tolerance of medication regimen.  Therapeutic Interventions: 1 to 1 sessions, Unit Group sessions and Medication administration.  Evaluation of Outcomes: Not Met   RN Treatment Plan for Primary Diagnosis: MDD (major depressive disorder), recurrent episode, severe ([place]) Long Term Goal(s): Knowledge of disease and therapeutic regimen to maintain health will improve  Short Term Goals: Ability to remain free from injury will improve, Ability to demonstrate self-control, Ability to participate in decision making  will improve, Ability to verbalize feelings will improve, Ability to disclose and discuss suicidal ideas, and Ability to identify and develop effective coping behaviors will improve  Medication Management: RN will administer medications as ordered by provider, will assess and evaluate patient's response and provide education to patient for prescribed medication. RN will report any adverse and/or side effects to prescribing provider.  Therapeutic Interventions: 1 on 1 counseling sessions, Psychoeducation, Medication administration, Evaluate responses to treatment, Monitor vital signs and CBGs as ordered, Perform/monitor CIWA, COWS, AIMS and Fall Risk screenings as ordered, Perform wound care treatments as ordered.  Evaluation of Outcomes: Not Met   [name] Treatment Plan for Primary Diagnosis: MDD (major depressive disorder), recurrent episode, severe ([place]) Long Term Goal(s): Safe transition to appropriate next level of care at discharge, Engage patient in therapeutic group addressing interpersonal concerns.  Short Term Goals: Engage patient in aftercare planning with referrals and resources, Increase social support, Increase emotional regulation, Facilitate acceptance of mental health diagnosis and concerns, Identify triggers associated with mental health/substance abuse issues, and Increase skills for wellness and recovery  Therapeutic Interventions: Assess for all discharge needs, 1 to 1 time with Social worker, Explore available resources and support systems, Assess for adequacy in community  support network, Educate family and significant other(s) on suicide prevention, Complete Psychosocial Assessment, Interpersonal group therapy.  Evaluation of Outcomes: Not Met   Progress in Treatment: Attending groups: No. Participating in groups: No. Taking medication as prescribed: Yes. Toleration medication: Yes. Family/Significant other contact made: Yes, individual(s) contacted:  If consents are  provided  Patient understands diagnosis: No. Discussing patient identified problems/goals with staff: Yes. Medical problems stabilized or resolved: Yes. Denies suicidal/homicidal ideation: Yes. Issues/concerns per patient self-inventory: No.   New problem(s) identified: No, Describe:  None   New Short Term/Long Term Goal(s): medication stabilization, elimination of SI thoughts, development of comprehensive mental wellness plan.   Patient Goals: Did not attend   Discharge Plan or Barriers: Patient recently admitted. CSW will continue to follow and assess for appropriate referrals and possible discharge planning.   Reason for Continuation of Hospitalization: Anxiety Depression Medication stabilization Suicidal ideation  Estimated Length of Stay: 3 to 5 days    Scribe for Treatment Team: Darleen Crocker, Latanya Presser 09/03/2021 3:14 PM

## 2021-09-03 NOTE — Progress Notes (Signed)
The patient attended group but refused to share.  

## 2021-09-03 NOTE — Group Note (Signed)
[name] Group Therapy Note   Group Date: 09/03/2021 Start Time: 1300 End Time: 1330   Type of Therapy and Topic:  Group Therapy:   Therapy Type: Group Therapy  Participation Level:  Did Not Attend   Patients received a worksheet with an outline of 2 gingerbread men with a separation in the middle of the page. One sign designated what the pt sees about themselves and the other is what others see. Pts were asked to introduce themselves and share something they like about themself. Pts were then asked to draw, write or color how they view themselves as well as how they are viewed by others. CSW led discussion about the feelings and words associated with each side.   Patient Summary:   Pt did not attend.   Felizardo Hoffmann, LCSWA 09/03/2021  1:41 PM

## 2021-09-03 NOTE — Progress Notes (Signed)
Pt refused to let MHT get vital signs.

## 2021-09-03 NOTE — Group Note (Signed)
Recreation Therapy Group Note   Group Topic:Stress Management  Group Date: 09/03/2021 Start Time: 0935 End Time: 0953 Facilitators: Caroll Rancher, LRT/CTRS Location: [address] Dayroom   Goal Area(s) Addresses:  Patient will actively participate in stress management techniques presented during session.  Patient will successfully identify benefit of practicing stress management post d/c.    Group Description: Guided Imagery. LRT provided education, instruction, and demonstration on practice of visualization via guided imagery. Patient was asked to participate in the technique introduced during session. Patients were given suggestions of ways to access scripts post d/c and encouraged to explore Youtube and other apps available on smartphones, tablets, and computers.   Affect/Mood: N/A   Participation Level: Did not attend    Clinical Observations/Individualized Feedback: Pt did not attend group.    Plan: Continue to engage patient in RT group sessions 2-3x/week.   Caroll Rancher, LRT/CTRS 09/03/2021 12:27 PM

## 2021-09-03 NOTE — Progress Notes (Signed)
Results for DANYIA, BORUNDA (MRN [id number]) as of 09/03/2021 06:27  Ref. Range 09/02/2021 07:40 09/02/2021 10:53 09/02/2021 11:31 09/03/2021 05:56  Glucose-Capillary Latest Ref Range: 70 - 99 mg/dL 706 (H)  237 (H) 628 (H)  EKG Unknown  Attch     Patient alert and oriented x 3 refused, insulin Novolog 4 units. Patient educated stated " I do not take insulin" Support and encouragement provided as needed.

## 2021-09-03 NOTE — Progress Notes (Signed)
Patient refused cymbalta and blood glucose check this evening she is irritable. Denies SI/HI/A/VH at present, her mood is depressed and flat affect.  C/O Headache, PRN tylenol 650 mg PO given at 2132. Patient educated on DM not receptive. No s/s of distress. Support and encouragement provided.
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
## 2021-09-04 NOTE — Progress Notes (Signed)
Patient refused HS accu check stated that " There is nothing wrong with me I do not need anything I am not diabetic" Support and encouragement provided. Patient stayed in the dayroom this evening with minimal interaction.

## 2021-09-04 NOTE — Progress Notes (Signed)
Patient refused EKG, Lab, and AM blood glucose check. Stated she had " nightmares due to the movie I watched last night, this is my third time here I will not take any medication I do not like a Female Doctor, I want to be transferred to another hospital, and when I get discharged I will get a Lawyer because I am not getting the help that I need" Support and encouragement Provided. Patient remains safe.
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
## 2021-09-04 NOTE — BHH Counselor (Addendum)
CSW spoke with the Pt, along with resident [facility].  The Pt refused her EKG, her Insulin, and all further labs.  The Pt states that she is a Vegan and needs doctor's orders to have better food served to her at the hospital.  The Pt continues to refuse all medications and follow up after care.  The Pt signed her 72-hour form this morning at 09/04/2021 at 10:10am.  Resident Cosby explained to the Pt that she would not be discharged today and would need to be observed for at least 2 days to see if her mood was stable on its own or if the doctor felt that she needed medications to help stabilize her mood.  The Pt agreed to this.  The 72- hour form was explained to the Pt again.  The Pt thanked the resident and CSW and left the room.

## 2021-09-04 NOTE — BHH Group Notes (Signed)
Adult Psychoeducational Group Note  Date:  09/04/2021 Time:  2:44 PM  Group Topic/Focus:  Wellness Toolbox:   The focus of this group is to discuss various aspects of wellness, balancing those aspects and exploring ways to increase the ability to experience wellness.  Patients will create a wellness toolbox for use upon discharge.  Participation Level:  Active  Participation Quality:  Appropriate  Affect:  Appropriate  Cognitive:  Appropriate  Insight: Appropriate  Engagement in Group:  Engaged  Modes of Intervention:  Activity  Additional Comments:  Patient attended and participated in the relaxation activity.  Erika Rhodes 09/04/2021, 2:44 PM

## 2021-09-04 NOTE — Progress Notes (Signed)
Psychoeducational Group Note  Date:  09/04/2021 Time:  2015  Group Topic/Focus:  Wrap up group  Participation Level: Did Not Attend  Participation Quality:  Not Applicable  Affect:  Not Applicable  Cognitive:  Not Applicable  Insight:  Not Applicable  Engagement in Group: Not Applicable  Additional Comments:  Did not attend.   [name] 09/04/2021, 9:36 PM

## 2021-09-05 DIAGNOSIS — F332 Major depressive disorder, recurrent severe without psychotic features: Secondary | ICD-10-CM

## 2021-09-05 MED ORDER — MELATONIN 3 MG PO TABS
6.0000 mg | ORAL_TABLET | Freq: Every day | ORAL | Status: DC
  Administered 2021-09-06 – 2021-09-08 (×3): 6 mg via ORAL
  Filled 2021-09-05 (×6): qty 2
  Filled 2021-09-05: qty 14
  Filled 2021-09-05: qty 2

## 2021-09-05 MED ORDER — HYDROXYZINE HCL 25 MG PO TABS
25.0000 mg | ORAL_TABLET | Freq: Three times a day (TID) | ORAL | Status: DC | PRN
  Administered 2021-09-05 – 2021-09-08 (×6): 25 mg via ORAL
  Filled 2021-09-05 (×7): qty 1

## 2021-09-05 NOTE — Progress Notes (Addendum)
Pt came to the nursing station at 0339 stating she was having nightmares from in appropriate content  she watched in the dayroom. Writer asked pt what was going on, pt requested Milk of Magnesia and it was given to her. Pt demanded to make a phone call, writer expressed the phones would be on at 0600, pt continued to escalate "I want to call someone who's on my side" Writer asked pt why she came up at 0339 just stating that there was something inappropriate being shown, but pt walked away from the medication window and would not continue to talk to Clinical research associate. The MHT that was in the dayroom this evening stated pt did not come into the dayroom during the evening. The check sheet from the evening showed pt was not in the dayroom . Please see MHT note detailing pt interaction in the dayroom. Pt presents with manipulating behaviors, tries to split staff and when she does not get her way she tries to get upset blaming everyone around that she is not getting therapeutic care.
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
## 2021-09-05 NOTE — BHH Group Notes (Signed)
Patient was engaged in Psycho-ed group today by participating in the music program and sing her goals to music.Educated patient on self-care and self-love.
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
## 2021-09-05 NOTE — Group Note (Addendum)
[name] Group Therapy Note   Group Date: 09/05/2021 Start Time: 1300 End Time: 1400   Type of Therapy and Topic:  Group Therapy:   Participation Level:  Active  Topic: Coping Skills  Due to the acuity and complex discharge plans, group was not held. Patient was provided therapeutic worksheets and asked to meet with CSW as needed.  Felizardo Hoffmann, LCSWA 09/05/2021  2:52 PM

## 2021-09-05 NOTE — BHH Group Notes (Signed)
The focus of this group is to help patients establish daily goals to achieve during treatment and discuss how the patient can incorporate goal setting into their daily lives to aide in recovery.  Pt attended morning goals group, but did not participate because she stated she had a horrible night and was yelled at by staff during the night and she refused to doing any groups or talk to anyone and she was waiting to hear back from her lawyer on what to do about the situation.

## 2021-09-05 NOTE — BHH Counselor (Signed)
CSW spoke with [name] (boyfriend) who states that the Pt has been having other people call him and tell him things that are happening in the hospital and he is not sure what is actually happening and what is not happening.  He states that he is experiencing stress and additional health issues from this experience and he would like something to be done.  Mr. Sherald Barge states that he would like his girlfriend to be transferred to [facility].  He states that as long as she is at [facility] she will not comply with any of the doctors wishes.  CSW stated to Mr. Wilborn that a hospital transfer may not be possible but that she would look into it.

## 2021-09-05 NOTE — Progress Notes (Signed)
Pt continues to refuse care with cbg monitoring, insulin coverage, and medication management.

## 2021-09-05 NOTE — Progress Notes (Signed)
Pt did not talk to Clinical research associate or Printmaker    09/05/21 1900  Psych Admission Type (Psych Patients Only)  Admission Status Involuntary  Psychosocial Assessment  Eye Contact Fair  Facial Expression Anxious  Affect Anxious  Speech Elective mutism  Interaction Assertive  Motor Activity Other (Comment) (WDL)  Appearance/Hygiene Unremarkable  Behavior Characteristics Resistant to care  Mood Suspicious;Preoccupied  Thought Process  Coherency Circumstantial  Content Blaming others;Preoccupation  Delusions None reported or observed  Perception WDL  Hallucination None reported or observed  Judgment Impaired  Confusion WDL  Danger to Self  Current suicidal ideation? Denies  Danger to Others  Danger to Others None reported or observed

## 2021-09-05 NOTE — Progress Notes (Signed)
Pt came into hallway reporting to nurse that she was having nightmares. Pt reported to nurse that an inappropriate movie was on in Dayroom causing her nightmares.    Pt did not attend group, did not come into dayroom to get a snack, and did not watch a movie with the group.   The movie watched was [id number] which is a family/childrens movie.   Pt entered the dayroom 10 minutes before dayroom was to close as we were looking for something to watch tomorrow. We stopped it on [employer] hoping to see the end to a popular show.  The show was not on and the TV was turned off.

## 2021-09-05 NOTE — Progress Notes (Signed)
Writer tried to talk to patient , pt did not Printmaker. Writer tried to talk to pt again , pt just walked past Clinical research associate.

## 2021-09-05 NOTE — Progress Notes (Signed)
Pt constantly states that everyone is part of her misery. Pt continues to state that things are causing and increasing her trauma, but pt continues to be focused on the things that happen to her and believe they are happening because people are trying to make things hard for her. Pt appears to have a paranoid attitude about things happening to her. Pt continued to state that every time she comes here it is the same thing, but Clinical research associate tried to explain to pt that if her experiences were so bad, the next time she goes to the hospital she needed to request to not come to [facility] . Writer apologized for how she felt and tried to explain to Clinical research associate that we can not help her if she does not allow [place], but pt continued to state that she was refusing treatment from [place] " I don't trust ya'll"     09/05/21 0000  Psych Admission Type (Psych Patients Only)  Admission Status Voluntary  Psychosocial Assessment  Patient Complaints Worrying  Eye Contact Brief  Facial Expression Anxious  Affect Blunted  Speech Logical/coherent  Interaction Assertive  Motor Activity Slow (WDL)  Appearance/Hygiene Disheveled  Behavior Characteristics Unwilling to participate  Mood Preoccupied  Thought Process  Coherency Circumstantial  Content Blaming others;Preoccupation  Delusions None reported or observed  Perception WDL  Hallucination None reported or observed  Judgment Impaired  Confusion WDL  Danger to Self  Current suicidal ideation? Denies  Danger to Others  Danger to Others None reported or observed

## 2021-09-06 MED ORDER — IBUPROFEN 400 MG PO TABS
400.0000 mg | ORAL_TABLET | Freq: Four times a day (QID) | ORAL | Status: DC | PRN
  Administered 2021-09-06 – 2021-09-08 (×3): 400 mg via ORAL
  Filled 2021-09-06 (×3): qty 1

## 2021-09-06 MED ORDER — IBUPROFEN 600 MG PO TABS
600.0000 mg | ORAL_TABLET | Freq: Four times a day (QID) | ORAL | Status: DC | PRN
  Administered 2021-09-07: 600 mg via ORAL
  Filled 2021-09-06: qty 1

## 2021-09-06 NOTE — BHH Group Notes (Signed)
.  Psychoeducational Group Note    Date:09/06/2021 Time: 1300-1400    Purpose of Group: . The group focus' on teaching patients on how to identify their needs and how Life Skills:  A group where two lists are made. What people need and what are things that we do that are healthy. The lists are developed by the patients and it is explained that we often do the actions that are not healthy to get our list of needs met.  to develop the coping skills needed to get their needs met  Participation Level:  Active  Participation Quality:  Appropriate  Affect:  Appropriate  Cognitive:  Oriented  Insight:  Improving  Engagement in Group:  Engaged  Additional Comments:  Rates her energy at an 8/10. Listening in the group and responding to questions  Paulino Rily
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
## 2021-09-06 NOTE — Progress Notes (Signed)
Pt came out requesting sleep medication, pt was directed to her nurse. Writer explained medications available, pt very condescending and smug when talking to Clinical research associate. Pt given Vistaril and Melatonin per [facility]

## 2021-09-06 NOTE — Progress Notes (Signed)
   09/06/21 1500  Psych Admission Type (Psych Patients Only)  Admission Status Involuntary  Psychosocial Assessment  Patient Complaints Suspiciousness  Eye Contact Fair  Facial Expression Anxious  Affect Anxious  Speech Elective mutism  Interaction Assertive  Motor Activity Other (Comment) (WDL)  Appearance/Hygiene Unremarkable  Behavior Characteristics Resistant to care  Mood Suspicious  Thought Process  Coherency Circumstantial  Content Blaming others;Preoccupation  Delusions None reported or observed  Perception WDL  Hallucination None reported or observed  Judgment Impaired  Confusion WDL  Danger to Self  Current suicidal ideation? Denies  Danger to Others  Danger to Others None reported or observed

## 2021-09-06 NOTE — Group Note (Signed)
[name] Group Therapy Note  Group Date: 09/06/2021 Start Time: 1005 End Time: 1105   Type of Therapy and Topic:  Group Therapy: Anger Cues and Responses  Participation Level:  Did Not Attend   Description of Group:   In this group, patients learned how to recognize the physical, cognitive, emotional, and behavioral responses they have to anger-provoking situations.  They identified a recent time they became angry and how they reacted.  They analyzed how their reaction was possibly beneficial and how it was possibly unhelpful.  The group discussed a variety of healthier coping skills that could help with such a situation in the future.  Focus was placed on how helpful it is to recognize the underlying emotions to our anger, because working on those can lead to a more permanent solution as well as our ability to focus on the important rather than the urgent.  Therapeutic Goals: Patients will remember their last incident of anger and how they felt emotionally and physically, what their thoughts were at the time, and how they behaved. Patients will identify how their behavior at that time worked for them, as well as how it worked against them. Patients will explore possible new behaviors to use in future anger situations. Patients will learn that anger itself is normal and cannot be eliminated, and that healthier reactions can assist with resolving conflict rather than worsening situations.  Summary of Patient Progress:  Erika Rhodes was invited to group, did not attend  Therapeutic Modalities:   Cognitive Behavioral Therapy    Lynnell Chad, LCSWA 09/06/2021  1:09 PM

## 2021-09-06 NOTE — [facility] Group Notes (Signed)
.  Psychoeducational Group Note  Date: 09/04/2021 Time: 0900-1000    Goal Setting   Purpose of Group: This group helps to provide patients with the steps of setting a goal that is specific, measurable, attainable, realistic and time specific. A discussion on how we keep ourselves stuck with negative self talk.    Participation Level:  Did not attend   Erika Rhodes

## 2021-09-06 NOTE — [facility] Group Notes (Signed)
Psychoeducational Group Note  Date: 09-06-21 Time:  1300  Group Topic/Focus:  Making Healthy Choices:   The focus of this group is to help patients identify negative/unhealthy choices they were using prior to admission and identify positive/healthier coping strategies to replace them upon discharge.In this group.   Participation Level:  Active  Participation Quality:  Appropriate  Affect:  Appropriate  Cognitive:  Oriented  Insight:  Improving  Engagement in Group:  Engaged  Additional Comments:.Attending the group. Participating fully.  Dione Housekeeper

## 2021-09-06 NOTE — Progress Notes (Signed)
Pt refused to talk to writer    09/06/21 2100  Psychosocial Assessment  Patient Complaints Suspiciousness  Eye Contact Fair  Facial Expression Anxious  Affect Anxious  Speech Elective mutism (towards Clinical research associate)  Information systems manager Activity Other (Comment) (WDL)  Appearance/Hygiene Unremarkable  Behavior Characteristics Resistant to care  Mood Suspicious  Thought Process  Coherency Circumstantial  Content Blaming others;Preoccupation  Delusions None reported or observed  Perception WDL  Hallucination None reported or observed  Judgment Impaired  Confusion WDL  Danger to Self  Current suicidal ideation? Denies  Danger to Others  Danger to Others None reported or observed

## 2021-09-06 NOTE — Progress Notes (Signed)
Pt continues to refuse care, pt not speaking to Clinical research associate. Pt refused to get CBG taken

## 2021-09-07 NOTE — [facility] Group Notes (Signed)
[facility] Group Notes:  (Nursing/MHT/Case Management/Adjunct)  Date:  09/07/2021  Time:  4:52 PM  Type of Therapy:  Music Therapy  Participation Level:  Active  Participation Quality:  Appropriate  Affect:  Appropriate  Cognitive:  Appropriate  Insight:  Appropriate  Engagement in Group:  Engaged  Modes of Intervention:  Activity  Summary of Progress/Problems:Patient was engaged and participated in the sing-a-long. Patient said her depression was lifted."    Reymundo Poll 09/07/2021, 4:52 PM

## 2021-09-07 NOTE — Progress Notes (Addendum)
[facility] MD Progress Note  09/07/2021 12:01 PM Nicolena Schurman  MRN:  [id number]   Subjective:  Erika Rhodes reported " this establishment does not have any boundaries and I came here for help."  Testing was seen and evaluated face-to-face.  She is awake alert and oriented x3.  Denying suicidal or homicidal ideations.  Denies auditory or visual hallucinations.  Reports a history with complex PTSD, major depressive disorder, generalized anxiety disorder.  Reports " I do not feel like attending group because it is something different every day."  RN staff patient reports patient has been refusing vitals and can be confrontational.  Patient reports she does not feel safe with staff touching her.  States " I feel people's energy on the empath and my feed off of the environment" patient was encouraged to take medications as indicated.  Consider blood pressure monitoring and EKG today.  Chart reviewed medications was discontinued due to prolonged QT.  she continues to refuse during this assessment.  Reports a good appetite.  States she is rested well throughout the night.  Support, encouragement and reassurance was provided    Principal Problem: MDD (major depressive disorder), recurrent episode, severe (HCC) Diagnosis: Principal Problem:   MDD (major depressive disorder), recurrent episode, severe (HCC) Active Problems:   Intentional overdose (HCC)   Type 2 diabetes mellitus with hyperglycemia (HCC)   GERD (gastroesophageal reflux disease)  Total Time spent with patient: 15 minutes  Past Psychiatric History:   Past Medical History:  Past Medical History:  Diagnosis Date   Allergic reaction    Anxiety    Asthma    Depression    DM (diabetes mellitus), gestational    Migraines    Rheumatoid arthritis (HCC) 2016   Diagnosed Dr.  Patsi Sears; has been treated with MTX, Humira, etc, but made her sick.  Was taking CBD oil and stopped as tested positive in a drug screen for work.    Past Surgical  History:  Procedure Laterality Date   ABDOMINAL HYSTERECTOMY  2017   Fibroids; Both ovaries intact still   BACK SURGERY     CESAREAN SECTION  2009   CHOLECYSTECTOMY  2010   laparoscopic   TONSILLECTOMY     Family History:  Family History  Problem Relation Age of Onset   Arthritis Mother        Rheumatoid   Depression Daughter        and anxiety   Allergies Son    Family Psychiatric  History:  Social History:  Social History   Substance and Sexual Activity  Alcohol Use Yes   Alcohol/week: 2.0 standard drinks   Types: 2 Glasses of wine per week   Comment: 2 drinks 1-2xmonth     Social History   Substance and Sexual Activity  Drug Use Not Currently    Social History   Socioeconomic History   Marital status: Media planner    Spouse name: Not on file   Number of children: 5   Years of education: Not on file   Highest education level: Associate degree: academic program  Occupational History   Not on file  Tobacco Use   Smoking status: Never   Smokeless tobacco: Never  Vaping Use   Vaping Use: Some days  Substance and Sexual Activity   Alcohol use: Yes    Alcohol/week: 2.0 standard drinks    Types: 2 Glasses of wine per week    Comment: 2 drinks 1-2xmonth   Drug use: Not Currently   Sexual activity:  Yes    Birth control/protection: Surgical  Other Topics Concern   Not on file  Social History Narrative   Lives with current boyfriend   See history of present illness for more history 04/22/2020   Social Determinants of Health   Financial Resource Strain: Not on file  Food Insecurity: Not on file  Transportation Needs: Not on file  Physical Activity: Not on file  Stress: Not on file  Social Connections: Not on file   Additional Social History:                         Sleep: Good  Appetite:  Fair  Current Medications: Current Facility-Administered Medications  Medication Dose Route Frequency Provider Last Rate Last Admin    acetaminophen (TYLENOL) tablet 650 mg  650 mg Oral Q6H PRN Maryagnes Amos, FNP   650 mg at 09/06/21 0756   alum & mag hydroxide-simeth (MAALOX/MYLANTA) 200-200-20 MG/5ML suspension 15 mL  15 mL Oral Q4H PRN Maryagnes Amos, FNP       hydrOXYzine (ATARAX/VISTARIL) tablet 25 mg  25 mg Oral TID PRN Armandina Stammer I, NP   25 mg at 09/06/21 2312   ibuprofen (ADVIL) tablet 400 mg  400 mg Oral Q6H PRN [name], MD   400 mg at 09/06/21 1617   Or   ibuprofen (ADVIL) tablet 600 mg  600 mg Oral Q6H PRN [name], MD       insulin aspart (novoLOG) injection 0-20 Units  0-20 Units Subcutaneous TID WC Starkes-Perry, Juel Burrow, FNP       lip balm (CARMEX) ointment   Topical PRN Starkes-Perry, Juel Burrow, FNP       magnesium hydroxide (MILK OF MAGNESIA) suspension 30 mL  30 mL Oral Daily PRN Maryagnes Amos, FNP   30 mL at 09/06/21 0756   melatonin tablet 6 mg  6 mg Oral QHS Nwoko, Agnes I, NP   6 mg at 09/06/21 2312   pantoprazole (PROTONIX) EC tablet 40 mg  40 mg Oral Daily Maryagnes Amos, FNP   40 mg at 09/06/21 0756    Lab Results: No results found for this or any previous visit (from the past 48 hour(s)).  Blood Alcohol level:  Lab Results  Component Value Date   ETH <10 09/01/2021   ETH <10 05/29/2021    Metabolic Disorder Labs: Lab Results  Component Value Date   HGBA1C 7.7 (H) 09/03/2021   MPG 174.29 09/03/2021   MPG 186 05/31/2021   No results found for: PROLACTIN Lab Results  Component Value Date   CHOL 204 (H) 09/03/2021   TRIG 165 (H) 09/03/2021   HDL 41 09/03/2021   CHOLHDL 5.0 09/03/2021   VLDL 33 09/03/2021   LDLCALC 130 (H) 09/03/2021   LDLCALC 92 05/31/2021    Physical Findings: AIMS: Facial and Oral Movements Muscles of Facial Expression: None, normal Lips and Perioral Area: None, normal Jaw: None, normal Tongue: None, normal,Extremity Movements Upper (arms, wrists, hands, fingers): None, normal Lower (legs, knees, ankles, toes):  None, normal, Trunk Movements Neck, shoulders, hips: None, normal, Overall Severity Severity of abnormal movements (highest score from questions above): None, normal Incapacitation due to abnormal movements: None, normal Patient's awareness of abnormal movements (rate only patient's report): No Awareness, Dental Status Current problems with teeth and/or dentures?: No Does patient usually wear dentures?: No  CIWA:    COWS:     Musculoskeletal: Strength & Muscle Tone: within normal limits Gait &  Station: normal Patient leans: N/A  Psychiatric Specialty Exam:  Presentation  General Appearance: -- (Lying in bed with eye mask on)  Eye Contact:None  Speech:Clear and Coherent; Normal Rate  Speech Volume:Normal  Handedness:Right   Mood and Affect  Mood:Irritable  Affect:Congruent   Thought Process  Thought Processes:Goal Directed  Descriptions of Associations:Intact  Orientation:Full (Time, Place and Person)  Thought Content:Logical (Denies SI/HI/AVH, does not vocalize delusions)  History of Schizophrenia/Schizoaffective disorder:No  Duration of Psychotic Symptoms:N/A  Hallucinations:Hallucinations: None  Ideas of Reference:None  Suicidal Thoughts:Suicidal Thoughts: No  Homicidal Thoughts:Homicidal Thoughts: No   Sensorium  Memory:Immediate Fair; Recent Fair; Remote Fair  Judgment:Poor  Insight:Poor   Executive Functions  Concentration:Good  Attention Span:Good  Recall:Good  Fund of Knowledge:Fair  Language:Good   Psychomotor Activity  Psychomotor Activity:Psychomotor Activity: Normal   Assets  Assets:Communication Skills; Housing; Intimacy; Resilience; Social Support   Sleep  Sleep:Sleep: Fair Number of Hours of Sleep: 6.75    Physical Exam: Physical Exam Vitals reviewed.  Cardiovascular:     Rate and Rhythm: Normal rate and regular rhythm.  Psychiatric:        Attention and Perception: Attention and perception normal.         Mood and Affect: Mood normal.        Behavior: Behavior normal. Behavior is cooperative.        Thought Content: Thought content normal.        Cognition and Memory: Cognition normal.        Judgment: Judgment normal.   Review of Systems  Psychiatric/Behavioral:  Positive for depression. Negative for suicidal ideas. The patient is nervous/anxious.   All other systems reviewed and are negative. Blood pressure 115/72, pulse 81, temperature 98.6 F (37 C), temperature source Oral, resp. rate 18, height 5' 3.5" (1.613 m), weight 91.6 kg, SpO2 99 %. Body mass index is 35.22 kg/m.   Treatment Plan Summary: Daily contact with patient to assess and evaluate symptoms and progress in treatment and Medication management  Continue with current treatment plan on 09/07/2021 as listed below except for noted  -Patient continues to refuse all medications at this time  CSW to continue working on discharge disposition Patient encouraged to participate within the therapeutic milieu  Oneta Rack, NP 09/07/2021, 12:01 PM

## 2021-09-07 NOTE — [facility] Group Notes (Signed)
Patient  did not participate in group today

## 2021-09-07 NOTE — Group Note (Signed)
[name] Group Therapy Note   Group Date: 09/07/2021 Start Time: 1000 End Time: 1100   Type of Therapy and Topic:  Group Therapy: Boundaries  Participation Level:  Did Not Attend  Description of Group: This group will address the use of boundaries in their personal lives. Patients will explore why boundaries are important, the difference between healthy and unhealthy boundaries, and negative and postive outcomes of different boundaries and will look at how boundaries can be crossed.  Patients will be encouraged to identify current boundaries in their own lives and identify what kind of boundary is being set. Facilitators will guide patients in utilizing problem-solving interventions to address and correct types boundaries being used and to address when no boundary is being used. Understanding and applying boundaries will be explored and addressed for obtaining and maintaining a balanced life. Patients will be encouraged to explore ways to assertively make their boundaries and needs known to significant others in their lives, using other group members and facilitator for role play, support, and feedback.  Therapeutic Goals:  1.  Patient will identify areas in their life where setting clear boundaries could be  used to improve their life.  2.  Patient will identify signs/triggers that a boundary is not being respected. 3.  Patient will identify two ways to set boundaries in order to achieve balance in  their lives: 4.  Patient will demonstrate ability to communicate their needs and set boundaries  through discussion and/or role plays  Summary of Patient Progress:  The patient was invited to group, did not attend.   Therapeutic Modalities:   Cognitive Behavioral Therapy Solution-Focused Therapy  Lynnell Chad, LCSWA 09/07/2021  3:15 PM

## 2021-09-07 NOTE — Progress Notes (Signed)
Pt did not attend group after verbal prompt. 

## 2021-09-07 NOTE — Progress Notes (Signed)
Pt asked to shave and was observed shaving. Both razor and cap returned to this Clinical research associate for disposal.

## 2021-09-08 ENCOUNTER — Encounter (HOSPITAL_COMMUNITY): Payer: Self-pay

## 2021-09-08 NOTE — Progress Notes (Signed)
Adult Psychoeducational Group Note  Date:  09/08/2021 Time:  11:34 AM  Group Topic/Focus:  Goals Group:   The focus of this group is to help patients establish daily goals to achieve during treatment and discuss how the patient can incorporate goal setting into their daily lives to aide in recovery.  Participation Level:  Active  Participation Quality:  Appropriate  Affect:  Appropriate  Cognitive:  Appropriate  Insight: Appropriate  Engagement in Group:  Engaged  Modes of Intervention:  Discussion  Additional Comments:  Pt stated his goal for the day is to continue to get familiar with her environment.  Wynema Birch D 09/08/2021, 11:34 AM

## 2021-09-08 NOTE — Progress Notes (Signed)
Pt visible on the unit this evening   09/08/21 0945  Psych Admission Type (Psych Patients Only)  Admission Status Involuntary  Psychosocial Assessment  Patient Complaints Anxiety  Eye Contact Fair  Facial Expression Anxious;Animated Surveyor, minerals & engaged with peers. Refuses to engage with staff for nursing interventions and assessment)  Affect Appropriate to circumstance  Speech Elective mutism (towards staff)  Interaction Assertive  Motor Activity Other (Comment) (WDL)  Appearance/Hygiene Unremarkable  Behavior Characteristics Cooperative  Mood Anxious  Aggressive Behavior  Effect No apparent injury  Thought Process  Coherency Circumstantial  Content Blaming others;Preoccupation  Delusions None reported or observed  Perception WDL  Hallucination None reported or observed  Judgment Impaired  Confusion WDL  Danger to Self  Current suicidal ideation? Denies  Danger to Others  Danger to Others None reported or observed

## 2021-09-08 NOTE — Group Note (Signed)
[name] Group Therapy Note   Group Date: 09/08/2021 Start Time: 1300 End Time: 1400   Type of Therapy and Topic:  Group Therapy:  Fear  Participation Level:  Did Not Attend  Description of Group: In this process group, patients discussed things that make feel them feel nervous or scared.  Patients discussed what they think about when they feel this emotions.  Patients were given the opportunity to share openly and support each others.  The group discussed where they feel their fears in their body.  Patients were encouraged to identify new coping mechanisms to use when fears arise.  Therapeutic Goals Patient will verbalize fears and what thoughts occur when they feel this emotion. Patients will verbalize where on their body they feel fear. Patients will explore coping mechanisms they can use in the future when fear arises.   Summary of Patient Progress:  Did not attend    Therapeutic Modalities Cognitive Behavioral Therapy Motivational Interviewing  [name], [place] 09/08/2021  2:02 PM

## 2021-09-08 NOTE — [facility] Group Notes (Signed)
[facility] Group Notes:  (Nursing/MHT/Case Management/Adjunct)  Date:  09/08/2021  Time:  10:44 PM  Type of Therapy:  Psychoeducational Skills  Participation Level:  Active  Participation Quality:  Appropriate  Affect:  Appropriate  Cognitive:  Appropriate  Insight:  Appropriate  Engagement in Group:  Developing/Improving  Modes of Intervention:  Education  Summary of Progress/Problems: The patient attended the evening AA meeting and was appropriate.   Hazle Coca S 09/08/2021, 10:44 PM

## 2021-09-08 NOTE — Plan of Care (Signed)
  Problem: Education: Goal: Knowledge of the prescribed therapeutic regimen will improve Outcome: Progressing   Problem: Activity: Goal: Interest or engagement in leisure activities will improve Outcome: Not Progressing   Problem: Coping: Goal: Will verbalize feelings Outcome: Not Progressing   Problem: Health Behavior/Discharge Planning: Goal: Compliance with therapeutic regimen will improve Outcome: Not Progressing

## 2021-09-08 NOTE — Progress Notes (Signed)
Patient presents pleasant, denies any SI, HI,AVH. Patient refused cbg this evening states she does not take insulin at home and does not need her finger stuck here. Patient did take her sleep aid as well as requested med for anxiety and Ibuprofen for generalized pain with good relief. Patient interacting with peers appropriately. Encouragement and support provided. Safety checks maintained. Medications given as prescribed. Pt receptive and remains safe on unit with q 15 min checks.

## 2021-09-08 NOTE — Group Note (Signed)
Occupational Therapy Group Note  Group Topic:Feelings Management  Group Date: 09/08/2021 Start Time: 1400 End Time: 1445 Facilitators: Rolando Whitby, OT/L    Group Description: Group encouraged increased participation and engagement through discussion focused on STRENGTHS. Patients were encouraged to fill out a worksheet to structure discussion, identifying ten strengths that they currently have. Patients were then instructed to utilize various symbols to identify which strengths help them in their relationships, school/profession, and leisure participation. Discussion also focused on identifying one strength they do not currently have but would like. Discussion within the group followed with patients sharing their responses and highlighting their own personal strengths.   Therapeutic Goals: Identify strengths vs weaknesses Discuss and identify ways we can highlight our strengths     Participation Level: Did not attend   Plan: Continue to engage patient in OT groups 2 - 3x/week.  09/08/2021  Graiden Henes, OT/L 

## 2021-09-08 NOTE — Progress Notes (Signed)
Pt continues to refused CBG monitoring on separate approach. Denies SI, HI and AVH  when assessed at the beginning of this shift. Compliant with scheduled medication. Received PRN Vistaril, Mylanta and Advil (see Emar) as ordered and reported relief when assessed. Visible in hall and dayroom for majority of this shift. Noted with bright affect, very interactive with peers throughout the shift. Approaches staff and pleasant when engaged.  Support and reassurance offered to pt. Q 15 minutes safety checks remains effective without incident. All medications given with verbal education and effects monitored.  Pt is denies concerns at this time.

## 2021-09-08 NOTE — BH IP Treatment Plan (Signed)
Interdisciplinary Treatment and Diagnostic Plan Update  09/08/2021 Time of Session: 10:00am Navina Wohlers MRN: [id number]  Principal Diagnosis: MDD (major depressive disorder), recurrent episode, severe (HCC)  Secondary Diagnoses: Principal Problem:   MDD (major depressive disorder), recurrent episode, severe (HCC) Active Problems:   Intentional overdose (HCC)   Type 2 diabetes mellitus with hyperglycemia (HCC)   GERD (gastroesophageal reflux disease)   Current Medications:  Current Facility-Administered Medications  Medication Dose Route Frequency Provider Last Rate Last Admin   acetaminophen (TYLENOL) tablet 650 mg  650 mg Oral Q6H PRN Maryagnes Amos, FNP   650 mg at 09/06/21 0756   alum & mag hydroxide-simeth (MAALOX/MYLANTA) 200-200-20 MG/5ML suspension 15 mL  15 mL Oral Q4H PRN Maryagnes Amos, FNP   15 mL at 09/08/21 0526   hydrOXYzine (ATARAX/VISTARIL) tablet 25 mg  25 mg Oral TID PRN Armandina Stammer I, NP   25 mg at 09/07/21 2104   ibuprofen (ADVIL) tablet 400 mg  400 mg Oral Q6H PRN [name], MD   400 mg at 09/06/21 1617   Or   ibuprofen (ADVIL) tablet 600 mg  600 mg Oral Q6H PRN [name], MD   600 mg at 09/07/21 2103   insulin aspart (novoLOG) injection 0-20 Units  0-20 Units Subcutaneous TID WC Starkes-Perry, Juel Burrow, FNP       lip balm (CARMEX) ointment   Topical PRN Starkes-Perry, Juel Burrow, FNP       magnesium hydroxide (MILK OF MAGNESIA) suspension 30 mL  30 mL Oral Daily PRN Maryagnes Amos, FNP   30 mL at 09/06/21 0756   melatonin tablet 6 mg  6 mg Oral QHS Armandina Stammer I, NP   6 mg at 09/07/21 2103   pantoprazole (PROTONIX) EC tablet 40 mg  40 mg Oral Daily Maryagnes Amos, FNP   40 mg at 09/08/21 0806   PTA Medications: Medications Prior to Admission  Medication Sig Dispense Refill Last Dose   albuterol (VENTOLIN HFA) 108 (90 Base) MCG/ACT inhaler Inhale 1-2 puffs into the lungs every 4 (four) hours as needed for wheezing or  shortness of breath. 1 each 0    clindamycin (CLINDAGEL) 1 % gel Apply 1 application topically 2 (two) times daily.      clindamycin-benzoyl peroxide (BENZACLIN) gel Apply 1 application topically 2 (two) times daily.      diphenhydrAMINE (BENADRYL) 25 MG tablet Take 25 mg by mouth every 6 (six) hours as needed for allergies.      doxycycline (VIBRA-TABS) 100 MG tablet Take 100 mg by mouth 2 (two) times daily.      DULoxetine (CYMBALTA) 30 MG capsule Take 1 capsule (30 mg total) by mouth daily. (Patient not taking: No sig reported) 30 capsule 0    levothyroxine (SYNTHROID) 50 MCG tablet Take 1 tablet (50 mcg total) by mouth daily at 6 (six) AM. (Patient not taking: Reported on 09/01/2021) 30 tablet 0    MELATONIN PO Take 2 tablets by mouth at bedtime as needed (sleep).      metFORMIN (FORTAMET) 1000 MG (OSM) 24 hr tablet Take 2 tablets (2,000 mg total) by mouth daily with supper. (Patient not taking: No sig reported) 30 tablet 0    metFORMIN (GLUCOPHAGE) 1000 MG tablet Take 1,000 mg by mouth 2 (two) times daily. (Patient not taking: No sig reported)      naproxen sodium (ALEVE) 220 MG tablet Take 440 mg by mouth 2 (two) times daily as needed (pain/headache/pain).  oxymetazoline (NASAL SPRAY 12 HOUR) 0.05 % nasal spray Place 1 spray into both nostrils 2 (two) times daily as needed for congestion.      pantoprazole (PROTONIX) 40 MG tablet Take 1 tablet (40 mg total) by mouth daily. 30 tablet 0    QUEtiapine (SEROQUEL) 400 MG tablet Take 1 tablet (400 mg total) by mouth at bedtime. (Patient not taking: No sig reported) 30 tablet 0    SUMAtriptan (IMITREX) 50 MG tablet Take 1 tablet (50 mg total) by mouth every 2 (two) hours as needed for migraine or headache. May repeat in 2 hours if headache persists or recurs. 10 tablet 0    tretinoin (RETIN-A) 0.025 % cream Apply 1 application topically at bedtime.       Patient Stressors: Loss of custody of children   Marital or family conflict    Patient  Strengths: Forensic psychologist fund of knowledge  Physical Health  Supportive family/friends   Treatment Modalities: Medication Management, Group therapy, Case management,  1 to 1 session with clinician, Psychoeducation, Recreational therapy.   Physician Treatment Plan for Primary Diagnosis: MDD (major depressive disorder), recurrent episode, severe (HCC) Long Term Goal(s): Improvement in symptoms so as ready for discharge   Short Term Goals: Ability to identify changes in lifestyle to reduce recurrence of condition will improve Ability to verbalize feelings will improve Ability to disclose and discuss suicidal ideas Ability to demonstrate self-control will improve Ability to identify and develop effective coping behaviors will improve Compliance with prescribed medications will improve  Medication Management: Evaluate patient's response, side effects, and tolerance of medication regimen.  Therapeutic Interventions: 1 to 1 sessions, Unit Group sessions and Medication administration.  Evaluation of Outcomes: Progressing  Physician Treatment Plan for Secondary Diagnosis: Principal Problem:   MDD (major depressive disorder), recurrent episode, severe (HCC) Active Problems:   Intentional overdose (HCC)   Type 2 diabetes mellitus with hyperglycemia (HCC)   GERD (gastroesophageal reflux disease)  Long Term Goal(s): Improvement in symptoms so as ready for discharge   Short Term Goals: Ability to identify changes in lifestyle to reduce recurrence of condition will improve Ability to verbalize feelings will improve Ability to disclose and discuss suicidal ideas Ability to demonstrate self-control will improve Ability to identify and develop effective coping behaviors will improve Compliance with prescribed medications will improve     Medication Management: Evaluate patient's response, side effects, and tolerance of medication regimen.  Therapeutic Interventions: 1 to 1  sessions, Unit Group sessions and Medication administration.  Evaluation of Outcomes: Progressing   RN Treatment Plan for Primary Diagnosis: MDD (major depressive disorder), recurrent episode, severe (HCC) Long Term Goal(s): Knowledge of disease and therapeutic regimen to maintain health will improve  Short Term Goals: Ability to remain free from injury will improve, Ability to verbalize frustration and anger appropriately will improve, Ability to demonstrate self-control, Ability to participate in decision making will improve, Ability to verbalize feelings will improve, Ability to disclose and discuss suicidal ideas, and Compliance with prescribed medications will improve  Medication Management: RN will administer medications as ordered by provider, will assess and evaluate patient's response and provide education to patient for prescribed medication. RN will report any adverse and/or side effects to prescribing provider.  Therapeutic Interventions: 1 on 1 counseling sessions, Psychoeducation, Medication administration, Evaluate responses to treatment, Monitor vital signs and CBGs as ordered, Perform/monitor CIWA, COWS, AIMS and Fall Risk screenings as ordered, Perform wound care treatments as ordered.  Evaluation of Outcomes: Progressing   [name]  Treatment Plan for Primary Diagnosis: MDD (major depressive disorder), recurrent episode, severe (HCC) Long Term Goal(s): Safe transition to appropriate next level of care at discharge, Engage patient in therapeutic group addressing interpersonal concerns.  Short Term Goals: Engage patient in aftercare planning with referrals and resources, Increase social support, Increase ability to appropriately verbalize feelings, Increase emotional regulation, Facilitate acceptance of mental health diagnosis and concerns, Identify triggers associated with mental health/substance abuse issues, and Increase skills for wellness and recovery  Therapeutic  Interventions: Assess for all discharge needs, 1 to 1 time with Social worker, Explore available resources and support systems, Assess for adequacy in community support network, Educate family and significant other(s) on suicide prevention, Complete Psychosocial Assessment, Interpersonal group therapy.  Evaluation of Outcomes: Progressing   Progress in Treatment: Attending groups: No. Participating in groups: No. Taking medication as prescribed: Yes. Toleration medication: Yes. Family/Significant other contact made: Yes, individual(s) contacted:  boyfriend Patient understands diagnosis: No. Discussing patient identified problems/goals with staff: Yes. Medical problems stabilized or resolved: Yes. Denies suicidal/homicidal ideation: Yes. Issues/concerns per patient self-inventory: No.     New problem(s) identified: No, Describe:  None    New Short Term/Long Term Goal(s): medication stabilization, elimination of SI thoughts, development of comprehensive mental wellness plan.    Patient Goals: Did not attend    Discharge Plan or Barriers: Patient has declined all services    Reason for Continuation of Hospitalization:  Depression Medication stabilization    Estimated Length of Stay: 3 to 5 days    Scribe for Treatment Team: Otelia Santee, [name] 09/08/2021 11:05 AM
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
## 2021-09-09 MED ORDER — MELATONIN 3 MG PO TABS: 6.0000 mg | ORAL_TABLET | Freq: Every day | ORAL | 0 refills | Status: DC

## 2021-09-09 NOTE — Discharge Summary (Signed)
Physician Discharge Summary Note  Patient:  Erika Rhodes is an 40 y.o., female MRN:  [id number] DOB:  Mar 30, 1981 Patient phone:  [phone] (home)  Patient address:   [address] [place] [address],  Total Time spent with patient: 15 minutes  Date of Admission:  09/02/2021 Date of Discharge: 11/01/20222  Reason for Admission: Per admission assessment note: "Erika Rhodes is a 40 year old female with a psychiatric history of MDD-recurrent/severe admitted voluntarily after an overdose attempt in which she took a small number of pills.Two separate attempts were made to interview the patient today.  On the first attempt to interview this patient, this Clinical research associate and the resident attempted to interview the patient, which the patient refused despite.  A second attempt to review the patient was with only this Clinical research associate, again the patient refused this point in the interview.  The patient also refused physical exam."  Principal Problem: MDD (major depressive disorder), recurrent episode, severe (HCC) Discharge Diagnoses: Principal Problem:   MDD (major depressive disorder), recurrent episode, severe (HCC) Active Problems:   Intentional overdose (HCC)   Type 2 diabetes mellitus with hyperglycemia (HCC)   GERD (gastroesophageal reflux disease)   Past Psychiatric History:   Past Medical History:  Past Medical History:  Diagnosis Date   Allergic reaction    Anxiety    Asthma    Depression    DM (diabetes mellitus), gestational    Migraines    Rheumatoid arthritis (HCC) 2016   Diagnosed Dr.  Patsi Sears; has been treated with MTX, Humira, etc, but made her sick.  Was taking CBD oil and stopped as tested positive in a drug screen for work.    Past Surgical History:  Procedure Laterality Date   ABDOMINAL HYSTERECTOMY  2017   Fibroids; Both ovaries intact still   BACK SURGERY     CESAREAN SECTION  2009   CHOLECYSTECTOMY  2010   laparoscopic   TONSILLECTOMY     Family History:   Family History  Problem Relation Age of Onset   Arthritis Mother        Rheumatoid   Depression Daughter        and anxiety   Allergies Son    Family Psychiatric  History:  Social History:  Social History   Substance and Sexual Activity  Alcohol Use Yes   Alcohol/week: 2.0 standard drinks   Types: 2 Glasses of wine per week   Comment: 2 drinks 1-2xmonth     Social History   Substance and Sexual Activity  Drug Use Not Currently    Social History   Socioeconomic History   Marital status: Media planner    Spouse name: Not on file   Number of children: 5   Years of education: Not on file   Highest education level: Associate degree: academic program  Occupational History   Not on file  Tobacco Use   Smoking status: Never   Smokeless tobacco: Never  Vaping Use   Vaping Use: Some days  Substance and Sexual Activity   Alcohol use: Yes    Alcohol/week: 2.0 standard drinks    Types: 2 Glasses of wine per week    Comment: 2 drinks 1-2xmonth   Drug use: Not Currently   Sexual activity: Yes    Birth control/protection: Surgical  Other Topics Concern   Not on file  Social History Narrative   Lives with current boyfriend   See history of present illness for more history 04/22/2020   Social Determinants of Health  Financial Resource Strain: Not on file  Food Insecurity: Not on file  Transportation Needs: Not on file  Physical Activity: Not on file  Stress: Not on file  Social Connections: Not on file    Hospital Course:  Erika Rhodes was admitted for MDD (major depressive disorder), recurrent episode, severe (HCC)  and crisis management.  Pt was treated discharged with the medications listed below under Medication List.  Medical problems were identified and treated as needed.  Home medications were restarted as appropriate.  Improvement was monitored by observation and Erika Rhodes 's daily report of symptom reduction.  Emotional and mental status was monitored  by daily self-inventory reports completed by Erika Rhodes and clinical staff.         Erika Rhodes was evaluated by the treatment team for stability and plans for continued recovery upon discharge. Erika Rhodes 's motivation was an integral factor for scheduling further treatment. Employment, transportation, bed availability, health status, family support, and any pending legal issues were also considered during hospital stay. Pt was offered further treatment options upon discharge including but not limited to Residential, Intensive Outpatient, and Outpatient treatment.  Erika Rhodes will follow up with the services as listed below under Follow Up Information.     Upon completion of this admission the patient was both mentally and medically stable for discharge denying suicidal/homicidal ideation, auditory/visual/tactile hallucinations, delusional thoughts and paranoia.  Patient was seen and evaluated by this NP and attending psychiatrist Erika Rhodes at discharge.  She continues to present passive and guarded.  Denying suicidal or homicidal ideations.  Denies auditory visual hallucinations.  Patient was offered follow-up with intensive outpatient programming and/or partial hospitalization programming however patient denied.  We will make recommendations available in discharge summary follow-up.  During her hospitalization patient declined medications/vitals and daily group session.  Erika Rhodes responded well to treatment with hydroxyzine and daily group therapy sessions was offered. Pt demonstrated improvement without reported or observed adverse effects to the point of stability appropriate for outpatient management. Pertinent labs include: TSH 6.29, elevated lipid panel cholesterol 204 triglycerides 165 and LDL is 130  Prolactin 28 (high), Hgb A1C 7.7 (high), for which outpatient follow-up is necessary for lab recheck as mentioned below. Reviewed CBC, CMP, BAL, and UDS; all unremarkable aside from  noted exceptions.    Physical Findings: AIMS: Facial and Oral Movements Muscles of Facial Expression: None, normal Lips and Perioral Area: None, normal Jaw: None, normal Tongue: None, normal,Extremity Movements Upper (arms, wrists, hands, fingers): None, normal Lower (legs, knees, ankles, toes): None, normal, Trunk Movements Neck, shoulders, hips: None, normal, Overall Severity Severity of abnormal movements (highest score from questions above): None, normal Incapacitation due to abnormal movements: None, normal Patient's awareness of abnormal movements (rate only patient's report): No Awareness, Dental Status Current problems with teeth and/or dentures?: No Does patient usually wear dentures?: No  CIWA:    COWS:     Musculoskeletal: Strength & Muscle Tone: within normal limits Gait & Station: normal Patient leans: N/A   Psychiatric Specialty Exam:  Presentation  General Appearance: Appropriate for Environment; Casual; Fairly Groomed  Eye Contact:Minimal  Speech:Clear and Coherent; Normal Rate  Speech Volume:Normal  Handedness:Right   Mood and Affect  Mood:Euthymic  Affect:Congruent; Appropriate   Thought Process  Thought Processes:Coherent; Linear  Descriptions of Associations:Intact  Orientation:Full (Time, Place and Person)  Thought Content:Logical  History of Schizophrenia/Schizoaffective disorder:No  Duration of Psychotic Symptoms:N/A  Hallucinations:Hallucinations: None  Ideas of Reference:None  Suicidal Thoughts:Suicidal Thoughts: No  Homicidal  Thoughts:Homicidal Thoughts: No   Sensorium  Memory:Immediate Good; Recent Good; Remote Good  Judgment:Good  Insight:Good   Executive Functions  Concentration:Good  Attention Span:Good  Recall:Good  Fund of Knowledge:Good  Language:Good   Psychomotor Activity  Psychomotor Activity:Psychomotor Activity: Normal   Assets  Assets:Communication Skills; Housing; Intimacy; Resilience;  Social Support   Sleep  Sleep:Sleep: Good Number of Hours of Sleep: 5.75    Physical Exam: Physical Exam Vitals and nursing note reviewed.  Eyes:     Pupils: Pupils are equal, round, and reactive to light.  Cardiovascular:     Rate and Rhythm: Normal rate and regular rhythm.  Pulmonary:     Effort: Pulmonary effort is normal.  Psychiatric:        Attention and Perception: Attention normal.        Mood and Affect: Mood normal.        Speech: Speech normal.        Behavior: Behavior normal.        Thought Content: Thought content normal.        Cognition and Memory: Cognition normal.        Judgment: Judgment normal.   Review of Systems  HENT: Negative.    Eyes: Negative.   Cardiovascular: Negative.   Musculoskeletal: Negative.   Skin: Negative.   Psychiatric/Behavioral:  Positive for depression. Negative for hallucinations and suicidal ideas. The patient is nervous/anxious.   All other systems reviewed and are negative. Blood pressure 115/72, pulse 81, temperature 98.6 F (37 C), temperature source Oral, resp. rate 18, height 5' 3.5" (1.613 m), weight 91.6 kg, SpO2 99 %. Body mass index is 35.22 kg/m.   Social History   Tobacco Use  Smoking Status Never  Smokeless Tobacco Never   Tobacco Cessation:  N/A, patient does not currently use tobacco products   Blood Alcohol level:  Lab Results  Component Value Date   ETH <10 09/01/2021   ETH <10 05/29/2021    Metabolic Disorder Labs:  Lab Results  Component Value Date   HGBA1C 7.7 (H) 09/03/2021   MPG 174.29 09/03/2021   MPG 186 05/31/2021   No results found for: PROLACTIN Lab Results  Component Value Date   CHOL 204 (H) 09/03/2021   TRIG 165 (H) 09/03/2021   HDL 41 09/03/2021   CHOLHDL 5.0 09/03/2021   VLDL 33 09/03/2021   LDLCALC 130 (H) 09/03/2021   LDLCALC 92 05/31/2021    See Psychiatric Specialty Exam and Suicide Risk Assessment completed by Attending Physician prior to discharge.  Discharge  destination:  Home  Is patient on multiple antipsychotic therapies at discharge:  No   Has Patient had three or more failed trials of antipsychotic monotherapy by history:  No  Recommended Plan for Multiple Antipsychotic Therapies: NA  Discharge Instructions     Diet - low sodium heart healthy   Complete by: As directed    Discharge instructions   Complete by: As directed    Take all medications as prescribed. Keep all follow-up appointments as scheduled.  Do not consume alcohol or use illegal drugs while on prescription medications. Report any adverse effects from your medications to your primary care provider promptly.  In the event of recurrent symptoms or worsening symptoms, call 911, a crisis hotline, or go to the nearest emergency department for evaluation.   Increase activity slowly   Complete by: As directed       Allergies as of 09/09/2021       Reactions   Azithromycin Anaphylaxis  Medication List     STOP taking these medications    clindamycin 1 % gel Commonly known as: CLINDAGEL   clindamycin-benzoyl peroxide gel Commonly known as: BENZACLIN   diphenhydrAMINE 25 MG tablet Commonly known as: BENADRYL   doxycycline 100 MG tablet Commonly known as: VIBRA-TABS   DULoxetine 30 MG capsule Commonly known as: CYMBALTA   levothyroxine 50 MCG tablet Commonly known as: SYNTHROID   metformin 1000 MG (OSM) 24 hr tablet Commonly known as: FORTAMET   metFORMIN 1000 MG tablet Commonly known as: GLUCOPHAGE   QUEtiapine 400 MG tablet Commonly known as: SEROQUEL   tretinoin 0.025 % cream Commonly known as: RETIN-A       TAKE these medications      Indication  albuterol 108 (90 Base) MCG/ACT inhaler Commonly known as: VENTOLIN HFA Inhale 1-2 puffs into the lungs every 4 (four) hours as needed for wheezing or shortness of breath.  Indication: Asthma   melatonin 3 MG Tabs tablet Take 2 tablets (6 mg total) by mouth at bedtime.  Indication:  Trouble Sleeping   MELATONIN PO Take 2 tablets by mouth at bedtime as needed (sleep).  Indication: insomnia   naproxen sodium 220 MG tablet Commonly known as: ALEVE Take 440 mg by mouth 2 (two) times daily as needed (pain/headache/pain).  Indication: Pain   Nasal Spray 12 Hour 0.05 % nasal spray Generic drug: oxymetazoline Place 1 spray into both nostrils 2 (two) times daily as needed for congestion.  Indication: Stuffy Nose   pantoprazole 40 MG tablet Commonly known as: PROTONIX Take 1 tablet (40 mg total) by mouth daily.  Indication: Gastroesophageal Reflux Disease   SUMAtriptan 50 MG tablet Commonly known as: IMITREX Take 1 tablet (50 mg total) by mouth every 2 (two) hours as needed for migraine or headache. May repeat in 2 hours if headache persists or recurs.  Indication: Migraine Headache        Follow-up Information     [facility] Follow up.   Why: You may go to this provider for outpatient therapy and medication management services. Contact information: [address]                           [place], [place] [address]  P: [phone]        [facility] Follow up.   Specialty: Behavioral Health Why: You may go to this provider for outpatient therapy and medication management services during walk in hours:  Monday through Wednesday, from 7:45 am to 11:00 am.  Services are provided on a first come, first served basis.  Please arrive early. Contact information: [address] [place] [address] [phone]                Follow-up recommendations:  Activity:  as tolerated Diet:  heart healthy   Comments:  Take all medications as prescribed. Keep all follow-up appointments as scheduled.  Do not consume alcohol or use illegal drugs while on prescription medications. Report any adverse effects from your medications to your primary care provider promptly.  In the event of recurrent symptoms or worsening symptoms, call  911, a crisis hotline, or go to the nearest emergency department for evaluation.    Signed: Oneta Rack, NP 09/09/2021, 10:17 AM

## 2021-09-09 NOTE — Progress Notes (Signed)
  [facility] Adult Case Management Discharge Plan :  Will you be returning to the same living situation after discharge:  Yes,  Home At discharge, do you have transportation home?: Yes,  Safe Transport  Do you have the ability to pay for your medications: Yes,  Family   Release of information consent forms completed and in the chart;  Patient's signature needed at discharge.  Patient to Follow up at:  Follow-up Information     [facility] Follow up.   Why: You may go to this provider for outpatient therapy and medication management services. Contact information: [address]                           [place], [place] [address]  P: [phone]        [facility] Follow up.   Specialty: Behavioral Health Why: You may go to this provider for outpatient therapy and medication management services during walk in hours:  Monday through Wednesday, from 7:45 am to 11:00 am.  Services are provided on a first come, first served basis.  Please arrive early. Contact information: [address] [place] [address] [phone]                Next level of care provider has access to [facility] Link:no  Safety Planning and Suicide Prevention discussed: Yes,  with patient and boyfriend      Has patient been referred to the Quitline?: N/A patient is not a smoker  Patient has been referred for addiction treatment: N/A  [name], LCSWA 09/09/2021, 10:51 AM

## 2021-09-09 NOTE — Progress Notes (Signed)
   09/09/21 1000  Psych Admission Type (Psych Patients Only)  Admission Status Involuntary  Psychosocial Assessment  Patient Complaints None  Eye Contact Fair  Facial Expression Animated  Affect Appropriate to circumstance  Speech Logical/coherent (towards staff)  Interaction Assertive  Motor Activity Other (Comment) (WDL)  Appearance/Hygiene Unremarkable  Behavior Characteristics Cooperative  Mood Depressed  Aggressive Behavior  Effect No apparent injury  Thought Process  Coherency Circumstantial  Content WDL  Delusions None reported or observed  Perception WDL  Hallucination None reported or observed  Judgment Impaired  Confusion WDL  Danger to Self  Current suicidal ideation? Denies  Danger to Others  Danger to Others None reported or observed

## 2021-09-09 NOTE — Progress Notes (Signed)
Pt did not attend group after encouraged to do so, pt states she's tired.

## 2021-09-09 NOTE — Progress Notes (Signed)
Pt discharged to lobby. Pt was stable and appreciative at that time. All papers  were given and valuables returned. Verbal understanding expressed. Denies SI/HI and A/VH. Pt given opportunity to express concerns and ask questions. °
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
## 2021-09-24 ENCOUNTER — Other Ambulatory Visit: Payer: Self-pay

## 2021-09-24 ENCOUNTER — Encounter (HOSPITAL_COMMUNITY): Payer: Self-pay

## 2021-09-24 ENCOUNTER — Emergency Department (HOSPITAL_COMMUNITY)
Admission: EM | Admit: 2021-09-24 | Discharge: 2021-09-24 | Disposition: A | Discharge status: Home / Self Care | Payer: Medicaid Other | Attending: Emergency Medicine | Admitting: Emergency Medicine

## 2021-09-24 DIAGNOSIS — Z5321 Procedure and treatment not carried out due to patient leaving prior to being seen by health care provider: Secondary | ICD-10-CM | POA: Insufficient documentation

## 2021-09-24 DIAGNOSIS — R739 Hyperglycemia, unspecified: Secondary | ICD-10-CM

## 2021-09-24 DIAGNOSIS — F1420 Cocaine dependence, uncomplicated: Secondary | ICD-10-CM | POA: Insufficient documentation

## 2021-09-24 DIAGNOSIS — Z20822 Contact with and (suspected) exposure to covid-19: Secondary | ICD-10-CM | POA: Insufficient documentation

## 2021-09-24 DIAGNOSIS — E039 Hypothyroidism, unspecified: Secondary | ICD-10-CM | POA: Insufficient documentation

## 2021-09-24 DIAGNOSIS — J45909 Unspecified asthma, uncomplicated: Secondary | ICD-10-CM | POA: Insufficient documentation

## 2021-09-24 DIAGNOSIS — F4310 Post-traumatic stress disorder, unspecified: Secondary | ICD-10-CM | POA: Insufficient documentation

## 2021-09-24 DIAGNOSIS — Z79899 Other long term (current) drug therapy: Secondary | ICD-10-CM | POA: Insufficient documentation

## 2021-09-24 DIAGNOSIS — E860 Dehydration: Secondary | ICD-10-CM

## 2021-09-24 DIAGNOSIS — F69 Unspecified disorder of adult personality and behavior: Secondary | ICD-10-CM | POA: Insufficient documentation

## 2021-09-24 DIAGNOSIS — F29 Unspecified psychosis not due to a substance or known physiological condition: Secondary | ICD-10-CM | POA: Insufficient documentation

## 2021-09-24 DIAGNOSIS — E119 Type 2 diabetes mellitus without complications: Secondary | ICD-10-CM | POA: Insufficient documentation

## 2021-09-24 DIAGNOSIS — F333 Major depressive disorder, recurrent, severe with psychotic symptoms: Secondary | ICD-10-CM | POA: Insufficient documentation

## 2021-09-24 LAB — URINALYSIS, ROUTINE W REFLEX MICROSCOPIC
Bacteria, UA: NONE SEEN
Bilirubin Urine: NEGATIVE
Glucose, UA: >=500 mg/dL — AB
Hgb urine dipstick: NEGATIVE
Ketones, ur: 5 mg/dL — AB
Leukocytes,Ua: NEGATIVE
Nitrite: NEGATIVE
Protein, ur: 30 mg/dL — AB
Specific Gravity, Urine: 1.028 (ref 1.005–1.030)
pH: 5 (ref 5.0–8.0)

## 2021-09-24 LAB — BASIC METABOLIC PANEL
Anion gap: 9 (ref 5–15)
BUN: 8 mg/dL (ref 6–20)
CO2: 25 mmol/L (ref 22–32)
Calcium: 9 mg/dL (ref 8.9–10.3)
Chloride: 102 mmol/L (ref 98–111)
Creatinine, Ser: 0.87 mg/dL (ref 0.44–1.00)
GFR, Estimated: >60 mL/min (ref >60)
Glucose, Bld: 189 mg/dL — ABNORMAL HIGH (ref 70–99)
Potassium: 4.2 mmol/L (ref 3.5–5.1)
Sodium: 136 mmol/L (ref 135–145)

## 2021-09-24 LAB — CBC WITH DIFFERENTIAL/PLATELET
Abs Immature Granulocytes: 0.04 10*3/uL (ref 0.00–0.07)
Basophils Absolute: 0 10*3/uL (ref 0.0–0.1)
Basophils Relative: 0 %
Eosinophils Absolute: 0 10*3/uL (ref 0.0–0.5)
Eosinophils Relative: 0 %
HCT: 42.7 % (ref 36.0–46.0)
Hemoglobin: 15.8 g/dL — ABNORMAL HIGH (ref 12.0–15.0)
Immature Granulocytes: 0 %
Lymphocytes Relative: 8 %
Lymphs Abs: 1.1 10*3/uL (ref 0.7–4.0)
MCH: 32 pg (ref 26.0–34.0)
MCHC: 37 g/dL — ABNORMAL HIGH (ref 30.0–36.0)
MCV: 86.6 fL (ref 80.0–100.0)
Monocytes Absolute: 0.5 10*3/uL (ref 0.1–1.0)
Monocytes Relative: 4 %
Neutro Abs: 13 10*3/uL — ABNORMAL HIGH (ref 1.7–7.7)
Neutrophils Relative %: 88 %
Platelets: 444 10*3/uL — ABNORMAL HIGH (ref 150–400)
RBC: 4.93 MIL/uL (ref 3.87–5.11)
RDW: 12.1 % (ref 11.5–15.5)
WBC: 14.7 10*3/uL — ABNORMAL HIGH (ref 4.0–10.5)
nRBC: 0 % (ref 0.0–0.2)

## 2021-09-24 LAB — COMPREHENSIVE METABOLIC PANEL
ALT: 46 U/L — ABNORMAL HIGH (ref 0–44)
AST: 39 U/L (ref 15–41)
Albumin: 4.6 g/dL (ref 3.5–5.0)
Alkaline Phosphatase: 82 U/L (ref 38–126)
Anion gap: 16 — ABNORMAL HIGH (ref 5–15)
BUN: 9 mg/dL (ref 6–20)
CO2: 16 mmol/L — ABNORMAL LOW (ref 22–32)
Calcium: 9.4 mg/dL (ref 8.9–10.3)
Chloride: 99 mmol/L (ref 98–111)
Creatinine, Ser: 1 mg/dL (ref 0.44–1.00)
GFR, Estimated: >60 mL/min (ref >60)
Glucose, Bld: 396 mg/dL — ABNORMAL HIGH (ref 70–99)
Potassium: 3.3 mmol/L — ABNORMAL LOW (ref 3.5–5.1)
Sodium: 131 mmol/L — ABNORMAL LOW (ref 135–145)
Total Bilirubin: 1 mg/dL (ref 0.3–1.2)
Total Protein: 8.5 g/dL — ABNORMAL HIGH (ref 6.5–8.1)

## 2021-09-24 LAB — RAPID URINE DRUG SCREEN, HOSP PERFORMED
Amphetamines: NOT DETECTED
Barbiturates: NOT DETECTED
Benzodiazepines: NOT DETECTED
Cocaine: NOT DETECTED
Opiates: NOT DETECTED
Tetrahydrocannabinol: POSITIVE — AB

## 2021-09-24 LAB — RESP PANEL BY RT-PCR (FLU A&B, COVID) ARPGX2
Influenza A by PCR: NEGATIVE
Influenza B by PCR: NEGATIVE
SARS Coronavirus 2 by RT PCR: NEGATIVE

## 2021-09-24 LAB — ETHANOL: Alcohol, Ethyl (B): <10 mg/dL (ref <10)

## 2021-09-24 LAB — ACETAMINOPHEN LEVEL: Acetaminophen (Tylenol), Serum: <10 ug/mL — ABNORMAL LOW (ref 10–30)

## 2021-09-24 LAB — SALICYLATE LEVEL: Salicylate Lvl: <7 mg/dL — ABNORMAL LOW (ref 7.0–30.0)

## 2021-09-24 MED ORDER — LORAZEPAM 1 MG PO TABS: 1.0000 mg | ORAL_TABLET | Freq: Four times a day (QID) | ORAL | Status: DC | PRN

## 2021-09-24 MED ORDER — ACETAMINOPHEN 325 MG PO TABS
650.0000 mg | ORAL_TABLET | Freq: Four times a day (QID) | ORAL | Status: DC | PRN
Start: 2021-09-24 — End: 2021-09-24

## 2021-09-24 MED ORDER — LACTATED RINGERS IV BOLUS
2000.0000 mL | Freq: Once | INTRAVENOUS | Status: AC
  Administered 2021-09-24: 2000 mL via INTRAVENOUS

## 2021-09-24 MED ORDER — LORAZEPAM 1 MG PO TABS
2.0000 mg | ORAL_TABLET | Freq: Once | ORAL | Status: AC
  Administered 2021-09-24: 2 mg via ORAL
  Filled 2021-09-24: qty 2
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
## 2021-09-24 NOTE — Discharge Instructions (Signed)
Please continue to take medications as directed. If your symptoms return, worsen, or persist please call your 911, report to local ER, or contact crisis hotline. Please do not drink alcohol or use any illegal substances while taking prescription medications.    PLEASE AVOID AND REFRAIN FROM USE OF ILLICT SUBSTANCES TO INCLUDE CANNABIS, CANNABINOIDS, AND ALL DELTA (cbd) PRODUCTS.   Remember that each time you use illicit substances your psychosis can return. Each state of psychosis is detrimental to your brain cells, making the condition less likely reversible.

## 2021-09-24 NOTE — ED Provider Notes (Signed)
Emergency Medicine Observation Re-evaluation Note  Erika Rhodes is a 40 y.o. female, seen on rounds today.  Pt initially presented to the ED for complaints of IVC  Currently, the patient is alert, and calm.  Physical Exam  BP (!) 136/99   Pulse (!) 112   Temp 98.4 F (36.9 C) (Oral)   Resp 18   Ht 5' 3.5" (1.613 m)   Wt 88.5 kg   SpO2 98%   BMI 34.00 kg/m  Physical Exam General: Nontoxic appearance Cardiac: Tachycardic Lungs: Normal respiratory rate Psych: Not responding to internal stimuli  ED Course / MDM  EKG:EKG Interpretation  Date/Time:  Wednesday September 24 2021 03:25:05 EST Ventricular Rate:  116 PR Interval:  143 QRS Duration: 90 QT Interval:  343 QTC Calculation: 477 R Axis:   -2 Text Interpretation: Sinus tachycardia Anterior infarct, old Confirmed by Zadie Rhine ([id number]) on 09/24/2021 3:31:31 AM  I have reviewed the labs performed to date as well as medications administered while in observation.  Recent changes in the last 24 hours include she has been cooperative.  Seen by TTS today, who cleared her and made arrangements for outpatient follow-up at [facility].  Plan  Current plan is for discharge.  Erika Rhodes is not under involuntary commitment.     Erika Bale, MD 09/24/21 1153

## 2021-09-24 NOTE — Progress Notes (Signed)
Inpatient Diabetes Program Recommendations  AACE/ADA: New Consensus Statement on Inpatient Glycemic Control   Target Ranges:  Prepandial:   less than 140 mg/dL      Peak postprandial:   less than 180 mg/dL (1-2 hours)      Critically ill patients:  140 - 180 mg/dL   Results for JACLIN, FINKS (MRN [id number]) as of 09/24/2021 10:07  Ref. Range 09/24/2021 01:22 09/24/2021 06:18  Glucose Latest Ref Range: 70 - 99 mg/dL 088 (H) 110 (H)    Review of Glycemic Control  Diabetes history: DM2 Outpatient Diabetes medications: None Current orders for Inpatient glycemic control: None  Inpatient Diabetes Program Recommendations:    Insulin: Please consider ordering CBGs AC&HS and Novolog 0-9 units TID with meals and Novolog 0-5 units QHS.  Thanks, Orlando Penner, RN, MSN, CDE Diabetes Coordinator Inpatient Diabetes Program [phone] (Team Pager from 8am to 5pm)

## 2021-09-24 NOTE — BH Assessment (Signed)
Clinician sent a secure internal message to pt's provider and nurse at 0230 requesting the Tele-Assessment machine be moved into pt's room to complete her MH Assessment. Pt's provider, Dr. Bebe Shaggy, returned a message stating pt is not medically cleared at this time. TTS to attempt assessment at a later time once pt is medically cleared.

## 2021-09-24 NOTE — Progress Notes (Signed)
CSW received consult for transportation. CSW scheduled ride for pt  [employer] , driver name is amber. Ride should arrive between 12:06pm -12:25pm.   Valentina Shaggy.Guiliana Shor, MSW, LCSWA [facility]  Transitions of Care Clinical Social Worker I Direct Dial: [phone]  Fax: [phone] [email or web address]

## 2021-09-24 NOTE — ED Notes (Signed)
Pt walked out of the facility saying that she had to leave and that everything was fine.
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
## 2021-09-24 NOTE — Consult Note (Signed)
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 Pt denies depression, anxiety, irritability, hallucinations, and is linear, coherent, with improved affect this morning compared to day prior.  Apparently she had a previous admission at [facility] about 3 weeks ago similar presentation, except her psychosis was more apparent.  I explored effects of substances on her  mental health with her. She states she has been using delta 10 products.  Patient is in denial about the negative effects, seemed to think it was the Cymbalta she started 2 days ago. She appears motivated to stay sober and refrain from illicit substances at this time.  Risks especially on effects of substances with respect to psychosis and paranoia was discussed with her. Effects of substances seems to have worn off. Her mood has dramatically improved. Patient is able to hold a linear conversation. She does not appear to be responding to internal stimuli, external stimuli, or delusional thought disorder. At this time she denies any psychosis, paranoia, or otherwise acute psychiatric symptoms. Patient is no longer expressing any suicidal thoughts. She is engaging more.  She does not seem motivated to engage in any relapse preventive measure at this time, although resources have been offered and provided. She is no longer considered a danger to herself, and IVC will be rescinded.   Patient will be psychiatrically cleared at this time. She is encouraged to refrain from all Cannabinoids products.

## 2021-10-01 ENCOUNTER — Encounter (HOSPITAL_COMMUNITY): Payer: Self-pay

## 2021-10-01 ENCOUNTER — Emergency Department (HOSPITAL_COMMUNITY)
Admission: EM | Admit: 2021-10-01 | Discharge: 2021-10-02 | Disposition: A | Discharge status: Other Acute Inpatient Hospital | Payer: Medicaid Other | Attending: Emergency Medicine | Admitting: Emergency Medicine

## 2021-10-01 DIAGNOSIS — E119 Type 2 diabetes mellitus without complications: Secondary | ICD-10-CM | POA: Insufficient documentation

## 2021-10-01 DIAGNOSIS — Z20822 Contact with and (suspected) exposure to covid-19: Secondary | ICD-10-CM | POA: Insufficient documentation

## 2021-10-01 DIAGNOSIS — Y900 Blood alcohol level of less than 20 mg/100 ml: Secondary | ICD-10-CM | POA: Insufficient documentation

## 2021-10-01 DIAGNOSIS — Z79899 Other long term (current) drug therapy: Secondary | ICD-10-CM | POA: Insufficient documentation

## 2021-10-01 DIAGNOSIS — Z794 Long term (current) use of insulin: Secondary | ICD-10-CM | POA: Insufficient documentation

## 2021-10-01 DIAGNOSIS — E039 Hypothyroidism, unspecified: Secondary | ICD-10-CM | POA: Insufficient documentation

## 2021-10-01 DIAGNOSIS — F19959 Other psychoactive substance use, unspecified with psychoactive substance-induced psychotic disorder, unspecified: Secondary | ICD-10-CM | POA: Insufficient documentation

## 2021-10-01 DIAGNOSIS — J45909 Unspecified asthma, uncomplicated: Secondary | ICD-10-CM | POA: Insufficient documentation

## 2021-10-01 DIAGNOSIS — F309 Manic episode, unspecified: Secondary | ICD-10-CM

## 2021-10-01 LAB — COMPREHENSIVE METABOLIC PANEL
ALT: 57 U/L — ABNORMAL HIGH (ref 0–44)
AST: 48 U/L — ABNORMAL HIGH (ref 15–41)
Albumin: 4.4 g/dL (ref 3.5–5.0)
Alkaline Phosphatase: 77 U/L (ref 38–126)
Anion gap: 8 (ref 5–15)
BUN: 16 mg/dL (ref 6–20)
CO2: 27 mmol/L (ref 22–32)
Calcium: 9.2 mg/dL (ref 8.9–10.3)
Chloride: 104 mmol/L (ref 98–111)
Creatinine, Ser: 0.91 mg/dL (ref 0.44–1.00)
GFR, Estimated: >60 mL/min (ref >60)
Glucose, Bld: 191 mg/dL — ABNORMAL HIGH (ref 70–99)
Potassium: 3.8 mmol/L (ref 3.5–5.1)
Sodium: 139 mmol/L (ref 135–145)
Total Bilirubin: 1.2 mg/dL (ref 0.3–1.2)
Total Protein: 7.8 g/dL (ref 6.5–8.1)

## 2021-10-01 LAB — URINALYSIS, ROUTINE W REFLEX MICROSCOPIC
Bilirubin Urine: NEGATIVE
Glucose, UA: NEGATIVE mg/dL
Hgb urine dipstick: NEGATIVE
Ketones, ur: NEGATIVE mg/dL
Leukocytes,Ua: NEGATIVE
Nitrite: NEGATIVE
Protein, ur: NEGATIVE mg/dL
Specific Gravity, Urine: 1.02 (ref 1.005–1.030)
pH: 5 (ref 5.0–8.0)

## 2021-10-01 LAB — CBC WITH DIFFERENTIAL/PLATELET
Abs Immature Granulocytes: 0.02 10*3/uL (ref 0.00–0.07)
Basophils Absolute: 0 10*3/uL (ref 0.0–0.1)
Basophils Relative: 0 %
Eosinophils Absolute: 0.1 10*3/uL (ref 0.0–0.5)
Eosinophils Relative: 1 %
HCT: 44.5 % (ref 36.0–46.0)
Hemoglobin: 15.7 g/dL — ABNORMAL HIGH (ref 12.0–15.0)
Immature Granulocytes: 0 %
Lymphocytes Relative: 28 %
Lymphs Abs: 2.2 10*3/uL (ref 0.7–4.0)
MCH: 32 pg (ref 26.0–34.0)
MCHC: 35.3 g/dL (ref 30.0–36.0)
MCV: 90.6 fL (ref 80.0–100.0)
Monocytes Absolute: 0.4 10*3/uL (ref 0.1–1.0)
Monocytes Relative: 5 %
Neutro Abs: 5.1 10*3/uL (ref 1.7–7.7)
Neutrophils Relative %: 66 %
Platelets: 316 10*3/uL (ref 150–400)
RBC: 4.91 MIL/uL (ref 3.87–5.11)
RDW: 12.1 % (ref 11.5–15.5)
WBC: 7.9 10*3/uL (ref 4.0–10.5)
nRBC: 0 % (ref 0.0–0.2)

## 2021-10-01 LAB — SALICYLATE LEVEL: Salicylate Lvl: <7 mg/dL — ABNORMAL LOW (ref 7.0–30.0)

## 2021-10-01 LAB — ACETAMINOPHEN LEVEL: Acetaminophen (Tylenol), Serum: <10 ug/mL — ABNORMAL LOW (ref 10–30)

## 2021-10-01 LAB — ETHANOL: Alcohol, Ethyl (B): <10 mg/dL (ref <10)

## 2021-10-01 MED ORDER — ACETAMINOPHEN 325 MG PO TABS
650.0000 mg | ORAL_TABLET | Freq: Once | ORAL | Status: AC
  Administered 2021-10-01: 650 mg via ORAL
  Filled 2021-10-01: qty 2

## 2021-10-01 MED ORDER — HYDROXYZINE HCL 25 MG PO TABS
25.0000 mg | ORAL_TABLET | Freq: Once | ORAL | Status: DC
  Filled 2021-10-01: qty 1

## 2021-10-01 MED ORDER — LORAZEPAM 1 MG PO TABS
1.0000 mg | ORAL_TABLET | Freq: Once | ORAL | Status: AC
  Administered 2021-10-01: 1 mg via ORAL
  Filled 2021-10-01: qty 1
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
## 2021-10-01 NOTE — ED Notes (Signed)
Pt changed into burgundy top, there are no burgundy bottoms available at this time. Doroteo Glassman, Charge RN aware. Safety sitter bedside.
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
## 2021-10-01 NOTE — Progress Notes (Signed)
Inpatient Behavioral Health placement  Pt meets inpatient criteria per Heinz Knuckles NP. There are no appropriate beds at [facility] per [facility] Rosezetta Schlatter, RN. Referral was sent to the following facilities;   Destination Service Provider Address Phone Fax  [facility]  [address], [place] [address] [phone]  [facility] [address]  [address], [place] [address] [phone]  [facility]  [address] [place], [place] [address] [phone]  [facility]  [address] [place] [address] [phone]  [facility]  [phone] [place] [address] [phone]  [facility]  [address] [place] [address] [phone]  [facility]  [address], [place] [address] [phone]  [facility]  [address] [place] [address] [phone]  [facility]  [address], [place] [address] [phone]  [facility]  [address] [place] [address] [phone]  [facility]  [address], [place] [address] [phone]  [facility]  [address], [place] [address] [phone]  [facility]  [address] [place] [address] [phone]  [facility]  [address] [place] [address] [phone]  [facility]  [address], [place] [address] [phone]    Situation ongoing,  CSW will follow up.   Maryjean Ka, MSW, [facility] 10/01/2021  @ 11:27 PM
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
## 2021-10-02 ENCOUNTER — Inpatient Hospital Stay (HOSPITAL_COMMUNITY)
Admission: AD | Admit: 2021-10-02 | Discharge: 2021-10-10 | DRG: 885 | Disposition: A | Discharge status: Home / Self Care | Payer: Federal, State, Local not specified - Other | Source: Intra-hospital | Attending: Psychiatry | Admitting: Psychiatry

## 2021-10-02 ENCOUNTER — Encounter (HOSPITAL_COMMUNITY): Payer: Self-pay | Admitting: Psychiatry

## 2021-10-02 ENCOUNTER — Other Ambulatory Visit: Payer: Self-pay

## 2021-10-02 DIAGNOSIS — Z79899 Other long term (current) drug therapy: Secondary | ICD-10-CM

## 2021-10-02 DIAGNOSIS — G249 Dystonia, unspecified: Secondary | ICD-10-CM | POA: Diagnosis present

## 2021-10-02 DIAGNOSIS — Z818 Family history of other mental and behavioral disorders: Secondary | ICD-10-CM | POA: Diagnosis not present

## 2021-10-02 DIAGNOSIS — Z6833 Body mass index (BMI) 33.0-33.9, adult: Secondary | ICD-10-CM | POA: Diagnosis not present

## 2021-10-02 DIAGNOSIS — E119 Type 2 diabetes mellitus without complications: Secondary | ICD-10-CM | POA: Diagnosis present

## 2021-10-02 DIAGNOSIS — Z8261 Family history of arthritis: Secondary | ICD-10-CM | POA: Diagnosis not present

## 2021-10-02 DIAGNOSIS — K5900 Constipation, unspecified: Secondary | ICD-10-CM | POA: Diagnosis present

## 2021-10-02 DIAGNOSIS — Z560 Unemployment, unspecified: Secondary | ICD-10-CM | POA: Diagnosis not present

## 2021-10-02 DIAGNOSIS — F17290 Nicotine dependence, other tobacco product, uncomplicated: Secondary | ICD-10-CM | POA: Diagnosis present

## 2021-10-02 DIAGNOSIS — K219 Gastro-esophageal reflux disease without esophagitis: Secondary | ICD-10-CM | POA: Diagnosis present

## 2021-10-02 DIAGNOSIS — F1220 Cannabis dependence, uncomplicated: Secondary | ICD-10-CM | POA: Diagnosis present

## 2021-10-02 DIAGNOSIS — J45909 Unspecified asthma, uncomplicated: Secondary | ICD-10-CM | POA: Diagnosis present

## 2021-10-02 DIAGNOSIS — M069 Rheumatoid arthritis, unspecified: Secondary | ICD-10-CM | POA: Diagnosis present

## 2021-10-02 DIAGNOSIS — F609 Personality disorder, unspecified: Secondary | ICD-10-CM | POA: Diagnosis present

## 2021-10-02 DIAGNOSIS — F319 Bipolar disorder, unspecified: Secondary | ICD-10-CM | POA: Diagnosis present

## 2021-10-02 DIAGNOSIS — R630 Anorexia: Secondary | ICD-10-CM | POA: Diagnosis present

## 2021-10-02 DIAGNOSIS — F6089 Other specific personality disorders: Secondary | ICD-10-CM | POA: Diagnosis present

## 2021-10-02 DIAGNOSIS — Z9151 Personal history of suicidal behavior: Secondary | ICD-10-CM

## 2021-10-02 DIAGNOSIS — Z91199 Patient's noncompliance with other medical treatment and regimen due to unspecified reason: Secondary | ICD-10-CM

## 2021-10-02 DIAGNOSIS — F22 Delusional disorders: Secondary | ICD-10-CM | POA: Diagnosis present

## 2021-10-02 DIAGNOSIS — Z653 Problems related to other legal circumstances: Secondary | ICD-10-CM

## 2021-10-02 DIAGNOSIS — F4310 Post-traumatic stress disorder, unspecified: Secondary | ICD-10-CM | POA: Diagnosis present

## 2021-10-02 DIAGNOSIS — Z7984 Long term (current) use of oral hypoglycemic drugs: Secondary | ICD-10-CM | POA: Diagnosis not present

## 2021-10-02 DIAGNOSIS — F339 Major depressive disorder, recurrent, unspecified: Secondary | ICD-10-CM | POA: Diagnosis present

## 2021-10-02 DIAGNOSIS — R0981 Nasal congestion: Secondary | ICD-10-CM | POA: Diagnosis present

## 2021-10-02 DIAGNOSIS — E1165 Type 2 diabetes mellitus with hyperglycemia: Secondary | ICD-10-CM

## 2021-10-02 DIAGNOSIS — Z5320 Procedure and treatment not carried out because of patient's decision for unspecified reasons: Secondary | ICD-10-CM | POA: Diagnosis present

## 2021-10-02 DIAGNOSIS — F29 Unspecified psychosis not due to a substance or known physiological condition: Secondary | ICD-10-CM

## 2021-10-02 LAB — RESP PANEL BY RT-PCR (FLU A&B, COVID) ARPGX2
Influenza A by PCR: NEGATIVE
Influenza B by PCR: NEGATIVE
SARS Coronavirus 2 by RT PCR: NEGATIVE

## 2021-10-02 MED ORDER — LORAZEPAM 2 MG/ML IJ SOLN
INTRAMUSCULAR | Status: AC
  Filled 2021-10-02: qty 1

## 2021-10-02 MED ORDER — NICOTINE 21 MG/24HR TD PT24
21.0000 mg | MEDICATED_PATCH | Freq: Every day | TRANSDERMAL | Status: DC
  Filled 2021-10-02 (×5): qty 1

## 2021-10-02 MED ORDER — LORAZEPAM 1 MG PO TABS: 1.0000 mg | ORAL_TABLET | ORAL | Status: DC | PRN

## 2021-10-02 MED ORDER — ALUM & MAG HYDROXIDE-SIMETH 200-200-20 MG/5ML PO SUSP
30.0000 mL | ORAL | Status: DC | PRN
  Administered 2021-10-02 – 2021-10-06 (×3): 30 mL via ORAL
  Filled 2021-10-02 (×3): qty 30

## 2021-10-02 MED ORDER — MAGNESIUM HYDROXIDE 400 MG/5ML PO SUSP: 30.0000 mL | Freq: Every day | ORAL | Status: DC | PRN

## 2021-10-02 MED ORDER — LORAZEPAM 2 MG/ML IJ SOLN: 2.0000 mg | Freq: Once | INTRAMUSCULAR | Status: AC

## 2021-10-02 MED ORDER — LORAZEPAM 1 MG PO TABS
2.0000 mg | ORAL_TABLET | Freq: Once | ORAL | Status: AC
  Administered 2021-10-02: 2 mg via ORAL

## 2021-10-02 MED ORDER — LORAZEPAM 1 MG PO TABS
1.0000 mg | ORAL_TABLET | Freq: Four times a day (QID) | ORAL | Status: DC | PRN
  Administered 2021-10-02: 1 mg via ORAL
  Filled 2021-10-02: qty 1

## 2021-10-02 MED ORDER — HYDROXYZINE HCL 25 MG PO TABS
25.0000 mg | ORAL_TABLET | Freq: Three times a day (TID) | ORAL | Status: DC | PRN
  Administered 2021-10-02 – 2021-10-10 (×10): 25 mg via ORAL
  Filled 2021-10-02 (×8): qty 1
  Filled 2021-10-02: qty 10
  Filled 2021-10-02: qty 1

## 2021-10-02 MED ORDER — OLANZAPINE 5 MG PO TBDP: 5.0000 mg | ORAL_TABLET | Freq: Three times a day (TID) | ORAL | Status: DC | PRN

## 2021-10-02 MED ORDER — ACETAMINOPHEN 325 MG PO TABS
650.0000 mg | ORAL_TABLET | Freq: Four times a day (QID) | ORAL | Status: DC | PRN
  Administered 2021-10-04 – 2021-10-10 (×6): 650 mg via ORAL
  Filled 2021-10-02 (×7): qty 2

## 2021-10-02 MED ORDER — TRAZODONE HCL 50 MG PO TABS
50.0000 mg | ORAL_TABLET | Freq: Every evening | ORAL | Status: DC | PRN
  Administered 2021-10-03 – 2021-10-09 (×7): 50 mg via ORAL
  Filled 2021-10-02 (×4): qty 1
  Filled 2021-10-02: qty 7
  Filled 2021-10-02 (×2): qty 1

## 2021-10-02 MED ORDER — ZIPRASIDONE MESYLATE 20 MG IM SOLR: 20.0000 mg | INTRAMUSCULAR | Status: DC | PRN

## 2021-10-02 NOTE — [facility] Group Notes (Signed)
Pt did not participate in Psycho-Ed group this afternoon.
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
## 2021-10-02 NOTE — ED Notes (Signed)
Pt requesting anxiety meds. MD aware.
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
## 2021-10-02 NOTE — Tx Team (Signed)
Initial Treatment Plan 10/02/2021 1:09 PM [name] [id number]    PATIENT STRESSORS: Marital or family conflict   Medication change or noncompliance     PATIENT STRENGTHS: Active sense of humor  Physical Health    PATIENT IDENTIFIED PROBLEMS: Anxiety                       DISCHARGE CRITERIA:  Ability to meet basic life and health needs Adequate post-discharge living arrangements Improved stabilization in mood, thinking, and/or behavior Reduction of life-threatening or endangering symptoms to within safe limits  PRELIMINARY DISCHARGE PLAN: Return to previous living arrangement  PATIENT/FAMILY INVOLVEMENT: This treatment plan has been presented to and reviewed with the patient, Erika Rhodes.  The patient has been given the opportunity to ask questions and make suggestions.  Wardell Heath, RN 10/02/2021, 1:09 PM

## 2021-10-02 NOTE — ED Notes (Signed)
Patient sitting up eating breakfast.

## 2021-10-02 NOTE — ED Notes (Signed)
[facility] contacted for transport to [facility] 

## 2021-10-02 NOTE — ED Notes (Signed)
[employer] at bedside - transporting pt to [facility]
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
## 2021-10-02 NOTE — ED Notes (Signed)
PT sleeping, no apparent distress
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
## 2021-10-02 NOTE — ED Notes (Addendum)
This RN contacted [facility] for information regarding patient being able to come to facility. RN at [facility] states they have not heard anything about this pt. [facility] AC stated they would discuss pt in bed meeting and let us know.

## 2021-10-02 NOTE — ED Notes (Signed)
Pt asking for update on POC. RN gave update on search for facility. Pt calm and cooperative.

## 2021-10-02 NOTE — Progress Notes (Signed)
Placement is pending at [facility] due to Flu and Covid test results. RN's have been messaged accordingly.

## 2021-10-02 NOTE — Progress Notes (Signed)
CSW was advised that pt had been offered a bed per [facility] [employer], RN. Pt has now been assigned to [facility] 403-1.   Maryjean Ka, MSW, [facility] 10/02/2021 3:41 PM

## 2021-10-02 NOTE — Progress Notes (Signed)
   10/02/21 1930  Psych Admission Type (Psych Patients Only)  Admission Status Involuntary  Psychosocial Assessment  Patient Complaints Anger;Irritability;Suspiciousness;Hyperactivity;Agitation  Eye Contact Fair  Facial Expression Angry  Affect Angry;Irritable;Labile  Speech Tangential;Aggressive;Argumentative  Interaction Attention-seeking;Dominating;Hostile  Motor Activity Pacing;Hyperactive  Appearance/Hygiene Disheveled  Behavior Characteristics Agressive verbally;Aggressive physically;Agitated;Impulsive;Intrusive;Irritable  Mood Labile;Angry;Irritable;Threatening  Thought Process  Coherency Circumstantial  Content WDL  Delusions None reported or observed  Perception WDL  Hallucination None reported or observed  Judgment Impaired  Confusion WDL  Danger to Self  Current suicidal ideation? Denies  Danger to Others  Danger to Others Reported or observed  Danger to Others Abnormal  Harmful Behavior to others No threats or harm toward other people  Destructive Behavior Acts of violence toward property observed   Description of Destructive Behavior Throwing supper trays around the unit

## 2021-10-02 NOTE — ED Notes (Signed)
PRN ativan given.
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
## 2021-10-03 ENCOUNTER — Encounter (HOSPITAL_COMMUNITY): Payer: Self-pay

## 2021-10-03 DIAGNOSIS — F319 Bipolar disorder, unspecified: Secondary | ICD-10-CM | POA: Diagnosis present

## 2021-10-03 LAB — HCG, QUANTITATIVE, PREGNANCY: hCG, Beta Chain, Quant, S: 1 m[IU]/mL (ref <5)

## 2021-10-03 MED ORDER — TRAZODONE HCL 50 MG PO TABS
50.0000 mg | ORAL_TABLET | Freq: Once | ORAL | Status: DC
  Filled 2021-10-03 (×2): qty 1

## 2021-10-03 MED ORDER — ALBUTEROL SULFATE HFA 108 (90 BASE) MCG/ACT IN AERS: 1.0000 | INHALATION_SPRAY | RESPIRATORY_TRACT | Status: DC | PRN

## 2021-10-03 MED ORDER — LORAZEPAM 1 MG PO TABS
1.0000 mg | ORAL_TABLET | ORAL | Status: AC | PRN
  Administered 2021-10-03: 1 mg via ORAL
  Filled 2021-10-03: qty 1

## 2021-10-03 MED ORDER — ZIPRASIDONE MESYLATE 20 MG IM SOLR: 20.0000 mg | INTRAMUSCULAR | Status: DC | PRN

## 2021-10-03 MED ORDER — OLANZAPINE 5 MG PO TBDP
5.0000 mg | ORAL_TABLET | Freq: Three times a day (TID) | ORAL | Status: DC | PRN
  Administered 2021-10-04 – 2021-10-08 (×4): 5 mg via ORAL
  Filled 2021-10-03 (×4): qty 1

## 2021-10-03 MED ORDER — INSULIN ASPART 100 UNIT/ML IJ SOLN
0.0000 [IU] | Freq: Three times a day (TID) | INTRAMUSCULAR | Status: DC
  Administered 2021-10-05: 17:00:00 3 [IU] via SUBCUTANEOUS
  Administered 2021-10-05: 12:00:00 2 [IU] via SUBCUTANEOUS

## 2021-10-03 MED ORDER — ZIPRASIDONE HCL 20 MG PO CAPS
20.0000 mg | ORAL_CAPSULE | Freq: Two times a day (BID) | ORAL | Status: DC
  Administered 2021-10-03: 20 mg via ORAL
  Filled 2021-10-03 (×5): qty 1

## 2021-10-03 NOTE — [facility] Suicide Risk Assessment (Addendum)
[facility] INPATIENT:  Family/Significant Other Suicide Prevention Education  Suicide Prevention Education:  Patient Refusal for Family/Significant Other Suicide Prevention Education: The patient Erika Rhodes has refused to provide written consent for family/significant other to be provided Family/Significant Other Suicide Prevention Education during admission and/or prior to discharge.  Physician notified.   Paitnent states that CSW can contact Garen Grams, partner at [phone]. However, patient is unwilling to sign any documentation as she is under involuntary commitment. CSW will not contact collateral until written consent is obtained.   Corky Crafts 10/03/2021, 4:17 PM

## 2021-10-03 NOTE — Progress Notes (Signed)
[name], patient, has DECLINED to provide written consent for CSW team to coordinate aftercare at this time. CSW to have further conversations with patient regarding care in the community.   Signed:  Corky Crafts, MSW, [place], LCASA 10/03/2021 4:20 PM

## 2021-10-03 NOTE — Progress Notes (Signed)
Pt refused am CBG and vital signs.

## 2021-10-03 NOTE — BH IP Treatment Plan (Signed)
Interdisciplinary Treatment and Diagnostic Plan Update  10/03/2021 Time of Session:  Erika Rhodes MRN: [id number]  Principal Diagnosis: Bipolar I disorder ([place])  Secondary Diagnoses: Principal Problem:   Bipolar I disorder ([place]) Active Problems:   PTSD (post-traumatic stress disorder)   Psychosis ([place])   GERD (gastroesophageal reflux disease)   Current Medications:  Current Facility-Administered Medications  Medication Dose Route Frequency Provider Last Rate Last Admin   acetaminophen (TYLENOL) tablet 650 mg  650 mg Oral Q6H PRN Nwoko, Herbert Pun I, NP       alum & mag hydroxide-simeth (MAALOX/MYLANTA) 200-200-20 MG/5ML suspension 30 mL  30 mL Oral Q4H PRN Lindell Spar I, NP   30 mL at 10/02/21 1501   hydrOXYzine (ATARAX/VISTARIL) tablet 25 mg  25 mg Oral TID PRN Lindell Spar I, NP   25 mg at 10/03/21 0953   OLANZapine zydis (ZYPREXA) disintegrating tablet 5 mg  5 mg Oral Q8H PRN Massengill, Ovid Curd, MD       And   LORazepam (ATIVAN) tablet 1 mg  1 mg Oral PRN Massengill, Ovid Curd, MD       And   ziprasidone (GEODON) injection 20 mg  20 mg Intramuscular PRN Massengill, Ovid Curd, MD       magnesium hydroxide (MILK OF MAGNESIA) suspension 30 mL  30 mL Oral Daily PRN Nwoko, Agnes I, NP       nicotine (NICODERM CQ - dosed in mg/24 hours) patch 21 mg  21 mg Transdermal Q0600 Nwoko, Herbert Pun I, NP       traZODone (DESYREL) tablet 50 mg  50 mg Oral QHS PRN Nwoko, Agnes I, NP       ziprasidone (GEODON) capsule 20 mg  20 mg Oral BID WC Armando Reichert, MD       PTA Medications: Medications Prior to Admission  Medication Sig Dispense Refill Last Dose   albuterol (VENTOLIN HFA) 108 (90 Base) MCG/ACT inhaler Inhale 1-2 puffs into the lungs every 4 (four) hours as needed for wheezing or shortness of breath. 1 each 0    doxycycline (VIBRA-TABS) 100 MG tablet Take 100 mg by mouth 2 (two) times daily.      melatonin 3 MG TABS tablet Take 2 tablets (6 mg total) by mouth at bedtime. 20 tablet 0     oxymetazoline (NASAL SPRAY 12 HOUR) 0.05 % nasal spray Place 1 spray into both nostrils 2 (two) times daily as needed for congestion.       Patient Stressors: Marital or family conflict   Medication change or noncompliance    Patient Strengths: Active sense of humor  Physical Health   Treatment Modalities: Medication Management, Group therapy, Case management,  1 to 1 session with clinician, Psychoeducation, Recreational therapy.   Physician Treatment Plan for Primary Diagnosis: Bipolar I disorder ([place]) Long Term Goal(s):     Short Term Goals:    Medication Management: Evaluate patient's response, side effects, and tolerance of medication regimen.  Therapeutic Interventions: 1 to 1 sessions, Unit Group sessions and Medication administration.  Evaluation of Outcomes: Not Met  Physician Treatment Plan for Secondary Diagnosis: Principal Problem:   Bipolar I disorder ([place]) Active Problems:   PTSD (post-traumatic stress disorder)   Psychosis ([place])   GERD (gastroesophageal reflux disease)  Long Term Goal(s):     Short Term Goals:       Medication Management: Evaluate patient's response, side effects, and tolerance of medication regimen.  Therapeutic Interventions: 1 to 1 sessions, Unit Group sessions and Medication administration.  Evaluation of Outcomes: Not  Met   RN Treatment Plan for Primary Diagnosis: Bipolar I disorder ([place]) Long Term Goal(s): Knowledge of disease and therapeutic regimen to maintain health will improve  Short Term Goals: Ability to participate in decision making will improve, Ability to verbalize feelings will improve, and Ability to identify and develop effective coping behaviors will improve  Medication Management: RN will administer medications as ordered by provider, will assess and evaluate patient's response and provide education to patient for prescribed medication. RN will report any adverse and/or side effects to prescribing  provider.  Therapeutic Interventions: 1 on 1 counseling sessions, Psychoeducation, Medication administration, Evaluate responses to treatment, Monitor vital signs and CBGs as ordered, Perform/monitor CIWA, COWS, AIMS and Fall Risk screenings as ordered, Perform wound care treatments as ordered.  Evaluation of Outcomes: Not Met   [name] Treatment Plan for Primary Diagnosis: Bipolar I disorder ([place]) Long Term Goal(s): Safe transition to appropriate next level of care at discharge, Engage patient in therapeutic group addressing interpersonal concerns.  Short Term Goals: Engage patient in aftercare planning with referrals and resources, Increase ability to appropriately verbalize feelings, and Increase emotional regulation  Therapeutic Interventions: Assess for all discharge needs, 1 to 1 time with Social worker, Explore available resources and support systems, Assess for adequacy in community support network, Educate family and significant other(s) on suicide prevention, Complete Psychosocial Assessment, Interpersonal group therapy.  Evaluation of Outcomes: Not Met   Progress in Treatment: Attending groups: No. Participating in groups: No. Taking medication as prescribed: Yes. Toleration medication: Yes. Family/Significant other contact made: No, will contact:  CSW will obtain consents Patient understands diagnosis: No. Discussing patient identified problems/goals with staff: Yes. Medical problems stabilized or resolved: Yes. Denies suicidal/homicidal ideation: Yes. Issues/concerns per patient self-inventory: No. Other: None  New problem(s) identified: No, Describe:  None  New Short Term/Long Term Goal(s):medication stabilization, elimination of SI thoughts, development of comprehensive mental wellness plan.   Patient Goals:  "getting out"  Discharge Plan or Barriers: Patient recently admitted. CSW will continue to follow and assess for appropriate referrals and possible discharge  planning.   Reason for Continuation of Hospitalization: Anxiety Mania Medication stabilization  Estimated Length of Stay: 3-5 days   Scribe for Treatment Team: Eliott Nine 10/03/2021 2:43 PM

## 2021-10-03 NOTE — Progress Notes (Signed)
As of this time, Erika Rhodes has slept the entire night.  No episodes of property destruction or verbal aggression towards staff.

## 2021-10-03 NOTE — Group Note (Signed)
[name] Group Therapy Note  Group Date: 10/03/2021 Start Time: 1300 End Time: 1400   Type of Therapy and Topic:  Group Therapy - Healthy vs Unhealthy Coping Skills  Participation Level:  Active   Description of Group The focus of this group was to determine what unhealthy coping techniques typically are used by group members and what healthy coping techniques would be helpful in coping with various problems. Patients were guided in becoming aware of the differences between healthy and unhealthy coping techniques. Patients were asked to identify 2-3 healthy coping skills they would like to learn to use more effectively.  Therapeutic Goals Patients learned that coping is what human beings do all day long to deal with various situations in their lives Patients defined and discussed healthy vs unhealthy coping techniques Patients identified their preferred coping techniques and identified whether these were healthy or unhealthy Patients determined 2-3 healthy coping skills they would like to become more familiar with and use more often. Patients provided support and ideas to each other   Therapeutic Modalities Cognitive Behavioral Therapy Motivational Interviewing  Mazey Mantell M Daphene Chisholm, LCSWA 10/03/2021  1:52 PM   

## 2021-10-03 NOTE — Progress Notes (Signed)
Patient did attend the evening speaker AA meeting.  

## 2021-10-03 NOTE — BHH Group Notes (Signed)
Pt attended morning goals group and morning psycho- ed group. But had no participation.
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
## 2021-10-03 NOTE — BHH Group Notes (Signed)
Pt attended Psycho-Ed group this afternoon.  
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
## 2021-10-03 NOTE — Progress Notes (Signed)
Patient has been irritable and verbally aggressive toward staff this shift.  Patient stated "If you don't get me something stronger than this, I will show you what a good time is!  I am going to do what I have to do to get what I want even if I have to fuck you up!"  Patient was redirected by staff and boundaries were set.  Patient has continued to be threatening toward staff and demanding medications.   Assess patient for safety, offer medications as prescribed, engage patient in 1:1 staff talks.   Continue to monitor patient as planned.  Patient safety has been maintained.

## 2021-10-03 NOTE — [facility] Group Notes (Signed)
Pt attended Relaxation Group this afternoon and had a great time listening to music .
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
## 2021-10-04 DIAGNOSIS — F319 Bipolar disorder, unspecified: Secondary | ICD-10-CM | POA: Diagnosis not present

## 2021-10-04 LAB — GLUCOSE, CAPILLARY: Glucose-Capillary: 168 mg/dL — ABNORMAL HIGH (ref 70–99)

## 2021-10-04 MED ORDER — ZIPRASIDONE HCL 40 MG PO CAPS
40.0000 mg | ORAL_CAPSULE | Freq: Once | ORAL | Status: AC
  Administered 2021-10-04: 40 mg via ORAL

## 2021-10-04 MED ORDER — OLANZAPINE 10 MG PO TBDP
10.0000 mg | ORAL_TABLET | Freq: Two times a day (BID) | ORAL | Status: DC
  Filled 2021-10-04 (×4): qty 1

## 2021-10-04 MED ORDER — ZIPRASIDONE HCL 40 MG PO CAPS
40.0000 mg | ORAL_CAPSULE | Freq: Two times a day (BID) | ORAL | Status: DC
  Administered 2021-10-04 – 2021-10-05 (×2): 40 mg via ORAL
  Filled 2021-10-04 (×6): qty 1

## 2021-10-04 NOTE — [facility] Group Notes (Signed)
.  Psychoeducational Group Note  Date: 10/04/2021 Time: 0900-1000    Goal Setting   Purpose of Group: This group helps to provide patients with the steps of setting a goal that is specific, measurable, attainable, realistic and time specific. A discussion on how we keep ourselves stuck with negative self talk. Homework given for Patients to write 30 positive attributes about themselves.    Participation Level:  Active  Participation Quality:  in Appropriate  Affect:  Appropriate  Cognitive:  in Appropriate  Insight:  lacking  Engagement in Group:  not Engaged  Additional Comments:  Pt states that she is a 10/10. "I don't belong here. I am here to observe and not to be a part. They just picked me up and brought me here" She then walked out of the group.  Dione Housekeeper

## 2021-10-04 NOTE — Progress Notes (Signed)
[facility] Group Notes:  (Nursing/MHT/Case Management/Adjunct)  Date:  10/04/2021  Time:  2015  Type of Therapy:   wrap up group  Participation Level:  Active  Participation Quality:  Appropriate, Attentive, Sharing, and Supportive  Affect:  Appropriate  Cognitive:  Alert  Insight:  Lacking  Engagement in Group:  Engaged  Modes of Intervention:  Clarification, Education, and Support  Summary of Progress/Problems: Positive thinking and positive change were discussed. Pt reports a reduction in anxiety today and attributes it to a new medicine that was started.   [name] 10/04/2021, 9:27 PM

## 2021-10-04 NOTE — Progress Notes (Addendum)
   10/04/21 1300  Psych Admission Type (Psych Patients Only)  Admission Status Involuntary  Psychosocial Assessment  Patient Complaints Anxiety;Irritability  Eye Contact Brief  Facial Expression Animated  Affect Irritable  Speech Pressured;Loud  Interaction Demanding  Motor Activity Pacing  Appearance/Hygiene Unremarkable  Behavior Characteristics Anxious;Agitated  Mood Anxious;Irritable  Aggressive Behavior  Targets Other (Comment)  Type of Behavior Verbal  Effect No apparent injury  Thought Process  Coherency Circumstantial  Content WDL  Delusions None reported or observed  Perception WDL  Hallucination None reported or observed  Judgment Impaired  Confusion WDL  Danger to Self  Current suicidal ideation? Denies  Danger to Others  Danger to Others Reported or observed  Danger to Others Abnormal  Harmful Behavior to others No threats or harm toward other people  Destructive Behavior No threats or harm toward property   Pt less irritable today- compliant with medications. Pt continues to refuse finger sticks, stating, "I don't check my sugar at home. My A1C went from 11 to 7 so I must be doing something right".  Pt denies SI/HI and A/VH.

## 2021-10-04 NOTE — Progress Notes (Addendum)
[facility] MD Progress Note  10/04/2021 11:22 AM Erika Rhodes  MRN:  [id number] Subjective:  Erika Rhodes is a 40 year old female with past psychiatric history of MDD with psychosis, PTSD, h/o psychosis and medical history of diabetes, asthma initially presented to [facility] under IVC by family who states patient has not been taking her mental health or diabetes medications and has been manic with reports of being hypnotized and having erratic behaviors.  Chart Review of Past 24 hrs: The patient's chart was reviewed and nursing notes were reviewed. The patient's case was discussed in multidisciplinary team meeting.  Per MAR: - Patient is not compliant with scheduled meds: Refusing insulin, nicotine patch, trazodone, and dose of Geodon this morning. - PRNs: Vistaril x2, trazodone x1, and Zyprexa 5 mg for agitation x1 Per RN notes, no documented behavioral issues and is attending group. Patient slept 6.75 hours  Patient had the following psychiatric recommendations yesterday:  - Start Geodon 20 mg twice daily - Continue nicotine patch daily  On Assessment Today (11/26): Case was discussed in the multidisciplinary team. MAR was reviewed and patient was compliant with medications. Patient seen, assessed, and discussed with attending Dr. Caswell Corwin.  She is lying in the unit's nook on her room pillow, with her unicorn mask over her eyes.  Initially, she states that she is on unit restrictions, so she cannot speak with this Probation officer.  When advised that I am her physician, she reports that "nothing is wrong with me.  My boyfriend should be under IVC, not me." She reports that she has not taken diabetes medications in 6 months nor any psychotropics, so questions how she can be non-compliant with a regimen she has not been taking.  She goes on to say that with the unit restrictions, she has not been eating, as her food has been cold.  When advised that she has to eat with her medication, she says "well I guess I  will be taking medications then."  She then reports feeling unsafe on the unit, clarifying that she does not like to be alone in her room, but denied the desire to have a roommate.    She reports no SI, HI, AVH, first rank symptoms, ideas of reference, or paranoia on the unit. Although, her thoughts about her boyfriend playing music after proposing to her and questioning his motives reveal her paranoia about the situation.  Principal Problem: Bipolar I disorder ([place]) Diagnosis: Principal Problem:   Bipolar I disorder ([place]) Active Problems:   Asthma   Cannabis use disorder, moderate, dependence (HCC)   Diabetes ([place])   PTSD (post-traumatic stress disorder)   Psychosis ([place])   GERD (gastroesophageal reflux disease)  Total Time spent with patient: 30 minutes  Past Psychiatric History: See H&P  Past Medical History:  Past Medical History:  Diagnosis Date   Allergic reaction    Anxiety    Asthma    Depression    DM (diabetes mellitus), gestational    Migraines    Rheumatoid arthritis ([place]) 2016   Diagnosed Dr.  Lamarr Lulas; has been treated with MTX, Humira, etc, but made her sick.  Was taking CBD oil and stopped as tested positive in a drug screen for work.    Past Surgical History:  Procedure Laterality Date   ABDOMINAL HYSTERECTOMY  2017   Fibroids; Both ovaries intact still   BACK SURGERY     CESAREAN SECTION  2009   CHOLECYSTECTOMY  2010   laparoscopic   TONSILLECTOMY     Family History:  Family History  Problem Relation Age of Onset   Arthritis Mother        Rheumatoid   Depression Daughter        and anxiety   Allergies Son    Family Psychiatric  History: See H&P Social History:  Social History   Substance and Sexual Activity  Alcohol Use Yes   Alcohol/week: 2.0 standard drinks   Types: 2 Glasses of wine per week   Comment: 2 drinks 1-2xmonth     Social History   Substance and Sexual Activity  Drug Use Not Currently    Social History    Socioeconomic History   Marital status: Single    Spouse name: Not on file   Number of children: 5   Years of education: 14   Highest education level: Associate degree: academic program  Occupational History   Not on file  Tobacco Use   Smoking status: Never   Smokeless tobacco: Never  Vaping Use   Vaping Use: Some days  Substance and Sexual Activity   Alcohol use: Yes    Alcohol/week: 2.0 standard drinks    Types: 2 Glasses of wine per week    Comment: 2 drinks 1-2xmonth   Drug use: Not Currently   Sexual activity: Yes    Birth control/protection: Surgical  Other Topics Concern   Not on file  Social History Narrative   Lives with current boyfriend   See history of present illness for more history 04/22/2020   Social Determinants of Health   Financial Resource Strain: Not on file  Food Insecurity: Not on file  Transportation Needs: Not on file  Physical Activity: Not on file  Stress: Not on file  Social Connections: Not on file   Additional Social History:         Sleep: Fair  Appetite:  Poor  Current Medications: Current Facility-Administered Medications  Medication Dose Route Frequency Provider Last Rate Last Admin   acetaminophen (TYLENOL) tablet 650 mg  650 mg Oral Q6H PRN Nwoko, Agnes I, NP       albuterol (VENTOLIN HFA) 108 (90 Base) MCG/ACT inhaler 1-2 puff  1-2 puff Inhalation Q4H PRN Karsten Ro, MD       alum & mag hydroxide-simeth (MAALOX/MYLANTA) 200-200-20 MG/5ML suspension 30 mL  30 mL Oral Q4H PRN Armandina Stammer I, NP   30 mL at 10/02/21 1501   hydrOXYzine (ATARAX/VISTARIL) tablet 25 mg  25 mg Oral TID PRN Armandina Stammer I, NP   25 mg at 10/03/21 1742   insulin aspart (novoLOG) injection 0-9 Units  0-9 Units Subcutaneous TID WC Doda, Vandana, MD       magnesium hydroxide (MILK OF MAGNESIA) suspension 30 mL  30 mL Oral Daily PRN Nwoko, Agnes I, NP       nicotine (NICODERM CQ - dosed in mg/24 hours) patch 21 mg  21 mg Transdermal Q0600 Nwoko, Nicole Kindred  I, NP       OLANZapine zydis (ZYPREXA) disintegrating tablet 10 mg  10 mg Oral BID [name], MD       OLANZapine zydis (ZYPREXA) disintegrating tablet 5 mg  5 mg Oral Q8H PRN Yanitza Shvartsman, MD   5 mg at 10/04/21 1009   And   ziprasidone (GEODON) injection 20 mg  20 mg Intramuscular PRN Abagail Limb, Harrold Donath, MD       traZODone (DESYREL) tablet 50 mg  50 mg Oral QHS PRN Armandina Stammer I, NP   50 mg at 10/03/21 2132   traZODone (DESYREL) tablet  50 mg  50 mg Oral Once Bobbitt, Shalon E, NP        Lab Results:  Results for orders placed or performed during the hospital encounter of 10/02/21 (from the past 48 hour(s))  hCG, quantitative, pregnancy     Status: None   Collection Time: 10/03/21  6:23 PM  Result Value Ref Range   hCG, Beta Chain, Quant, S 1 <5 mIU/mL    Comment:          GEST. AGE      CONC.  (mIU/mL)   <=1 WEEK        5 - 50     2 WEEKS       50 - 500     3 WEEKS       100 - 10,000     4 WEEKS     1,000 - 30,000     5 WEEKS     3,500 - 115,000   6-8 WEEKS     12,000 - 270,000    12 WEEKS     15,000 - 220,000        FEMALE AND NON-PREGNANT FEMALE:     LESS THAN 5 mIU/mL Performed at [facility], [place] [address] [place], [place] [address]   Glucose, capillary     Status: Abnormal   Collection Time: 10/04/21  6:04 AM  Result Value Ref Range   Glucose-Capillary 168 (H) 70 - 99 mg/dL    Comment: Glucose reference range applies only to samples taken after fasting for at least 8 hours.    Blood Alcohol level:  Lab Results  Component Value Date   ETH <10 10/01/2021   ETH <10 0000000    Metabolic Disorder Labs: Lab Results  Component Value Date   HGBA1C 7.7 (H) 09/03/2021   MPG 174.29 09/03/2021   MPG 186 05/31/2021   No results found for: PROLACTIN Lab Results  Component Value Date   CHOL 204 (H) 09/03/2021   TRIG 165 (H) 09/03/2021   HDL 41 09/03/2021   CHOLHDL 5.0 09/03/2021   VLDL 33 09/03/2021   LDLCALC 130 (H) 09/03/2021    LDLCALC 92 05/31/2021    Physical Findings:  Musculoskeletal: Strength & Muscle Tone: within normal limits Gait & Station: normal, steady Patient leans: N/A  Psychiatric Specialty Exam:  Presentation  General Appearance: Bizarre; Casual (unicorn eye mask over eyes. Lying on pillow in the nook on the unit)  Eye Contact:Absent  Speech:Pressured  Speech Volume:Increased  Handedness:Right   Mood and Affect  Mood:Irritable; Labile; Dysphoric  Affect:Congruent; Labile   Thought Process  Thought Processes:Goal Directed  Descriptions of Associations:Intact  Orientation:Full (Time, Place and Person)  Thought Content:Paranoid Ideation; Rumination  History of Schizophrenia/Schizoaffective disorder:No  Duration of Psychotic Symptoms:Greater than six months  Hallucinations:Hallucinations: None  Ideas of Reference:Paranoia  Suicidal Thoughts:Suicidal Thoughts: No  Homicidal Thoughts:Homicidal Thoughts: No   Sensorium  Memory:Immediate Good; Recent Good; Remote Fair  Judgment:Impaired  Insight:Poor   Executive Functions  Concentration:Fair  Attention Span:Fair  [place]  Language:Good   Psychomotor Activity  Psychomotor Activity:Psychomotor Activity: Normal   Assets  Assets:Communication Skills; Housing; Intimacy; Resilience; Social Support   Sleep  Sleep:Sleep: Fair Number of Hours of Sleep: 6.75    Physical Exam: Physical Exam Vitals and nursing note reviewed.  Constitutional:      Comments: Patient lying in nook with unicorn mask over her eyes  HENT:     Head: Normocephalic and atraumatic.  Mouth/Throat:     Mouth: Mucous membranes are moist.  Pulmonary:     Effort: Pulmonary effort is normal.  Skin:    Coloration: Skin is not jaundiced.  Neurological:     General: No focal deficit present.     Mental Status: She is oriented to person, place, and time.     Motor: No weakness.     Gait: Gait  normal.   Review of Systems  Reason unable to perform ROS: Patient refused.   Blood pressure (!) 149/124, pulse (!) 125, temperature 98.6 F (37 C), temperature source Oral, resp. rate 16, height 5\' 3"  (1.6 m), weight 86.6 kg, SpO2 98 %. Body mass index is 33.83 kg/m.   Treatment Plan Summary: Antania is a 40 year old female with past psychiatric history of bipolar 1 disorder with psychotic features, cluster B personality traits, PTSD, and medical history of type II diabetes presenting from [facility] under IVC by family for mania with bizarre statements and behaviors.  While on the unit, patient has become irritable, refusing all medications and to cooperate.   Daily contact with patient to assess and evaluate symptoms and progress in treatment and Medication management  Labs reviewed CBG 168 EKG pending; most recent Qtc 477.  Safety and Monitoring --  Admission to inpatient psychiatric unit for safety, stabilization and treatment -- Daily contact with patient to assess and evaluate symptoms and progress in treatment -- Patient's case to be discussed in multi-disciplinary team meeting. -- Patient will be encouraged to participate in the therapeutic group milieu. -- Observation Level : q15 minute checks -- Vital signs:  q12 hours -- Precautions: suicide, elopement, assault.    Plan  -Monitor Vitals. -Monitor for Suicidal Ideation. -Monitor for withdrawal symptoms. -Monitor for medication side effects.   # Bipolar 1 disorder, with psychosis -INCREASE Geodon to 40 mg BID for mood stabilization and psychotic features -Continue agitation protocol Zyprexa/Ativan/Geodon   # Asthma -Albuterol inhaler as needed.   # Diabetes -Monitor daily CBG's -Start SSI; patient refusing.   # Nicotine dependence -Continue nicotine patch daily, patient refusing x2 doses, so will move to as needed   PRN's  -Continue Tylenol 650 mg every 6 hours as needed for pain or fever -Continue Milk of  Magnesia 30 ml PRN Daily for Constipation. -Continue Maalox/Mylanta 30 ml Q4H PRN for Indigestion. -Continue Hydroxyzine 25 mg TID PRN for Anxiety. -Continue Trazodone 50 mg QHS PRN for sleep.    Discharge Planning: Social work and case management to assist with discharge planning and identification of hospital follow-up needs prior to discharge Estimated LOS: 5 to 7 days  Discharge Concerns: Need to establish a safety plan; Medication compliance and effectiveness Discharge Goals: Return home with outpatient referrals for mental health follow-up including medication management/psychotherapy   Rosezetta Schlatter, MD PGY-1 10/04/2021, 11:22 AM  Total Time Spent in Direct Patient Care:  I personally spent 30 minutes on the unit in direct patient care. The direct patient care time included face-to-face time with the patient, reviewing the patient's chart, communicating with other professionals, and coordinating care. Greater than 50% of this time was spent in counseling or coordinating care with the patient regarding goals of hospitalization, psycho-education, and discharge planning needs.  I have independently evaluated the patient during a face-to-face assessment on 10/04/21. I reviewed the patient's chart, and I participated in key portions of the service. I discussed the case with the [employer], and I agree with the assessment and plan of care as documented in the [facility]  Officer's note, as addended by me or notated below:  I agree with the note and plan. I saw the patient, she is calm today, but remains paranoid and thoughts are illogical at times. The pt reads out loud, a poem that she wrote about Dr. Caswell Corwin, since yesterday.  Pt is agreeable with increasing geodon.  We discussed that if pt maintains behavior without incident or outburst, we will dc unit restrictions in 24 hours.   Janine Limbo, MD Psychiatrist

## 2021-10-04 NOTE — [facility] Group Notes (Addendum)
The focus of this group is to educate the patient on the purpose and policies of crisis stabilization and provide a format to answer questions about their admission.  The group details unit policies and expectations of patients while admitted.  Pt did not attend or participate in morning orientation group.

## 2021-10-04 NOTE — Progress Notes (Signed)
Erika Rhodes was agitated at the beginning of the shift.  Due to unit restriction, she was unable to leave unit for visitation.  "Then let him back here to visit."  Attempted to talk about consequences of out behaviors, but she walked away.  She was agitating all the other patients and yelling at staff.  She demanded to know everybody's name (first and last) so she could "sue [place]."  She has been negative on the unit and talking to other patients about how she has been treated.  She was asked nicely to return her room and did eventually return to her room.  She attended evening AA group.  While attempting to get HS medications, she thought that her nurse was the female nurse and had to explain to her that this writer was actually.  After that conversation, she came to the medication window with her make up on and was pleasant and laughing.  She requested something to help her sleep because "you won't have to deal with me tonight."  One time repeat dosage of Trazodone obtained in which she didn't need as she is currently resting with her eyes closed and appears to be asleep.

## 2021-10-04 NOTE — Group Note (Signed)
[name] Group Therapy Note  10/04/2021    10:00-11:00am   Type of Therapy and Topic:  Group Therapy: Early Messages Received About Anger  Participation Level:  Active   Description of Group:   In this group, patients shared and discussed the early messages received in their lives about anger through parental or other adult modeling, teaching, repression, punishment, violence, and more.  Participants identified how those childhood lessons influence even now how they usually or often react when angered.  The group discussed that anger is a secondary emotion and what may be the underlying emotional themes that come out through anger outbursts or that are ignored through anger suppression.    Therapeutic Goals: Patients will identify one or more childhood message about anger that they received and how it was taught to them. Patients will discuss how these childhood experiences have influenced and continue to influence their own expression or repression of anger even today. Patients will explore possible primary emotions that tend to fuel their secondary emotion of anger. Patients will learn that anger itself is normal and cannot be eliminated, and that healthier coping skills can assist with resolving conflict rather than worsening situations.  Summary of Patient Progress:  The patient shared that her childhood lessons about anger were growing up with excessive anger and emotional torture.  As a result, she now has a lack of trust for others and tries not to yell at her children.  The patient participated fully and demonstrated insight.  Therapeutic Modalities:   Cognitive Behavioral Therapy Motivation Interviewing   Aldine Contes, [place] 10/04/2021  12:29 PM

## 2021-10-04 NOTE — [facility] Group Notes (Signed)
.  Psychoeducational Group Note    Date:10/04/2021 Time: 1300-1400    Purpose of Group: . The group focus' on teaching patients on how to identify their needs and their Life Skills:  A group where two lists are made. What people need and what are things that we do that are unhealthy. The lists are developed by the patients and it is explained that we often do the actions that are not healthy to get our list of needs met.  Goal:: to develop the coping skills needed to get their needs met  Participation Level:  Did not attend   Geoff Dacanay A  

## 2021-10-04 NOTE — Progress Notes (Signed)
   10/03/21 2132  Psych Admission Type (Psych Patients Only)  Admission Status Involuntary  Psychosocial Assessment  Patient Complaints Anxiety;Insomnia;Irritability;Suspiciousness  Eye Contact Glaring  Facial Expression Angry  Affect Angry  Speech Argumentative;Pressured;Loud;Tangential  Interaction Demanding;Dominating  Motor Activity Pacing  Appearance/Hygiene Unremarkable  Behavior Characteristics Anxious;Agitated;Intrusive  Mood Angry;Irritable  Aggressive Behavior  Targets Other (Comment)  Type of Behavior Verbal  Effect No apparent injury  Thought Process  Coherency Circumstantial  Content WDL  Delusions None reported or observed  Perception WDL  Hallucination None reported or observed  Judgment Impaired  Confusion WDL  Danger to Self  Current suicidal ideation? Denies  Danger to Others  Danger to Others Reported or observed  Danger to Others Abnormal  Harmful Behavior to others No threats or harm toward other people  Destructive Behavior No threats or harm toward property  Description of Destructive Behavior loud and disruptive

## 2021-10-04 NOTE — Progress Notes (Signed)
Pt refused CBG this evening.  

## 2021-10-05 DIAGNOSIS — F319 Bipolar disorder, unspecified: Secondary | ICD-10-CM | POA: Diagnosis not present

## 2021-10-05 LAB — PREGNANCY, URINE: Preg Test, Ur: NEGATIVE

## 2021-10-05 LAB — GLUCOSE, CAPILLARY
Glucose-Capillary: 184 mg/dL — ABNORMAL HIGH (ref 70–99)
Glucose-Capillary: 219 mg/dL — ABNORMAL HIGH (ref 70–99)
Glucose-Capillary: 245 mg/dL — ABNORMAL HIGH (ref 70–99)

## 2021-10-05 MED ORDER — BENZTROPINE MESYLATE 0.5 MG PO TABS
0.5000 mg | ORAL_TABLET | Freq: Two times a day (BID) | ORAL | Status: AC
  Administered 2021-10-05 – 2021-10-07 (×5): 0.5 mg via ORAL
  Filled 2021-10-05 (×6): qty 1

## 2021-10-05 MED ORDER — DIPHENHYDRAMINE HCL 50 MG/ML IJ SOLN
50.0000 mg | Freq: Once | INTRAMUSCULAR | Status: AC
  Administered 2021-10-05: 12:00:00 50 mg via INTRAMUSCULAR

## 2021-10-05 MED ORDER — NICOTINE 21 MG/24HR TD PT24: 21.0000 mg | MEDICATED_PATCH | Freq: Every day | TRANSDERMAL | Status: DC | PRN

## 2021-10-05 MED ORDER — BENZTROPINE MESYLATE 1 MG PO TABS
1.0000 mg | ORAL_TABLET | Freq: Two times a day (BID) | ORAL | Status: DC
  Filled 2021-10-05 (×3): qty 1

## 2021-10-05 MED ORDER — ZIPRASIDONE HCL 20 MG PO CAPS
20.0000 mg | ORAL_CAPSULE | Freq: Two times a day (BID) | ORAL | Status: DC
  Filled 2021-10-05 (×3): qty 1

## 2021-10-05 MED ORDER — BENZTROPINE MESYLATE 0.5 MG PO TABS
0.5000 mg | ORAL_TABLET | Freq: Two times a day (BID) | ORAL | Status: DC | PRN
  Administered 2021-10-05: 12:00:00 0.5 mg via ORAL

## 2021-10-05 MED ORDER — ARIPIPRAZOLE 5 MG PO TABS
5.0000 mg | ORAL_TABLET | Freq: Every day | ORAL | Status: DC
  Administered 2021-10-05: 22:00:00 5 mg via ORAL
  Filled 2021-10-05 (×3): qty 1

## 2021-10-05 MED ORDER — OXYMETAZOLINE HCL 0.05 % NA SOLN
1.0000 | Freq: Two times a day (BID) | NASAL | Status: DC
  Administered 2021-10-06 – 2021-10-07 (×3): 1 via NASAL
  Filled 2021-10-05: qty 15

## 2021-10-05 MED ORDER — DIPHENHYDRAMINE HCL 50 MG/ML IJ SOLN
INTRAMUSCULAR | Status: AC
  Filled 2021-10-05: qty 1

## 2021-10-05 MED ORDER — BENZTROPINE MESYLATE 0.5 MG PO TABS
ORAL_TABLET | ORAL | Status: AC
  Filled 2021-10-05: qty 1

## 2021-10-05 NOTE — Progress Notes (Signed)
   10/04/21 2300  Psych Admission Type (Psych Patients Only)  Admission Status Voluntary  Psychosocial Assessment  Patient Complaints None  Eye Contact Fair  Facial Expression Animated  Affect Depressed  Speech Logical/coherent  Interaction Assertive  Motor Activity Fidgety  Appearance/Hygiene Unremarkable  Behavior Characteristics Appropriate to situation  Mood Depressed  Thought Process  Coherency WDL  Content WDL  Delusions None reported or observed  Perception WDL  Hallucination None reported or observed  Judgment Impaired  Confusion WDL  Danger to Self  Current suicidal ideation? Denies  Danger to Others  Danger to Others None reported or observed  Danger to Others Abnormal  Harmful Behavior to others No threats or harm toward other people  Destructive Behavior No threats or harm toward property

## 2021-10-05 NOTE — [facility] Group Notes (Signed)
Psychoeducational Group Note  Date:  09/28/2021 Time:  1300-1400   Group Topic/Focus: This is a continuation of the group from Saturday. Pt's have been asked to formulate a list of 30 positives about themselves. This list is to be read 2 times a day for 30 days, looking in a mirror. Changing patterns of negative self talk. Also discussed is the fact that there have been some people who hurt us in the past. We keep that memory alive within us. Ways to cope with this are discused   Participation Level:  Did not attend Hinda Lindor A  

## 2021-10-05 NOTE — Progress Notes (Addendum)
Pt agreeable to having her finger stuck for CBGs, and agreed to the 2 units of insulin ordered, stating, "I guess I have to take this to get out of here". Shortly after, pt complained of having dystonic symptoms, stating "I used to get these same symptoms after Haldol".  Provider was notified. Pt administered 50 mg benadryl IM per MD order. Pt expressed relief of symptoms within the hour.   10/05/21 1200  Psych Admission Type (Psych Patients Only)  Admission Status Voluntary  Psychosocial Assessment  Patient Complaints Anxiety  Eye Contact Fair  Facial Expression Animated  Affect Depressed  Speech Logical/coherent  Interaction Assertive  Motor Activity Fidgety  Appearance/Hygiene Unremarkable  Behavior Characteristics Cooperative  Mood Anxious  Aggressive Behavior  Targets Other (Comment)  Type of Behavior Verbal  Effect No apparent injury  Thought Process  Coherency WDL  Content WDL  Delusions None reported or observed  Perception WDL  Hallucination None reported or observed  Judgment Impaired  Confusion WDL  Danger to Self  Current suicidal ideation? Denies  Danger to Others  Danger to Others None reported or observed  Danger to Others Abnormal  Harmful Behavior to others No threats or harm toward other people  Destructive Behavior No threats or harm toward property

## 2021-10-05 NOTE — Progress Notes (Signed)
Pt refused her blood sugar to be checked, pt also refused v/s checked.

## 2021-10-05 NOTE — Group Note (Signed)
[facility] [name] Group Therapy Note  Date/Time:  10/05/2021 10:00-11:00AM  Type of Therapy and Topic:  Group Therapy:  Healthy and Unhealthy Supports plus being "My Own Hero"  Participation Level:  Active   Description of Group:   [employer], LCSWA, led group.  Patients in this group were invited to identify the differences between healthy and unhealthy supports and then to identify those people in their lives who fall into one category or the other, as well as why.  They were then introduced to the idea of adding more healthy supports and decreasing the unhealthy ones.  Patients discussed what additional healthy supports could be helpful in their recovery and wellness after discharge in order to maintain stability.   An emphasis was placed on using counselor, doctor, therapy groups, 12-step groups, and problem-specific support groups to expand supports.  The song "My Own Hero" was played to encourage full participation in their own recovery journey instead of expecting others to do all the work for them.  The song "I Know Where I've Been" was played as further encouragement that they are further in their journey than they were previously.  Therapeutic Goals:   1)  discuss importance of adding supports to stay well once out of the hospital  2)  compare healthy versus unhealthy supports and identify some examples of each  3)  generate ideas and descriptions of healthy supports that can be added  4)  offer mutual support about how to address unhealthy supports  5)  encourage active participation in and adherence to discharge plan    Summary of Patient Progress:  The patient stated that current healthy supports in her life are her boyfriend who can be nice, while current unhealthy supports include her husband because he can be mean abruptly..  The patient expressed a willingness to add appropriate support(s) to help in her recovery journey.  Shew was in and out of the room, and while she seemed  invested in the topic while present in the room, was not engaged enough to sit through the entirety of group.   Therapeutic Modalities:   Motivational Interviewing Brief Solution-Focused Therapy  Ambrose Mantle, [name]

## 2021-10-05 NOTE — [facility] Suicide Risk Assessment (Signed)
Adult Psychoeducational Group Not Date:  10/05/2021 Time:  0900-1045 Group Topic/Focus: PROGRESSIVE RELAXATION. A group where deep breathing is taught and tensing and relaxation muscle groups is used. Imagery is used as well.  Pts are asked to imagine 3 pillars that hold them up when they are not able to hold themselves up.  Participation Level:  did not attend Adea Geisel A  

## 2021-10-05 NOTE — Progress Notes (Signed)
[facility] Group Notes:  (Nursing/MHT/Case Management/Adjunct)  Date:  10/05/2021  Time:  2015  Type of Therapy:   wrap up group  Participation Level:  Active  Participation Quality:  Appropriate, Attentive, Sharing, and Supportive  Affect:  Appropriate  Cognitive:  Alert  Insight:  Improving  Engagement in Group:  Engaged  Modes of Intervention:  Clarification, Socialization, and Support  Summary of Progress/Problems: Positive thinking and self-care were discussed.   [name] 10/05/2021, 9:46 PM

## 2021-10-05 NOTE — Progress Notes (Addendum)
[facility] MD Progress Note  10/05/2021 1:47 PM Erika Rhodes  MRN:  [id number] Subjective:  Erika Rhodes is a 40 year old female with past psychiatric history of MDD with psychosis, PTSD, h/o psychosis and medical history of diabetes, asthma initially presented to [facility] under IVC by family who states patient has not been taking her mental health or diabetes medications and has been manic with reports of being hypnotized and having erratic behaviors.  Chart Review of Past 24 hrs: The patient's chart was reviewed and nursing notes were reviewed. The patient's case was discussed in multidisciplinary team meeting.  Per MAR: - Patient is not compliant with all scheduled meds: Refusing insulin (but took this AM), nicotine patch, trazodone. She has been compliant with Geodon. - PRNs: Vistaril x1, trazodone x1, Tylenol x1, Maalox x1, and Zyprexa 5 mg for agitation x1 Per RN notes, no documented behavioral issues and is attending group. Patient slept 6.75 hours  Patient had the following psychiatric recommendations yesterday:  - Increase Geodon to 40 mg twice daily - Continue nicotine patch daily  On Assessment Today (11/27): Case was discussed in the multidisciplinary team. MAR was reviewed and patient was compliant with medications. Patient seen, assessed, and discussed with attending Dr. Sherron Flemings.  Today, Erika Rhodes is pleasant, reporting fatigue from the increased dose of Geodon, but that her mood is otherwise "good."  She reports and an intact appetite.  She acknowledges that she refused vitals and her morning CBG, but is agreeable to having them taken.  She denies further somatic symptoms at this time. She reports no SI, HI, AVH, first rank symptoms, ideas of reference, or paranoia on the unit.  After getting 2 units of short-acting insulin for a CBG of 184, Erika Rhodes complains of "dystonia," and is agreeable to a one-time dose of Benadryl and scheduled Cogentin.  Principal Problem: Bipolar I disorder  (HCC) Diagnosis: Principal Problem:   Bipolar I disorder (HCC) Active Problems:   Asthma   Cannabis use disorder, moderate, dependence (HCC)   Diabetes (HCC)   PTSD (post-traumatic stress disorder)   Psychosis (HCC)   GERD (gastroesophageal reflux disease)  Total Time spent with patient: 30 minutes  Past Psychiatric History: See H&P  Past Medical History:  Past Medical History:  Diagnosis Date   Allergic reaction    Anxiety    Asthma    Depression    DM (diabetes mellitus), gestational    Migraines    Rheumatoid arthritis (HCC) 2016   Diagnosed Dr.  Patsi Sears; has been treated with MTX, Humira, etc, but made her sick.  Was taking CBD oil and stopped as tested positive in a drug screen for work.    Past Surgical History:  Procedure Laterality Date   ABDOMINAL HYSTERECTOMY  2017   Fibroids; Both ovaries intact still   BACK SURGERY     CESAREAN SECTION  2009   CHOLECYSTECTOMY  2010   laparoscopic   TONSILLECTOMY     Family History:  Family History  Problem Relation Age of Onset   Arthritis Mother        Rheumatoid   Depression Daughter        and anxiety   Allergies Son    Family Psychiatric  History: See H&P Social History:  Social History   Substance and Sexual Activity  Alcohol Use Yes   Alcohol/week: 2.0 standard drinks   Types: 2 Glasses of wine per week   Comment: 2 drinks 1-2xmonth     Social History   Substance and Sexual Activity  Drug Use Not Currently    Social History   Socioeconomic History   Marital status: Single    Spouse name: Not on file   Number of children: 5   Years of education: 14   Highest education level: Associate degree: academic program  Occupational History   Not on file  Tobacco Use   Smoking status: Never   Smokeless tobacco: Never  Vaping Use   Vaping Use: Some days  Substance and Sexual Activity   Alcohol use: Yes    Alcohol/week: 2.0 standard drinks    Types: 2 Glasses of wine per week    Comment: 2  drinks 1-2xmonth   Drug use: Not Currently   Sexual activity: Yes    Birth control/protection: Surgical  Other Topics Concern   Not on file  Social History Narrative   Lives with current boyfriend   See history of present illness for more history 04/22/2020   Social Determinants of Health   Financial Resource Strain: Not on file  Food Insecurity: Not on file  Transportation Needs: Not on file  Physical Activity: Not on file  Stress: Not on file  Social Connections: Not on file   Additional Social History:         Sleep: Fair  Appetite: Fair  Current Medications: Current Facility-Administered Medications  Medication Dose Route Frequency Provider Last Rate Last Admin   acetaminophen (TYLENOL) tablet 650 mg  650 mg Oral Q6H PRN Lindell Spar I, NP   650 mg at 10/04/21 2057   albuterol (VENTOLIN HFA) 108 (90 Base) MCG/ACT inhaler 1-2 puff  1-2 puff Inhalation Q4H PRN Armando Reichert, MD       alum & mag hydroxide-simeth (MAALOX/MYLANTA) 200-200-20 MG/5ML suspension 30 mL  30 mL Oral Q4H PRN Lindell Spar I, NP   30 mL at 10/04/21 1202   benztropine (COGENTIN) tablet 1 mg  1 mg Oral BID Rosezetta Schlatter, MD       hydrOXYzine (ATARAX/VISTARIL) tablet 25 mg  25 mg Oral TID PRN Lindell Spar I, NP   25 mg at 10/04/21 2057   insulin aspart (novoLOG) injection 0-9 Units  0-9 Units Subcutaneous TID WC Armando Reichert, MD   2 Units at 10/05/21 1156   magnesium hydroxide (MILK OF MAGNESIA) suspension 30 mL  30 mL Oral Daily PRN Nwoko, Herbert Pun I, NP       nicotine (NICODERM CQ - dosed in mg/24 hours) patch 21 mg  21 mg Transdermal Daily PRN Rosezetta Schlatter, MD       OLANZapine zydis (ZYPREXA) disintegrating tablet 5 mg  5 mg Oral Q8H PRN Maurine Mowbray, Ovid Curd, MD   5 mg at 10/04/21 1009   And   ziprasidone (GEODON) injection 20 mg  20 mg Intramuscular PRN Arael Piccione, Ovid Curd, MD       oxymetazoline (AFRIN) 0.05 % nasal spray 1 spray  1 spray Each Nare BID Rosezetta Schlatter, MD       traZODone (DESYREL)  tablet 50 mg  50 mg Oral QHS PRN Lindell Spar I, NP   50 mg at 10/04/21 2057   ziprasidone (GEODON) capsule 40 mg  40 mg Oral BID WC Rosezetta Schlatter, MD   40 mg at 10/05/21 0800    Lab Results:  Results for orders placed or performed during the hospital encounter of 10/02/21 (from the past 30 hour(s))  hCG, quantitative, pregnancy     Status: None   Collection Time: 10/03/21  6:23 PM  Result Value Ref Range   hCG, Beta Chain,  Quant, S 1 <5 mIU/mL    Comment:          GEST. AGE      CONC.  (mIU/mL)   <=1 WEEK        5 - 50     2 WEEKS       50 - 500     3 WEEKS       100 - 10,000     4 WEEKS     1,000 - 30,000     5 WEEKS     3,500 - 115,000   6-8 WEEKS     12,000 - 270,000    12 WEEKS     15,000 - 220,000        FEMALE AND NON-PREGNANT FEMALE:     LESS THAN 5 mIU/mL Performed at [facility], [place] [address], [place], [place] [address]   Glucose, capillary     Status: Abnormal   Collection Time: 10/04/21  6:04 AM  Result Value Ref Range   Glucose-Capillary 168 (H) 70 - 99 mg/dL    Comment: Glucose reference range applies only to samples taken after fasting for at least 8 hours.  Pregnancy, urine     Status: None   Collection Time: 10/05/21  6:00 AM  Result Value Ref Range   Preg Test, Ur NEGATIVE NEGATIVE    Comment:        THE SENSITIVITY OF THIS METHODOLOGY IS >20 mIU/mL. Performed at [facility], [place] [address] [place], [place] [address]   Glucose, capillary     Status: Abnormal   Collection Time: 10/05/21 11:44 AM  Result Value Ref Range   Glucose-Capillary 184 (H) 70 - 99 mg/dL    Comment: Glucose reference range applies only to samples taken after fasting for at least 8 hours.    Blood Alcohol level:  Lab Results  Component Value Date   ETH <10 10/01/2021   ETH <10 0000000    Metabolic Disorder Labs: Lab Results  Component Value Date   HGBA1C 7.7 (H) 09/03/2021   MPG 174.29 09/03/2021   MPG 186 05/31/2021   No  results found for: PROLACTIN Lab Results  Component Value Date   CHOL 204 (H) 09/03/2021   TRIG 165 (H) 09/03/2021   HDL 41 09/03/2021   CHOLHDL 5.0 09/03/2021   VLDL 33 09/03/2021   LDLCALC 130 (H) 09/03/2021   LDLCALC 92 05/31/2021    Physical Findings:  Musculoskeletal: Strength & Muscle Tone: within normal limits Gait & Station: normal, steady Patient leans: N/A  Psychiatric Specialty Exam:  Presentation  General Appearance: Appropriate for Environment; Casual; Fairly Groomed  Eye Contact:Good  Speech:Clear and Coherent; Normal Rate  Speech Volume:Normal  Handedness:Right   Mood and Affect  Mood: Euphoric  Affect:Appropriate; Congruent   Thought Process  Thought Processes: Tangential  Descriptions of Associations:Intact  Orientation:Full (Time, Place and Person)  Thought Content: Grandiose "call me Dr. Lisabeth Devoid"  History of Schizophrenia/Schizoaffective disorder:No  Duration of Psychotic Symptoms:Greater than six months  Hallucinations:Hallucinations: None  Ideas of Reference:None  Suicidal Thoughts:Suicidal Thoughts: No  Homicidal Thoughts:Homicidal Thoughts: No   Sensorium  Memory:Immediate Good; Recent Good  Judgment:Fair  Insight: Poor   Executive Functions  Concentration:Good  Attention Span:Good  [place] of Knowledge:Good  Language:Good   Psychomotor Activity  Psychomotor Activity:Psychomotor Activity: Normal   Assets  Assets:Communication Skills; Desire for Improvement; Housing; Intimacy; Resilience; Social Support   Sleep  Sleep:Sleep: Good Number of Hours of Sleep:  6.75    Physical Exam: Physical Exam Vitals and nursing note reviewed.  Constitutional:      Appearance: Normal appearance.     Comments: Patient lying in nook with unicorn mask over her eyes  HENT:     Head: Normocephalic and atraumatic.     Mouth/Throat:     Mouth: Mucous membranes are moist.  Neck:     Comments: On  reassessment, endorses neck stiffness; able to swallow and move air without issue Pulmonary:     Effort: Pulmonary effort is normal.  Musculoskeletal:     Cervical back: Normal range of motion. No rigidity or tenderness.  Skin:    Coloration: Skin is not jaundiced.  Neurological:     General: No focal deficit present.     Mental Status: She is alert and oriented to person, place, and time. Mental status is at baseline.     Cranial Nerves: No cranial nerve deficit.     Sensory: No sensory deficit.     Motor: No weakness.     Coordination: Coordination normal.     Gait: Gait normal.   Review of Systems  Constitutional:  Negative for fever.  HENT:  Positive for congestion.   Respiratory:  Negative for shortness of breath.   Cardiovascular:  Negative for chest pain.  Gastrointestinal: Negative.   Genitourinary: Negative.   Musculoskeletal:  Positive for neck pain.  Neurological:  Negative for dizziness and headaches.   Blood pressure (!) 125/97, pulse 98, temperature 98 F (36.7 C), temperature source Oral, resp. rate (!) 22, height 5\' 3"  (1.6 m), weight 86.6 kg, SpO2 100 %. Body mass index is 33.83 kg/m.   Treatment Plan Summary: Moeshia is a 40 year old female with past psychiatric history of bipolar 1 disorder with psychotic features, cluster B personality traits, PTSD, and medical history of type II diabetes presenting from [facility] under IVC by family for mania with bizarre statements and behaviors.  While on the unit, patient was initially irritable, refusing all medications, but has since participated in care appropriately.   Daily contact with patient to assess and evaluate symptoms and progress in treatment and Medication management  Labs reviewed CBG 184 EKG pending; most recent Qtc 477.  Safety and Monitoring --  Admission to inpatient psychiatric unit for safety, stabilization and treatment -- Daily contact with patient to assess and evaluate symptoms and progress in  treatment -- Patient's case to be discussed in multi-disciplinary team meeting. -- Patient will be encouraged to participate in the therapeutic group milieu. -- Observation Level : q15 minute checks -- Vital signs:  q12 hours -- Precautions: suicide, elopement, assault.    Plan  -Monitor Vitals. -Monitor for Suicidal Ideation. -Monitor for withdrawal symptoms. -Monitor for medication side effects.   # Bipolar 1 disorder, with psychosis -Due to increased somnolence, and concern for EPS/dystonia, we will discontinue Geodon and switch to Abilify 5 mg nightly for mood stabilization and psychotic features. (r/b/se/a to medication reviewed and she consents to med trial)  - Will discuss with patient initiating a mood stabilizing medication. -Continue agitation protocol Zyprexa/Ativan/Geodon -Patient reports side effect of dystonia after receiving insulin, so 1 time IM Benadryl dose given, and Cogentin 0.5 mg twice daily scheduled for 5 doses, as the geodon is metabolized and removed from plasma    # Asthma -Albuterol inhaler as needed.  #Nasal congestion - Start Afrin 1 spray each nare twice daily x2 days   # Diabetes -Monitor daily CBG's; after next CBG, may be discontinued if  WNL (to be checked since patient received SSI) -Start SSI   # Nicotine dependence -Continue nicotine patch daily, patient refusing x2 doses, moved to as needed   PRN's  -Continue Tylenol 650 mg every 6 hours as needed for pain or fever -Continue Milk of Magnesia 30 ml PRN Daily for Constipation. -Continue Maalox/Mylanta 30 ml Q4H PRN for Indigestion. -Continue Hydroxyzine 25 mg TID PRN for Anxiety. -Continue Trazodone 50 mg QHS PRN for sleep.    Discharge Planning: Social work and case management to assist with discharge planning and identification of hospital follow-up needs prior to discharge Estimated LOS: 5 to 7 days  Discharge Concerns: Need to establish a safety plan; Medication compliance and  effectiveness Discharge Goals: Return home with outpatient referrals for mental health follow-up including medication management/psychotherapy   Rosezetta Schlatter, MD PGY-1 10/05/2021, 1:47 PM  Total Time Spent in Direct Patient Care:  I personally spent 30 minutes on the unit in direct patient care. The direct patient care time included face-to-face time with the patient, reviewing the patient's chart, communicating with other professionals, and coordinating care. Greater than 50% of this time was spent in counseling or coordinating care with the patient regarding goals of hospitalization, psycho-education, and discharge planning needs.  I have independently evaluated the patient during a face-to-face assessment on 10/05/21. I reviewed the patient's chart, and I participated in key portions of the service. I discussed the case with the [employer], and I agree with the assessment and plan of care as documented in the House Officer's note, as addended by me or notated below:  I directly edited the note, as above. Patient complain of muscle spasm in her neck, there was concern for dystonic reaction.  She received Benadryl 50 mg IM once. On exam, she had full range of motion of the neck in all directions, cranial nerves were intact, patient was able to swallow and move air without difficulty or pain The patient was reexamined later in the afternoon, she reports that symptoms have resolved. Patient is apprehensive about continuing Geodon, is agreeable to switching mood stabilizing medication to Abilify. She is still tangential, illogical, and grandiose. I will take off unit restrictions, as patient has had no behavioral disturbances in the last 24 hours.  Janine Limbo, MD Psychiatrist

## 2021-10-06 DIAGNOSIS — F319 Bipolar disorder, unspecified: Secondary | ICD-10-CM | POA: Diagnosis not present

## 2021-10-06 LAB — GLUCOSE, CAPILLARY: Glucose-Capillary: 194 mg/dL — ABNORMAL HIGH (ref 70–99)

## 2021-10-06 LAB — VITAMIN B12: Vitamin B-12: 405 pg/mL (ref 180–914)

## 2021-10-06 MED ORDER — FLUCONAZOLE 150 MG PO TABS
150.0000 mg | ORAL_TABLET | Freq: Once | ORAL | Status: AC
  Administered 2021-10-07: 150 mg via ORAL
  Filled 2021-10-06 (×2): qty 1

## 2021-10-06 MED ORDER — DIPHENHYDRAMINE HCL 50 MG/ML IJ SOLN: 50.0000 mg | INTRAMUSCULAR | Status: DC | PRN

## 2021-10-06 MED ORDER — ARIPIPRAZOLE 10 MG PO TABS
10.0000 mg | ORAL_TABLET | Freq: Every day | ORAL | Status: DC
  Administered 2021-10-06: 22:00:00 10 mg via ORAL
  Filled 2021-10-06 (×3): qty 1

## 2021-10-06 MED ORDER — METRONIDAZOLE 500 MG PO TABS: 500.0000 mg | ORAL_TABLET | Freq: Once | ORAL | Status: DC

## 2021-10-06 MED ORDER — METFORMIN HCL 500 MG PO TABS
500.0000 mg | ORAL_TABLET | Freq: Two times a day (BID) | ORAL | Status: DC
  Administered 2021-10-06 – 2021-10-10 (×8): 500 mg via ORAL
  Filled 2021-10-06 (×3): qty 1
  Filled 2021-10-06 (×2): qty 14
  Filled 2021-10-06 (×6): qty 1
  Filled 2021-10-06 (×2): qty 14
  Filled 2021-10-06: qty 1

## 2021-10-06 NOTE — Group Note (Signed)
Occupational Therapy Group Note  Group Topic:Feelings Management  Group Date: 10/06/2021 Start Time: 1400 End Time: 1440 Facilitators: Jabe Jeanbaptiste, OT/L    Group Description:Group encouraged increased engagement and participation through discussion focused on Building Happiness. Patients were provided a handout and reviewed therapeutic strategies to build happiness including identifying gratitudes, random acts of kindness, exercise, meditation, positive journaling, and fostering relationships. Patients engaged in discussion and encouraged to reflect on each strategy and their experiences.  Therapeutic Goal(s): Identify strategies to build happiness. Identify and implement therapeutic strategies to improve overall mood. Practice and identify gratitudes, random acts of kindness, exercise, meditation, positive journaling, and fostering relationships     Participation Level: Did not attend   Plan: Continue to engage patient in OT groups 2 - 3x/week.  10/06/2021  Braxson Hollingsworth, OT/L 

## 2021-10-06 NOTE — Group Note (Signed)
[name] Group Therapy Note   Group Date: 10/06/2021 Start Time: 1300 End Time: 1400  Type of Therapy and Topic:  Group Therapy - Healthy vs Unhealthy Coping Skills   Participation Level:  Did not attend     Description of Group The focus of this group was to determine what unhealthy coping techniques typically are used by group members and what healthy coping techniques would be helpful in coping with various problems. Patients were guided in becoming aware of the differences between healthy and unhealthy coping techniques. Patients were asked to identify 2-3 healthy coping skills they would like to learn to use more effectively.   Therapeutic Goals Patients learned that coping is what human beings do all day long to deal with various situations in their lives Patients defined and discussed healthy vs unhealthy coping techniques Patients identified their preferred coping techniques and identified whether these were healthy or unhealthy Patients determined 2-3 healthy coping skills they would like to become more familiar with and use more often. Patients provided support and ideas to each other     Summary of Patient Progress: Did not attend   [name], LCSWA 10/06/2021  2:19 PM

## 2021-10-06 NOTE — Progress Notes (Addendum)
[facility] MD Progress Note  10/06/2021 11:21 AM Erika Rhodes  MRN:  [id number] Subjective:  Erika Rhodes is a 40 year old female with past psychiatric history of MDD with psychosis, PTSD, h/o psychosis and medical history of diabetes, asthma initially presented to [facility] under IVC by family who states patient has not been taking her mental health or diabetes medications and has been manic with reports of being hypnotized and having erratic behaviors.  Chart Review of Past 24 hrs: The patient's chart was reviewed and nursing notes were reviewed. The patient's case was discussed in multidisciplinary team meeting.  Per MAR: - Patient has been compliant with all scheduled meds - PRNs: Vistaril x1, trazodone x1 Per RN notes, no documented behavioral issues and is attending group. Patient slept 1.5 hours  Patient had the following psychiatric recommendations yesterday:  - Switched to Abilify 5 mg nightly 2/2 increased somnolence and concerns for EPS/dystonia. - Nicotine patch changed to PRN  On Assessment Today (11/28): Case was discussed in the multidisciplinary team. MAR was reviewed and patient was compliant with medications. Patient seen, assessed, and discussed with attending Dr. Caswell Corwin.  Today, [name] is seen in the dayroom and is initially pleasant, reporting that she had poor sleep last night after sleeping the majority of the day s/p Benadryl IM. The Benadryl was received 2/2 "dystonic reaction" in which she reported spasms of facial muscles, inability to walk, and difficulty swallowing, none of which were noted on physical exam yesterday afternoon. She denies residual symptoms as well as new somatic complaints today. She requested to be discharged today, and when advised that we are still making medication adjustments, she reported that she would begin refusing medications if she isn't discharged due to distrust of staff, "and we would be back to square one." She was advised that  psychiatrically, this shows poor judgment and insight, and medically, it is our due diligence to ensure safety of medications. She became irritable and walked from interview room.  Unable to assess whether patient endorses SI/HI/AVH, paranoia and other first-rank sx.  Principal Problem: Bipolar I disorder ([place]) Diagnosis: Principal Problem:   Bipolar I disorder ([place]) Active Problems:   Asthma   Cannabis use disorder, moderate, dependence (HCC)   Diabetes ([place])   PTSD (post-traumatic stress disorder)   Psychosis ([place])   GERD (gastroesophageal reflux disease)  Total Time spent with patient: 30 minutes  Past Psychiatric History: See H&P  Past Medical History:  Past Medical History:  Diagnosis Date   Allergic reaction    Anxiety    Asthma    Depression    DM (diabetes mellitus), gestational    Migraines    Rheumatoid arthritis ([place]) 2016   Diagnosed Dr.  Lamarr Lulas; has been treated with MTX, Humira, etc, but made her sick.  Was taking CBD oil and stopped as tested positive in a drug screen for work.    Past Surgical History:  Procedure Laterality Date   ABDOMINAL HYSTERECTOMY  2017   Fibroids; Both ovaries intact still   BACK SURGERY     CESAREAN SECTION  2009   CHOLECYSTECTOMY  2010   laparoscopic   TONSILLECTOMY     Family History:  Family History  Problem Relation Age of Onset   Arthritis Mother        Rheumatoid   Depression Daughter        and anxiety   Allergies Son    Family Psychiatric  History: See H&P Social History:  Social History   Substance and Sexual Activity  Alcohol Use Yes   Alcohol/week: 2.0 standard drinks   Types: 2 Glasses of wine per week   Comment: 2 drinks 1-2xmonth     Social History   Substance and Sexual Activity  Drug Use Not Currently    Social History   Socioeconomic History   Marital status: Single    Spouse name: Not on file   Number of children: 5   Years of education: 14   Highest education level: Associate  degree: academic program  Occupational History   Not on file  Tobacco Use   Smoking status: Never   Smokeless tobacco: Never  Vaping Use   Vaping Use: Some days  Substance and Sexual Activity   Alcohol use: Yes    Alcohol/week: 2.0 standard drinks    Types: 2 Glasses of wine per week    Comment: 2 drinks 1-2xmonth   Drug use: Not Currently   Sexual activity: Yes    Birth control/protection: Surgical  Other Topics Concern   Not on file  Social History Narrative   Lives with current boyfriend   See history of present illness for more history 04/22/2020   Social Determinants of Health   Financial Resource Strain: Not on file  Food Insecurity: Not on file  Transportation Needs: Not on file  Physical Activity: Not on file  Stress: Not on file  Social Connections: Not on file   Additional Social History:         Sleep: Poor, but slept throughout the day  Appetite: Fair  Current Medications: Current Facility-Administered Medications  Medication Dose Route Frequency Provider Last Rate Last Admin   acetaminophen (TYLENOL) tablet 650 mg  650 mg Oral Q6H PRN Lindell Spar I, NP   650 mg at 10/04/21 2057   albuterol (VENTOLIN HFA) 108 (90 Base) MCG/ACT inhaler 1-2 puff  1-2 puff Inhalation Q4H PRN Armando Reichert, MD       alum & mag hydroxide-simeth (MAALOX/MYLANTA) 200-200-20 MG/5ML suspension 30 mL  30 mL Oral Q4H PRN Nwoko, Herbert Pun I, NP   30 mL at 10/06/21 0047   ARIPiprazole (ABILIFY) tablet 5 mg  5 mg Oral QHS Rosezetta Schlatter, MD   5 mg at 10/05/21 2130   benztropine (COGENTIN) tablet 0.5 mg  0.5 mg Oral BID Mckyla Deckman, Ovid Curd, MD   0.5 mg at 10/06/21 [id number]   diphenhydrAMINE (BENADRYL) injection 50 mg  50 mg Intramuscular PRN Rosezetta Schlatter, MD       hydrOXYzine (ATARAX/VISTARIL) tablet 25 mg  25 mg Oral TID PRN Lindell Spar I, NP   25 mg at 10/05/21 2236   insulin aspart (novoLOG) injection 0-9 Units  0-9 Units Subcutaneous TID WC Armando Reichert, MD   3 Units at 10/05/21 1656    magnesium hydroxide (MILK OF MAGNESIA) suspension 30 mL  30 mL Oral Daily PRN Nwoko, Herbert Pun I, NP       nicotine (NICODERM CQ - dosed in mg/24 hours) patch 21 mg  21 mg Transdermal Daily PRN Rosezetta Schlatter, MD       OLANZapine zydis (ZYPREXA) disintegrating tablet 5 mg  5 mg Oral Q8H PRN Desmon Hitchner, MD   5 mg at 10/04/21 1009   And   ziprasidone (GEODON) injection 20 mg  20 mg Intramuscular PRN Scot Shiraishi, Ovid Curd, MD       oxymetazoline (AFRIN) 0.05 % nasal spray 1 spray  1 spray Each Nare BID Rosezetta Schlatter, MD   1 spray at 10/06/21 0813   traZODone (DESYREL) tablet 50 mg  50  mg Oral QHS PRN Armandina StammerNwoko, Agnes I, NP   50 mg at 10/05/21 2130    Lab Results:  Results for orders placed or performed during the hospital encounter of 10/02/21 (from the past 48 hour(s))  Pregnancy, urine     Status: None   Collection Time: 10/05/21  6:00 AM  Result Value Ref Range   Preg Test, Ur NEGATIVE NEGATIVE    Comment:        THE SENSITIVITY OF THIS METHODOLOGY IS >20 mIU/mL. Performed at [facility], [address] [place], [place] [id number]   Glucose, capillary     Status: Abnormal   Collection Time: 10/05/21 11:44 AM  Result Value Ref Range   Glucose-Capillary 184 (H) 70 - 99 mg/dL    Comment: Glucose reference range applies only to samples taken after fasting for at least 8 hours.  Glucose, capillary     Status: Abnormal   Collection Time: 10/05/21  4:09 PM  Result Value Ref Range   Glucose-Capillary 219 (H) 70 - 99 mg/dL    Comment: Glucose reference range applies only to samples taken after fasting for at least 8 hours.  Glucose, capillary     Status: Abnormal   Collection Time: 10/05/21  9:00 PM  Result Value Ref Range   Glucose-Capillary 245 (H) 70 - 99 mg/dL    Comment: Glucose reference range applies only to samples taken after fasting for at least 8 hours.  Glucose, capillary     Status: Abnormal   Collection Time: 10/06/21  5:47 AM  Result Value Ref Range    Glucose-Capillary 194 (H) 70 - 99 mg/dL    Comment: Glucose reference range applies only to samples taken after fasting for at least 8 hours.    Blood Alcohol level:  Lab Results  Component Value Date   ETH <10 10/01/2021   ETH <10 09/24/2021    Metabolic Disorder Labs: Lab Results  Component Value Date   HGBA1C 7.7 (H) 09/03/2021   MPG 174.29 09/03/2021   MPG 186 05/31/2021   No results found for: PROLACTIN Lab Results  Component Value Date   CHOL 204 (H) 09/03/2021   TRIG 165 (H) 09/03/2021   HDL 41 09/03/2021   CHOLHDL 5.0 09/03/2021   VLDL 33 09/03/2021   LDLCALC 130 (H) 09/03/2021   LDLCALC 92 05/31/2021    Physical Findings:  Musculoskeletal: Strength & Muscle Tone: within normal limits Gait & Station: normal, steady Patient leans: N/A  Psychiatric Specialty Exam:  Presentation  General Appearance: Appropriate for Environment; Casual; Fairly Groomed  Eye Contact:Good  Speech:Clear and Coherent; Normal Rate  Speech Volume:Normal  Handedness:Right   Mood and Affect  Mood: "Okay. When am I discharging?"  Affect:Congruent; Labile (Initially congruent and pleasant, then became irritable)   Thought Process  Thought Processes: Tangential  Descriptions of Associations:Intact  Orientation:Full (Time, Place and Person)  Thought Content: UTA; patient left interview room  History of Schizophrenia/Schizoaffective disorder:No  Duration of Psychotic Symptoms:Greater than six months  Hallucinations:Hallucinations: -- (UTA)  Ideas of Reference:-- (UTA)  Suicidal Thoughts:Suicidal Thoughts: -- (UTA)  Homicidal Thoughts:Homicidal Thoughts: -- (UTA)   Sensorium  Memory:Immediate Fair; Recent Fair  Judgment:Poor  Insight: Poor   Art therapistxecutive Functions  Concentration:Fair  Attention Span:Fair  Recall:Fair  Fund of Knowledge:Good  Language:Good   Psychomotor Activity  Psychomotor Activity:Psychomotor Activity: Normal   Assets   Assets:Communication Skills; Housing; Intimacy; Resilience; Social Support   Sleep  Sleep:Sleep: Poor Number of Hours of Sleep: 1.75  Physical Exam: Physical Exam Vitals and nursing note reviewed.  Constitutional:      Appearance: Normal appearance.  HENT:     Head: Normocephalic and atraumatic.     Mouth/Throat:     Mouth: Mucous membranes are moist.  Neck:     Comments: On reassessment, endorses neck stiffness; able to swallow and move air without issue Pulmonary:     Effort: Pulmonary effort is normal.  Musculoskeletal:     Cervical back: Normal range of motion. No rigidity or tenderness.  Skin:    Coloration: Skin is not jaundiced.  Neurological:     General: No focal deficit present.     Mental Status: She is alert and oriented to person, place, and time. Mental status is at baseline.     Cranial Nerves: No cranial nerve deficit.     Sensory: No sensory deficit.     Motor: No weakness.     Coordination: Coordination normal.     Gait: Gait normal.   Review of Systems  Constitutional:  Negative for fever.  HENT:  Negative for congestion.   Respiratory:  Negative for shortness of breath.   Cardiovascular:  Negative for chest pain.  Gastrointestinal: Negative.   Genitourinary: Negative.   Musculoskeletal:  Negative for neck pain.  Neurological:  Negative for dizziness and headaches.   Blood pressure (!) 121/101, pulse (!) 106, temperature 98.3 F (36.8 C), temperature source Oral, resp. rate 17, height 5\' 3"  (1.6 m), weight 86.6 kg, SpO2 98 %. Body mass index is 33.83 kg/m.   Treatment Plan Summary: Maliyah is a 40 year old female with past psychiatric history of bipolar 1 disorder with psychotic features, cluster B personality traits, PTSD, and medical history of type II diabetes presenting from [facility] under IVC by family for mania with bizarre statements and behaviors.  While on the unit, patient was initially irritable, refusing all medications, but has since  participated in care appropriately. Today, she threatens to refuse all medications if she isn't discharged.  Daily contact with patient to assess and evaluate symptoms and progress in treatment and Medication management  Labs reviewed CBG 194 EKG pending; most recent Qtc 477.  Safety and Monitoring --  Admission to inpatient psychiatric unit for safety, stabilization and treatment -- Daily contact with patient to assess and evaluate symptoms and progress in treatment -- Patient's case to be discussed in multi-disciplinary team meeting. -- Patient will be encouraged to participate in the therapeutic group milieu. -- Observation Level : q15 minute checks -- Vital signs:  q12 hours -- Precautions: suicide, elopement, assault.    Plan  -Monitor Vitals. -Monitor for Suicidal Ideation. -Monitor for withdrawal symptoms. -Monitor for medication side effects.   # Bipolar 1 disorder, with psychosis Due to increased somnolence, and concern for EPS/dystonia, which patient reported after receiving insulin, Geodon discontinued. One time Benadryl IM received.  -Continue Abilify 5 mg nightly for mood stabilization and psychotic features. -Continue agitation protocol Zyprexa/Ativan/Geodon -Continue Cogentin 0.5 mg twice daily scheduled for 5 doses, as the geodon is metabolized and removed from plasma. IM Benadryl added to MAR PRN EPS.   # Asthma -Albuterol inhaler as needed.  #Nasal congestion - Start Afrin 1 spray each nare twice daily x2 days   # Diabetes -Monitor daily CBG's; after next CBG, may be discontinued if WNL -Discontinue SSI and Start Metformin 500 mg BID; patient agreeable. -Will obtain B12 level to assess for deficiency, as medication can deplete levels.   # Nicotine dependence -Continue nicotine patch daily,  patient refusing x2 doses, moved to as needed   PRN's  -Continue Tylenol 650 mg every 6 hours as needed for pain or fever -Continue Milk of Magnesia 30 ml PRN Daily  for Constipation. -Continue Maalox/Mylanta 30 ml Q4H PRN for Indigestion. -Continue Hydroxyzine 25 mg TID PRN for Anxiety. -Continue Trazodone 50 mg QHS PRN for sleep.    Discharge Planning: Social work and case management to assist with discharge planning and identification of hospital follow-up needs prior to discharge Estimated LOS: 5 to 7 days  Discharge Concerns: Need to establish a safety plan; Medication compliance and effectiveness Discharge Goals: Return home with outpatient referrals for mental health follow-up including medication management/psychotherapy   [name], MD PGY-1 10/06/2021, 11:21 AM  Total Time Spent in Direct Patient Care:  I personally spent 30 minutes on the unit in direct patient care. The direct patient care time included face-to-face time with the patient, reviewing the patient's chart, communicating with other professionals, and coordinating care. Greater than 50% of this time was spent in counseling or coordinating care with the patient regarding goals of hospitalization, psycho-education, and discharge planning needs.  I have independently evaluated the patient during a face-to-face assessment on 10/06/21. I reviewed the patient's chart, and I participated in key portions of the service. I discussed the case with the [employer], and I agree with the assessment and plan of care as documented in the [employer] note, as addended by me or notated below:  Patient continues to be suspicious and distrustful of staff and providers. She is also splitting still. Her mood is irritable and she was hostile many times during the interview. She was provided with options for medication adjustments today, including increasing abilify for improved mood stability and treating paranoid thoughts -or- switching to zyprexa (as patient specifically requests zyprexa for anxiety, by name). Patient tells this Clinical research associate 'you're the doctor. You decide what I should be on.'  Then  she says after more discussion that she plans to start refusing medications.  While discussing treatment options and discharge planning, the patients states that she believes she is being manipulated.  We reiterated that it is her choice as to take medications or not, and she has been offered options for managing her mood instability and paranoia - this includes the multiple medication options that I discussed with the patient yesterday, including: zyprexa, seroquel, geodon (was on yesterday), latuda, risperdal, asenapine, abilify, and depakote.  I have instructed nurse to not give ativan, as pt is med seeking. And pt can have already ordered PRNs for anxiety or agitation.    Edit to resident plan: -Increase abilify to 10 mg once daily for bipolar disorder   Phineas Inches, MD Psychiatrist

## 2021-10-06 NOTE — Progress Notes (Signed)
Pt received PRN Zyprexa for agitation and PRN Vistaril for anxiety this shift. Mood remains labile on interaction and she is resistive to care when demands are not met. Pt noted with multiple demands for Ativan this shift "I had it yesterday, my doctor gave it to me. Why can't you". Refused to have her vitals and glucose monitoring done on 3 separate attempts because she could not have Ativan"No, I refuse. Tell your doctor that". Pt did not attend scheduled groups despite multiple prompts. Compliant with scheduled medications with multiple prompts. Safety checks maintained at Q 15 minutes intervals. Support and reassurance offered. All medications administered with verbal education and effects monitored.  Pt tolerates all medications, meals and fluids well. Denies SI, HI, VH and pain when assessed. Reports relief with PRNs. Remains safe on unit.

## 2021-10-06 NOTE — Progress Notes (Signed)
Adult Psychoeducational Group Note  Date:  10/06/2021 Time:  1:07 PM  Group Topic/Focus:  Goals Group:   The focus of this group is to help patients establish daily goals to achieve during treatment and discuss how the patient can incorporate goal setting into their daily lives to aide in recovery.  Participation Level:  Active  Participation Quality:  Appropriate  Affect:  Appropriate  Cognitive:  Appropriate  Insight: Appropriate  Engagement in Group:  Engaged  Modes of Intervention:  Discussion  Additional Comments:  Patient attended morning orientation/goal setting group & participated.   Rubin Dais W Kameshia Madruga 10/06/2021, 1:07 PM

## 2021-10-06 NOTE — BHH Counselor (Signed)
CSW spoke with the Pt about discharge planning and consent to contact a family member for safety planning.  The Pt stated "I was not brought into the hospital by my own consent so I am not signing anything giving my consent for anything".  The Pt refused to sign her ROI's and declined all contacts for SPE.  The Pt does state that she will sign a safe transport wavier at discharge so that she can get a ride from the hospital to her home.  She states that she does not want an [employer] or [employer] stating that "they almost killed me last time and I would prefer the hospital's transportation instead".  CSW informed that Pt that she would place walk-in hours and information for therapy and psychiatry into the Pt's follow-up section on Epic incase the Pt decides at a later date to arrange or inquire about these services.  The Pt stated "I have not used them before and I wont be using them now".  The Pt walked away from the CSW stating that she did not want to work on discharge planning and only wanted to be approached about the matter again when it was time to be discharged and sign the transportation waiver.

## 2021-10-06 NOTE — Progress Notes (Signed)
Erika Rhodes was appropriate on the unit this evening.  She attended evening wrap up group.  She requested trazodone for sleep. She later came back and asked for hydroxyzine and Maalox.  She stated that she feels hyper since taking the Abilify.  Encouraged her to talk with MD about her concerns.  She did report that she slept a lot during the day and she may not sleep as well tonight.  She has been up and down most of the night.  She is currently sitting quietly in her room.  Q 15 minute checks maintained for safety   10/05/21 2130  Psych Admission Type (Psych Patients Only)  Admission Status Voluntary  Psychosocial Assessment  Patient Complaints Insomnia;Anxiety  Eye Contact Fair  Facial Expression Animated  Affect Depressed  Speech Logical/coherent  Interaction Assertive  Motor Activity Fidgety  Appearance/Hygiene Unremarkable  Behavior Characteristics Cooperative;Appropriate to situation  Mood Anxious  Thought Process  Coherency WDL  Content WDL  Delusions None reported or observed  Perception WDL  Hallucination None reported or observed  Judgment Impaired  Confusion WDL  Danger to Self  Current suicidal ideation? Denies  Danger to Others  Danger to Others None reported or observed

## 2021-10-06 NOTE — B[facility] Group Notes (Signed)
Patient did not attend group.    Spiritual care group on grief and loss facilitated by chaplain Katy Edem Tiegs, B[facility]   Group Goal:   Support / Education around grief and loss   Members engage in facilitated group support and psycho-social education.   Group Description:   Following introductions and group rules, group members engaged in facilitated group dialog and support around topic of loss, with particular support around experiences of loss in their lives. Group Identified types of loss (relationships / self / things) and identified patterns, circumstances, and changes that precipitate losses. Reflected on thoughts / feelings around loss, normalized grief responses, and recognized variety in grief experience. Group noted Worden's four tasks of grief in discussion.   Group drew on Adlerian / Rogerian, narrative, MI,    

## 2021-10-06 NOTE — [facility] Group Notes (Signed)
[facility] Group Notes:  (Nursing/MHT/Case Management/Adjunct)  Date:  10/06/2021  Time:  9:38 PM  Type of Therapy:  Psychoeducational Skills  Participation Level:  None  Participation Quality:  Resistant  Affect:  Blunted  Cognitive:  Lacking  Insight:  Lacking  Engagement in Group:  Resistant  Modes of Intervention:  Education  Summary of Progress/Problems: The patient attended the majority of the meeting and then walked out of the room.   Hazle Coca S 10/06/2021, 9:38 PM

## 2021-10-06 NOTE — [facility] Group Notes (Signed)
Patient did not attend the Psycho-Ed group. 

## 2021-10-06 NOTE — Group Note (Signed)
Recreation Therapy Group Note   Group Topic:Stress Management  Group Date: 10/06/2021 Start Time: 0935 End Time: 0955 Facilitators: Caroll Rancher, LRT,CTRS Location: [address] Dayroom   Goal Area(s) Addresses:  Patient will identify members of their support system. Patient will acknowledge benefit of healthy supports in daily life. Patient will identify any negative relationships in their support system and discuss alternatives.  Patient will verbalize positive effect of healthy supports post d/c.   Group Description:  Meditation.  LRT played a meditation that focused on releasing anxiety through slow controlled breathing, being able to acknowledge thoughts and feelings without acting on them and releasing things beyond your control.    Affect/Mood: N/A   Participation Level: Did not attend    Clinical Observations/Individualized Feedback: Pt did not attend group.    Plan: Continue to engage patient in RT group sessions 2-3x/week.   Caroll Rancher, LRT,CTRS 10/06/2021 1:30 PM

## 2021-10-06 NOTE — B[facility] Suicide Risk Assessment (Signed)
[facility] INPATIENT:  Family/Significant Other Suicide Prevention Education  Suicide Prevention Education:  Patient Refusal for Family/Significant Other Suicide Prevention Education: The patient Erika Rhodes has refused to provide written consent for family/significant other to be provided Family/Significant Other Suicide Prevention Education during admission and/or prior to discharge.  Physician notified.  [name] 10/06/2021, 1:07 PM

## 2021-10-07 MED ORDER — ARIPIPRAZOLE 5 MG PO TABS
5.0000 mg | ORAL_TABLET | Freq: Every day | ORAL | Status: DC
  Filled 2021-10-07 (×3): qty 1

## 2021-10-07 MED ORDER — ARIPIPRAZOLE 15 MG PO TABS
7.5000 mg | ORAL_TABLET | Freq: Every day | ORAL | Status: DC
  Filled 2021-10-07: qty 1

## 2021-10-07 MED ORDER — ARIPIPRAZOLE 5 MG PO TABS
5.0000 mg | ORAL_TABLET | Freq: Every day | ORAL | Status: DC
  Filled 2021-10-07: qty 1

## 2021-10-07 MED ORDER — SALINE SPRAY 0.65 % NA SOLN: 1.0000 | NASAL | Status: DC | PRN

## 2021-10-07 NOTE — Progress Notes (Signed)
Pt refused bedtime blood sugar

## 2021-10-07 NOTE — Progress Notes (Signed)
Erika Rhodes has been up and down all night.  She declined her CBG at bedtime and reported "I am not going to do them anymore."  She was hesitant about taking her bedtime medications because she doesn't want to stay any longer.  "If I take the increase dosage they are going to keep me because they are making changes, If I don't they will keep me here longer."  She did finally take them but came back to the nurses  20 minutes later stating "I vomited a lot, I just want you to know about it."  She promptly returned to her room.  Diflucan was ordered for her yeast infection symptoms and had to be brought from the main pharmacy.  She declined taking it because she vomited earlier.  Dosage was moved to tomorrow morning.  She has been out various times to look at the clock.  She was able to fall back asleep.  Q 15 minute checks maintained for safety.     10/06/21 2200  Psych Admission Type (Psych Patients Only)  Admission Status Voluntary  Psychosocial Assessment  Patient Complaints Anxiety;Irritability;Insomnia  Eye Contact Glaring  Facial Expression Flat  Affect Irritable  Speech Argumentative  Interaction Attention-seeking;Demanding;Manipulative  Motor Activity Fidgety;Restless  Appearance/Hygiene Unremarkable  Behavior Characteristics Irritable  Mood Anxious;Labile  Thought Process  Coherency WDL  Content WDL  Delusions None reported or observed  Perception WDL  Hallucination None reported or observed  Judgment Impaired  Confusion WDL  Danger to Self  Current suicidal ideation? Denies  Danger to Others  Danger to Others None reported or observed

## 2021-10-07 NOTE — Progress Notes (Signed)
Pt's boyfriend called repeatedly this evening.  He was demanding to talk with her but she informed staff that she didn't want anymore phone calls this evening.  He accused staff of lying about not wanting phone calls.  He evening stated "do I need have to call the police again and make another report."

## 2021-10-07 NOTE — Progress Notes (Signed)
The patient's positive event for the day is that she enjoyed talking to her peers. Her goal for tomorrow is to get discharged.

## 2021-10-07 NOTE — Progress Notes (Signed)
Writer did environmental check. During the check there was a make up mirror found. The make up mirror was locked patient belongs locker 13. RN talked to patient along with Clinical research associate

## 2021-10-07 NOTE — Group Note (Signed)
Recreation Therapy Group Note   Group Topic:Animal Assisted Therapy   Group Date: 10/07/2021 Start Time: 1430 End Time: 1515 Facilitators: Caroll Rancher, LRT,CTRS Location: [address]   AAA/T Program Assumption of Risk Form signed by Patient/ or Parent Legal Guardian Yes  Patient understands his/her participation is voluntary Yes   Affect/Mood: N/A   Participation Level: Did not attend    Clinical Observations/Individualized Feedback:     Plan: Continue to engage patient in RT group sessions 2-3x/week.   Caroll Rancher, LRT,CTRS 10/07/2021 4:15 PM

## 2021-10-07 NOTE — Progress Notes (Signed)
Progress note  Pt found in the hallway pacing. Pt was compliant with medications. Pt continued to voice concerns with their increase in medications. Pt stated they didn't know what the medication was for or if the dosing was appropriate. Pt was provided education with no evidence of learning. Pt states they feel they aren't displaying manic behaviors and an increase in medications aren't necessary. Pt then spilled their drink and refused to clean their mess up. Pt became verbally abusive toward other staff, stating they don't work here and cleaning up after themselves isn't their job. Pt was placed on unit restriction again since safety measures and behaviors couldn't be obtained. Pt has continued to be passively aggressive toward staff, attempting to staff split with other pt's. Pt also continues to voice concerns frequently with their spouse on the phone, threatening staff and hospital with multiple lawsuits and retaliation for the care received. Pt denies si/hi/ah/vh and verbally agrees to approach staff if these become apparent or before harming themselves/others while at [facility].  A: Pt provided support and encouragement. Pt given medication per protocol and standing orders. Q32m safety checks implemented and continued.  R: Pt safe on the unit. Will continue to monitor.

## 2021-10-07 NOTE — Progress Notes (Addendum)
[facility] MD Progress Note  10/07/2021 2:46 PM Erika Rhodes  MRN:  [id number] Subjective:  Erika Rhodes is a 40 year old female with past psychiatric history of MDD with psychosis, PTSD, h/o psychosis and medical history of diabetes, asthma initially presented to [facility] under IVC by family who states patient has not been taking her mental health or diabetes medications and has been manic with reports of being hypnotized and having erratic behaviors.  Chart Review of Past 24 hrs: The patient's chart was reviewed and nursing notes were reviewed. The patient's case was discussed in multidisciplinary team meeting.  Per MAR: - Patient has been compliant with all scheduled meds, reporting an episode of emesis after taking Abilify - PRNs: Maalox x1, Vistaril x1, trazodone x1, Zyprexa x1 for agitation Per RN notes, she had 1 episode of agitation yesterday and refused vitals/CBGs.  However, she is attending some groups, but missing most. Patient slept 4.25 hours  Patient had the following psychiatric recommendations yesterday:  - Increased Abilify to 10 mg nightly  - Nicotine patch changed to PRN  On Assessment Today (11/29): Case was discussed in the multidisciplinary team. [facility] was reviewed and patient was compliant with medications. Patient seen, assessed, and discussed with attending Dr. Loleta Chance.  Today, Erika Rhodes is seen in her room and is pleasant, reporting that she slept well last night and her appetite is intact.  She denies Although, she reports that approximately 30 minutes after taking 10 mg Abilify, she had an episode of emesis.  She states that she tolerated 5 mg of Abilify well and is agreeable to continuing that dosage.  She denies residual symptoms as well as new somatic complaints today.  She denies SI/HI/AVH, paranoia and other first-rank sx, and does not vocalize delusions.  Principal Problem: Bipolar I disorder (HCC) Diagnosis: Principal Problem:   Bipolar I disorder (HCC) Active  Problems:   Asthma   Cannabis use disorder, moderate, dependence (HCC)   Diabetes (HCC)   PTSD (post-traumatic stress disorder)   GERD (gastroesophageal reflux disease)  Total Time spent with patient: 30 minutes  Past Psychiatric History: See H&P  Past Medical History:  Past Medical History:  Diagnosis Date   Allergic reaction    Anxiety    Asthma    Depression    DM (diabetes mellitus), gestational    Migraines    Rheumatoid arthritis (HCC) 2016   Diagnosed Dr.  Patsi Sears; has been treated with MTX, Humira, etc, but made her sick.  Was taking CBD oil and stopped as tested positive in a drug screen for work.    Past Surgical History:  Procedure Laterality Date   ABDOMINAL HYSTERECTOMY  2017   Fibroids; Both ovaries intact still   BACK SURGERY     CESAREAN SECTION  2009   CHOLECYSTECTOMY  2010   laparoscopic   TONSILLECTOMY     Family History:  Family History  Problem Relation Age of Onset   Arthritis Mother        Rheumatoid   Depression Daughter        and anxiety   Allergies Son    Family Psychiatric  History: See H&P Social History:  Social History   Substance and Sexual Activity  Alcohol Use Yes   Alcohol/week: 2.0 standard drinks   Types: 2 Glasses of wine per week   Comment: 2 drinks 1-2xmonth     Social History   Substance and Sexual Activity  Drug Use Not Currently    Social History   Socioeconomic History  Marital status: Single    Spouse name: Not on file   Number of children: 5   Years of education: 14   Highest education level: Associate degree: academic program  Occupational History   Not on file  Tobacco Use   Smoking status: Never   Smokeless tobacco: Never  Vaping Use   Vaping Use: Some days  Substance and Sexual Activity   Alcohol use: Yes    Alcohol/week: 2.0 standard drinks    Types: 2 Glasses of wine per week    Comment: 2 drinks 1-2xmonth   Drug use: Not Currently   Sexual activity: Yes    Birth  control/protection: Surgical  Other Topics Concern   Not on file  Social History Narrative   Lives with current boyfriend   See history of present illness for more history 04/22/2020   Social Determinants of Health   Financial Resource Strain: Not on file  Food Insecurity: Not on file  Transportation Needs: Not on file  Physical Activity: Not on file  Stress: Not on file  Social Connections: Not on file   Additional Social History:         Sleep: Fair  Appetite: Fair  Current Medications: Current Facility-Administered Medications  Medication Dose Route Frequency Provider Last Rate Last Admin   acetaminophen (TYLENOL) tablet 650 mg  650 mg Oral Q6H PRN Lindell Spar I, NP   650 mg at 10/07/21 1332   albuterol (VENTOLIN HFA) 108 (90 Base) MCG/ACT inhaler 1-2 puff  1-2 puff Inhalation Q4H PRN Armando Reichert, MD       alum & mag hydroxide-simeth (MAALOX/MYLANTA) 200-200-20 MG/5ML suspension 30 mL  30 mL Oral Q4H PRN Nwoko, Agnes I, NP   30 mL at 10/06/21 0047   ARIPiprazole (ABILIFY) tablet 5 mg  5 mg Oral QHS Rosezetta Schlatter, MD       benztropine (COGENTIN) tablet 0.5 mg  0.5 mg Oral BID Massengill, Ovid Curd, MD   0.5 mg at 10/07/21 0759   diphenhydrAMINE (BENADRYL) injection 50 mg  50 mg Intramuscular PRN Rosezetta Schlatter, MD       hydrOXYzine (ATARAX/VISTARIL) tablet 25 mg  25 mg Oral TID PRN Lindell Spar I, NP   25 mg at 10/06/21 1818   magnesium hydroxide (MILK OF MAGNESIA) suspension 30 mL  30 mL Oral Daily PRN Lindell Spar I, NP       metFORMIN (GLUCOPHAGE) tablet 500 mg  500 mg Oral BID WC Rosezetta Schlatter, MD   500 mg at 10/07/21 0759   nicotine (NICODERM CQ - dosed in mg/24 hours) patch 21 mg  21 mg Transdermal Daily PRN Rosezetta Schlatter, MD       OLANZapine zydis (ZYPREXA) disintegrating tablet 5 mg  5 mg Oral Q8H PRN Massengill, Nathan, MD   5 mg at 10/06/21 1319   And   ziprasidone (GEODON) injection 20 mg  20 mg Intramuscular PRN Massengill, Ovid Curd, MD       traZODone  (DESYREL) tablet 50 mg  50 mg Oral QHS PRN Lindell Spar I, NP   50 mg at 10/06/21 2201    Lab Results:  Results for orders placed or performed during the hospital encounter of 10/02/21 (from the past 48 hour(s))  Glucose, capillary     Status: Abnormal   Collection Time: 10/05/21  4:09 PM  Result Value Ref Range   Glucose-Capillary 219 (H) 70 - 99 mg/dL    Comment: Glucose reference range applies only to samples taken after fasting for at least 8  hours.  Glucose, capillary     Status: Abnormal   Collection Time: 10/05/21  9:00 PM  Result Value Ref Range   Glucose-Capillary 245 (H) 70 - 99 mg/dL    Comment: Glucose reference range applies only to samples taken after fasting for at least 8 hours.  Glucose, capillary     Status: Abnormal   Collection Time: 10/06/21  5:47 AM  Result Value Ref Range   Glucose-Capillary 194 (H) 70 - 99 mg/dL    Comment: Glucose reference range applies only to samples taken after fasting for at least 8 hours.  Vitamin B12     Status: None   Collection Time: 10/06/21  6:39 PM  Result Value Ref Range   Vitamin B-12 405 180 - 914 pg/mL    Comment: (NOTE) This assay is not validated for testing neonatal or myeloproliferative syndrome specimens for Vitamin B12 levels. Performed at [facility], [place] [address] [place],  [address]     Blood Alcohol level:  Lab Results  Component Value Date   [facility] <10 10/01/2021   ETH <10 0000000    Metabolic Disorder Labs: Lab Results  Component Value Date   HGBA1C 7.7 (H) 09/03/2021   MPG 174.29 09/03/2021   MPG 186 05/31/2021   No results found for: PROLACTIN Lab Results  Component Value Date   CHOL 204 (H) 09/03/2021   TRIG 165 (H) 09/03/2021   HDL 41 09/03/2021   CHOLHDL 5.0 09/03/2021   VLDL 33 09/03/2021   LDLCALC 130 (H) 09/03/2021   LDLCALC 92 05/31/2021    Physical Findings:  Musculoskeletal: Strength & Muscle Tone: within normal limits Gait & Station: normal,  steady Patient leans: N/A  Psychiatric Specialty Exam:  Presentation  General Appearance: Appropriate for Environment; Casual; Fairly Groomed  Eye Contact:Good  Speech:Clear and Coherent; Normal Rate  Speech Volume:Normal  Handedness:Right   Mood and Affect  Mood: "I am okay."  Affect:Appropriate; Congruent   Thought Process  Thought Processes: Linear, goal directed  Descriptions of Associations:Intact  Orientation:Full (Time, Place and Person)  Thought Content: Logical, denies SI/HI/AVH.  Less perseveration on discharging  History of Schizophrenia/Schizoaffective disorder:No  Duration of Psychotic Symptoms:Greater than six months  Hallucinations:Hallucinations: None  Ideas of Reference:None  Suicidal Thoughts:Suicidal Thoughts: No  Homicidal Thoughts:Homicidal Thoughts: No   Sensorium  Memory:Immediate Fair; Recent Fair  Judgment:Poor  Insight: Poor, shallow   Executive Functions  Concentration:Fair  Attention Span:Fair  [place] of Knowledge:Good  Language:Good   Psychomotor Activity  Psychomotor Activity:Psychomotor Activity: Normal   Assets  Assets:Communication Skills; Housing; Intimacy; Resilience; Social Support   Sleep  Sleep:Sleep: Fair Number of Hours of Sleep: 4.25    Physical Exam: Physical Exam Vitals and nursing note reviewed.  Constitutional:      Appearance: Normal appearance.  HENT:     Head: Normocephalic and atraumatic.     Mouth/Throat:     Mouth: Mucous membranes are moist.  Pulmonary:     Effort: Pulmonary effort is normal.  Musculoskeletal:     Cervical back: Normal range of motion. No rigidity or tenderness.  Skin:    Coloration: Skin is not jaundiced or pale.  Neurological:     General: No focal deficit present.     Mental Status: She is alert and oriented to person, place, and time. Mental status is at baseline.     Cranial Nerves: No cranial nerve deficit.     Sensory: No sensory  deficit.     Motor: No  weakness.     Coordination: Coordination normal.     Gait: Gait normal.   Review of Systems  Constitutional:  Negative for fever.  HENT:  Negative for congestion.   Respiratory:  Negative for shortness of breath.   Cardiovascular:  Negative for chest pain.  Gastrointestinal:  Positive for vomiting. Negative for abdominal pain, constipation, diarrhea and nausea.  Genitourinary: Negative.   Musculoskeletal:  Negative for neck pain.  Neurological:  Negative for dizziness and headaches.   Blood pressure 125/87, pulse (!) 116, temperature 97.8 F (36.6 C), temperature source Oral, resp. rate 17, height 5\' 3"  (1.6 m), weight 86.6 kg, SpO2 99 %. Body mass index is 33.83 kg/m.   Treatment Plan Summary: Louri is a 40 year old female with past psychiatric history of bipolar 1 disorder with psychotic features, cluster B personality traits, PTSD, and medical history of type II diabetes presenting from [facility] under IVC by family for mania with bizarre statements and behaviors.  While on the unit, patient was initially irritable, refusing all medications, but has since participated in care appropriately. Today, she is cooperative with medications, but will be placed on unit restrictions after refusing to clean up after herself off the unit and inciting other patients to staff split.  Daily contact with patient to assess and evaluate symptoms and progress in treatment and Medication management  Labs reviewed CBG 194 EKG pending; most recent Qtc 477. Baseline B12 405  Safety and Monitoring --  Admission to inpatient psychiatric unit for safety, stabilization and treatment -- Daily contact with patient to assess and evaluate symptoms and progress in treatment -- Patient's case to be discussed in multi-disciplinary team meeting. -- Patient will be encouraged to participate in the therapeutic group milieu. -- Observation Level : q15 minute checks -- Vital signs:  q12 hours --  Precautions: suicide, elopement, assault.    Plan  -Monitor Vitals. -Monitor for Suicidal Ideation. -Monitor for withdrawal symptoms. -Monitor for medication side effects.   # Bipolar 1 disorder, with psychosis Due to increased somnolence, and concern for EPS/dystonia, which patient reported after receiving insulin, Geodon discontinued. One time Benadryl IM received.  -Resume Abilify 5 mg nightly for mood stabilization and psychotic features since patient had an episode of emesis with 10 mg.  Considered 7.5 mg, but with formulation as half of a pill, concerns for compliance in cutting pills when patient returns home, so will reinitiate 5 mg. -Continue agitation protocol Zyprexa/Ativan/Geodon -Continue Cogentin 0.5 mg twice daily scheduled for 5 doses, as the geodon is metabolized and removed from plasma. IM Benadryl added to MAR PRN EPS.   # Asthma -Albuterol inhaler as needed.  #Nasal congestion - Afrin 1 spray each nare twice daily x2 days complete - Ocean saline nasal spray available as needed congestion   # Diabetes -Monitor daily CBG's; after next CBG, may be discontinued if WNL -Continue metformin 500 mg BID; patient agreeable. -Will obtain B12 level to assess for deficiency, as medication can deplete levels.   # Nicotine dependence -Continue nicotine patch daily, patient refusing x2 doses, moved to as needed   PRN's  -Continue Tylenol 650 mg every 6 hours as needed for pain or fever -Continue Milk of Magnesia 30 ml PRN Daily for Constipation. -Continue Maalox/Mylanta 30 ml Q4H PRN for Indigestion. -Continue Hydroxyzine 25 mg TID PRN for Anxiety. -Continue Trazodone 50 mg QHS PRN for sleep.    Discharge Planning: Social work and case management to assist with discharge planning and identification of hospital follow-up needs prior  to discharge Estimated LOS: 5 to 7 days  Discharge Concerns: Need to establish a safety plan; Medication compliance and effectiveness Discharge  Goals: Return home with outpatient referrals for mental health follow-up including medication management/psychotherapy   Rosezetta Schlatter, MD PGY-1 10/07/2021, 2:46 PM

## 2021-10-07 NOTE — [facility] Counselor (Signed)
CSW spoke with Rose Fillers with [facility] [phone] who called to check on the status of the Pt. CSW explained to Mrs. Kandice Moos that the Pt is currently refusing all treatment and that there is no scheduled discharge date at this time.  CSW informed Mrs. Kandice Moos that [facility] information was placed in the Pt's follow up chart if she chooses to participate in medication management in the future.

## 2021-10-07 NOTE — Plan of Care (Signed)
  Problem: Education: Goal: Ability to state activities that reduce stress will improve Outcome: Progressing   Problem: Coping: Goal: Ability to identify and develop effective coping behavior will improve Outcome: Progressing   Problem: Self-Concept: Goal: Ability to identify factors that promote anxiety will improve Outcome: Progressing   

## 2021-10-08 ENCOUNTER — Encounter (HOSPITAL_COMMUNITY): Payer: Self-pay

## 2021-10-08 MED ORDER — LORAZEPAM 1 MG PO TABS: 2.0000 mg | ORAL_TABLET | Freq: Three times a day (TID) | ORAL | Status: DC | PRN

## 2021-10-08 MED ORDER — FLUPHENAZINE HCL 2.5 MG/ML IJ SOLN: 2.5000 mg | Freq: Three times a day (TID) | INTRAMUSCULAR | Status: DC | PRN

## 2021-10-08 MED ORDER — FLUPHENAZINE HCL 2.5 MG PO TABS
2.5000 mg | ORAL_TABLET | Freq: Three times a day (TID) | ORAL | Status: DC
  Administered 2021-10-09: 2.5 mg via ORAL
  Filled 2021-10-08 (×8): qty 1

## 2021-10-08 MED ORDER — DIPHENHYDRAMINE HCL 25 MG PO CAPS: 50.0000 mg | ORAL_CAPSULE | Freq: Four times a day (QID) | ORAL | Status: DC | PRN

## 2021-10-08 MED ORDER — DIPHENHYDRAMINE HCL 50 MG/ML IJ SOLN: 50.0000 mg | Freq: Three times a day (TID) | INTRAMUSCULAR | Status: DC | PRN

## 2021-10-08 MED ORDER — IBUPROFEN 800 MG PO TABS
800.0000 mg | ORAL_TABLET | Freq: Once | ORAL | Status: AC
  Administered 2021-10-08: 800 mg via ORAL
  Filled 2021-10-08 (×2): qty 1

## 2021-10-08 MED ORDER — FLUPHENAZINE HCL 5 MG PO TABS: 5.0000 mg | ORAL_TABLET | Freq: Three times a day (TID) | ORAL | Status: DC

## 2021-10-08 MED ORDER — LORAZEPAM 2 MG/ML IJ SOLN: 2.0000 mg | Freq: Three times a day (TID) | INTRAMUSCULAR | Status: DC | PRN

## 2021-10-08 MED ORDER — FLUPHENAZINE HCL 2.5 MG/ML IJ SOLN: 2.5000 mg | Freq: Three times a day (TID) | INTRAMUSCULAR | Status: DC

## 2021-10-08 MED ORDER — FLUPHENAZINE HCL 5 MG PO TABS: 5.0000 mg | ORAL_TABLET | Freq: Three times a day (TID) | ORAL | Status: DC | PRN

## 2021-10-08 MED ORDER — FLUPHENAZINE HCL 2.5 MG/ML IJ SOLN
1.2500 mg | Freq: Three times a day (TID) | INTRAMUSCULAR | Status: DC
Start: 2021-10-08 — End: 2021-10-09
  Administered 2021-10-08 – 2021-10-09 (×2): 1.25 mg via INTRAMUSCULAR
  Filled 2021-10-08 (×5): qty 0.5
  Filled 2021-10-08: qty 10
  Filled 2021-10-08 (×2): qty 0.5
  Filled 2021-10-08: qty 10
  Filled 2021-10-08: qty 0.5

## 2021-10-08 MED ORDER — IBUPROFEN 800 MG PO TABS
ORAL_TABLET | ORAL | Status: AC
  Filled 2021-10-08: qty 1

## 2021-10-08 NOTE — Progress Notes (Signed)
   10/08/21 2122  Psych Admission Type (Psych Patients Only)  Admission Status Involuntary  Psychosocial Assessment  Patient Complaints Anxiety;Depression  Eye Contact Glaring  Facial Expression Animated;Anxious  Affect Anxious;Irritable  Speech Argumentative;Logical/coherent  Interaction Assertive;Attention-seeking  Motor Activity Fidgety  Appearance/Hygiene Unremarkable  Behavior Characteristics Cooperative;Agitated  Mood Irritable  Thought Process  Coherency Concrete thinking  Content Blaming others  Delusions Persecutory  Perception WDL  Hallucination None reported or observed  Judgment Poor  Confusion None  Danger to Self  Current suicidal ideation? Denies  Danger to Others  Danger to Others None reported or observed  Danger to Others Abnormal  Harmful Behavior to others No threats or harm toward other people  Destructive Behavior No threats or harm toward property

## 2021-10-08 NOTE — Progress Notes (Addendum)
[facility] MD Progress Note  10/08/2021 7:53 AM Erika Rhodes  MRN:  [id number] Subjective:  Erika Rhodes is a 40 year old female with past psychiatric history of MDD with psychosis, PTSD, h/o psychosis and medical history of diabetes, asthma initially presented to [facility] under IVC by family who states patient has not been taking her mental health or diabetes medications and has been manic with reports of being hypnotized and having erratic behaviors.  Chart Review of Past 24 hrs: The patient's chart was reviewed and nursing notes were reviewed. The patient's case was discussed in multidisciplinary team meeting.  Per MAR: - Patient has been non-compliant with all scheduled psychotropic meds, although she was agreeable to returning to Abilify 5 mg.  - PRNs: Maalox x1, Vistaril x1, trazodone x1, Zyprexa x1 for agitation Per RN notes, she had 1 episode of agitation yesterday, has been staff splitting, and has been encouraging other patients to staff split.  However, she is attending some groups, but missing most. Patient sleep hours not documented.  Patient had the following psychiatric recommendations yesterday:  - Resumed Abilify 5 mg nightly   On Assessment Today (11/30): Case was discussed in the multidisciplinary team. MAR was reviewed and patient was compliant with medications. Patient seen, assessed, and discussed with attending Dr. Loleta Chance.  Today, Erika Rhodes is seen in the unit nook and is pleasant, reporting that she slept well last night and her appetite is intact.  She reports that she refused her Abilify after being told that it was 10 mg; when advised that it was 5 mg, as previously discussed. She again stated that she was agreeable to taking 5 mg. She denies residual symptoms as well as new somatic complaints today.  She denies SI/HI/AVH, paranoia and other first-rank sx, and does not vocalize delusions. Of concern, patient has been on the phone with her boyfriend offering misinformation and  attempting to incite staff splitting amongst other patients.   However, on attending assessment approx 1 hour later, she continued to refuse Abilify.  Principal Problem: Bipolar I disorder (HCC) Diagnosis: Principal Problem:   Bipolar I disorder (HCC) Active Problems:   Asthma   Cannabis use disorder, moderate, dependence (HCC)   Diabetes (HCC)   PTSD (post-traumatic stress disorder)   GERD (gastroesophageal reflux disease)  Total Time spent with patient: 30 minutes  Past Psychiatric History: See H&P  Past Medical History:  Past Medical History:  Diagnosis Date   Allergic reaction    Anxiety    Asthma    Depression    DM (diabetes mellitus), gestational    Migraines    Rheumatoid arthritis (HCC) 2016   Diagnosed Dr.  Patsi Sears; has been treated with MTX, Humira, etc, but made her sick.  Was taking CBD oil and stopped as tested positive in a drug screen for work.    Past Surgical History:  Procedure Laterality Date   ABDOMINAL HYSTERECTOMY  2017   Fibroids; Both ovaries intact still   BACK SURGERY     CESAREAN SECTION  2009   CHOLECYSTECTOMY  2010   laparoscopic   TONSILLECTOMY     Family History:  Family History  Problem Relation Age of Onset   Arthritis Mother        Rheumatoid   Depression Daughter        and anxiety   Allergies Son    Family Psychiatric  History: See H&P Social History:  Social History   Substance and Sexual Activity  Alcohol Use Yes   Alcohol/week: 2.0 standard drinks  Types: 2 Glasses of wine per week   Comment: 2 drinks 1-2xmonth     Social History   Substance and Sexual Activity  Drug Use Not Currently    Social History   Socioeconomic History   Marital status: Single    Spouse name: Not on file   Number of children: 5   Years of education: 14   Highest education level: Associate degree: academic program  Occupational History   Not on file  Tobacco Use   Smoking status: Never   Smokeless tobacco: Never  Vaping  Use   Vaping Use: Some days  Substance and Sexual Activity   Alcohol use: Yes    Alcohol/week: 2.0 standard drinks    Types: 2 Glasses of wine per week    Comment: 2 drinks 1-2xmonth   Drug use: Not Currently   Sexual activity: Yes    Birth control/protection: Surgical  Other Topics Concern   Not on file  Social History Narrative   Lives with current boyfriend   See history of present illness for more history 04/22/2020   Social Determinants of Health   Financial Resource Strain: Not on file  Food Insecurity: Not on file  Transportation Needs: Not on file  Physical Activity: Not on file  Stress: Not on file  Social Connections: Not on file   Additional Social History:         Sleep: Fair  Appetite: Fair  Current Medications: Current Facility-Administered Medications  Medication Dose Route Frequency Provider Last Rate Last Admin   acetaminophen (TYLENOL) tablet 650 mg  650 mg Oral Q6H PRN Lindell Spar I, NP   650 mg at 10/08/21 0646   albuterol (VENTOLIN HFA) 108 (90 Base) MCG/ACT inhaler 1-2 puff  1-2 puff Inhalation Q4H PRN Armando Reichert, MD       alum & mag hydroxide-simeth (MAALOX/MYLANTA) 200-200-20 MG/5ML suspension 30 mL  30 mL Oral Q4H PRN Nwoko, Agnes I, NP   30 mL at 10/06/21 0047   ARIPiprazole (ABILIFY) tablet 5 mg  5 mg Oral QHS Rosezetta Schlatter, MD       diphenhydrAMINE (BENADRYL) injection 50 mg  50 mg Intramuscular PRN Rosezetta Schlatter, MD       hydrOXYzine (ATARAX/VISTARIL) tablet 25 mg  25 mg Oral TID PRN Lindell Spar I, NP   25 mg at 10/07/21 2116   magnesium hydroxide (MILK OF MAGNESIA) suspension 30 mL  30 mL Oral Daily PRN Lindell Spar I, NP       metFORMIN (GLUCOPHAGE) tablet 500 mg  500 mg Oral BID WC Rosezetta Schlatter, MD   500 mg at 10/07/21 1800   nicotine (NICODERM CQ - dosed in mg/24 hours) patch 21 mg  21 mg Transdermal Daily PRN Rosezetta Schlatter, MD       OLANZapine zydis (ZYPREXA) disintegrating tablet 5 mg  5 mg Oral Q8H PRN Massengill, Ovid Curd,  MD   5 mg at 10/08/21 [id number]   And   ziprasidone (GEODON) injection 20 mg  20 mg Intramuscular PRN Massengill, Ovid Curd, MD       sodium chloride (OCEAN) 0.65 % nasal spray 1 spray  1 spray Each Nare PRN Rosezetta Schlatter, MD       traZODone (DESYREL) tablet 50 mg  50 mg Oral QHS PRN Lindell Spar I, NP   50 mg at 10/07/21 2116    Lab Results:  Results for orders placed or performed during the hospital encounter of 10/02/21 (from the past 48 hour(s))  Vitamin B12  Status: None   Collection Time: 10/06/21  6:39 PM  Result Value Ref Range   Vitamin B-12 405 180 - 914 pg/mL    Comment: (NOTE) This assay is not validated for testing neonatal or myeloproliferative syndrome specimens for Vitamin B12 levels. Performed at [facility], [place] [address] [place], [place] [address]     Blood Alcohol level:  Lab Results  Component Value Date   [facility] <10 10/01/2021   ETH <10 0000000    Metabolic Disorder Labs: Lab Results  Component Value Date   HGBA1C 7.7 (H) 09/03/2021   MPG 174.29 09/03/2021   MPG 186 05/31/2021   No results found for: PROLACTIN Lab Results  Component Value Date   CHOL 204 (H) 09/03/2021   TRIG 165 (H) 09/03/2021   HDL 41 09/03/2021   CHOLHDL 5.0 09/03/2021   VLDL 33 09/03/2021   LDLCALC 130 (H) 09/03/2021   LDLCALC 92 05/31/2021    Physical Findings:  Musculoskeletal: Strength & Muscle Tone: within normal limits Gait & Station: normal, steady Patient leans: N/A  Psychiatric Specialty Exam:  Presentation  General Appearance: Appropriate for Environment; Casual; Fairly Groomed  Eye Contact:Good  Speech:Clear and Coherent; Normal Rate  Speech Volume:Normal  Handedness:Right   Mood and Affect  Mood: "I am okay."  Affect:Appropriate; Congruent   Thought Process  Thought Processes: Linear, goal directed  Descriptions of Associations:Intact  Orientation:Full (Time, Place and Person)  Thought Content: Logical, denies  SI/HI/AVH.  More perseveration on discharging  History of Schizophrenia/Schizoaffective disorder:No  Duration of Psychotic Symptoms:Greater than six months  Hallucinations:Hallucinations: None  Ideas of Reference:None  Suicidal Thoughts:Suicidal Thoughts: No  Homicidal Thoughts:Homicidal Thoughts: No   Sensorium  Memory:Immediate Fair; Recent Fair  Judgment:Poor  Insight: Poor, shallow   Executive Functions  Concentration:Fair  Attention Span:Fair  [place] of Knowledge:Good  Language:Good   Psychomotor Activity  Psychomotor Activity:Psychomotor Activity: Normal   Assets  Assets:Communication Skills; Housing; Intimacy; Resilience; Social Support   Sleep  Sleep:Sleep: Fair Number of Hours of Sleep: not documented    Physical Exam: Physical Exam Vitals and nursing note reviewed.  Constitutional:      Appearance: Normal appearance.  HENT:     Head: Normocephalic and atraumatic.     Mouth/Throat:     Mouth: Mucous membranes are moist.  Pulmonary:     Effort: Pulmonary effort is normal.  Musculoskeletal:     Cervical back: Normal range of motion. No rigidity or tenderness.  Skin:    Coloration: Skin is not jaundiced or pale.  Neurological:     General: No focal deficit present.     Mental Status: She is alert and oriented to person, place, and time. Mental status is at baseline.     Cranial Nerves: No cranial nerve deficit.     Sensory: No sensory deficit.     Motor: No weakness.     Coordination: Coordination normal.     Gait: Gait normal.   Review of Systems  Constitutional:  Negative for fever.  HENT:  Negative for congestion.   Respiratory:  Negative for shortness of breath.   Cardiovascular:  Negative for chest pain.  Gastrointestinal:  Negative for abdominal pain, constipation, diarrhea, nausea and vomiting.  Genitourinary: Negative.   Musculoskeletal:  Negative for neck pain.  Neurological:  Negative for dizziness and  headaches.   Blood pressure 130/90, pulse (!) 105, temperature 97.8 F (36.6 C), temperature source Oral, resp. rate 17, height 5\' 3"  (1.6 m), weight 86.6 kg,  SpO2 99 %. Body mass index is 33.83 kg/m.   Treatment Plan Summary: Agatha is a 40 year old female with past psychiatric history of bipolar 1 disorder with psychotic features, cluster B personality traits, PTSD, and medical history of type II diabetes presenting from [facility] under IVC by family for mania with bizarre statements and behaviors.  While on the unit, patient was initially irritable, refusing all medications, but has since participated in care appropriately. Today, she is cooperative with medications, but will be placed on unit restrictions after refusing to clean up after herself off the unit and inciting other patients to staff split.  Daily contact with patient to assess and evaluate symptoms and progress in treatment and Medication management  Labs reviewed CBG 194 EKG pending; most recent Qtc 477. Baseline B12 405  Safety and Monitoring --  Admission to inpatient psychiatric unit for safety, stabilization and treatment -- Daily contact with patient to assess and evaluate symptoms and progress in treatment -- Patient's case to be discussed in multi-disciplinary team meeting. -- Patient will be encouraged to participate in the therapeutic group milieu. -- Observation Level : q15 minute checks -- Vital signs:  q12 hours -- Precautions: suicide, elopement, assault.    Plan  -Monitor Vitals. -Monitor for Suicidal Ideation. -Monitor for withdrawal symptoms. -Monitor for medication side effects.   # Bipolar 1 disorder, with psychosis Due to increased somnolence, and concern for EPS/dystonia, which patient reported after receiving insulin, Geodon discontinued. One time Benadryl IM received.  -Discontinue Abilify 5 mg nightly for now. May restart after this episode resolves. Started prolixin 2.5mg  PO TID or 1.25mg  IM  -  Patient to have phone privileges revoked, as she has been irresponsibly using the phone to misinform boyfriend, leading to [employer] erroneously being called. She can have incoming calls if in good behavioral control for up to 15 minutes.  -Continue agitation protocol Zyprexa/Ativan/Geodon - IM Benadryl added to MAR PRN EPS.   # Asthma -Albuterol inhaler as needed.  #Nasal congestion - Afrin 1 spray each nare twice daily x2 days complete - Ocean saline nasal spray available as needed congestion   # Diabetes -Discontinue monitoring daily CBG's, as patient switched to oral antihyperglycemic agent. -Continue metformin 500 mg BID; patient agreeable. -B12 level WNL; baseline obtained as medication can deplete levels.   # Nicotine dependence -Continue nicotine patch daily, patient refusing x2 doses, moved to as needed   PRN's  -Continue Tylenol 650 mg every 6 hours as needed for pain or fever -Continue Milk of Magnesia 30 ml PRN Daily for Constipation. -Continue Maalox/Mylanta 30 ml Q4H PRN for Indigestion. -Continue Hydroxyzine 25 mg TID PRN for Anxiety. -Continue Trazodone 50 mg QHS PRN for sleep.  -benadryl 50mg  PO Q6H cramping or EPS   Discharge Planning: Social work and case management to assist with discharge planning and identification of hospital follow-up needs prior to discharge Estimated LOS: 5 to 7 days  Discharge Concerns: Need to establish a safety plan; Medication compliance and effectiveness Discharge Goals: Return home with outpatient referrals for mental health follow-up including medication management/psychotherapy   Rosezetta Schlatter, MD PGY-1 10/08/2021, 7:53 AM

## 2021-10-08 NOTE — Progress Notes (Addendum)
[facility] Second Physician Opinion Progress Note for Medication Administration to Non-consenting Patients (For Involuntarily Committed Patients)  Patient: Erika Rhodes Date of Birth: [id number] MRN: [id number]   Reason for the Medication: The patient, without the benefit of the specific treatment measure, is incapable of participating in any available treatment plan that will give the patient a realistic opportunity of improving the patient's condition.   Consideration of Side Effects: Consideration of the side effects related to the medication plan has been given.   Rationale for Medication Administration: Patient has consistently decompensated after discharge when she becomes medication non-compliant.     At the present time, patient lacks capacity to understand the need for treatment, refusing to take psychotropic medication, because of poor insight and judgment.  Patient states that they have no problem, no mental illness and refuses to consider taking medication, recommended by the psychiatrist Dr. Adaline Sill.  Diagnosis: Bipolar disorder, type I    Treatment plan/Opinion: -patient is mentally ill, as needs hospitalization -patient continues to display active symptoms of mood lability, poor sleep, distractibility, irritability -patient is unable to rationally discuss medication options, risks versus benefit due to their psychiatric symptoms. -treatment with medication and medication changes would be in their best interest -psychotropics are effective treatment for symptoms of bipolar disorder   Based on evaluation: Mood stabilizing medications such as lithium, traditional antiepileptic mood stabilizers, and antipsychotic medications with evidence for mood stabilization    Cristy Hilts, MD 10/08/21  4:03 PM   This documentation is good for (7) seven days from the date of the MD signature. New documentation must be completed every seven (7) days with detailed justification in the  medical record if the patient requires continued non-emergent administration of psychotropic medications.

## 2021-10-08 NOTE — Group Note (Signed)
Recreation Therapy Group Note   Group Topic:Stress Management  Group Date: 10/08/2021 Start Time: 0930 End Time: [id number] Facilitators: Caroll Rancher, LRT,CTRS Location: [address] Dayroom   Goal Area(s) Addresses:  Patient will actively participate in stress management techniques presented during session.  Patient will successfully identify benefit of practicing stress management post d/c.    Group Description: Guided Imagery. LRT provided education, instruction, and demonstration on practice of visualization via guided imagery. Patient was asked to participate in the technique introduced during session. LRT debriefed including topics of mindfulness, stress management and specific scenarios each patient could use these techniques. Patients were given suggestions of ways to access scripts post d/c and encouraged to explore Youtube and other apps available on smartphones, tablets, and computers.   Affect/Mood: N/A   Participation Level: Did not attend    Clinical Observations/Individualized Feedback:     Plan: Continue to engage patient in RT group sessions 2-3x/week.   Caroll Rancher, LRT,CTRS 10/08/2021 1:34 PM

## 2021-10-08 NOTE — Progress Notes (Signed)
The patient attended the evening N.A.meeting and was appropriate.  

## 2021-10-08 NOTE — Group Note (Signed)
[name] Group Therapy Note   Group Date: 10/08/2021 Start Time: 1300 End Time: 1400  Type of Therapy and Topic:  Group Therapy:  Self-Esteem   Participation Level:  Did not attend  Description of Group: This group addressed positive self-esteem. Patients were given a worksheet with a blank shield. Patients were asked what a shield is and when it is used. Patients were asked to list, draw, or write protective factors in the their lives on their shields. Patients discussed the words, ideas and drawings that they put on their shield. Patients were encouraged to have a daily reflection of positive characteristics/ protective factors.  Therapeutic Goals Patient will verbalize two of their positive qualities Patient will demonstrate insight but naming social supports in their lives Patient will verbalize their feelings when voicing positive self affirmations and when voicing positive affirmations of others Patients will discuss the potential positive impact on their wellness/recovery of focusing on positive traits of self and others.  Summary of Patient Progress: Did not attend   [name], LCSWA 10/08/2021  1:52 PM

## 2021-10-08 NOTE — Progress Notes (Signed)
Pt. Refused the PO route for medication on three separate attempts. Nurse reviewed medication with the patient. Nurse was accompanied with other staff nurse. Pt. Sat still on bed and indicated which shoulder.  Nurse talked to doctor and doctor clarified that was the order.

## 2021-10-08 NOTE — Progress Notes (Signed)
   10/07/21 2328  Psych Admission Type (Psych Patients Only)  Admission Status Involuntary  Psychosocial Assessment  Patient Complaints Anxiety  Eye Contact Glaring  Facial Expression Anxious  Affect Anxious;Apathetic;Irritable  Speech Argumentative  Interaction Attention-seeking;Childlike;Demanding  Motor Activity Pacing;Restless  Appearance/Hygiene Unremarkable  Behavior Characteristics Cooperative;Calm  Mood Depressed  Thought Process  Coherency Concrete thinking  Content Ambivalence;Blaming others  Delusions Grandeur;Persecutory  Perception WDL  Hallucination None reported or observed  Judgment Limited  Confusion None  Danger to Self  Current suicidal ideation? Denies  Danger to Others  Danger to Others None reported or observed  Danger to Others Abnormal  Harmful Behavior to others No threats or harm toward other people  Destructive Behavior No threats or harm toward property

## 2021-10-08 NOTE — BH IP Treatment Plan (Signed)
Interdisciplinary Treatment and Diagnostic Plan Update  10/08/2021  Erika Rhodes MRN: [id number]  Principal Diagnosis: Bipolar I disorder (HCC)  Secondary Diagnoses: Principal Problem:   Bipolar I disorder (HCC) Active Problems:   Asthma   Cannabis use disorder, moderate, dependence (HCC)   Diabetes (HCC)   PTSD (post-traumatic stress disorder)   GERD (gastroesophageal reflux disease)   Current Medications:  Current Facility-Administered Medications  Medication Dose Route Frequency Provider Last Rate Last Admin   acetaminophen (TYLENOL) tablet 650 mg  650 mg Oral Q6H PRN Armandina Stammer I, NP   650 mg at 10/08/21 0646   albuterol (VENTOLIN HFA) 108 (90 Base) MCG/ACT inhaler 1-2 puff  1-2 puff Inhalation Q4H PRN Karsten Ro, MD       alum & mag hydroxide-simeth (MAALOX/MYLANTA) 200-200-20 MG/5ML suspension 30 mL  30 mL Oral Q4H PRN Armandina Stammer I, NP   30 mL at 10/06/21 0047   ARIPiprazole (ABILIFY) tablet 5 mg  5 mg Oral QHS [name], MD       diphenhydrAMINE (BENADRYL) injection 50 mg  50 mg Intramuscular PRN [name], MD       hydrOXYzine (ATARAX/VISTARIL) tablet 25 mg  25 mg Oral TID PRN Armandina Stammer I, NP   25 mg at 10/08/21 1153   ibuprofen (ADVIL) tablet 800 mg  800 mg Oral Once Massengill, Harrold Donath, MD       magnesium hydroxide (MILK OF MAGNESIA) suspension 30 mL  30 mL Oral Daily PRN Nwoko, Nicole Kindred I, NP       metFORMIN (GLUCOPHAGE) tablet 500 mg  500 mg Oral BID WC [name], MD   500 mg at 10/08/21 2297   nicotine (NICODERM CQ - dosed in mg/24 hours) patch 21 mg  21 mg Transdermal Daily PRN [name], MD       OLANZapine zydis (ZYPREXA) disintegrating tablet 5 mg  5 mg Oral Q8H PRN Massengill, Harrold Donath, MD   5 mg at 10/08/21 9892   And   ziprasidone (GEODON) injection 20 mg  20 mg Intramuscular PRN Massengill, Harrold Donath, MD       sodium chloride (OCEAN) 0.65 % nasal spray 1 spray  1 spray Each Nare PRN [name], MD       traZODone (DESYREL) tablet  50 mg  50 mg Oral QHS PRN Armandina Stammer I, NP   50 mg at 10/07/21 2116   PTA Medications: Medications Prior to Admission  Medication Sig Dispense Refill Last Dose   albuterol (VENTOLIN HFA) 108 (90 Base) MCG/ACT inhaler Inhale 1-2 puffs into the lungs every 4 (four) hours as needed for wheezing or shortness of breath. 1 each 0    doxycycline (VIBRA-TABS) 100 MG tablet Take 100 mg by mouth 2 (two) times daily.      melatonin 3 MG TABS tablet Take 2 tablets (6 mg total) by mouth at bedtime. 20 tablet 0    oxymetazoline (NASAL SPRAY 12 HOUR) 0.05 % nasal spray Place 1 spray into both nostrils 2 (two) times daily as needed for congestion.       Patient Stressors: Marital or family conflict   Medication change or noncompliance    Patient Strengths: Active sense of humor  Physical Health   Treatment Modalities: Medication Management, Group therapy, Case management,  1 to 1 session with clinician, Psychoeducation, Recreational therapy.   Physician Treatment Plan for Primary Diagnosis: Bipolar I disorder (HCC) Long Term Goal(s): Improvement in symptoms so as ready for discharge   Short Term Goals: Ability to identify changes  in lifestyle to reduce recurrence of condition will improve Ability to verbalize feelings will improve Ability to disclose and discuss suicidal ideas Ability to demonstrate self-control will improve Ability to identify and develop effective coping behaviors will improve Ability to maintain clinical measurements within normal limits will improve Compliance with prescribed medications will improve Ability to identify triggers associated with substance abuse/mental health issues will improve  Medication Management: Evaluate patient's response, side effects, and tolerance of medication regimen.  Therapeutic Interventions: 1 to 1 sessions, Unit Group sessions and Medication administration.  Evaluation of Outcomes: Progressing  Physician Treatment Plan for Secondary  Diagnosis: Principal Problem:   Bipolar I disorder (HCC) Active Problems:   Asthma   Cannabis use disorder, moderate, dependence (HCC)   Diabetes (HCC)   PTSD (post-traumatic stress disorder)   GERD (gastroesophageal reflux disease)  Long Term Goal(s): Improvement in symptoms so as ready for discharge   Short Term Goals: Ability to identify changes in lifestyle to reduce recurrence of condition will improve Ability to verbalize feelings will improve Ability to disclose and discuss suicidal ideas Ability to demonstrate self-control will improve Ability to identify and develop effective coping behaviors will improve Ability to maintain clinical measurements within normal limits will improve Compliance with prescribed medications will improve Ability to identify triggers associated with substance abuse/mental health issues will improve     Medication Management: Evaluate patient's response, side effects, and tolerance of medication regimen.  Therapeutic Interventions: 1 to 1 sessions, Unit Group sessions and Medication administration.  Evaluation of Outcomes: Progressing   RN Treatment Plan for Primary Diagnosis: Bipolar I disorder (HCC) Long Term Goal(s): Knowledge of disease and therapeutic regimen to maintain health will improve  Short Term Goals: Ability to verbalize feelings will improve, Ability to identify and develop effective coping behaviors will improve, and Compliance with prescribed medications will improve  Medication Management: RN will administer medications as ordered by provider, will assess and evaluate patient's response and provide education to patient for prescribed medication. RN will report any adverse and/or side effects to prescribing provider.  Therapeutic Interventions: 1 on 1 counseling sessions, Psychoeducation, Medication administration, Evaluate responses to treatment, Monitor vital signs and CBGs as ordered, Perform/monitor CIWA, COWS, AIMS and Fall  Risk screenings as ordered, Perform wound care treatments as ordered.  Evaluation of Outcomes: Progressing   [name] Treatment Plan for Primary Diagnosis: Bipolar I disorder (HCC) Long Term Goal(s): Safe transition to appropriate next level of care at discharge, Engage patient in therapeutic group addressing interpersonal concerns.  Short Term Goals: Engage patient in aftercare planning with referrals and resources, Increase social support, and Increase ability to appropriately verbalize feelings  Therapeutic Interventions: Assess for all discharge needs, 1 to 1 time with Social worker, Explore available resources and support systems, Assess for adequacy in community support network, Educate family and significant other(s) on suicide prevention, Complete Psychosocial Assessment, Interpersonal group therapy.  Evaluation of Outcomes: Progressing   Progress in Treatment: Attending groups: No. Participating in groups: No. Taking medication as prescribed: Yes. Toleration medication: Yes. Family/Significant other contact made: No, will contact:  pt declined consents Patient understands diagnosis: No. Discussing patient identified problems/goals with staff: Yes. Medical problems stabilized or resolved: Yes. Denies suicidal/homicidal ideation: Yes. Issues/concerns per patient self-inventory: No. Other: None  New problem(s) identified: No, Describe:  None  New Short Term/Long Term Goal(s):medication stabilization, elimination of SI thoughts, development of comprehensive mental wellness plan.   Patient Goals:  "getting out."  Discharge Plan or Barriers: Pt is declining all f/u  at this time.   Reason for Continuation of Hospitalization: Medication stabilization  Estimated Length of Stay: 3-5 days   Scribe for Treatment Team: Chrys Racer 10/08/2021 1:55 PM

## 2021-10-08 NOTE — Group Note (Signed)
Date:  10/08/2021 Time:  10:36 AM  Group Topic/Focus:  Goals Group:   The focus of this group is to help patients establish daily goals to achieve during treatment and discuss how the patient can incorporate goal setting into their daily lives to aide in recovery.    Participation Level:  None  Participation Quality:  Inattentive  Affect:  Resistant  Cognitive:  Lacking  Insight: Lacking  Engagement in Group:  None  Modes of Intervention:  Discussion  Additional Comments:  Patient was in group but did not participate.  Reymundo Poll 10/08/2021, 10:36 AM

## 2021-10-08 NOTE — Progress Notes (Addendum)
   10/08/21 0819  Psych Admission Type (Psych Patients Only)  Admission Status Involuntary  Psychosocial Assessment  Patient Complaints Anxiety;Depression  Eye Contact Glaring  Facial Expression Anxious  Affect Anxious;Apathetic;Irritable  Speech Argumentative  Interaction Attention-seeking;Childlike;Demanding  Motor Activity Pacing;Restless  Appearance/Hygiene Unremarkable  Behavior Characteristics Anxious;Agitated;Impulsive;Intrusive;Irritable  Mood Depressed;Anxious;Suspicious;Irritable;Preoccupied  Thought Administrator, sports thinking  Content Ambivalence;Blaming others  Delusions Grandeur;Persecutory  Perception WDL  Hallucination None reported or observed  Judgment Limited  Confusion None  Danger to Self  Current suicidal ideation? Denies  Danger to Others  Danger to Others None reported or observed  Danger to Others Abnormal  Harmful Behavior to others No threats or harm toward other people  Destructive Behavior No threats or harm toward property   D: Patient  denies SI/HI/AVH. Pt. Rated anxiety  8/10 and denied depression. Pt complained of head ache pain 8/10. Pt. Was out in open areas and talking on the phone. Pt requested 800 Advil for HA. A:  Patient took scheduled medicine.  Pt. Was offered 800 mg of Advil for HA. Pt. Denied and stated that her head ache was better. Support and encouragement provided Routine safety checks conducted every 15 minutes. Patient  Informed to notify staff with any concerns.   R:  Safety maintained.   Late note: 1700: Pt. Has threatened to sue because she says that we are changing the rules on her all the time. Pt. Is refusing some meds and ekg.  @1725  Pt. Refused EKG.

## 2021-10-08 NOTE — BHH Group Notes (Signed)
Patient di not attend group.

## 2021-10-09 LAB — LIPID PANEL
Cholesterol: 183 mg/dL (ref 0–200)
HDL: 41 mg/dL (ref >40)
LDL Cholesterol: 103 mg/dL — ABNORMAL HIGH (ref 0–99)
Total CHOL/HDL Ratio: 4.5 RATIO
Triglycerides: 195 mg/dL — ABNORMAL HIGH (ref <150)
VLDL: 39 mg/dL (ref 0–40)

## 2021-10-09 LAB — MAGNESIUM: Magnesium: 2 mg/dL (ref 1.7–2.4)

## 2021-10-09 LAB — BASIC METABOLIC PANEL
Anion gap: 7 (ref 5–15)
BUN: 12 mg/dL (ref 6–20)
CO2: 24 mmol/L (ref 22–32)
Calcium: 9 mg/dL (ref 8.9–10.3)
Chloride: 105 mmol/L (ref 98–111)
Creatinine, Ser: 0.82 mg/dL (ref 0.44–1.00)
GFR, Estimated: >60 mL/min (ref >60)
Glucose, Bld: 163 mg/dL — ABNORMAL HIGH (ref 70–99)
Potassium: 3.6 mmol/L (ref 3.5–5.1)
Sodium: 136 mmol/L (ref 135–145)

## 2021-10-09 LAB — HEMOGLOBIN A1C
Hgb A1c MFr Bld: 7.6 % — ABNORMAL HIGH (ref 4.8–5.6)
Mean Plasma Glucose: 171.42 mg/dL

## 2021-10-09 LAB — AMMONIA: Ammonia: 29 umol/L (ref 9–35)

## 2021-10-09 MED ORDER — FLUPHENAZINE HCL 5 MG PO TABS
5.0000 mg | ORAL_TABLET | Freq: Three times a day (TID) | ORAL | Status: DC
  Administered 2021-10-09 – 2021-10-10 (×3): 5 mg via ORAL
  Filled 2021-10-09 (×2): qty 21
  Filled 2021-10-09: qty 1
  Filled 2021-10-09 (×4): qty 21
  Filled 2021-10-09 (×2): qty 1

## 2021-10-09 MED ORDER — FLUPHENAZINE HCL 2.5 MG/ML IJ SOLN
2.5000 mg | Freq: Three times a day (TID) | INTRAMUSCULAR | Status: DC
  Filled 2021-10-09 (×8): qty 1

## 2021-10-09 NOTE — Progress Notes (Addendum)
[facility] MD Progress Note  10/09/2021 7:12 AM [name]  MRN:  [id number] Subjective:  Erika Rhodes is a 40 year old female with past psychiatric history of MDD with psychosis, PTSD, h/o psychosis and medical history of diabetes, asthma initially presented to [facility] under IVC by family who states patient has not been taking her mental health or diabetes medications and has been manic with reports of being hypnotized and having erratic behaviors.  Chart Review of Past 24 hrs: The patient's chart was reviewed and nursing notes were reviewed. The patient's case was discussed in multidisciplinary team meeting.  Per MAR: - Patient was initiated on a forced medication protocol after non-compliance with all scheduled psychotropic meds. - PRNs: Vistaril x1, trazodone x1, Tylenol x1 Per RN notes, she had 1 episode of agitation yesterday, has been staff splitting, and has been encouraging other patients to staff split.  However, she is attending some groups, but missing most. Patient sleep hours not documented.  Patient had the following psychiatric recommendations yesterday:  - Per forced medication protocol, patient was initiated on Prolixin 2.5 mg p.o. 3 times daily or 1.25 mg IM 3 times daily if refusal of p.o. dose.  Agitation protocol: Prolixin 5 mg p.o. or 2.5 mg IM with Ativan 2 mg, and Benadryl 50 mg (both p.o. or IM)  On Assessment Today (12/1): Case was discussed in the multidisciplinary team. MAR was reviewed and patient was compliant with medications. Patient seen, assessed, and discussed with attending Dr. Berdine Addison.  Today, Erika Rhodes is seen in her room and reports that her mood is good, that she slept well last night and her appetite is increased.  She reports that she feels more sedated by the medication, but she has been walking around on the unit unchanged from previous days in terms of timing and amount of walking.  She reports a headache this morning resolved by Tylenol but denies further  somatic complaints today.  She denies SI/HI/AVH, paranoia and other first-rank sx, and does not vocalize delusions.  Due to staff splitting and inciting other patients, Erika Rhodes was informed that she will be moved to the [place] for more supervision.  She voiced understanding.  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  Lamarr Lulas; has been treated with MTX, Humira, etc, but made her sick.  Was taking CBD oil and stopped as tested positive in a drug screen for work.    Past Surgical History:  Procedure Laterality Date   ABDOMINAL HYSTERECTOMY  2017   Fibroids; Both ovaries intact still   BACK SURGERY     CESAREAN SECTION  2009   CHOLECYSTECTOMY  2010   laparoscopic   TONSILLECTOMY     Family History:  Family History  Problem Relation Age of Onset   Arthritis Mother        Rheumatoid   Depression Daughter  and anxiety   Allergies Son    Family Psychiatric  History: See H&P Social History:  Social History   Substance and Sexual Activity  Alcohol Use Yes   Alcohol/week: 2.0 standard drinks   Types: 2 Glasses of wine per week   Comment: 2 drinks 1-2xmonth     Social History   Substance and Sexual Activity  Drug Use Not Currently    Social History   Socioeconomic History   Marital status: Single    Spouse name: Not on file   Number of children: 5   Years of education: 14    Highest education level: Associate degree: academic program  Occupational History   Not on file  Tobacco Use   Smoking status: Never   Smokeless tobacco: Never  Vaping Use   Vaping Use: Some days  Substance and Sexual Activity   Alcohol use: Yes    Alcohol/week: 2.0 standard drinks    Types: 2 Glasses of wine per week    Comment: 2 drinks 1-2xmonth   Drug use: Not Currently   Sexual activity: Yes    Birth control/protection: Surgical  Other Topics Concern   Not on file  Social History Narrative   Lives with current boyfriend   See history of present illness for more history 04/22/2020   Social Determinants of Health   Financial Resource Strain: Not on file  Food Insecurity: Not on file  Transportation Needs: Not on file  Physical Activity: Not on file  Stress: Not on file  Social Connections: Not on file   Additional Social History:         Sleep: Fair  Appetite: Fair  Current Medications: Current Facility-Administered Medications  Medication Dose Route Frequency Provider Last Rate Last Admin   acetaminophen (TYLENOL) tablet 650 mg  650 mg Oral Q6H PRN Lindell Spar I, NP   650 mg at 10/08/21 0646   albuterol (VENTOLIN HFA) 108 (90 Base) MCG/ACT inhaler 1-2 puff  1-2 puff Inhalation Q4H PRN Armando Reichert, MD       alum & mag hydroxide-simeth (MAALOX/MYLANTA) 200-200-20 MG/5ML suspension 30 mL  30 mL Oral Q4H PRN Lindell Spar I, NP   30 mL at 10/06/21 0047   fluPHENAZine (PROLIXIN) tablet 5 mg  5 mg Oral TID PRN Maida Sale, MD       And   LORazepam (ATIVAN) tablet 2 mg  2 mg Oral TID PRN Maida Sale, MD       And   diphenhydrAMINE (BENADRYL) capsule 50 mg  50 mg Oral Q6H PRN Christan Ciccarelli, Jackie Plum, MD       diphenhydrAMINE (BENADRYL) capsule 50 mg  50 mg Oral Q6H PRN Merlene Dante, Jackie Plum, MD       diphenhydrAMINE (BENADRYL) injection 50 mg  50 mg Intramuscular PRN Rosezetta Schlatter, MD       fluPHENAZine (PROLIXIN) injection 2.5 mg  2.5 mg  Intramuscular TID PRN Maida Sale, MD       And   LORazepam (ATIVAN) injection 2 mg  2 mg Intramuscular TID PRN Maida Sale, MD       And   diphenhydrAMINE (BENADRYL) injection 50 mg  50 mg Intramuscular TID PRN Maida Sale, MD       fluPHENAZine (PROLIXIN) tablet 2.5 mg  2.5 mg Oral TID Maida Sale, MD       Or   fluPHENAZine (PROLIXIN) injection 1.25 mg  1.25 mg Intramuscular TID Maida Sale, MD   1.25 mg  at 10/08/21 1921   hydrOXYzine (ATARAX/VISTARIL) tablet 25 mg  25 mg Oral TID PRN Armandina Stammer I, NP   25 mg at 10/08/21 1153   magnesium hydroxide (MILK OF MAGNESIA) suspension 30 mL  30 mL Oral Daily PRN Armandina Stammer I, NP       metFORMIN (GLUCOPHAGE) tablet 500 mg  500 mg Oral BID WC [name], MD   500 mg at 10/08/21 1645   nicotine (NICODERM CQ - dosed in mg/24 hours) patch 21 mg  21 mg Transdermal Daily PRN [name], MD       sodium chloride (OCEAN) 0.65 % nasal spray 1 spray  1 spray Each Nare PRN [name], MD       traZODone (DESYREL) tablet 50 mg  50 mg Oral QHS PRN Armandina Stammer I, NP   50 mg at 10/08/21 2122    Lab Results:  No results found for this or any previous visit (from the past 48 hour(s)).   Blood Alcohol level:  Lab Results  Component Value Date   ETH <10 10/01/2021   ETH <10 09/24/2021    Metabolic Disorder Labs: Lab Results  Component Value Date   HGBA1C 7.7 (H) 09/03/2021   MPG 174.29 09/03/2021   MPG 186 05/31/2021   No results found for: PROLACTIN Lab Results  Component Value Date   CHOL 204 (H) 09/03/2021   TRIG 165 (H) 09/03/2021   HDL 41 09/03/2021   CHOLHDL 5.0 09/03/2021   VLDL 33 09/03/2021   LDLCALC 130 (H) 09/03/2021   LDLCALC 92 05/31/2021    Physical Findings:  Musculoskeletal: Strength & Muscle Tone: within normal limits Gait & Station: normal, steady Patient leans: N/A  Psychiatric Specialty Exam:  Presentation  General Appearance: Appropriate for  Environment; Casual; Fairly Groomed  Eye Contact:Good  Speech:Clear and Coherent; Normal Rate  Speech Volume:Normal  Handedness:Right   Mood and Affect  Mood: "I am okay."  Affect:Appropriate; Congruent   Thought Process  Thought Processes: Linear, goal directed  Descriptions of Associations:Intact  Orientation:Full (Time, Place and Person)  Thought Content: Logical, denies SI/HI/AVH.  More perseveration on discharging  History of Schizophrenia/Schizoaffective disorder:No  Duration of Psychotic Symptoms:Greater than six months  Hallucinations: Denies  Ideas of Reference:None  Suicidal Thoughts: Denies  Homicidal Thoughts: Denies   Sensorium  Memory:Immediate Fair; Recent Fair  Judgment:Poor  Insight: Poor, shallow   Executive Functions  Concentration:Fair  Attention Span:Fair  Recall:Fair  [employer] of Knowledge:Good  Language:Good   Psychomotor Activity  Psychomotor Activity:No data recorded   Assets  Assets:Communication Skills; Housing; Intimacy; Resilience; Social Support   Sleep  Sleep:Sleep: Fair Number of Hours of Sleep: not documented    Physical Exam: Physical Exam Vitals and nursing note reviewed.  Constitutional:      Appearance: Normal appearance.  HENT:     Head: Normocephalic and atraumatic.     Mouth/Throat:     Mouth: Mucous membranes are moist.  Pulmonary:     Effort: Pulmonary effort is normal.  Musculoskeletal:     Cervical back: Normal range of motion. No rigidity or tenderness.  Skin:    Coloration: Skin is not jaundiced or pale.  Neurological:     General: No focal deficit present.     Mental Status: She is alert and oriented to person, place, and time. Mental status is at baseline.     Cranial Nerves: No cranial nerve deficit.     Sensory: No sensory deficit.     Motor: No weakness.  Coordination: Coordination normal.     Gait: Gait normal.   Review of Systems  Constitutional:  Negative for fever.   HENT:  Negative for congestion.   Respiratory:  Negative for shortness of breath.   Cardiovascular:  Negative for chest pain.  Gastrointestinal:  Negative for abdominal pain, constipation, diarrhea, nausea and vomiting.  Genitourinary: Negative.   Musculoskeletal:  Negative for neck pain.  Neurological:  Negative for dizziness and headaches.   Blood pressure 108/79, pulse (!) 55, temperature 97.6 F (36.4 C), temperature source Oral, resp. rate 17, height 5\' 3"  (1.6 m), weight 86.6 kg, SpO2 97 %. Body mass index is 33.83 kg/m.   Treatment Plan Summary: Conception is a 40 year old female with past psychiatric history of bipolar 1 disorder with psychotic features, cluster B personality traits, PTSD, and medical history of type II diabetes presenting [facility] under IVC by family for mania with bizarre statements and behaviors.  While on the unit, patient was initially irritable, refusing all medications, but has since participated in care appropriately.  She is currently on forced medication protocol for continued mania, and will be placed on [address] for closer observation after increase increase staff splitting and inciting other patients to staff split.  Daily contact with patient to assess and evaluate symptoms and progress in treatment and Medication management  Labs reviewed CBG 194 EKG pending; most recent Qtc 477. Baseline B12 405  Safety and Monitoring --  Admission to inpatient psychiatric unit for safety, stabilization and treatment -- Daily contact with patient to assess and evaluate symptoms and progress in treatment -- Patient's case to be discussed in multi-disciplinary team meeting. -- Patient will be encouraged to participate in the therapeutic group milieu. -- Observation Level : q15 minute checks -- Vital signs:  q12 hours -- Precautions: suicide, elopement, assault.    Plan  -Monitor Vitals. -Monitor for Suicidal Ideation. -Monitor for withdrawal  symptoms. -Monitor for medication side effects.   # Bipolar 1 disorder, with psychosis Due to increased somnolence, and concern for EPS/dystonia, which patient reported after receiving insulin, Geodon discontinued. One time Benadryl IM received.  -Discontinue Abilify 5 mg nightly for now. May restart after this episode resolves. -Increase prolixin to 5mg  PO TID or 2.5mg  IM 3 times daily per forced medication protocol - Patient to have phone privileges revoked, as she has been irresponsibly using the phone to misinform boyfriend, leading to [employer] erroneously being called. She can have incoming calls if in good behavioral control for up to 15 minutes.  -Continue agitation protocol Zyprexa/Ativan/Geodon - IM Benadryl added to MAR PRN EPS.   # Asthma -Albuterol inhaler as needed.  #Nasal congestion - Afrin 1 spray each nare twice daily x2 days complete - Ocean saline nasal spray available as needed congestion   # Diabetes -Discontinue monitoring daily CBG's, as patient switched to oral antihyperglycemic agent. -Continue metformin 500 mg BID; patient agreeable. -B12 level WNL; baseline obtained as medication can deplete levels.   # Nicotine dependence -Continue nicotine patch daily, patient refusing x2 doses, moved to as needed   PRN's  -Continue Tylenol 650 mg every 6 hours as needed for pain or fever -Continue Milk of Magnesia 30 ml PRN Daily for Constipation. -Continue Maalox/Mylanta 30 ml Q4H PRN for Indigestion. -Continue Hydroxyzine 25 mg TID PRN for Anxiety. -Continue Trazodone 50 mg QHS PRN for sleep.  -Continue benadryl 50mg  PO Q6H cramping or EPS   Discharge Planning: Social work and case management to assist with discharge planning and identification  of hospital follow-up needs prior to discharge Estimated LOS: 5 to 7 days  Discharge Concerns: Need to establish a safety plan; Medication compliance and effectiveness Discharge Goals: Return home with outpatient referrals for  mental health follow-up including medication management/psychotherapy   Rosezetta Schlatter, MD PGY-1 10/09/2021, 7:12 AM

## 2021-10-09 NOTE — [facility] Group Notes (Signed)
[facility] Group Notes:  (Nursing/MHT/Case Management/Adjunct)  Date:  10/09/2021  Time:  8:39 PM  Type of Therapy:  Psychoeducational Skills  Participation Level:  Active  Participation Quality:  Appropriate  Affect:  Appropriate  Cognitive:  Appropriate  Insight:  Appropriate and Good  Engagement in Group:  Engaged  Modes of Intervention:  Discussion  Summary of Progress/Problems: Pt. Was active in group on today.  She shared with the group how today was great due to her meeting nice people.    Erika Rhodes [place] 10/09/2021, 8:39 PM

## 2021-10-09 NOTE — [facility] Group Notes (Signed)
Orientation group and PsychoED group. Patients were given a poem by Buddist Philosopher [place] regarding the power of our thoughts. Patients were then given examples of how positive reframing can help shape our thoughts and impact our mood. Patients were then asked to explore one negative thought they would like to let go of. Patient sat for a few minutes and then left group early.

## 2021-10-09 NOTE — Progress Notes (Signed)
   10/09/21 1202  Psych Admission Type (Psych Patients Only)  Admission Status Involuntary  Psychosocial Assessment  Patient Complaints Anxiety  Eye Contact Glaring  Facial Expression Animated;Anxious  Affect Anxious;Irritable  Speech Argumentative;Logical/coherent  Interaction Assertive;Attention-seeking  Motor Activity Fidgety  Appearance/Hygiene Unremarkable  Behavior Characteristics Anxious;Agitated;Fidgety;Impulsive;Intrusive;Irritable  Mood Depressed;Anxious;Angry  Thought Process  Coherency Concrete thinking  Content Blaming others  Delusions None reported or observed  Perception WDL  Hallucination None reported or observed  Judgment Poor  Confusion None  Danger to Self  Current suicidal ideation? Denies  Danger to Others  Danger to Others None reported or observed  Danger to Others Abnormal  Harmful Behavior to others No threats or harm toward other people  Destructive Behavior No threats or harm toward property   D: Patient denies SI/HI/AVH. Pt. Rated anxiety 10/10 and denied depression. Pt. Was pacing in the hall.  A:  Patient refused PO Prolixin and was given 1.25 mg of prolixin IM, Pt. Approached the nurse and asked to take the PO dose of prolixin and did take the prolixin orally.  Support and encouragement provided Routine safety checks conducted every 15 minutes. Patient  Informed to notify staff with any concerns.   R:  Safety maintained.

## 2021-10-10 MED ORDER — TRAZODONE HCL 50 MG PO TABS: 50.0000 mg | ORAL_TABLET | Freq: Every evening | ORAL | 0 refills | Status: DC | PRN

## 2021-10-10 MED ORDER — FLUPHENAZINE HCL 5 MG PO TABS: 5.0000 mg | ORAL_TABLET | Freq: Three times a day (TID) | ORAL | 0 refills | Status: DC

## 2021-10-10 MED ORDER — METFORMIN HCL 500 MG PO TABS: 500.0000 mg | ORAL_TABLET | Freq: Two times a day (BID) | ORAL | 0 refills | Status: DC

## 2021-10-10 MED ORDER — HYDROXYZINE HCL 25 MG PO TABS: 25.0000 mg | ORAL_TABLET | Freq: Three times a day (TID) | ORAL | 0 refills | Status: DC | PRN

## 2021-10-10 NOTE — [facility] Group Notes (Signed)
Patient did not attend group.
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
## 2021-10-10 NOTE — Progress Notes (Signed)
   10/09/21 2020  Psych Admission Type (Psych Patients Only)  Admission Status Involuntary  Psychosocial Assessment  Patient Complaints Anxiety  Eye Contact Brief  Facial Expression Anxious  Affect Anxious  Speech Logical/coherent  Interaction Assertive  Motor Activity Fidgety  Appearance/Hygiene Unremarkable  Behavior Characteristics Cooperative;Appropriate to situation;Anxious  Mood Anxious;Pleasant  Thought Process  Coherency Concrete thinking  Content WDL  Delusions None reported or observed  Perception WDL  Hallucination None reported or observed  Judgment Poor  Confusion None  Danger to Self  Current suicidal ideation? Denies  Danger to Others  Danger to Others None reported or observed   Pt seen in the hallway. Pt denies SI, HI, AVH. Rates pain 4/10 as tooth pain in right lower jaw. Pt refused any pain medication. Pt rates anxiety 1/10 and denies depression.

## 2021-10-10 NOTE — Progress Notes (Signed)
  [facility] Adult Case Management Discharge Plan :  Will you be returning to the same living situation after discharge:  Yes,  Home At discharge, do you have transportation home?: Yes,  Safe Transport  Do you have the ability to pay for your medications: Yes,  Family   Release of information consent forms completed and in the chart;  Patient's signature needed at discharge.  Patient to Follow up at:  Follow-up Information     [facility] Follow up.   Specialty: Behavioral Health Why: You may go to this provider for outpatient therapy and/or medication management services during walk in hours:  Monday through Wednesday, from 7:45 am to 11:00 am.  Services are provided on a first come, first served basis.  Please arrive early. Contact information: [address] [place] [address] [phone]                Next level of care provider has access to [facility]:yes  Safety Planning and Suicide Prevention discussed: Yes,  with patient      Has patient been referred to the Quitline?: N/A patient is not a smoker  Patient has been referred for addiction treatment: N/A  [name], LCSWA 10/10/2021, 9:48 AM

## 2021-10-10 NOTE — Plan of Care (Signed)
Patient has met educational goals for discharge.

## 2021-10-10 NOTE — Progress Notes (Signed)
Patient ID: Erika Rhodes, female   DOB: 26-Oct-1981, 40 y.o.   MRN: [id number] Patient discharged to home/self care in the presence of her domestic partner.  Patient denies SI, HI and AVH upon discharge.  Patient has been in good control of self prior to discharge and has been noted to be eager to discharge.  Patient acknowledged understanding of all discharge instructions and receipt of personal belongings.  Patient expressed the desire to continue treatment within the community upon discharge.

## 2021-10-10 NOTE — Discharge Instructions (Addendum)
Dear Erika Rhodes, I am so glad you are feeling better and can be discharged today (10/10/2021)! You were admitted for management of bipolar disorder.   Please see the following instructions: Please attend the scheduled appointment or make an appointment to follow-up with your psychiatrist, therapist, and the other specialists seen below. Please see your primary care / family doctor for your other medical issues, concerns and or health care needs. NEW meds:  Prolixin 5 mg by mouth three times daily for mood stabilization, Metformin 500 mg by mouth twice daily for type 2 diabetes, Hydroxyzine 25 mg by mouth up to three times daily as needed for anxiety/sleep, and trazodone 50 mg by mouth at bedtime as needed for sleep.  Report any adverse effects and or reactions from the medicines to your outpatient provider promptly. Do not engage in alcohol and or illegal drug use while on prescription medicines. In the event of worsening symptoms, text or call the National Suicide Prevention Crisis Hotline at 6, call 911, visit the [facility] located at [address] and/or go to the nearest ED for appropriate evaluation and treatment of symptoms.  It was a pleasure meeting you, Erika Rhodes.  I wish you and your family the best, and hope you stay happy and healthy!  [name], MD 10/10/2021
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
## 2021-10-17 ENCOUNTER — Emergency Department (HOSPITAL_BASED_OUTPATIENT_CLINIC_OR_DEPARTMENT_OTHER)
Admission: EM | Admit: 2021-10-17 | Discharge: 2021-10-18 | Disposition: A | Discharge status: Home / Self Care | Payer: Medicaid Other | Attending: Emergency Medicine | Admitting: Emergency Medicine

## 2021-10-17 ENCOUNTER — Encounter (HOSPITAL_BASED_OUTPATIENT_CLINIC_OR_DEPARTMENT_OTHER): Payer: Self-pay | Admitting: Emergency Medicine

## 2021-10-17 ENCOUNTER — Emergency Department (HOSPITAL_BASED_OUTPATIENT_CLINIC_OR_DEPARTMENT_OTHER): Payer: Medicaid Other | Admitting: Radiology

## 2021-10-17 ENCOUNTER — Other Ambulatory Visit: Payer: Self-pay

## 2021-10-17 DIAGNOSIS — W458XXA Other foreign body or object entering through skin, initial encounter: Secondary | ICD-10-CM | POA: Insufficient documentation

## 2021-10-17 DIAGNOSIS — S90852A Superficial foreign body, left foot, initial encounter: Secondary | ICD-10-CM | POA: Insufficient documentation

## 2021-10-17 DIAGNOSIS — Z5321 Procedure and treatment not carried out due to patient leaving prior to being seen by health care provider: Secondary | ICD-10-CM | POA: Insufficient documentation

## 2021-10-17 DIAGNOSIS — K0889 Other specified disorders of teeth and supporting structures: Secondary | ICD-10-CM | POA: Insufficient documentation

## 2021-10-17 DIAGNOSIS — G8918 Other acute postprocedural pain: Secondary | ICD-10-CM | POA: Insufficient documentation

## 2021-10-17 DIAGNOSIS — N898 Other specified noninflammatory disorders of vagina: Secondary | ICD-10-CM | POA: Insufficient documentation

## 2021-10-17 NOTE — ED Triage Notes (Signed)
Pt arrives pov with c/o glass in left foot, inability to remove. Pt also reports vaginal cyst and dental pain after tooth extraction yesterday

## 2021-10-18 ENCOUNTER — Other Ambulatory Visit: Payer: Self-pay

## 2021-10-18 ENCOUNTER — Encounter (HOSPITAL_BASED_OUTPATIENT_CLINIC_OR_DEPARTMENT_OTHER): Payer: Self-pay

## 2021-10-18 ENCOUNTER — Emergency Department (HOSPITAL_BASED_OUTPATIENT_CLINIC_OR_DEPARTMENT_OTHER): Payer: Medicaid Other

## 2021-10-18 ENCOUNTER — Emergency Department (HOSPITAL_BASED_OUTPATIENT_CLINIC_OR_DEPARTMENT_OTHER)
Admission: EM | Admit: 2021-10-18 | Discharge: 2021-10-18 | Disposition: A | Discharge status: Home / Self Care | Payer: Medicaid Other | Attending: Emergency Medicine | Admitting: Emergency Medicine

## 2021-10-18 DIAGNOSIS — N898 Other specified noninflammatory disorders of vagina: Secondary | ICD-10-CM | POA: Insufficient documentation

## 2021-10-18 DIAGNOSIS — E039 Hypothyroidism, unspecified: Secondary | ICD-10-CM | POA: Insufficient documentation

## 2021-10-18 DIAGNOSIS — Z79899 Other long term (current) drug therapy: Secondary | ICD-10-CM | POA: Insufficient documentation

## 2021-10-18 DIAGNOSIS — J45909 Unspecified asthma, uncomplicated: Secondary | ICD-10-CM | POA: Insufficient documentation

## 2021-10-18 DIAGNOSIS — S90852A Superficial foreign body, left foot, initial encounter: Secondary | ICD-10-CM | POA: Insufficient documentation

## 2021-10-18 DIAGNOSIS — E119 Type 2 diabetes mellitus without complications: Secondary | ICD-10-CM | POA: Insufficient documentation

## 2021-10-18 DIAGNOSIS — K0889 Other specified disorders of teeth and supporting structures: Secondary | ICD-10-CM | POA: Insufficient documentation

## 2021-10-18 DIAGNOSIS — X58XXXA Exposure to other specified factors, initial encounter: Secondary | ICD-10-CM | POA: Insufficient documentation

## 2021-10-18 LAB — BASIC METABOLIC PANEL
Anion gap: 10 (ref 5–15)
BUN: 12 mg/dL (ref 6–20)
CO2: 24 mmol/L (ref 22–32)
Calcium: 9.1 mg/dL (ref 8.9–10.3)
Chloride: 100 mmol/L (ref 98–111)
Creatinine, Ser: 0.74 mg/dL (ref 0.44–1.00)
GFR, Estimated: >60 mL/min (ref >60)
Glucose, Bld: 221 mg/dL — ABNORMAL HIGH (ref 70–99)
Potassium: 3.9 mmol/L (ref 3.5–5.1)
Sodium: 134 mmol/L — ABNORMAL LOW (ref 135–145)

## 2021-10-18 LAB — CBC WITH DIFFERENTIAL/PLATELET
Abs Immature Granulocytes: 0.01 10*3/uL (ref 0.00–0.07)
Basophils Absolute: 0 10*3/uL (ref 0.0–0.1)
Basophils Relative: 0 %
Eosinophils Absolute: 0.1 10*3/uL (ref 0.0–0.5)
Eosinophils Relative: 2 %
HCT: 40.2 % (ref 36.0–46.0)
Hemoglobin: 14.4 g/dL (ref 12.0–15.0)
Immature Granulocytes: 0 %
Lymphocytes Relative: 26 %
Lymphs Abs: 1.6 10*3/uL (ref 0.7–4.0)
MCH: 31.6 pg (ref 26.0–34.0)
MCHC: 35.8 g/dL (ref 30.0–36.0)
MCV: 88.4 fL (ref 80.0–100.0)
Monocytes Absolute: 0.4 10*3/uL (ref 0.1–1.0)
Monocytes Relative: 7 %
Neutro Abs: 4 10*3/uL (ref 1.7–7.7)
Neutrophils Relative %: 65 %
Platelets: 265 10*3/uL (ref 150–400)
RBC: 4.55 MIL/uL (ref 3.87–5.11)
RDW: 12.3 % (ref 11.5–15.5)
WBC: 6.2 10*3/uL (ref 4.0–10.5)
nRBC: 0 % (ref 0.0–0.2)

## 2021-10-18 LAB — HCG, SERUM, QUALITATIVE: Preg, Serum: NEGATIVE

## 2021-10-18 MED ORDER — NYSTATIN 100000 UNIT/GM EX CREA: 1.0000 "application " | TOPICAL_CREAM | Freq: Two times a day (BID) | CUTANEOUS | 0 refills | Status: AC

## 2021-10-18 MED ORDER — ONDANSETRON HCL 4 MG PO TABS: 4.0000 mg | ORAL_TABLET | Freq: Four times a day (QID) | ORAL | 0 refills | Status: DC

## 2021-10-18 MED ORDER — NYSTATIN 100000 UNIT/ML MT SUSP: 500000.0000 [IU] | Freq: Four times a day (QID) | OROMUCOSAL | 0 refills | Status: DC

## 2021-10-18 MED ORDER — MORPHINE SULFATE (PF) 4 MG/ML IV SOLN
4.0000 mg | Freq: Once | INTRAVENOUS | Status: AC
  Administered 2021-10-18: 4 mg via INTRAVENOUS
  Filled 2021-10-18: qty 1

## 2021-10-18 MED ORDER — CLINDAMYCIN PHOSPHATE 600 MG/50ML IV SOLN
600.0000 mg | Freq: Once | INTRAVENOUS | Status: AC
  Administered 2021-10-18: 600 mg via INTRAVENOUS
  Filled 2021-10-18: qty 50

## 2021-10-18 MED ORDER — LIDOCAINE HCL 2 % IJ SOLN
15.0000 mL | Freq: Once | INTRAMUSCULAR | Status: AC
  Administered 2021-10-18: 300 mg via INTRADERMAL
  Filled 2021-10-18: qty 20

## 2021-10-18 MED ORDER — IOHEXOL 300 MG/ML  SOLN
75.0000 mL | Freq: Once | INTRAMUSCULAR | Status: AC | PRN
  Administered 2021-10-18: 75 mL via INTRAVENOUS

## 2021-10-18 MED ORDER — LIDOCAINE-EPINEPHRINE-TETRACAINE (LET) TOPICAL GEL
3.0000 mL | Freq: Once | TOPICAL | Status: AC
  Administered 2021-10-18: 3 mL via TOPICAL
  Filled 2021-10-18: qty 3

## 2021-10-18 MED ORDER — AMOXICILLIN 500 MG PO CAPS: 500.0000 mg | ORAL_CAPSULE | Freq: Three times a day (TID) | ORAL | 0 refills | Status: DC

## 2021-10-18 MED ORDER — SODIUM CHLORIDE 0.9 % IV SOLN: INTRAVENOUS | Status: DC | PRN

## 2021-10-18 MED ORDER — NAPROXEN 500 MG PO TABS: 500.0000 mg | ORAL_TABLET | Freq: Two times a day (BID) | ORAL | 0 refills | Status: AC

## 2021-10-18 NOTE — ED Triage Notes (Signed)
Pt  has dental extraction 2 days ago, office stated forgot to start on amoxicillin, sent to ER in case she needs IV antibiotics.  Also, pt stepped on glass last night, FB remains imbedded.  Bleeding stopped.  Took tylenol 30 min ago, alleve 0400.

## 2021-10-18 NOTE — ED Notes (Signed)
Pt states had xray left foot last night at urgent care, left before treatment complete

## 2021-10-18 NOTE — ED Provider Notes (Signed)
[facility] EMERGENCY DEPARTMENT Provider Note   CSN: [id number] Arrival date & time: 10/18/21  [id number]     History Chief Complaint  Patient presents with   Dental Pain   Foot Pain    Erika Rhodes is a 40 y.o. female.  HPI  40 year old female with a history of allergic reaction, anxiety, asthma, depression, diabetes, migraines, rheumatoid arthritis, bipolar disorder, who presents to the emergency department today for multiple complaints.  Dental pain: Patient states that she got a tooth extracted recently and did not realize that she was supposed to have started on antibiotics.  Since then she has had increasing pain to the right lower mouth that is now also involving the jaw.  She has not had any fevers at home.  The patient is constant and severe in nature.  She called her dentist who advised her to come to the ED  Foot pain: Patient planing of left foot pain after stepping on glass.  States she can feel the glass present and has tried to extract it without success.  She has pain with any weightbearing.  She had an x-ray last night after going to droppage but left prior to being seen.  Vaginal complaint: Patient is also complaining of some swelling to her labia.  She is concerned she has a Bartholin cyst.  She states that it is quite small but painful.  The pain is constant and nature.  Past Medical History:  Diagnosis Date   Allergic reaction    Anxiety    Asthma    Depression    DM (diabetes mellitus), gestational    Migraines    Rheumatoid arthritis ([place]) 2016   Diagnosed Dr.  Lamarr Lulas; has been treated with MTX, Humira, etc, but made her sick.  Was taking CBD oil and stopped as tested positive in a drug screen for work.    Patient Active Problem List   Diagnosis Date Noted   Bipolar I disorder ([place]) 10/03/2021   GERD (gastroesophageal reflux disease) 09/03/2021   Intentional overdose ([place]) 09/01/2021   Hypokalemia 09/01/2021   Type 2 diabetes mellitus  with hyperglycemia ([place]) 09/01/2021   Prolonged QT interval 09/01/2021   MDD (major depressive disorder), recurrent, severe, with psychosis ([place]) 05/31/2021   Psychosis, affective ([place]) 05/30/2021   MDD (major depressive disorder), recurrent episode, severe ([place]) 01/12/2021   PTSD (post-traumatic stress disorder) 01/12/2021   Attention deficit hyperactivity disorder, 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/2021   Bilateral carpal tunnel syndrome 05/31/2020   Numbness and tingling of both feet 05/31/2020   Anxiety 04/29/2020   Agoraphobia 04/29/2020   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 ([place]) 10/29/2018   Cannabis use disorder, moderate, dependence ([place]) 03/04/2018   Benzodiazepine-induced psychosis, 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[place]) [id number]   Diabetes ([place]) 02/27/2018   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   Paresthesia of right upper and lower extremity 10/12/2017   Long term current use of opiate analgesic 08/12/2015   Headache 12/06/2014   Rheumatoid arthritis ([place]) 2016   Gestational diabetes mellitus in pregnancy, unspecified control 09/01/2014   Endocrine, nutritional and metabolic diseases complicating pregnancy, unspecified trimester 05/24/2014   History of cesarean section 05/24/2014   Asthma 08/10/2013   Epigastric pain 08/10/2013   Hypothyroidism 08/10/2013   Migraine with aura 08/10/2013   Irritable colon 08/10/2013    Past Surgical History:  Procedure Laterality Date   ABDOMINAL HYSTERECTOMY  2017   Fibroids; Both ovaries intact still   BACK SURGERY     CESAREAN SECTION  2009  CHOLECYSTECTOMY  2010   laparoscopic   TONSILLECTOMY       OB History   No obstetric history on file.     Family History  Problem Relation Age of Onset   Arthritis Mother        Rheumatoid   Depression Daughter        and anxiety   Allergies Son     Social History   Tobacco Use   Smoking status: Never   Smokeless tobacco: Never  Vaping Use   Vaping Use: Some days   Substance Use Topics   Alcohol use: Yes    Alcohol/week: 2.0 standard drinks    Types: 2 Glasses of wine per week    Comment: 2 drinks 1-2xmonth   Drug use: Yes    Comment: delta 10    Home Medications Prior to Admission medications   Medication Sig Start Date End Date Taking? Authorizing Provider  nystatin (MYCOSTATIN) 100000 UNIT/ML suspension Take 5 mLs (500,000 Units total) by mouth 4 (four) times daily. 10/18/21  Yes Kyzen Horn S, PA-C  albuterol (VENTOLIN HFA) 108 (90 Base) MCG/ACT inhaler Inhale 1-2 puffs into the lungs every 4 (four) hours as needed for wheezing or shortness of breath. 01/15/21   Laveda Abbe, NP  fluPHENAZine (PROLIXIN) 5 MG tablet Take 1 tablet (5 mg total) by mouth 3 (three) times daily. 10/10/21   [name], MD  hydrOXYzine (ATARAX) 25 MG tablet Take 1 tablet (25 mg total) by mouth 3 (three) times daily as needed for anxiety. 10/10/21   [name], MD  melatonin 3 MG TABS tablet Take 2 tablets (6 mg total) by mouth at bedtime. 09/09/21   Oneta Rack, NP  metFORMIN (GLUCOPHAGE) 500 MG tablet Take 1 tablet (500 mg total) by mouth 2 (two) times daily with a meal. 10/10/21   [name], MD  oxymetazoline (NASAL SPRAY 12 HOUR) 0.05 % nasal spray Place 1 spray into both nostrils 2 (two) times daily as needed for congestion.    [provider]  traZODone (DESYREL) 50 MG tablet Take 1 tablet (50 mg total) by mouth at bedtime as needed for sleep. 10/10/21   [name], MD    Allergies    Azithromycin  Review of Systems   Review of Systems  Constitutional:  Negative for fever.  HENT:  Positive for dental problem.   Eyes:  Negative for visual disturbance.  Respiratory:  Negative for shortness of breath.   Cardiovascular:  Negative for chest pain.  Gastrointestinal:  Negative for abdominal pain.  Genitourinary:  Positive for genital sores and vaginal pain. Negative for pelvic pain, vaginal bleeding and vaginal discharge.   Musculoskeletal:        Foot pain  Skin:  Positive for wound.   Physical Exam Updated Vital Signs BP 132/77   Pulse 71   Temp 97.9 F (36.6 C) (Oral)   Resp 18   Ht 5\' 3"  (1.6 m)   Wt 92.1 kg   SpO2 100%   BMI 35.96 kg/m   Physical Exam Vitals and nursing note reviewed.  Constitutional:      General: She is not in acute distress.    Appearance: She is well-developed.  HENT:     Head: Normocephalic and atraumatic.     Mouth/Throat:     Comments: TTP to the right lower molar. Minimal ttp over the right mandible and cervical lymph node chains. No significant facial/neck swelling. No sublingual or submandibular swelling. No  trismus. Eyes:  Conjunctiva/sclera: Conjunctivae normal.  Cardiovascular:     Rate and Rhythm: Normal rate.  Pulmonary:     Effort: Pulmonary effort is normal.  Genitourinary:    Comments: Superficial punctate ulceration noted to the right lower labia minora. No cyst/abscess noted Musculoskeletal:        General: Normal range of motion.     Cervical back: Neck supple.     Comments: TTP to the plantar surface of the left foot.   Skin:    General: Skin is warm and dry.  Neurological:     Mental Status: She is alert.   ED Results / Procedures / Treatments   Labs (all labs ordered are listed, but only abnormal results are displayed) Labs Reviewed  BASIC METABOLIC PANEL - Abnormal; Notable for the following components:      Result Value   Sodium 134 (*)    Glucose, Bld 221 (*)    All other components within normal limits  HSV CULTURE AND TYPING  CBC WITH DIFFERENTIAL/PLATELET  HCG, SERUM, QUALITATIVE  RPR    EKG None  Radiology CT Soft Tissue Neck W Contrast  Result Date: 10/18/2021 CLINICAL DATA:  Soft tissue swelling neck.  Rule out infection. EXAM: CT NECK WITH CONTRAST TECHNIQUE: Multidetector CT imaging of the neck was performed using the standard protocol following the bolus administration of intravenous contrast. CONTRAST:  55mL  OMNIPAQUE IOHEXOL 300 MG/ML  SOLN COMPARISON:  None. FINDINGS: Pharynx and larynx: Normal. No mass or swelling. Salivary glands: No inflammation, mass, or stone. Small periparotid lymph nodes bilaterally. Thyroid: Negative Lymph nodes: Scattered nonpathologic lymph nodes bilaterally. Right level 2 lymph node 9 mm. Right level 2 lymph node 6 mm. Right level 3 lymph node 9 mm Left level 2 lymph node 9 mm.  Left level 3 lymph node 9 mm. Vascular: Normal vascular enhancement. Limited intracranial: Negative Visualized orbits: Negative Mastoids and visualized paranasal sinuses: Paranasal sinuses clear. Mastoid clear bilaterally Skeleton: Recent extraction right lower molar. Periapical lucency around right lower pre molar. Caries left upper cuspid and left upper molar. No soft tissue abscess around the mandible or maxilla. Upper chest: Lung apices clear bilaterally. Other: None IMPRESSION: Multiple caries.  No soft tissue abscess. Reactive nodes in the neck bilaterally. Electronically Signed   By: Franchot Gallo M.D.   On: 10/18/2021 12:10   DG Foot Complete Left  Result Date: 10/17/2021 CLINICAL DATA:  Foreign body, glass in left foot EXAM: LEFT FOOT - COMPLETE 3+ VIEW COMPARISON:  None. FINDINGS: No fracture or dislocation is seen. The joint spaces are preserved. On the lateral view, there is a 5 mm subcutaneous foreign body along the plantar surface of the proximal phalanges. This cannot be correlated on the frontal radiograph to assess the precise location. This does not have the radiodense appearance of glass. IMPRESSION: 5 mm subcutaneous foreign body along the plantar surface of the forefoot, as described above, although without the radiodense appearance of glass. No fracture or dislocation. Electronically Signed   By: Julian Hy M.D.   On: 10/17/2021 23:10    Procedures .Foreign Body Removal  Date/Time: 10/18/2021 1:05 PM Performed by: Rodney Booze, PA-C Authorized by: Rodney Booze, PA-C   Consent: Verbal consent obtained. Risks and benefits: risks, benefits and alternatives were discussed Consent given by: patient Patient understanding: patient states understanding of the procedure being performed Patient identity confirmed: verbally with patient Time out: Immediately prior to procedure a "time out" was called to verify the correct patient, procedure, equipment,  support staff and site/side marked as required. Body area: skin General location: lower extremity Location details: left foot Anesthesia: local infiltration  Anesthesia: Local Anesthetic: lidocaine 2% without epinephrine Anesthetic total: 2 mL  Sedation: Patient sedated: no  Patient restrained: no Depth: subcutaneous Complexity: simple 1 objects recovered. Objects recovered: piece of glass Post-procedure assessment: foreign body removed Patient tolerance: patient tolerated the procedure well with no immediate complications    Medications Ordered in ED Medications  0.9 %  sodium chloride infusion ( Intravenous New Bag/Given 10/18/21 1113)  lidocaine-EPINEPHrine-tetracaine (LET) topical gel (3 mLs Topical Given 10/18/21 1108)  lidocaine (XYLOCAINE) 2 % (with pres) injection 300 mg (300 mg Intradermal Given by Other 10/18/21 1110)  clindamycin (CLEOCIN) IVPB 600 mg (600 mg Intravenous New Bag/Given 10/18/21 1113)  morphine 4 MG/ML injection 4 mg (4 mg Intravenous Given 10/18/21 1126)  iohexol (OMNIPAQUE) 300 MG/ML solution 75 mL (75 mLs Intravenous Contrast Given 10/18/21 1135)    ED Course  I have reviewed the triage vital signs and the nursing notes.  Pertinent labs & imaging results that were available during my care of the patient were reviewed by me and considered in my medical decision making (see chart for details).    MDM Rules/Calculators/A&P                          40 y/o female here with mult complaints   Dental pain: labs reassuring. CT soft tissue neck w/o deep space infection. IV  clinda and pain meds. No evidence of ludwigs. Dc with abx.   Vaginal complaint: pt with small ulceration to the right lower vulva. No cyst/abscess present. Looks most likely consistent with a small abrasion. Does have frequent yeast infections from being on doxy. Will give nystatin cream.  Swabbed for rsv and rpr sent.   FB in foot: small piece of glass removed from the foot. Pt tolerated well. She is on chronic doxy for acne. Advised her to continue. No evidence of infection at this time  Advised on f/u and strict return precautions. All questions answered, pt stable for discharge   Final Clinical Impression(s) / ED Diagnoses Final diagnoses:  Pain, dental  Vaginal irritation  Foreign body in left foot, initial encounter    Rx / DC Orders ED Discharge Orders          Ordered    nystatin (MYCOSTATIN) 100000 UNIT/ML suspension  4 times daily        10/18/21 [address] Graycen Sadlon S, PA-C 10/18/21 1306    Gareth Morgan, MD 10/20/21 0004

## 2021-10-18 NOTE — Discharge Instructions (Addendum)
Take the amoxicillin you were given by your dentist.   For your foot, keep the area clean and dry.  For your vaginal discomfort I have prescribed nystatin cream. You had a swab of the area and a lab draw to test for an infection that could have caused the wound. These results will be available on your mychart in the next 3-5 days and if they are abnormal the hospital will call you and let you know   Please follow up with your primary care provider within 5-7 days for re-evaluation of your symptoms. If you do not have a primary care provider, information for a healthcare clinic has been provided for you to make arrangements for follow up care. Please return to the emergency department for any new or worsening symptoms.

## 2021-10-18 NOTE — ED Notes (Signed)
Pt transported to CT ?

## 2021-10-19 LAB — RPR: RPR Ser Ql: NONREACTIVE

## 2021-10-21 LAB — HSV CULTURE AND TYPING

## 2021-12-10 ENCOUNTER — Emergency Department (HOSPITAL_BASED_OUTPATIENT_CLINIC_OR_DEPARTMENT_OTHER)
Admission: EM | Admit: 2021-12-10 | Discharge: 2021-12-10 | Disposition: A | Discharge status: Home / Self Care | Payer: Medicaid Other | Attending: Emergency Medicine | Admitting: Emergency Medicine

## 2021-12-10 ENCOUNTER — Other Ambulatory Visit: Payer: Self-pay

## 2021-12-10 ENCOUNTER — Encounter (HOSPITAL_BASED_OUTPATIENT_CLINIC_OR_DEPARTMENT_OTHER): Payer: Self-pay | Admitting: *Deleted

## 2021-12-10 DIAGNOSIS — E119 Type 2 diabetes mellitus without complications: Secondary | ICD-10-CM | POA: Insufficient documentation

## 2021-12-10 DIAGNOSIS — R21 Rash and other nonspecific skin eruption: Secondary | ICD-10-CM

## 2021-12-10 DIAGNOSIS — Z79899 Other long term (current) drug therapy: Secondary | ICD-10-CM | POA: Insufficient documentation

## 2021-12-10 DIAGNOSIS — Z7984 Long term (current) use of oral hypoglycemic drugs: Secondary | ICD-10-CM | POA: Insufficient documentation

## 2021-12-10 MED ORDER — CEPHALEXIN 500 MG PO CAPS: 500.0000 mg | ORAL_CAPSULE | Freq: Four times a day (QID) | ORAL | 0 refills | Status: DC

## 2021-12-10 MED ORDER — CEPHALEXIN 250 MG PO CAPS
500.0000 mg | ORAL_CAPSULE | Freq: Once | ORAL | Status: DC
  Filled 2021-12-10: qty 2

## 2021-12-10 MED ORDER — CEPHALEXIN 250 MG PO CAPS
250.0000 mg | ORAL_CAPSULE | Freq: Once | ORAL | Status: AC
  Administered 2021-12-10: 250 mg via ORAL
  Filled 2021-12-10: qty 1

## 2021-12-10 MED ORDER — PREDNISONE 20 MG PO TABS
40.0000 mg | ORAL_TABLET | Freq: Once | ORAL | Status: DC
  Filled 2021-12-10: qty 2

## 2021-12-10 MED ORDER — MUPIROCIN CALCIUM 2 % EX CREA: 1.0000 "application " | TOPICAL_CREAM | Freq: Two times a day (BID) | CUTANEOUS | 0 refills | Status: DC

## 2021-12-10 MED ORDER — EPINEPHRINE 0.3 MG/0.3ML IJ SOAJ: 0.3000 mg | INTRAMUSCULAR | 0 refills | Status: DC | PRN

## 2021-12-10 MED ORDER — PREDNISONE 50 MG PO TABS
60.0000 mg | ORAL_TABLET | Freq: Once | ORAL | Status: AC
  Administered 2021-12-10: 60 mg via ORAL
  Filled 2021-12-10: qty 1

## 2021-12-10 MED ORDER — PREDNISONE 20 MG PO TABS: 40.0000 mg | ORAL_TABLET | Freq: Every day | ORAL | 0 refills | Status: DC

## 2021-12-10 NOTE — ED Notes (Signed)
Pt A&OX4 ambulatory at d/c with independent steady gait, NAD. Pt verbalized understanding of d/c instructions, prescriptions and follow up care. 

## 2021-12-10 NOTE — ED Provider Notes (Signed)
[facility] EMERGENCY DEPARTMENT Provider Note   CSN: [id number] Arrival date & time: 12/10/21  1847     History  Chief Complaint  Patient presents with   Rash    Erika Rhodes is a 41 y.o. female.  Patient is a 41 year old female with a history of migraines, anxiety, depression, RA, diabetes, who is on chronic doxycycline for acne but presents today with a 2-day history of worsening rash on her face a little bit on her upper chest and back of her neck.  She reports it feels like her whole body is tingling and the areas are very tender.  She complains of having some nasal congestion and feeling mildly short of breath as well over the last few days.  The lesions have broken open and are little bit crusted.  She has not been on any new medications recently.  She has been on the doxycycline for months but there is been no other antibiotics.  She has not had a fever.  The history is provided by the patient and medical records.  Rash     Home Medications Prior to Admission medications   Medication Sig Start Date End Date Taking? Authorizing Provider  albuterol (VENTOLIN HFA) 108 (90 Base) MCG/ACT inhaler Inhale 1-2 puffs into the lungs every 4 (four) hours as needed for wheezing or shortness of breath. 01/15/21   Ethelene Hal, NP  amoxicillin (AMOXIL) 500 MG capsule Take 1 capsule (500 mg total) by mouth 3 (three) times daily. 10/18/21   Couture, Cortni S, PA-C  fluPHENAZine (PROLIXIN) 5 MG tablet Take 1 tablet (5 mg total) by mouth 3 (three) times daily. 10/10/21   Rosezetta Schlatter, MD  hydrOXYzine (ATARAX) 25 MG tablet Take 1 tablet (25 mg total) by mouth 3 (three) times daily as needed for anxiety. 10/10/21   Rosezetta Schlatter, MD  melatonin 3 MG TABS tablet Take 2 tablets (6 mg total) by mouth at bedtime. 09/09/21   Derrill Center, NP  metFORMIN (GLUCOPHAGE) 500 MG tablet Take 1 tablet (500 mg total) by mouth 2 (two) times daily with a meal. 10/10/21   Rosezetta Schlatter, MD   ondansetron (ZOFRAN) 4 MG tablet Take 1 tablet (4 mg total) by mouth every 6 (six) hours. 10/18/21   Couture, Cortni S, PA-C  oxymetazoline (NASAL SPRAY 12 HOUR) 0.05 % nasal spray Place 1 spray into both nostrils 2 (two) times daily as needed for congestion.    [provider]  traZODone (DESYREL) 50 MG tablet Take 1 tablet (50 mg total) by mouth at bedtime as needed for sleep. 10/10/21   Rosezetta Schlatter, MD      Allergies    Azithromycin    Review of Systems   Review of Systems  Skin:  Positive for rash.   Physical Exam Updated Vital Signs BP (!) 148/93 (BP Location: Left Arm)    Pulse (!) 113    Temp 98.4 F (36.9 C) (Oral)    Resp 18    Ht 5\' 3"  (1.6 m)    Wt 90.7 kg    SpO2 99%    BMI 35.43 kg/m  Physical Exam Vitals and nursing note reviewed.  Constitutional:      General: She is not in acute distress.    Appearance: She is well-developed.     Comments: Mildly anxious on exam  HENT:     Head: Normocephalic and atraumatic.     Right Ear: Tympanic membrane normal.     Left Ear: Tympanic membrane  normal.     Nose: Congestion present.     Mouth/Throat:     Comments: 1 small ulcerative lesion present by the left upper tooth and the gum.  Pharynx is clear without erythema, lesions or swelling Eyes:     Pupils: Pupils are equal, round, and reactive to light.  Neck:     Comments: No stridor Cardiovascular:     Rate and Rhythm: Regular rhythm. Tachycardia present.     Heart sounds: Normal heart sounds. No murmur heard.   No friction rub.  Pulmonary:     Effort: Pulmonary effort is normal.     Breath sounds: Normal breath sounds. No wheezing or rales.  Abdominal:     General: Bowel sounds are normal. There is no distension.     Palpations: Abdomen is soft.     Tenderness: There is no abdominal tenderness. There is no guarding or rebound.  Musculoskeletal:        General: No tenderness. Normal range of motion.     Cervical back: Normal range of motion and neck  supple.     Right lower leg: No edema.     Left lower leg: No edema.     Comments: No edema  Skin:    General: Skin is warm and dry.     Findings: Rash present.     Comments: Multiple patchy lesions mostly focused on the face but also on the back of the neck and upper chest with some yellow crust as well as some scabbing.  Tender to the touch.  Mild surrounding erythema  Neurological:     Mental Status: She is alert and oriented to person, place, and time. Mental status is at baseline.     Cranial Nerves: No cranial nerve deficit.  Psychiatric:        Behavior: Behavior normal.    ED Results / Procedures / Treatments   Labs (all labs ordered are listed, but only abnormal results are displayed) Labs Reviewed - No data to display  EKG None  Radiology No results found.  Procedures Procedures    Medications Ordered in ED Medications  cephALEXin (KEFLEX) capsule 250 mg (250 mg Oral Given 12/10/21 1942)  predniSONE (DELTASONE) tablet 60 mg (60 mg Oral Given 12/10/21 1941)    ED Course/ Medical Decision Making/ A&P                           Medical Decision Making Risk Prescription drug management.   Patient is presenting today for a painful rash.  She denies any drug or alcohol use except for cannabis.  She does take doxycycline regularly for acne but rash today appears to have cellulitic components but she also complains of some allergic type symptoms.  She does not have any evidence of erythema multiforme or Stevens-Johnson's.  Concern for possible strep infection but also could be allergic.  Symptoms are not classic for shingles.  Patient will be given Keflex and prednisone.  She was also given an EpiPen as she has had allergic reactions in the past and mupirocin.  At this time there is no evidence of airway compromise.  She has no stridor, swelling in her throat or wheezing.  Sats are 99% on room air.  Patient was recently seen at an outside hospital and external medical records  were evaluated.  At that time she was negative for syphilis and HSV.        Final Clinical Impression(s) / ED  Diagnoses Final diagnoses:  Rash    Rx / DC Orders ED Discharge Orders          Ordered    EPINEPHrine 0.3 mg/0.3 mL IJ SOAJ injection  As needed        12/10/21 1949    predniSONE (DELTASONE) 20 MG tablet  Daily        12/10/21 1949    mupirocin cream (BACTROBAN) 2 %  2 times daily        12/10/21 1949    cephALEXin (KEFLEX) 500 MG capsule  4 times daily        12/10/21 1949              Blanchie Dessert, MD 12/10/21 1950

## 2021-12-10 NOTE — Discharge Instructions (Signed)
If you start feeling like your throat is closing and you cannot get air at all you should use the EpiPen and then come to the hospital.  Start using the mupirocin cream on your face and start the new antibiotic.

## 2021-12-10 NOTE — ED Notes (Addendum)
Registration instructed/ requested  pt to wear a mask , pt refused , triage nurse requested pt to wear a mask especially in the presence of any healthcare provider durig her care  , pt refused. States " im in resp distress". 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 Triage completed and pt took a seat in front of registration w/o a mask.

## 2021-12-10 NOTE — ED Triage Notes (Signed)
C/o rash to left side of face and mouth x 1 day

## 2021-12-17 ENCOUNTER — Encounter (HOSPITAL_BASED_OUTPATIENT_CLINIC_OR_DEPARTMENT_OTHER): Payer: Self-pay

## 2021-12-17 ENCOUNTER — Emergency Department (HOSPITAL_BASED_OUTPATIENT_CLINIC_OR_DEPARTMENT_OTHER)
Admission: EM | Admit: 2021-12-17 | Discharge: 2021-12-17 | Discharge status: Against Medical Advice | Payer: Medicaid Other | Attending: Emergency Medicine | Admitting: Emergency Medicine

## 2021-12-17 ENCOUNTER — Other Ambulatory Visit: Payer: Self-pay

## 2021-12-17 DIAGNOSIS — Z5321 Procedure and treatment not carried out due to patient leaving prior to being seen by health care provider: Secondary | ICD-10-CM | POA: Insufficient documentation

## 2021-12-17 DIAGNOSIS — R202 Paresthesia of skin: Secondary | ICD-10-CM | POA: Insufficient documentation
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
## 2021-12-23 DIAGNOSIS — E876 Hypokalemia: Secondary | ICD-10-CM | POA: Insufficient documentation

## 2021-12-23 DIAGNOSIS — Z7984 Long term (current) use of oral hypoglycemic drugs: Secondary | ICD-10-CM | POA: Insufficient documentation

## 2021-12-23 DIAGNOSIS — F439 Reaction to severe stress, unspecified: Secondary | ICD-10-CM | POA: Insufficient documentation

## 2021-12-23 DIAGNOSIS — R45851 Suicidal ideations: Secondary | ICD-10-CM | POA: Insufficient documentation

## 2021-12-23 DIAGNOSIS — Z20822 Contact with and (suspected) exposure to covid-19: Secondary | ICD-10-CM | POA: Insufficient documentation

## 2021-12-23 DIAGNOSIS — F609 Personality disorder, unspecified: Secondary | ICD-10-CM | POA: Insufficient documentation

## 2021-12-23 DIAGNOSIS — F419 Anxiety disorder, unspecified: Secondary | ICD-10-CM | POA: Insufficient documentation

## 2021-12-23 DIAGNOSIS — N9489 Other specified conditions associated with female genital organs and menstrual cycle: Secondary | ICD-10-CM | POA: Insufficient documentation

## 2021-12-23 DIAGNOSIS — F4310 Post-traumatic stress disorder, unspecified: Secondary | ICD-10-CM | POA: Insufficient documentation

## 2021-12-23 DIAGNOSIS — F323 Major depressive disorder, single episode, severe with psychotic features: Secondary | ICD-10-CM | POA: Insufficient documentation

## 2021-12-23 DIAGNOSIS — Z7951 Long term (current) use of inhaled steroids: Secondary | ICD-10-CM | POA: Insufficient documentation

## 2021-12-23 DIAGNOSIS — F1220 Cannabis dependence, uncomplicated: Secondary | ICD-10-CM | POA: Insufficient documentation

## 2021-12-23 DIAGNOSIS — J45909 Unspecified asthma, uncomplicated: Secondary | ICD-10-CM | POA: Insufficient documentation

## 2021-12-23 DIAGNOSIS — D72829 Elevated white blood cell count, unspecified: Secondary | ICD-10-CM | POA: Insufficient documentation

## 2021-12-24 ENCOUNTER — Other Ambulatory Visit: Payer: Self-pay

## 2021-12-24 ENCOUNTER — Encounter (HOSPITAL_COMMUNITY): Payer: Self-pay

## 2021-12-24 ENCOUNTER — Emergency Department (HOSPITAL_COMMUNITY)
Admission: EM | Admit: 2021-12-24 | Discharge: 2021-12-25 | Disposition: A | Discharge status: Home / Self Care | Payer: Medicaid Other | Attending: Emergency Medicine | Admitting: Emergency Medicine

## 2021-12-24 DIAGNOSIS — R45851 Suicidal ideations: Secondary | ICD-10-CM

## 2021-12-24 DIAGNOSIS — E876 Hypokalemia: Secondary | ICD-10-CM

## 2021-12-24 LAB — BASIC METABOLIC PANEL
Anion gap: 7 (ref 5–15)
BUN: 8 mg/dL (ref 6–20)
CO2: 23 mmol/L (ref 22–32)
Calcium: 8.5 mg/dL — ABNORMAL LOW (ref 8.9–10.3)
Chloride: 107 mmol/L (ref 98–111)
Creatinine, Ser: 0.77 mg/dL (ref 0.44–1.00)
GFR, Estimated: >60 mL/min (ref >60)
Glucose, Bld: 319 mg/dL — ABNORMAL HIGH (ref 70–99)
Potassium: 3.4 mmol/L — ABNORMAL LOW (ref 3.5–5.1)
Sodium: 137 mmol/L (ref 135–145)

## 2021-12-24 LAB — RAPID URINE DRUG SCREEN, HOSP PERFORMED
Amphetamines: NOT DETECTED
Barbiturates: NOT DETECTED
Benzodiazepines: NOT DETECTED
Cocaine: NOT DETECTED
Opiates: NOT DETECTED
Tetrahydrocannabinol: POSITIVE — AB

## 2021-12-24 LAB — CBC
HCT: 43.1 % (ref 36.0–46.0)
Hemoglobin: 15.4 g/dL — ABNORMAL HIGH (ref 12.0–15.0)
MCH: 31.6 pg (ref 26.0–34.0)
MCHC: 35.7 g/dL (ref 30.0–36.0)
MCV: 88.3 fL (ref 80.0–100.0)
Platelets: 302 10*3/uL (ref 150–400)
RBC: 4.88 MIL/uL (ref 3.87–5.11)
RDW: 12.7 % (ref 11.5–15.5)
WBC: 11.6 10*3/uL — ABNORMAL HIGH (ref 4.0–10.5)
nRBC: 0 % (ref 0.0–0.2)

## 2021-12-24 LAB — ACETAMINOPHEN LEVEL: Acetaminophen (Tylenol), Serum: <10 ug/mL — ABNORMAL LOW (ref 10–30)

## 2021-12-24 LAB — COMPREHENSIVE METABOLIC PANEL
ALT: 29 U/L (ref 0–44)
AST: 25 U/L (ref 15–41)
Albumin: 4 g/dL (ref 3.5–5.0)
Alkaline Phosphatase: 73 U/L (ref 38–126)
Anion gap: 9 (ref 5–15)
BUN: 11 mg/dL (ref 6–20)
CO2: 24 mmol/L (ref 22–32)
Calcium: 9 mg/dL (ref 8.9–10.3)
Chloride: 101 mmol/L (ref 98–111)
Creatinine, Ser: 0.67 mg/dL (ref 0.44–1.00)
GFR, Estimated: >60 mL/min (ref >60)
Glucose, Bld: 259 mg/dL — ABNORMAL HIGH (ref 70–99)
Potassium: 2.7 mmol/L — CL (ref 3.5–5.1)
Sodium: 134 mmol/L — ABNORMAL LOW (ref 135–145)
Total Bilirubin: 0.8 mg/dL (ref 0.3–1.2)
Total Protein: 7 g/dL (ref 6.5–8.1)

## 2021-12-24 LAB — RESP PANEL BY RT-PCR (FLU A&B, COVID) ARPGX2
Influenza A by PCR: NEGATIVE
Influenza B by PCR: NEGATIVE
SARS Coronavirus 2 by RT PCR: NEGATIVE

## 2021-12-24 LAB — HCG, QUANTITATIVE, PREGNANCY: hCG, Beta Chain, Quant, S: 1 m[IU]/mL (ref <5)

## 2021-12-24 LAB — CBG MONITORING, ED: Glucose-Capillary: 222 mg/dL — ABNORMAL HIGH (ref 70–99)

## 2021-12-24 LAB — ETHANOL: Alcohol, Ethyl (B): <10 mg/dL (ref <10)

## 2021-12-24 LAB — SALICYLATE LEVEL: Salicylate Lvl: <7 mg/dL — ABNORMAL LOW (ref 7.0–30.0)

## 2021-12-24 MED ORDER — HYDROXYZINE HCL 25 MG PO TABS: 25.0000 mg | ORAL_TABLET | Freq: Three times a day (TID) | ORAL | Status: DC | PRN

## 2021-12-24 MED ORDER — LIP MEDEX EX OINT
TOPICAL_OINTMENT | Freq: Once | CUTANEOUS | Status: AC
Start: 2021-12-24 — End: 2021-12-24
  Filled 2021-12-24: qty 7

## 2021-12-24 MED ORDER — ALBUTEROL SULFATE HFA 108 (90 BASE) MCG/ACT IN AERS
1.0000 | INHALATION_SPRAY | RESPIRATORY_TRACT | Status: DC | PRN
  Administered 2021-12-25: 2 via RESPIRATORY_TRACT
  Filled 2021-12-24: qty 6.7

## 2021-12-24 MED ORDER — TRAZODONE HCL 100 MG PO TABS: 50.0000 mg | ORAL_TABLET | Freq: Every evening | ORAL | Status: DC | PRN

## 2021-12-24 MED ORDER — LORAZEPAM 1 MG PO TABS
1.0000 mg | ORAL_TABLET | Freq: Once | ORAL | Status: AC
  Administered 2021-12-24: 1 mg via ORAL
  Filled 2021-12-24: qty 1

## 2021-12-24 MED ORDER — EPINEPHRINE 0.3 MG/0.3ML IJ SOAJ: 0.3000 mg | INTRAMUSCULAR | Status: DC | PRN

## 2021-12-24 MED ORDER — MELATONIN 3 MG PO TABS
6.0000 mg | ORAL_TABLET | Freq: Every day | ORAL | Status: DC
  Administered 2021-12-24: 6 mg via ORAL
  Filled 2021-12-24: qty 2

## 2021-12-24 MED ORDER — CEPHALEXIN 500 MG PO CAPS
500.0000 mg | ORAL_CAPSULE | Freq: Four times a day (QID) | ORAL | Status: DC
  Filled 2021-12-24: qty 1

## 2021-12-24 MED ORDER — POTASSIUM CHLORIDE CRYS ER 20 MEQ PO TBCR
40.0000 meq | EXTENDED_RELEASE_TABLET | Freq: Once | ORAL | Status: AC
  Administered 2021-12-24: 40 meq via ORAL
  Filled 2021-12-24: qty 2

## 2021-12-24 MED ORDER — FLUTICASONE PROPIONATE 50 MCG/ACT NA SUSP
2.0000 | Freq: Every day | NASAL | Status: DC
  Administered 2021-12-24: 2 via NASAL
  Filled 2021-12-24: qty 16

## 2021-12-24 MED ORDER — METFORMIN HCL 500 MG PO TABS
500.0000 mg | ORAL_TABLET | Freq: Two times a day (BID) | ORAL | Status: DC
  Filled 2021-12-24: qty 1

## 2021-12-24 MED ORDER — ACETAMINOPHEN 325 MG PO TABS
650.0000 mg | ORAL_TABLET | Freq: Once | ORAL | Status: AC
  Administered 2021-12-24: 650 mg via ORAL
  Filled 2021-12-24: qty 2

## 2021-12-24 MED ORDER — POTASSIUM CHLORIDE 10 MEQ/100ML IV SOLN
10.0000 meq | INTRAVENOUS | Status: AC
  Administered 2021-12-24 (×2): 10 meq via INTRAVENOUS
  Filled 2021-12-24 (×2): qty 100

## 2021-12-24 MED ORDER — FLUPHENAZINE HCL 5 MG PO TABS: 5.0000 mg | ORAL_TABLET | Freq: Three times a day (TID) | ORAL | Status: DC

## 2021-12-24 NOTE — ED Notes (Signed)
Pt wanded by security; belongings taken and stored in pt storage area by [facility].

## 2021-12-24 NOTE — ED Notes (Signed)
Pt received dinner tray.

## 2021-12-24 NOTE — ED Provider Notes (Signed)
Emergency Medicine Observation Re-evaluation Note  Erika Rhodes is a 41 y.o. female, seen on rounds today at 0700.  Pt initially presented to the ED for complaints of Suicidal Currently, the patient is resting comfortably.  Physical Exam  BP (!) 128/97 (BP Location: Left Arm)    Pulse 97    Temp 99.1 F (37.3 C) (Oral)    Resp 17    SpO2 98%  Physical Exam General: NAD   ED Course / MDM  EKG:   I have reviewed the labs performed to date as well as medications administered while in observation.  Recent changes in the last 24 hours include no acute events reported.  Plan  Current plan is for pending TTS / Psych evaluation.  Erika Rhodes is not under involuntary commitment.     Wynetta Fines, MD 12/24/21 [phone]

## 2021-12-24 NOTE — ED Notes (Signed)
Pt is agitated at this time and pacing the hallway. Pt is re-directable to her bed, but endorses that she needs to be given soda during the night even though the rules state that she cannot have more food until morning.

## 2021-12-24 NOTE — ED Notes (Addendum)
Inpatient care recommended for patient. Social work is currently looking for placement and RN will be informed when placement is found. Per Nira ConnJason Berry, NP, pt will not meet IVC criteria if she decides to leave the facility.
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
## 2021-12-24 NOTE — BH Assessment (Addendum)
@  [place] notified patient's nurse (I-Li Arminda Resides, RN) that patient is next in line to be seen for ED patient's, however; will need to see another patient that presented before this patient.   @1209 , notified nursing staff that it's a delay in seeing patient due to walk-ins.   @1544 , notified patient's nurse (I-Li Arminda Resides, RN) and Jarrett Soho, RN) that this TTS Clinician is available and ready to complete patient's TTS assessment. Requested TTS machine to be placed in patient's room.   @1604 , informed by patient's nurse Jarrett Soho, RN, that a room has to be located to move patient to for her TTS assessment. Jarrett Soho, RN, will let this Clinician know when patient is ready to be seen.   @1709 , patient's nurse informed that [facility] now has a walk in to see. Will attempt to see patient following the walk in.   @1935 , requested patient's nurse Wells Guiles, RN), to place the TTS machine in patient's room for her initial TTS assessment.   @1950 , followed up with patient's nurse and NT ([place]) to see if someone could move the TTS machine to patient's room. Clinician ready to complete patient's initial TTS assessment.
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
## 2021-12-24 NOTE — Progress Notes (Signed)
Inpatient Diabetes Program Recommendations  AACE/ADA: New Consensus Statement on Inpatient Glycemic Control (2015)  Target Ranges:  Prepandial:   less than 140 mg/dL      Peak postprandial:   less than 180 mg/dL (1-2 hours)      Critically ill patients:  140 - 180 mg/dL   Lab Results  Component Value Date   GLUCAP 194 (H) 10/06/2021   HGBA1C 7.6 (H) 10/09/2021    Review of Glycemic Control  Diabetes history: DM 2 Outpatient Diabetes medications: metformin in the past Current orders for Inpatient glycemic control:  None in the ED waiting for Psych evaluation  A1c 7.6% on 10/09/2021  Inpatient Diabetes Program Recommendations:    -  Restart metformin dose listed on home med rec -  CBG checks tid   Thanks,  Christena Deem RN, MSN, BC-ADM Inpatient Diabetes Coordinator Team Pager [phone] (8a-5p)

## 2021-12-24 NOTE — ED Notes (Signed)
Pt sitting up, drinking water and whispering to herself.

## 2021-12-25 ENCOUNTER — Ambulatory Visit (HOSPITAL_COMMUNITY)
Admission: EM | Admit: 2021-12-25 | Discharge: 2021-12-25 | Disposition: A | Discharge status: Home / Self Care | Payer: No Payment, Other | Attending: Behavioral Health | Admitting: Behavioral Health

## 2021-12-25 DIAGNOSIS — Z5900 Homelessness unspecified: Secondary | ICD-10-CM | POA: Insufficient documentation

## 2021-12-25 DIAGNOSIS — R45851 Suicidal ideations: Secondary | ICD-10-CM | POA: Insufficient documentation

## 2021-12-25 DIAGNOSIS — F1910 Other psychoactive substance abuse, uncomplicated: Secondary | ICD-10-CM | POA: Insufficient documentation

## 2021-12-25 DIAGNOSIS — Z62810 Personal history of physical and sexual abuse in childhood: Secondary | ICD-10-CM | POA: Insufficient documentation

## 2021-12-25 DIAGNOSIS — Z9151 Personal history of suicidal behavior: Secondary | ICD-10-CM | POA: Insufficient documentation

## 2021-12-25 DIAGNOSIS — F603 Borderline personality disorder: Secondary | ICD-10-CM | POA: Insufficient documentation

## 2021-12-25 DIAGNOSIS — Z765 Malingerer [conscious simulation]: Secondary | ICD-10-CM | POA: Insufficient documentation

## 2021-12-25 DIAGNOSIS — R451 Restlessness and agitation: Secondary | ICD-10-CM | POA: Insufficient documentation

## 2021-12-25 DIAGNOSIS — Z630 Problems in relationship with spouse or partner: Secondary | ICD-10-CM | POA: Insufficient documentation

## 2021-12-25 DIAGNOSIS — R4587 Impulsiveness: Secondary | ICD-10-CM | POA: Insufficient documentation

## 2021-12-25 DIAGNOSIS — Z653 Problems related to other legal circumstances: Secondary | ICD-10-CM | POA: Insufficient documentation

## 2021-12-25 MED ORDER — ACETAMINOPHEN 325 MG PO TABS: 650.0000 mg | ORAL_TABLET | Freq: Four times a day (QID) | ORAL | Status: DC | PRN

## 2021-12-25 MED ORDER — INSULIN ASPART 100 UNIT/ML IJ SOLN
0.0000 [IU] | Freq: Every day | INTRAMUSCULAR | Status: DC
  Filled 2021-12-25: qty 0.05

## 2021-12-25 MED ORDER — ALUM & MAG HYDROXIDE-SIMETH 200-200-20 MG/5ML PO SUSP: 30.0000 mL | ORAL | Status: DC | PRN

## 2021-12-25 MED ORDER — MAGNESIUM HYDROXIDE 400 MG/5ML PO SUSP: 30.0000 mL | Freq: Every day | ORAL | Status: DC | PRN

## 2021-12-25 MED ORDER — HYDROXYZINE HCL 25 MG PO TABS: 25.0000 mg | ORAL_TABLET | Freq: Three times a day (TID) | ORAL | Status: DC | PRN

## 2021-12-25 MED ORDER — OXYMETAZOLINE HCL 0.05 % NA SOLN
1.0000 | Freq: Two times a day (BID) | NASAL | Status: DC
  Administered 2021-12-25: 1 via NASAL
  Filled 2021-12-25: qty 30

## 2021-12-25 MED ORDER — INSULIN ASPART 100 UNIT/ML IJ SOLN
0.0000 [IU] | Freq: Three times a day (TID) | INTRAMUSCULAR | Status: DC
  Filled 2021-12-25: qty 0.15

## 2021-12-25 NOTE — Consult Note (Signed)
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 resources for [address], [facility] for open access, and has failed to follow up.   Suella Broad, FNP 12/25/2021 11:01 AM
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
## 2021-12-25 NOTE — BH Assessment (Addendum)
[facility] Assessment Progress Note   Per Sheran Fava, NP , this voluntary pt does not require psychiatric hospitalization at this time.  Pt is psychiatrically cleared.  Discharge instructions include referral information for [facility].  EDP Marda Stalker, MD and pt's nurse, I-Li, have been notified.  Jalene Mullet, MA Triage Specialist [phone]  Addendum:  At Takia's request, shelter resources and other supportive services for the homeless have been added to discharge instructions.  Jalene Mullet, [place] Coordinator [phone]

## 2021-12-25 NOTE — ED Notes (Signed)
Pt escorted calmly to lobby. Pt then walked toward bathroom and over to registration. This RN informed pt was attempting to check back in but refused this RN to care for her. Sharon Seller RN spoke with Janice Coffin to confirm pt was unable to check back in and could be escorted off property if needed. Labs confirmed that pt not pregnant as she stated. Belongings returned, sock provided. Security and [employer] present in lobby.
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
## 2021-12-25 NOTE — Consult Note (Signed)
Immediately after discharge patient walked into the lobby, walked towards the bathroom then attempted to check back into the emergency. She originally stated she was pregnant, and was in labor. Then she states she had an a head injury, despite being in the emergency room for the past 36 hours. Patient did not present with any obvious deformities or physical injuries. Patient remains psychiatrically cleared. Other facilities in the area, were notified of patients likelihood to present after being discharged from the ED.

## 2021-12-25 NOTE — ED Notes (Signed)
Patient received After Visit Summary with shelter and community resources.

## 2021-12-25 NOTE — Discharge Instructions (Addendum)
For your behavioral health needs you are advised to follow up with [facility] at your earliest opportunity:      [facility]      [place], [place] [address]      [phone]      They offer psychiatry/medication management, therapy and substance use disorder treatment.  New patients are seen in their walk-in clinic.  Walk-in hours are Monday - Thursday from 8:00 am - 11:00 am for psychiatry, and Friday from 1:00 pm - 4:00 pm for therapy.  Walk-in patients are seen on a first come, first served basis, so try to arrive as early as possible for the best chance of being seen the same day.  Please note that to be eligible for services you must bring an ID or a piece of mail with your name and a [facility] address.  For your shelter needs, contact the following service providers:       [facility] (operated by [facility])      [place], [place] [address]      [phone]       [facility]      [address]      [place], [place] [address]      [phone]   For day shelter and other supportive services for the homeless, contact the [facility]):       [facility]      [place], [place] [address]      [phone]  For transitional housing, Secondary school teacher.  They provide longer term housing than a shelter, but there is an application process:       [employer] [facility] [employer] [place]. [address], [place] [address]      [phone]

## 2021-12-25 NOTE — Discharge Instructions (Addendum)

## 2021-12-25 NOTE — Progress Notes (Signed)
[facility] at [facility]. First shift to follow up.   Maryjean Kahelsea N. Kamaria Lucia, MSW, LCSWA 12/25/2021 1:14 AM

## 2021-12-25 NOTE — ED Provider Notes (Addendum)
Emergency Medicine Observation Re-evaluation Note  Erika Rhodes is a 41 y.o. female, seen on rounds today.  Pt initially presented to the ED for complaints of Suicidal and Psychiatric Evaluation Currently, the patient is resting comfortably awaiting psychiatry to reevaluate and give a plan.  Physical Exam  BP 122/81    Pulse 88    Temp 98.9 F (37.2 C) (Oral)    Resp 18    SpO2 99%  Physical Exam General: Resting Cardiac: No murmur Lungs: Clear Psych: No complaints and resting in the hall bed  ED Course / MDM  EKG:EKG Interpretation  Date/Time:  Wednesday December 24 2021 00:26:38 EST Ventricular Rate:  101 PR Interval:  146 QRS Duration: 78 QT Interval:  364 QTC Calculation: 471 R Axis:   4 Text Interpretation: Sinus tachycardia Septal infarct , age undetermined Abnormal ECG When compared with ECG of 18-Oct-2021 12:08, HEART RATE has increased Confirmed by Dione Booze ([id number]) on 12/25/2021 3:47:47 AM  I have reviewed the labs performed to date as well as medications administered while in observation.  Recent changes in the last 24 hours include none.  Plan  Current plan is for pending TTS/psych recommendations.  Anell Doublin is not under involuntary commitment.     Gaelle Adriance, Canary Brim, MD 12/25/21 0843   11:13 AM Transition of care team reports that patient is now psychiatrically cleared and safe for discharge home as she is voluntary.  We will get patient's paperwork together and she will be discharged for outpatient follow-up.  I do spoke to the patient and she requested to speak to case management 1 more time about resources at discharge so that she " does not do something drastic".  We will have case management come talk to her but we will get her paperwork together for discharge.   11:47 AM Behavioral health team came and talk to her again and still feel she is safe for discharge home.  They do not feel she would benefit from inpatient and request to be  discharged with security.  Per their plan, we will discharge.Rush Landmark, Canary Brim, MD 12/25/21 1147

## 2021-12-26 ENCOUNTER — Other Ambulatory Visit (HOSPITAL_COMMUNITY)
Admission: EM | Admit: 2021-12-26 | Discharge: 2021-12-29 | Disposition: A | Discharge status: Home / Self Care | Payer: No Payment, Other | Attending: Psychiatry | Admitting: Psychiatry

## 2021-12-26 DIAGNOSIS — Z20822 Contact with and (suspected) exposure to covid-19: Secondary | ICD-10-CM | POA: Insufficient documentation

## 2021-12-26 DIAGNOSIS — R45851 Suicidal ideations: Secondary | ICD-10-CM | POA: Insufficient documentation

## 2021-12-26 DIAGNOSIS — F332 Major depressive disorder, recurrent severe without psychotic features: Secondary | ICD-10-CM | POA: Insufficient documentation

## 2021-12-26 LAB — COMPREHENSIVE METABOLIC PANEL
ALT: 32 U/L (ref 0–44)
AST: 24 U/L (ref 15–41)
Albumin: 4.2 g/dL (ref 3.5–5.0)
Alkaline Phosphatase: 82 U/L (ref 38–126)
Anion gap: 12 (ref 5–15)
BUN: 7 mg/dL (ref 6–20)
CO2: 22 mmol/L (ref 22–32)
Calcium: 9.2 mg/dL (ref 8.9–10.3)
Chloride: 98 mmol/L (ref 98–111)
Creatinine, Ser: 0.74 mg/dL (ref 0.44–1.00)
GFR, Estimated: >60 mL/min (ref >60)
Glucose, Bld: 337 mg/dL — ABNORMAL HIGH (ref 70–99)
Potassium: 3.4 mmol/L — ABNORMAL LOW (ref 3.5–5.1)
Sodium: 132 mmol/L — ABNORMAL LOW (ref 135–145)
Total Bilirubin: 0.7 mg/dL (ref 0.3–1.2)
Total Protein: 7.8 g/dL (ref 6.5–8.1)

## 2021-12-26 LAB — POCT URINE DRUG SCREEN - MANUAL ENTRY (I-SCREEN)
POC Amphetamine UR: NOT DETECTED
POC Buprenorphine (BUP): NOT DETECTED
POC Cocaine UR: NOT DETECTED
POC Marijuana UR: NOT DETECTED
POC Methadone UR: NOT DETECTED
POC Methamphetamine UR: NOT DETECTED
POC Morphine: NOT DETECTED
POC Oxazepam (BZO): NOT DETECTED
POC Oxycodone UR: NOT DETECTED
POC Secobarbital (BAR): NOT DETECTED

## 2021-12-26 LAB — CBC WITH DIFFERENTIAL/PLATELET
Abs Immature Granulocytes: 0.02 10*3/uL (ref 0.00–0.07)
Basophils Absolute: 0 10*3/uL (ref 0.0–0.1)
Basophils Relative: 0 %
Eosinophils Absolute: 0.1 10*3/uL (ref 0.0–0.5)
Eosinophils Relative: 1 %
HCT: 42.2 % (ref 36.0–46.0)
Hemoglobin: 15.3 g/dL — ABNORMAL HIGH (ref 12.0–15.0)
Immature Granulocytes: 0 %
Lymphocytes Relative: 26 %
Lymphs Abs: 2.9 10*3/uL (ref 0.7–4.0)
MCH: 31.3 pg (ref 26.0–34.0)
MCHC: 36.3 g/dL — ABNORMAL HIGH (ref 30.0–36.0)
MCV: 86.3 fL (ref 80.0–100.0)
Monocytes Absolute: 0.6 10*3/uL (ref 0.1–1.0)
Monocytes Relative: 5 %
Neutro Abs: 7.4 10*3/uL (ref 1.7–7.7)
Neutrophils Relative %: 68 %
Platelets: 327 10*3/uL (ref 150–400)
RBC: 4.89 MIL/uL (ref 3.87–5.11)
RDW: 12.4 % (ref 11.5–15.5)
WBC: 10.9 10*3/uL — ABNORMAL HIGH (ref 4.0–10.5)
nRBC: 0 % (ref 0.0–0.2)

## 2021-12-26 LAB — RESP PANEL BY RT-PCR (FLU A&B, COVID) ARPGX2
Influenza A by PCR: NEGATIVE
Influenza B by PCR: NEGATIVE
SARS Coronavirus 2 by RT PCR: NEGATIVE

## 2021-12-26 LAB — POCT PREGNANCY, URINE: Preg Test, Ur: NEGATIVE

## 2021-12-26 LAB — LIPID PANEL
Cholesterol: 213 mg/dL — ABNORMAL HIGH (ref 0–200)
HDL: 50 mg/dL (ref >40)
LDL Cholesterol: 126 mg/dL — ABNORMAL HIGH (ref 0–99)
Total CHOL/HDL Ratio: 4.3 RATIO
Triglycerides: 185 mg/dL — ABNORMAL HIGH (ref <150)
VLDL: 37 mg/dL (ref 0–40)

## 2021-12-26 LAB — POC SARS CORONAVIRUS 2 AG -  ED: SARS Coronavirus 2 Ag: NEGATIVE

## 2021-12-26 LAB — POC SARS CORONAVIRUS 2 AG: SARSCOV2ONAVIRUS 2 AG: NEGATIVE

## 2021-12-26 LAB — TSH: TSH: 7.098 u[IU]/mL — ABNORMAL HIGH (ref 0.350–4.500)

## 2021-12-26 MED ORDER — ACETAMINOPHEN 325 MG PO TABS
650.0000 mg | ORAL_TABLET | Freq: Four times a day (QID) | ORAL | Status: DC | PRN
  Administered 2021-12-28 – 2021-12-29 (×2): 650 mg via ORAL
  Filled 2021-12-26 (×2): qty 2

## 2021-12-26 MED ORDER — ALUM & MAG HYDROXIDE-SIMETH 200-200-20 MG/5ML PO SUSP: 30.0000 mL | ORAL | Status: DC | PRN

## 2021-12-26 MED ORDER — MAGNESIUM HYDROXIDE 400 MG/5ML PO SUSP: 30.0000 mL | Freq: Every day | ORAL | Status: DC | PRN

## 2021-12-26 MED ORDER — TRAZODONE HCL 50 MG PO TABS
50.0000 mg | ORAL_TABLET | Freq: Every evening | ORAL | Status: DC | PRN
  Administered 2021-12-27 – 2021-12-28 (×3): 50 mg via ORAL
  Filled 2021-12-26 (×3): qty 1

## 2021-12-26 MED ORDER — HYDROXYZINE HCL 25 MG PO TABS
25.0000 mg | ORAL_TABLET | Freq: Three times a day (TID) | ORAL | Status: DC | PRN
  Administered 2021-12-28 – 2021-12-29 (×2): 25 mg via ORAL
  Filled 2021-12-26 (×2): qty 1

## 2021-12-26 NOTE — ED Notes (Signed)
Pt sitting in bed eating dinner provided. Pt A&Ox4 with no signs of acute distress. Waiting on pcr covid test results for pending transfer to [facility]. Will continue to monitor for safety.

## 2021-12-26 NOTE — ED Triage Notes (Signed)
Pt presents to [facility] with chronic SI with a plan to jump in front of traffic. Pt was seen yesterday at [facility] with the same complaint. Pt states that she is homeless and had to sleep in the cold lastnight due to not having a bed available for her. Pt states that if she leaves she will follow through with her plan. Pt states that she did not jump in front of traffic today because she has a " small glimpse of hope". Pt denies HI and AVH. Pt is urgent.
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
## 2021-12-26 NOTE — ED Notes (Signed)
Pt rapid covid test negative. [facility] admission pending pcr test.

## 2021-12-26 NOTE — ED Provider Notes (Signed)
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 10/01/2021 139  135 - 145 mmol/L Final   Potassium 10/01/2021 3.8  3.5 - 5.1 mmol/L Final   Chloride 10/01/2021 104  98 - 111 mmol/L Final   CO2 10/01/2021 27  22 - 32 mmol/L Final   Glucose, Bld 10/01/2021 191 (H)  70 - 99 mg/dL Final   Glucose reference range applies only to samples taken after fasting for at least 8 hours.   BUN 10/01/2021 16  6 - 20 mg/dL Final   Creatinine, Ser 10/01/2021 0.91  0.44 - 1.00 mg/dL Final   Calcium 10/01/2021 9.2  8.9 - 10.3 mg/dL Final   Total Protein 10/01/2021 7.8  6.5 - 8.1 g/dL Final   Albumin 10/01/2021 4.4  3.5 - 5.0 g/dL Final   AST 10/01/2021 48 (H)  15 - 41 U/L Final   ALT 10/01/2021 57 (H)  0 - 44 U/L Final   Alkaline Phosphatase 10/01/2021 77  38 - 126 U/L Final   Total Bilirubin 10/01/2021 1.2  0.3 - 1.2 mg/dL Final   GFR, Estimated 10/01/2021 >60  >60 mL/min Final   Comment: (NOTE) Calculated using the CKD-EPI Creatinine Equation (2021)    Anion gap 10/01/2021 8  5 - 15 Final   Performed at [facility], [place] [address] [place], [place] [address]   Color, Urine 10/01/2021 YELLOW  YELLOW Final   APPearance 10/01/2021 HAZY (A)  CLEAR Final   Specific Gravity, Urine 10/01/2021 1.020  1.005 - 1.030 Final   pH 10/01/2021 5.0  5.0 - 8.0 Final   Glucose, UA 10/01/2021 NEGATIVE  NEGATIVE mg/dL Final   Hgb urine dipstick 10/01/2021 NEGATIVE  NEGATIVE Final   Bilirubin Urine 10/01/2021 NEGATIVE  NEGATIVE Final   Ketones, ur 10/01/2021 NEGATIVE  NEGATIVE mg/dL Final   Protein, ur 10/01/2021 NEGATIVE  NEGATIVE mg/dL Final   Nitrite 10/01/2021 NEGATIVE  NEGATIVE Final   Leukocytes,Ua 10/01/2021 NEGATIVE  NEGATIVE Final   Performed at [place] [address] [place], [place] [id number]   Salicylate Lvl A999333 <7.0 (L)  7.0 - 30.0 mg/dL Final   Performed at [place] [address] [place], [place] [address]   Acetaminophen (Tylenol), Serum 10/01/2021 <10 (L)  10 - 30  ug/mL Final   Comment: (NOTE) Therapeutic concentrations vary significantly. A range of 10-30 ug/mL  may be an effective concentration for many patients. However, some  are best treated at concentrations outside of this range. Acetaminophen concentrations >150 ug/mL at 4 hours after ingestion  and >50 ug/mL at 12 hours after ingestion are often associated with  toxic reactions.  Performed at [facility], [place] [address] [place], [place] [address]    Alcohol, Ethyl (B) 10/01/2021 <10  <10 mg/dL Final   Comment: (NOTE) Lowest detectable limit for serum alcohol is 10 mg/dL.  For medical purposes only. Performed at [facility], [place] [address], [place], [place] [address]    SARS Coronavirus 2 by RT PCR 10/02/2021 NEGATIVE  NEGATIVE Final   Comment: (NOTE) SARS-CoV-2 target nucleic acids are NOT DETECTED.  The SARS-CoV-2 RNA is generally detectable in upper respiratory specimens during the acute phase of infection. The lowest concentration of SARS-CoV-2 viral copies this assay can detect is 138 copies/mL. A negative result does not preclude SARS-Cov-2 infection and should not be used as the sole basis for treatment or other patient management decisions. A negative result may occur with  improper specimen collection/handling, submission of specimen other than nasopharyngeal swab, presence of viral mutation(s) within the areas targeted by this assay, and inadequate number of viral copies(<138 copies/mL). A negative result must be combined with clinical observations, patient history, and epidemiological information. The expected result is Negative.  Fact Sheet for Patients:  EntrepreneurPulse.com.au  Fact Sheet for Healthcare Providers:  IncredibleEmployment.be  This test is no                          t yet approved or cleared by the [place] FDA and  has been authorized for detection and/or diagnosis  of SARS-CoV-2 by FDA under an Emergency Use Authorization (EUA). This EUA will remain  in effect (meaning this test can be used) for the duration of the COVID-19 declaration under Section 564(b)(1) of the Act, 21 U.S.C.section 360bbb-3(b)(1), unless the authorization is terminated  or revoked sooner.       Influenza A by PCR 10/02/2021 NEGATIVE  NEGATIVE Final   Influenza B by PCR 10/02/2021 NEGATIVE  NEGATIVE Final   Comment: (NOTE) The Xpert Xpress SARS-CoV-2/FLU/RSV plus assay is intended as an aid in the diagnosis of influenza from Nasopharyngeal swab specimens and should not be used as a sole basis for treatment. Nasal washings and aspirates are unacceptable for Xpert Xpress SARS-CoV-2/FLU/RSV testing.  Fact Sheet for Patients: EntrepreneurPulse.com.au  Fact Sheet for Healthcare Providers: IncredibleEmployment.be  This test is not yet approved or cleared by the [place] FDA and has been authorized for detection and/or diagnosis of SARS-CoV-2 by FDA under an Emergency Use Authorization (EUA). This EUA will remain in effect (meaning this test can be used) for the duration of the COVID-19 declaration under Section 564(b)(1) of the Act, 21 U.S.C. section 360bbb-3(b)(1), unless the authorization is terminated or revoked.  Performed at [facility], [place] [address], [place], [place] [address]   Admission on 09/24/2021, Discharged on 09/24/2021  Component Date Value Ref Range Status   WBC 09/24/2021 14.7 (H)  4.0 - 10.5 K/uL Final   RBC 09/24/2021 4.93  3.87 - 5.11 MIL/uL Final   Hemoglobin 09/24/2021 15.8 (H)  12.0 - 15.0 g/dL Final   HCT 09/24/2021 42.7  36.0 - 46.0 % Final   MCV 09/24/2021 86.6  80.0 -  100.0 fL Final   MCH 09/24/2021 32.0  26.0 - 34.0 pg Final   MCHC 09/24/2021 37.0 (H)  30.0 - 36.0 g/dL Final   RDW 09/24/2021 12.1  11.5 - 15.5 % Final   Platelets 09/24/2021 444 (H)  150 - 400 K/uL Final   nRBC  09/24/2021 0.0  0.0 - 0.2 % Final   Neutrophils Relative % 09/24/2021 88  % Final   Neutro Abs 09/24/2021 13.0 (H)  1.7 - 7.7 K/uL Final   Lymphocytes Relative 09/24/2021 8  % Final   Lymphs Abs 09/24/2021 1.1  0.7 - 4.0 K/uL Final   Monocytes Relative 09/24/2021 4  % Final   Monocytes Absolute 09/24/2021 0.5  0.1 - 1.0 K/uL Final   Eosinophils Relative 09/24/2021 0  % Final   Eosinophils Absolute 09/24/2021 0.0  0.0 - 0.5 K/uL Final   Basophils Relative 09/24/2021 0  % Final   Basophils Absolute 09/24/2021 0.0  0.0 - 0.1 K/uL Final   Immature Granulocytes 09/24/2021 0  % Final   Abs Immature Granulocytes 09/24/2021 0.04  0.00 - 0.07 K/uL Final   Performed at [facility], [place] [address], [place], [place] [address]   Sodium 09/24/2021 131 (L)  135 - 145 mmol/L Final   Potassium 09/24/2021 3.3 (L)  3.5 - 5.1 mmol/L Final   Chloride 09/24/2021 99  98 - 111 mmol/L Final   CO2 09/24/2021 16 (L)  22 - 32 mmol/L Final   Glucose, Bld 09/24/2021 396 (H)  70 - 99 mg/dL Final   Glucose reference range applies only to samples taken after fasting for at least 8 hours.   BUN 09/24/2021 9  6 - 20 mg/dL Final   Creatinine, Ser 09/24/2021 1.00  0.44 - 1.00 mg/dL Final   Calcium 09/24/2021 9.4  8.9 - 10.3 mg/dL Final   Total Protein 09/24/2021 8.5 (H)  6.5 - 8.1 g/dL Final   Albumin 09/24/2021 4.6  3.5 - 5.0 g/dL Final   AST 09/24/2021 39  15 - 41 U/L Final   ALT 09/24/2021 46 (H)  0 - 44 U/L Final   Alkaline Phosphatase 09/24/2021 82  38 - 126 U/L Final   Total Bilirubin 09/24/2021 1.0  0.3 - 1.2 mg/dL Final   GFR, Estimated 09/24/2021 >60  >60 mL/min Final   Comment: (NOTE) Calculated using the CKD-EPI Creatinine Equation (2021)    Anion gap 09/24/2021 16 (H)  5 - 15 Final   Performed at [facility], [place] [address] [place], [place] [address]   Opiates 09/24/2021 NONE DETECTED  NONE DETECTED Final   Cocaine 09/24/2021 NONE DETECTED  NONE DETECTED Final    Benzodiazepines 09/24/2021 NONE DETECTED  NONE DETECTED Final   Amphetamines 09/24/2021 NONE DETECTED  NONE DETECTED Final   Tetrahydrocannabinol 09/24/2021 POSITIVE (A)  NONE DETECTED Final   Barbiturates 09/24/2021 NONE DETECTED  NONE DETECTED Final   Comment: (NOTE) DRUG SCREEN FOR MEDICAL PURPOSES ONLY.  IF CONFIRMATION IS NEEDED FOR ANY PURPOSE, NOTIFY LAB WITHIN 5 DAYS.  LOWEST DETECTABLE LIMITS FOR URINE DRUG SCREEN Drug Class                     Cutoff (ng/mL) Amphetamine and metabolites    1000 Barbiturate and metabolites    200 Benzodiazepine                 [id number] Tricyclics and metabolites     300 Opiates and metabolites  300 Cocaine and metabolites        300 THC                            50 Performed at [facility], [place] [address] [place], [place] [address]    Acetaminophen (Tylenol), Serum 09/24/2021 <10 (L)  10 - 30 ug/mL Final   Comment: (NOTE) Therapeutic concentrations vary significantly. A range of 10-30 ug/mL  may be an effective concentration for many patients. However, some  are best treated at concentrations outside of this range. Acetaminophen concentrations >150 ug/mL at 4 hours after ingestion  and >50 ug/mL at 12 hours after ingestion are often associated with  toxic reactions.  Performed at [facility], [place] [address], [place], [place] [id number]    Salicylate Lvl 0000000 <7.0 (L)  7.0 - 30.0 mg/dL Final   Performed at [place] [address], [place], [place] [address]   Alcohol, Ethyl (B) 09/24/2021 <10  <10 mg/dL Final   Comment: (NOTE) Lowest detectable limit for serum alcohol is 10 mg/dL.  For medical purposes only. Performed at [facility], [place] [address], [place], [place] [address]    SARS Coronavirus 2 by RT PCR 09/24/2021 NEGATIVE  NEGATIVE Final   Comment: (NOTE) SARS-CoV-2 target nucleic acids are NOT DETECTED.  The SARS-CoV-2 RNA is  generally detectable in upper respiratory specimens during the acute phase of infection. The lowest concentration of SARS-CoV-2 viral copies this assay can detect is 138 copies/mL. A negative result does not preclude SARS-Cov-2 infection and should not be used as the sole basis for treatment or other patient management decisions. A negative result may occur with  improper specimen collection/handling, submission of specimen other than nasopharyngeal swab, presence of viral mutation(s) within the areas targeted by this assay, and inadequate number of viral copies(<138 copies/mL). A negative result must be combined with clinical observations, patient history, and epidemiological information. The expected result is Negative.  Fact Sheet for Patients:  EntrepreneurPulse.com.au  Fact Sheet for Healthcare Providers:  IncredibleEmployment.be  This test is no                          t yet approved or cleared by the [place] FDA and  has been authorized for detection and/or diagnosis of SARS-CoV-2 by FDA under an Emergency Use Authorization (EUA). This EUA will remain  in effect (meaning this test can be used) for the duration of the COVID-19 declaration under Section 564(b)(1) of the Act, 21 U.S.C.section 360bbb-3(b)(1), unless the authorization is terminated  or revoked sooner.       Influenza A by PCR 09/24/2021 NEGATIVE  NEGATIVE Final   Influenza B by PCR 09/24/2021 NEGATIVE  NEGATIVE Final   Comment: (NOTE) The Xpert Xpress SARS-CoV-2/FLU/RSV plus assay is intended as an aid in the diagnosis of influenza from Nasopharyngeal swab specimens and should not be used as a sole basis for treatment. Nasal washings and aspirates are unacceptable for Xpert Xpress SARS-CoV-2/FLU/RSV testing.  Fact Sheet for Patients: EntrepreneurPulse.com.au  Fact Sheet for Healthcare Providers: IncredibleEmployment.be  This  test is not yet approved or cleared by the [place] FDA and has been authorized for detection and/or diagnosis of SARS-CoV-2 by FDA under an Emergency Use Authorization (EUA). This EUA will remain in effect (meaning this test can be used) for the duration of the COVID-19 declaration under Section 564(b)(1)  of the Act, 21 U.S.C. section 360bbb-3(b)(1), unless the authorization is terminated or revoked.  Performed at [facility], [place] [address] [place], [place] [address]    Color, Urine 09/24/2021 YELLOW  YELLOW Final   APPearance 09/24/2021 CLEAR  CLEAR Final   Specific Gravity, Urine 09/24/2021 1.028  1.005 - 1.030 Final   pH 09/24/2021 5.0  5.0 - 8.0 Final   Glucose, UA 09/24/2021 >=500 (A)  NEGATIVE mg/dL Final   Hgb urine dipstick 09/24/2021 NEGATIVE  NEGATIVE Final   Bilirubin Urine 09/24/2021 NEGATIVE  NEGATIVE Final   Ketones, ur 09/24/2021 5 (A)  NEGATIVE mg/dL Final   Protein, ur 09/24/2021 30 (A)  NEGATIVE mg/dL Final   Nitrite 09/24/2021 NEGATIVE  NEGATIVE Final   Leukocytes,Ua 09/24/2021 NEGATIVE  NEGATIVE Final   RBC / HPF 09/24/2021 0-5  0 - 5 RBC/hpf Final   WBC, UA 09/24/2021 0-5  0 - 5 WBC/hpf Final   Bacteria, UA 09/24/2021 NONE SEEN  NONE SEEN Final   Squamous Epithelial / LPF 09/24/2021 0-5  0 - 5 Final   Mucus 09/24/2021 PRESENT   Final   Hyaline Casts, UA 09/24/2021 PRESENT   Final   Performed at [facility], [place] [address], [place], [place] [address]   Sodium 09/24/2021 136  135 - 145 mmol/L Final   Potassium 09/24/2021 4.2  3.5 - 5.1 mmol/L Final   Comment: DELTA CHECK NOTED SPECIMEN HEMOLYZED. HEMOLYSIS MAY AFFECT INTEGRITY OF RESULTS.    Chloride 09/24/2021 102  98 - 111 mmol/L Final   CO2 09/24/2021 25  22 - 32 mmol/L Final   Glucose, Bld 09/24/2021 189 (H)  70 - 99 mg/dL Final   Glucose reference range applies only to samples taken after fasting for at least 8 hours.   BUN 09/24/2021 8  6 - 20 mg/dL Final    Creatinine, Ser 09/24/2021 0.87  0.44 - 1.00 mg/dL Final   Calcium 09/24/2021 9.0  8.9 - 10.3 mg/dL Final   GFR, Estimated 09/24/2021 >60  >60 mL/min Final   Comment: (NOTE) Calculated using the CKD-EPI Creatinine Equation (2021)    Anion gap 09/24/2021 9  5 - 15 Final   Performed at [facility], [place] [address] [place], [place] [address]  Admission on 09/02/2021, Discharged on 09/09/2021  Component Date Value Ref Range Status   WBC 09/03/2021 6.4  4.0 - 10.5 K/uL Final   RBC 09/03/2021 4.60  3.87 - 5.11 MIL/uL Final   Hemoglobin 09/03/2021 14.8  12.0 - 15.0 g/dL Final   HCT 09/03/2021 43.2  36.0 - 46.0 % Final   MCV 09/03/2021 93.9  80.0 - 100.0 fL Final   MCH 09/03/2021 32.2  26.0 - 34.0 pg Final   MCHC 09/03/2021 34.3  30.0 - 36.0 g/dL Final   RDW 09/03/2021 13.2  11.5 - 15.5 % Final   Platelets 09/03/2021 299  150 - 400 K/uL Final   nRBC 09/03/2021 0.0  0.0 - 0.2 % Final   Performed at [facility], [place] [address] [place], [place] [address]   Sodium 09/03/2021 134 (L)  135 - 145 mmol/L Final   Potassium 09/03/2021 3.8  3.5 - 5.1 mmol/L Final   Chloride 09/03/2021 103  98 - 111 mmol/L Final   CO2 09/03/2021 22  22 - 32 mmol/L Final   Glucose, Bld 09/03/2021 152 (H)  70 - 99 mg/dL Final   Glucose reference range applies only to samples taken after fasting for  at least 8 hours.   BUN 09/03/2021 6  6 - 20 mg/dL Final   Creatinine, Ser 09/03/2021 0.80  0.44 - 1.00 mg/dL Final   Calcium 09/03/2021 9.2  8.9 - 10.3 mg/dL Final   Total Protein 09/03/2021 7.5  6.5 - 8.1 g/dL Final   Albumin 09/03/2021 4.3  3.5 - 5.0 g/dL Final   AST 09/03/2021 43 (H)  15 - 41 U/L Final   ALT 09/03/2021 49 (H)  0 - 44 U/L Final   Alkaline Phosphatase 09/03/2021 72  38 - 126 U/L Final   Total Bilirubin 09/03/2021 0.8  0.3 - 1.2 mg/dL Final   GFR, Estimated 09/03/2021 >60  >60 mL/min Final   Comment: (NOTE) Calculated using the CKD-EPI Creatinine Equation  (2021)    Anion gap 09/03/2021 9  5 - 15 Final   Performed at [facility], [place] [address] [place], [place] [address]   Hgb A1c MFr Bld 09/03/2021 7.7 (H)  4.8 - 5.6 % Final   Comment: (NOTE) Pre diabetes:          5.7%-6.4%  Diabetes:              >6.4%  Glycemic control for   <7.0% adults with diabetes    Mean Plasma Glucose 09/03/2021 174.29  mg/dL Final   Performed at [facility], [place] [address], [place], [place] [address]   Cholesterol 09/03/2021 204 (H)  0 - 200 mg/dL Final   Triglycerides 09/03/2021 165 (H)  <150 mg/dL Final   HDL 09/03/2021 41  >40 mg/dL Final   Total CHOL/HDL Ratio 09/03/2021 5.0  RATIO Final   VLDL 09/03/2021 33  0 - 40 mg/dL Final   LDL Cholesterol 09/03/2021 130 (H)  0 - 99 mg/dL Final   Comment:        Total Cholesterol/HDL:CHD Risk Coronary Heart Disease Risk Table                     Men   Women  1/2 Average Risk   3.4   3.3  Average Risk       5.0   4.4  2 X Average Risk   9.6   7.1  3 X Average Risk  23.4   11.0        Use the calculated Patient Ratio above and the CHD Risk Table to determine the patient's CHD Risk.        ATP III CLASSIFICATION (LDL):  <100     mg/dL   Optimal  100-129  mg/dL   Near or Above                    Optimal  130-159  mg/dL   Borderline  160-189  mg/dL   High  >190     mg/dL   Very High Performed at [place] [address] [place], [place] [address]    TSH 09/03/2021 6.299 (H)  0.350 - 4.500 uIU/mL Final   Comment: Performed by a 3rd Generation assay with a functional sensitivity of <=0.01 uIU/mL. Performed at [facility], [place] [address], [place], [place] [address]    Glucose-Capillary 09/03/2021 160 (H)  70 - 99 mg/dL Final   Glucose reference range applies only to samples taken after fasting for at least 8 hours.   Comment 1 09/03/2021 Notify RN   Final  Admission on 09/01/2021, Discharged on 09/02/2021  Component Date Value Ref  Range  Status   SARS Coronavirus 2 by RT PCR 09/01/2021 NEGATIVE  NEGATIVE Final   Comment: (NOTE) SARS-CoV-2 target nucleic acids are NOT DETECTED.  The SARS-CoV-2 RNA is generally detectable in upper respiratory specimens during the acute phase of infection. The lowest concentration of SARS-CoV-2 viral copies this assay can detect is 138 copies/mL. A negative result does not preclude SARS-Cov-2 infection and should not be used as the sole basis for treatment or other patient management decisions. A negative result may occur with  improper specimen collection/handling, submission of specimen other than nasopharyngeal swab, presence of viral mutation(s) within the areas targeted by this assay, and inadequate number of viral copies(<138 copies/mL). A negative result must be combined with clinical observations, patient history, and epidemiological information. The expected result is Negative.  Fact Sheet for Patients:  EntrepreneurPulse.com.au  Fact Sheet for Healthcare Providers:  IncredibleEmployment.be  This test is no                          t yet approved or cleared by the [place] FDA and  has been authorized for detection and/or diagnosis of SARS-CoV-2 by FDA under an Emergency Use Authorization (EUA). This EUA will remain  in effect (meaning this test can be used) for the duration of the COVID-19 declaration under Section 564(b)(1) of the Act, 21 U.S.C.section 360bbb-3(b)(1), unless the authorization is terminated  or revoked sooner.       Influenza A by PCR 09/01/2021 NEGATIVE  NEGATIVE Final   Influenza B by PCR 09/01/2021 NEGATIVE  NEGATIVE Final   Comment: (NOTE) The Xpert Xpress SARS-CoV-2/FLU/RSV plus assay is intended as an aid in the diagnosis of influenza from Nasopharyngeal swab specimens and should not be used as a sole basis for treatment. Nasal washings and aspirates are unacceptable for Xpert Xpress  SARS-CoV-2/FLU/RSV testing.  Fact Sheet for Patients: EntrepreneurPulse.com.au  Fact Sheet for Healthcare Providers: IncredibleEmployment.be  This test is not yet approved or cleared by the [place] FDA and has been authorized for detection and/or diagnosis of SARS-CoV-2 by FDA under an Emergency Use Authorization (EUA). This EUA will remain in effect (meaning this test can be used) for the duration of the COVID-19 declaration under Section 564(b)(1) of the Act, 21 U.S.C. section 360bbb-3(b)(1), unless the authorization is terminated or revoked.  Performed at [facility], [place] [address] [place], [place] [address]    Sodium 09/01/2021 134 (L)  135 - 145 mmol/L Final   Potassium 09/01/2021 3.0 (L)  3.5 - 5.1 mmol/L Final   Chloride 09/01/2021 101  98 - 111 mmol/L Final   CO2 09/01/2021 18 (L)  22 - 32 mmol/L Final   Glucose, Bld 09/01/2021 328 (H)  70 - 99 mg/dL Final   Glucose reference range applies only to samples taken after fasting for at least 8 hours.   BUN 09/01/2021 14  6 - 20 mg/dL Final   Creatinine, Ser 09/01/2021 0.90  0.44 - 1.00 mg/dL Final   Calcium 09/01/2021 9.2  8.9 - 10.3 mg/dL Final   Total Protein 09/01/2021 7.6  6.5 - 8.1 g/dL Final   Albumin 09/01/2021 4.4  3.5 - 5.0 g/dL Final   AST 09/01/2021 55 (H)  15 - 41 U/L Final   ALT 09/01/2021 54 (H)  0 - 44 U/L Final   Alkaline Phosphatase 09/01/2021 76  38 - 126 U/L Final   Total Bilirubin 09/01/2021 1.1  0.3 - 1.2 mg/dL Final   GFR,  Estimated 09/01/2021 >60  >60 mL/min Final   Comment: (NOTE) Calculated using the CKD-EPI Creatinine Equation (2021)    Anion gap 09/01/2021 15  5 - 15 Final   Performed at [facility], [place] [address], [place], [place] [address]   Alcohol, Ethyl (B) 09/01/2021 <10  <10 mg/dL Final   Comment: (NOTE) Lowest detectable limit for serum alcohol is 10 mg/dL.  For medical purposes only. Performed at  [facility], [place] [address], [place], [place] [address]    Opiates 09/01/2021 NONE DETECTED  NONE DETECTED Final   Cocaine 09/01/2021 NONE DETECTED  NONE DETECTED Final   Benzodiazepines 09/01/2021 NONE DETECTED  NONE DETECTED Final   Amphetamines 09/01/2021 NONE DETECTED  NONE DETECTED Final   Tetrahydrocannabinol 09/01/2021 POSITIVE (A)  NONE DETECTED Final   Barbiturates 09/01/2021 NONE DETECTED  NONE DETECTED Final   Comment: (NOTE) DRUG SCREEN FOR MEDICAL PURPOSES ONLY.  IF CONFIRMATION IS NEEDED FOR ANY PURPOSE, NOTIFY LAB WITHIN 5 DAYS.  LOWEST DETECTABLE LIMITS FOR URINE DRUG SCREEN Drug Class                     Cutoff (ng/mL) Amphetamine and metabolites    1000 Barbiturate and metabolites    200 Benzodiazepine                 A999333 Tricyclics and metabolites     300 Opiates and metabolites        300 Cocaine and metabolites        300 THC                            50 Performed at [facility], [place] [address] [place], [place] [address]    WBC 09/01/2021 6.7  4.0 - 10.5 K/uL Final   RBC 09/01/2021 4.57  3.87 - 5.11 MIL/uL Final   Hemoglobin 09/01/2021 14.5  12.0 - 15.0 g/dL Final   HCT 09/01/2021 41.0  36.0 - 46.0 % Final   MCV 09/01/2021 89.7  80.0 - 100.0 fL Final   MCH 09/01/2021 31.7  26.0 - 34.0 pg Final   MCHC 09/01/2021 35.4  30.0 - 36.0 g/dL Final   RDW 09/01/2021 12.7  11.5 - 15.5 % Final   Platelets 09/01/2021 276  150 - 400 K/uL Final   nRBC 09/01/2021 0.0  0.0 - 0.2 % Final   Neutrophils Relative % 09/01/2021 67  % Final   Neutro Abs 09/01/2021 4.5  1.7 - 7.7 K/uL Final   Lymphocytes Relative 09/01/2021 24  % Final   Lymphs Abs 09/01/2021 1.6  0.7 - 4.0 K/uL Final   Monocytes Relative 09/01/2021 7  % Final   Monocytes Absolute 09/01/2021 0.5  0.1 - 1.0 K/uL Final   Eosinophils Relative 09/01/2021 2  % Final   Eosinophils Absolute 09/01/2021 0.1  0.0 - 0.5 K/uL Final   Basophils Relative 09/01/2021 0  % Final    Basophils Absolute 09/01/2021 0.0  0.0 - 0.1 K/uL Final   Immature Granulocytes 09/01/2021 0  % Final   Abs Immature Granulocytes 09/01/2021 0.01  0.00 - 0.07 K/uL Final   Performed at [facility], [place] [address], [place],  [address]   I-stat hCG, quantitative 09/01/2021 <5.0  <5 mIU/mL Final   Comment 3 09/01/2021          Final   Comment:   GEST. AGE  CONC.  (mIU/mL)   <=1 WEEK        5 - 50     2 WEEKS       50 - 500     3 WEEKS       100 - 10,000     4 WEEKS     1,000 - 30,000        FEMALE AND NON-PREGNANT FEMALE:     LESS THAN 5 mIU/mL    Magnesium 09/01/2021 1.8  1.7 - 2.4 mg/dL Final   Performed at [facility], [place] [address] [place], [place] [address]   Acetaminophen (Tylenol), Serum 09/01/2021 <10 (L)  10 - 30 ug/mL Final   Comment: (NOTE) Therapeutic concentrations vary significantly. A range of 10-30 ug/mL  may be an effective concentration for many patients. However, some  are best treated at concentrations outside of this range. Acetaminophen concentrations >150 ug/mL at 4 hours after ingestion  and >50 ug/mL at 12 hours after ingestion are often associated with  toxic reactions.  Performed at [facility], [place] [address], [place], [place] [id number]    Salicylate Lvl [id number] <7.0 (L)  7.0 - 30.0 mg/dL Final   Performed at [place] [address] [place], [place] [address]   Acetaminophen (Tylenol), Serum 09/01/2021 <10 (L)  10 - 30 ug/mL Final   Comment: (NOTE) Therapeutic concentrations vary significantly. A range of 10-30 ug/mL  may be an effective concentration for many patients. However, some  are best treated at concentrations outside of this range. Acetaminophen concentrations >150 ug/mL at 4 hours after ingestion  and >50 ug/mL at 12 hours after ingestion are often associated with  toxic reactions.  Performed at [facility], [place] [address], [place], [place] [address]    FIO2 09/01/2021 21.00   Final   pH, Ven 09/01/2021 7.423  7.250 - 7.430 Final   pCO2, Ven 09/01/2021 33.8 (L)  44.0 - 60.0 mmHg Final   pO2, Ven 09/01/2021 56.7 (H)  32.0 - 45.0 mmHg Final   Bicarbonate 09/01/2021 21.7  20.0 - 28.0 mmol/L Final   Acid-base deficit 09/01/2021 1.6  0.0 - 2.0 mmol/L Final   O2 Saturation 09/01/2021 89.4  % Final   Patient temperature 09/01/2021 98.6   Final   Performed at [facility], [place] [address], [place], [place] [address]   Color, Urine 09/01/2021 STRAW (A)  YELLOW Final   APPearance 09/01/2021 CLEAR  CLEAR Final   Specific Gravity, Urine 09/01/2021 1.005  1.005 - 1.030 Final   pH 09/01/2021 7.0  5.0 - 8.0 Final   Glucose, UA 09/01/2021 >=500 (A)  NEGATIVE mg/dL Final   Hgb urine dipstick 09/01/2021 NEGATIVE  NEGATIVE Final   Bilirubin Urine 09/01/2021 NEGATIVE  NEGATIVE Final   Ketones, ur 09/01/2021 20 (A)  NEGATIVE mg/dL Final   Protein, ur 09/01/2021 NEGATIVE  NEGATIVE mg/dL Final   Nitrite 09/01/2021 NEGATIVE  NEGATIVE Final   Leukocytes,Ua 09/01/2021 NEGATIVE  NEGATIVE Final   WBC, UA 09/01/2021 0-5  0 - 5 WBC/hpf Final   Bacteria, UA 09/01/2021 RARE (A)  NONE SEEN Final   Squamous Epithelial / LPF 09/01/2021 0-5  0 - 5 Final   Performed at [facility], [place] [address], [place], [place] [address]   MRSA by PCR Next Gen 09/01/2021 NOT DETECTED  NOT DETECTED Final   Comment: (NOTE) The GeneXpert MRSA Assay (FDA approved for NASAL specimens only), is one  component of a comprehensive MRSA colonization surveillance program. It is not intended to diagnose MRSA infection nor to guide or monitor treatment for MRSA infections. Test performance is not FDA approved in patients less than 102 years old. Performed at [facility], [place] [address], [place], [place] [address]    HIV Screen 4th Generation wRfx 09/02/2021 Non Reactive  Non Reactive Final   Performed at  [facility], [place] [address] [place], [place] [address]   Sodium 09/02/2021 137  135 - 145 mmol/L Final   Potassium 09/02/2021 4.0  3.5 - 5.1 mmol/L Final   DELTA CHECK NOTED   Chloride 09/02/2021 111  98 - 111 mmol/L Final   CO2 09/02/2021 23  22 - 32 mmol/L Final   Glucose, Bld 09/02/2021 124 (H)  70 - 99 mg/dL Final   Glucose reference range applies only to samples taken after fasting for at least 8 hours.   BUN 09/02/2021 7  6 - 20 mg/dL Final   Creatinine, Ser 09/02/2021 0.72  0.44 - 1.00 mg/dL Final   Calcium 09/02/2021 8.0 (L)  8.9 - 10.3 mg/dL Final   Total Protein 09/02/2021 5.7 (L)  6.5 - 8.1 g/dL Final   Albumin 09/02/2021 3.3 (L)  3.5 - 5.0 g/dL Final   AST 09/02/2021 33  15 - 41 U/L Final   ALT 09/02/2021 40  0 - 44 U/L Final   Alkaline Phosphatase 09/02/2021 58  38 - 126 U/L Final   Total Bilirubin 09/02/2021 0.8  0.3 - 1.2 mg/dL Final   GFR, Estimated 09/02/2021 >60  >60 mL/min Final   Comment: (NOTE) Calculated using the CKD-EPI Creatinine Equation (2021)    Anion gap 09/02/2021 3 (L)  5 - 15 Final   Performed at [facility], [place] [address] [place], [place] [address]   WBC 09/02/2021 5.2  4.0 - 10.5 K/uL Final   RBC 09/02/2021 3.68 (L)  3.87 - 5.11 MIL/uL Final   Hemoglobin 09/02/2021 11.6 (L)  12.0 - 15.0 g/dL Final   HCT 09/02/2021 34.9 (L)  36.0 - 46.0 % Final   MCV 09/02/2021 94.8  80.0 - 100.0 fL Final   MCH 09/02/2021 31.5  26.0 - 34.0 pg Final   MCHC 09/02/2021 33.2  30.0 - 36.0 g/dL Final   RDW 09/02/2021 13.1  11.5 - 15.5 % Final   Platelets 09/02/2021 184  150 - 400 K/uL Final   nRBC 09/02/2021 0.0  0.0 - 0.2 % Final   Performed at [facility], [place] [address], [place], [place] [address]   Glucose-Capillary 09/01/2021 176 (H)  70 - 99 mg/dL Final   Glucose reference range applies only to samples taken after fasting for at least 8 hours.   Comment 1 09/01/2021 Notify RN   Final   Comment 2 09/01/2021 Document  in Chart   Final   Glucose-Capillary 09/01/2021 143 (H)  70 - 99 mg/dL Final   Glucose reference range applies only to samples taken after fasting for at least 8 hours.   Glucose-Capillary 09/02/2021 147 (H)  70 - 99 mg/dL Final   Glucose reference range applies only to samples taken after fasting for at least 8 hours.   Glucose-Capillary 09/02/2021 126 (H)  70 - 99 mg/dL Final   Glucose reference range applies only to samples taken after fasting for at least 8 hours.   Comment 1 09/02/2021 Notify RN   Final   Comment 2 09/02/2021 Document in Chart  Final    Allergies: Azithromycin  PTA Medications: (Not in a hospital admission)   Medical Decision Making  Patient will be admitted to [facility] for crisis stabilization and safety    Recommendations  Based on my evaluation the patient does not appear to have an emergency medical condition.  Ophelia Shoulder, NP 12/26/21  9:39 PM

## 2021-12-27 LAB — GLUCOSE, CAPILLARY
Glucose-Capillary: 250 mg/dL — ABNORMAL HIGH (ref 70–99)
Glucose-Capillary: 309 mg/dL — ABNORMAL HIGH (ref 70–99)
Glucose-Capillary: 397 mg/dL — ABNORMAL HIGH (ref 70–99)

## 2021-12-27 MED ORDER — INSULIN ASPART 100 UNIT/ML IJ SOLN
0.0000 [IU] | Freq: Three times a day (TID) | INTRAMUSCULAR | Status: DC
  Administered 2021-12-27: 11 [IU] via SUBCUTANEOUS
  Administered 2021-12-27: 15 [IU] via SUBCUTANEOUS
  Administered 2021-12-27 – 2021-12-28 (×2): 5 [IU] via SUBCUTANEOUS
  Administered 2021-12-28: 3 [IU] via SUBCUTANEOUS
  Administered 2021-12-28: 8 [IU] via SUBCUTANEOUS

## 2021-12-27 MED ORDER — INSULIN ASPART 100 UNIT/ML IJ SOLN
0.0000 [IU] | Freq: Every day | INTRAMUSCULAR | Status: DC
  Administered 2021-12-27: 2 [IU] via SUBCUTANEOUS
  Administered 2021-12-28: 3 [IU] via SUBCUTANEOUS

## 2021-12-27 NOTE — Progress Notes (Signed)
Pt is asleep. Respirations are even and unlabored. No distress noted. Staff will monitor pt's safety.

## 2021-12-27 NOTE — Progress Notes (Addendum)
Pt is awake, alert and oriented. Pt complained of general body. Pt declined PRN Tylenol when offered. No distress noted. Administered insulin with no incident. Pt endorses SI with plan to run into traffic. Pt verbally contracts for safety on the unit. Pt denies current HI/AVH. Staff will monitor for pt's safety.

## 2021-12-27 NOTE — ED Notes (Signed)
Pt is in the bed sleeping. Respirations are even and unlabored. No acute distress noted. Will continue to monitor for safety. 

## 2021-12-27 NOTE — Progress Notes (Signed)
Pt had dinner and is resting quietly. No distress noted. Staff will monitor for pt's safety.

## 2021-12-27 NOTE — ED Provider Notes (Signed)
Behavioral Health Progress Note  Date and Time: 12/27/2021 12:26 PM Name: Erika Rhodes MRN:  [id number]  Subjective:  Erika Rhodes is a 41 year old female with psychiatric history of depression, psychosis, and PTSD. Patient presented voluntarily to [facility] for walking assessment. Patient presented with chief complaint of worsening depression and suicidal ideation with plan to walk into traffic.   Patient seen and evaluated face-to-face by this provider, and chart reviewed. On evaluation, patient is alert and oriented x4.  Her thought process is logical and speech is clear and coherent.  Patient endorses suicidal ideations with a plan to "put myself in front of a car and get hit."  Patient denies homicidal ideations. Patient denies auditory and visual hallucinations. Patient reports depressive symptoms of hopelessness, desperation, and worthlessness. Patient endorses anxiety symptoms and describes it as feeling like her chest is exploding, heart racing, and racing thoughts.Patient reports fair sleep. Patient reports fair appetite.   I discussed with the patient restarting Prolixin which is a previous home medication that the patient was started on while at behavioral health in November 2022. Patient states that she does not want to be restarted on Prolixin and was only prescribed prolixin for a couple days. She states that she is stopped taking Prolixin because she did not like it. I discussed with the patient starting an antidepressant for depression. Patient states that she does not do well on SSRIs and has been off of them for a year. She states that she is not interested in medications and is interested in therapy.   Patient complains of congestion and cough. She asked if her albuterol inhaler can be ordered.  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 [place] hospitalization 10/02/21:Prescribed Prolixn 5 mg TID. [place] hospitalization 09/02/21: Cymbalta 30 mg daily [facility] hospitalization 05/30/2021: Seroquel 400 mg QHS and Cymbalta 30 mg daily  Past Medical History:  Past Medical History:  Diagnosis Date   Allergic reaction    Anxiety    Asthma    Depression    DM (diabetes mellitus), gestational    Migraines    Rheumatoid arthritis ([place]) 2016   Diagnosed Dr.  Lamarr Lulas; has been treated with MTX, Humira, etc, but made her sick.  Was taking CBD oil and stopped as tested positive in a drug screen for work.    Past Surgical History:  Procedure Laterality Date   ABDOMINAL HYSTERECTOMY  2017   Fibroids; Both ovaries intact still   BACK SURGERY     CESAREAN SECTION  2009   CHOLECYSTECTOMY  2010   laparoscopic   TONSILLECTOMY     Family History:  Family History  Problem Relation Age of Onset   Arthritis Mother        Rheumatoid   Depression Daughter        and anxiety   Allergies Son    Family Psychiatric  History: No hx reported  Social History:  Social History   Substance and Sexual Activity  Alcohol Use Yes   Alcohol/week: 2.0 standard drinks   Types: 2 Glasses of wine per week   Comment: occ     Social History   Substance and Sexual Activity  Drug Use Yes   Types: Marijuana   Comment: "cannabis delta 10"    Social History   Socioeconomic History  Marital status: Single    Spouse name: Not on file   Number of children: 5   Years of education: 14   Highest education level: Associate degree: academic program  Occupational History   Not on file  Tobacco Use   Smoking status: Never   Smokeless tobacco: Never  Vaping Use   Vaping Use: Some days  Substance and Sexual Activity   Alcohol use: Yes    Alcohol/week: 2.0 standard drinks    Types: 2 Glasses of wine per week    Comment: occ   Drug use:  Yes    Types: Marijuana    Comment: "cannabis delta 10"   Sexual activity: Yes    Birth control/protection: Surgical  Other Topics Concern   Not on file  Social History Narrative   Lives with current boyfriend   See history of present illness for more history 04/22/2020   Social Determinants of Health   Financial Resource Strain: Not on file  Food Insecurity: Not on file  Transportation Needs: Not on file  Physical Activity: Not on file  Stress: Not on file  Social Connections: Not on file   SDOH:  SDOH Screenings   Alcohol Screen: Low Risk    Last Alcohol Screening Score (AUDIT): 2  Depression (PHQ2-9): Low Risk    PHQ-2 Score: 0  Financial Resource Strain: Not on file  Food Insecurity: Not on file  Housing: Not on file  Physical Activity: Not on file  Social Connections: Not on file  Stress: Not on file  Tobacco Use: Low Risk    Smoking Tobacco Use: Never   Smokeless Tobacco Use: Never   Passive Exposure: Not on file  Transportation Needs: Not on file   Additional Social History:     Current Medications:  Current Facility-Administered Medications  Medication Dose Route Frequency Provider Last Rate Last Admin   acetaminophen (TYLENOL) tablet 650 mg  650 mg Oral Q6H PRN Ajibola, Ene A, NP       alum & mag hydroxide-simeth (MAALOX/MYLANTA) 200-200-20 MG/5ML suspension 30 mL  30 mL Oral Q4H PRN Ajibola, Ene A, NP       hydrOXYzine (ATARAX) tablet 25 mg  25 mg Oral TID PRN Ajibola, Ene A, NP       insulin aspart (novoLOG) injection 0-15 Units  0-15 Units Subcutaneous TID WC Ajibola, Ene A, NP   11 Units at 12/27/21 1214   insulin aspart (novoLOG) injection 0-5 Units  0-5 Units Subcutaneous QHS Ajibola, Ene A, NP       magnesium hydroxide (MILK OF MAGNESIA) suspension 30 mL  30 mL Oral Daily PRN Ajibola, Ene A, NP       traZODone (DESYREL) tablet 50 mg  50 mg Oral QHS PRN Ajibola, Ene A, NP   50 mg at 12/27/21 0023   Current Outpatient Medications  Medication Sig  Dispense Refill   albuterol (VENTOLIN HFA) 108 (90 Base) MCG/ACT inhaler Inhale 1-2 puffs into the lungs every 4 (four) hours as needed for wheezing or shortness of breath. 1 each 0   melatonin 3 MG TABS tablet Take 3 mg by mouth at bedtime.     montelukast (SINGULAIR) 10 MG tablet Take 10 mg by mouth at bedtime.     omeprazole (PRILOSEC) 20 MG capsule Take 20 mg by mouth daily.      Labs  Lab Results:  Admission on 12/26/2021  Component Date Value Ref Range Status   SARS Coronavirus 2 by RT PCR 12/26/2021 NEGATIVE  NEGATIVE  Final   Comment: (NOTE) SARS-CoV-2 target nucleic acids are NOT DETECTED.  The SARS-CoV-2 RNA is generally detectable in upper respiratory specimens during the acute phase of infection. The lowest concentration of SARS-CoV-2 viral copies this assay can detect is 138 copies/mL. A negative result does not preclude SARS-Cov-2 infection and should not be used as the sole basis for treatment or other patient management decisions. A negative result may occur with  improper specimen collection/handling, submission of specimen other than nasopharyngeal swab, presence of viral mutation(s) within the areas targeted by this assay, and inadequate number of viral copies(<138 copies/mL). A negative result must be combined with clinical observations, patient history, and epidemiological information. The expected result is Negative.  Fact Sheet for Patients:  EntrepreneurPulse.com.au  Fact Sheet for Healthcare Providers:  IncredibleEmployment.be  This test is no                          t yet approved or cleared by the [place] FDA and  has been authorized for detection and/or diagnosis of SARS-CoV-2 by FDA under an Emergency Use Authorization (EUA). This EUA will remain  in effect (meaning this test can be used) for the duration of the COVID-19 declaration under Section 564(b)(1) of the Act, 21 U.S.C.section 360bbb-3(b)(1), unless  the authorization is terminated  or revoked sooner.       Influenza A by PCR 12/26/2021 NEGATIVE  NEGATIVE Final   Influenza B by PCR 12/26/2021 NEGATIVE  NEGATIVE Final   Comment: (NOTE) The Xpert Xpress SARS-CoV-2/FLU/RSV plus assay is intended as an aid in the diagnosis of influenza from Nasopharyngeal swab specimens and should not be used as a sole basis for treatment. Nasal washings and aspirates are unacceptable for Xpert Xpress SARS-CoV-2/FLU/RSV testing.  Fact Sheet for Patients: EntrepreneurPulse.com.au  Fact Sheet for Healthcare Providers: IncredibleEmployment.be  This test is not yet approved or cleared by the [place] FDA and has been authorized for detection and/or diagnosis of SARS-CoV-2 by FDA under an Emergency Use Authorization (EUA). This EUA will remain in effect (meaning this test can be used) for the duration of the COVID-19 declaration under Section 564(b)(1) of the Act, 21 U.S.C. section 360bbb-3(b)(1), unless the authorization is terminated or revoked.  Performed at [facility], [place] [address], [place], [place] [address]    WBC 12/26/2021 10.9 (H)  4.0 - 10.5 K/uL Final   RBC 12/26/2021 4.89  3.87 - 5.11 MIL/uL Final   Hemoglobin 12/26/2021 15.3 (H)  12.0 - 15.0 g/dL Final   HCT 12/26/2021 42.2  36.0 - 46.0 % Final   MCV 12/26/2021 86.3  80.0 - 100.0 fL Final   MCH 12/26/2021 31.3  26.0 - 34.0 pg Final   MCHC 12/26/2021 36.3 (H)  30.0 - 36.0 g/dL Final   RDW 12/26/2021 12.4  11.5 - 15.5 % Final   Platelets 12/26/2021 327  150 - 400 K/uL Final   nRBC 12/26/2021 0.0  0.0 - 0.2 % Final   Neutrophils Relative % 12/26/2021 68  % Final   Neutro Abs 12/26/2021 7.4  1.7 - 7.7 K/uL Final   Lymphocytes Relative 12/26/2021 26  % Final   Lymphs Abs 12/26/2021 2.9  0.7 - 4.0 K/uL Final   Monocytes Relative 12/26/2021 5  % Final   Monocytes Absolute 12/26/2021 0.6  0.1 - 1.0 K/uL Final   Eosinophils Relative  12/26/2021 1  % Final   Eosinophils Absolute 12/26/2021 0.1  0.0 - 0.5 K/uL  Final   Basophils Relative 12/26/2021 0  % Final   Basophils Absolute 12/26/2021 0.0  0.0 - 0.1 K/uL Final   Immature Granulocytes 12/26/2021 0  % Final   Abs Immature Granulocytes 12/26/2021 0.02  0.00 - 0.07 K/uL Final   Performed at [facility], [place] [address] [place], [place] [address]   Sodium 12/26/2021 132 (L)  135 - 145 mmol/L Final   Potassium 12/26/2021 3.4 (L)  3.5 - 5.1 mmol/L Final   Chloride 12/26/2021 98  98 - 111 mmol/L Final   CO2 12/26/2021 22  22 - 32 mmol/L Final   Glucose, Bld 12/26/2021 337 (H)  70 - 99 mg/dL Final   Glucose reference range applies only to samples taken after fasting for at least 8 hours.   BUN 12/26/2021 7  6 - 20 mg/dL Final   Creatinine, Ser 12/26/2021 0.74  0.44 - 1.00 mg/dL Final   Calcium 12/26/2021 9.2  8.9 - 10.3 mg/dL Final   Total Protein 12/26/2021 7.8  6.5 - 8.1 g/dL Final   Albumin 12/26/2021 4.2  3.5 - 5.0 g/dL Final   AST 12/26/2021 24  15 - 41 U/L Final   ALT 12/26/2021 32  0 - 44 U/L Final   Alkaline Phosphatase 12/26/2021 82  38 - 126 U/L Final   Total Bilirubin 12/26/2021 0.7  0.3 - 1.2 mg/dL Final   GFR, Estimated 12/26/2021 >60  >60 mL/min Final   Comment: (NOTE) Calculated using the CKD-EPI Creatinine Equation (2021)    Anion gap 12/26/2021 12  5 - 15 Final   Performed at [facility], [place] [address] [place], [place] [address]   Cholesterol 12/26/2021 213 (H)  0 - 200 mg/dL Final   Triglycerides 12/26/2021 185 (H)  <150 mg/dL Final   HDL 12/26/2021 50  >40 mg/dL Final   Total CHOL/HDL Ratio 12/26/2021 4.3  RATIO Final   VLDL 12/26/2021 37  0 - 40 mg/dL Final   LDL Cholesterol 12/26/2021 126 (H)  0 - 99 mg/dL Final   Comment:        Total Cholesterol/HDL:CHD Risk Coronary Heart Disease Risk Table                     Men   Women  1/2 Average Risk   3.4   3.3  Average Risk       5.0   4.4  2 X Average Risk   9.6   7.1  3 X  Average Risk  23.4   11.0        Use the calculated Patient Ratio above and the CHD Risk Table to determine the patient's CHD Risk.        ATP III CLASSIFICATION (LDL):  <100     mg/dL   Optimal  100-129  mg/dL   Near or Above                    Optimal  130-159  mg/dL   Borderline  160-189  mg/dL   High  >190     mg/dL   Very High Performed at [place] [address], [place], [place] [address]    TSH 12/26/2021 7.098 (H)  0.350 - 4.500 uIU/mL Final   Comment: Performed by a 3rd Generation assay with a functional sensitivity of <=0.01 uIU/mL. Performed at [facility], [place] [address] [place], [place] [address]    POC Amphetamine UR 12/26/2021 None  Detected  NONE DETECTED (Cut Off Level 1000 ng/mL) Final   POC Secobarbital (BAR) 12/26/2021 None Detected  NONE DETECTED (Cut Off Level 300 ng/mL) Final   POC Buprenorphine (BUP) 12/26/2021 None Detected  NONE DETECTED (Cut Off Level 10 ng/mL) Final   POC Oxazepam (BZO) 12/26/2021 None Detected  NONE DETECTED (Cut Off Level 300 ng/mL) Final   POC Cocaine UR 12/26/2021 None Detected  NONE DETECTED (Cut Off Level 300 ng/mL) Final   POC Methamphetamine UR 12/26/2021 None Detected  NONE DETECTED (Cut Off Level 1000 ng/mL) Final   POC Morphine 12/26/2021 None Detected  NONE DETECTED (Cut Off Level 300 ng/mL) Final   POC Oxycodone UR 12/26/2021 None Detected  NONE DETECTED (Cut Off Level 100 ng/mL) Final   POC Methadone UR 12/26/2021 None Detected  NONE DETECTED (Cut Off Level 300 ng/mL) Final   POC Marijuana UR 12/26/2021 None Detected  NONE DETECTED (Cut Off Level 50 ng/mL) Final   SARS Coronavirus 2 Ag 12/26/2021 Negative  Negative Preliminary   Preg Test, Ur 12/26/2021 NEGATIVE  NEGATIVE Final   Comment:        THE SENSITIVITY OF THIS METHODOLOGY IS >24 mIU/mL    SARSCOV2ONAVIRUS 2 AG 12/26/2021 NEGATIVE  NEGATIVE Final   Comment: (NOTE) SARS-CoV-2 antigen NOT DETECTED.   Negative results are presumptive.  Negative  results do not preclude SARS-CoV-2 infection and should not be used as the sole basis for treatment or other patient management decisions, including infection  control decisions, particularly in the presence of clinical signs and  symptoms consistent with COVID-19, or in those who have been in contact with the virus.  Negative results must be combined with clinical observations, patient history, and epidemiological information. The expected result is Negative.  Fact Sheet for Patients: HandmadeRecipes.com.cy  Fact Sheet for Healthcare Providers: FuneralLife.at  This test is not yet approved or cleared by the [place] FDA and  has been authorized for detection and/or diagnosis of SARS-CoV-2 by FDA under an Emergency Use Authorization (EUA).  This EUA will remain in effect (meaning this test can be used) for the duration of  the COV                          ID-19 declaration under Section 564(b)(1) of the Act, 21 U.S.C. section 360bbb-3(b)(1), unless the authorization is terminated or revoked sooner.     Glucose-Capillary 12/27/2021 250 (H)  70 - 99 mg/dL Final   Glucose reference range applies only to samples taken after fasting for at least 8 hours.   Glucose-Capillary 12/27/2021 309 (H)  70 - 99 mg/dL Final   Glucose reference range applies only to samples taken after fasting for at least 8 hours.  Admission on 12/24/2021, Discharged on 12/25/2021  Component Date Value Ref Range Status   Sodium 12/24/2021 134 (L)  135 - 145 mmol/L Final   Potassium 12/24/2021 2.7 (LL)  3.5 - 5.1 mmol/L Final   Comment: CRITICAL RESULT CALLED TO, READ BACK BY AND VERIFIED WITH:  KADY LIVINGSTON 12/24/21 @ 0124 VS    Chloride 12/24/2021 101  98 - 111 mmol/L Final   CO2 12/24/2021 24  22 - 32 mmol/L Final   Glucose, Bld 12/24/2021 259 (H)  70 - 99 mg/dL Final   Glucose reference range applies only to samples taken after fasting for at least 8  hours.   BUN 12/24/2021 11  6 - 20 mg/dL Final   Creatinine, Ser 12/24/2021 0.67  0.44 - 1.00 mg/dL Final   Calcium 16/08/9603 9.0  8.9 - 10.3 mg/dL Final   Total Protein 54/07/8118 7.0  6.5 - 8.1 g/dL Final   Albumin 14/78/2956 4.0  3.5 - 5.0 g/dL Final   AST 21/30/8657 25  15 - 41 U/L Final   ALT 12/24/2021 29  0 - 44 U/L Final   Alkaline Phosphatase 12/24/2021 73  38 - 126 U/L Final   Total Bilirubin 12/24/2021 0.8  0.3 - 1.2 mg/dL Final   GFR, Estimated 12/24/2021 >60  >60 mL/min Final   Comment: (NOTE) Calculated using the CKD-EPI Creatinine Equation (2021)    Anion gap 12/24/2021 9  5 - 15 Final   Performed at [facility], [address], [place], [place] [address]   Alcohol, Ethyl (B) 12/24/2021 <10  <10 mg/dL Final   Comment: (NOTE) Lowest detectable limit for serum alcohol is 10 mg/dL.  For medical purposes only. Performed at [facility], [address], [place], [place] [address]    Salicylate Lvl 12/24/2021 <7.0 (L)  7.0 - 30.0 mg/dL Final   Performed at [facility], [address] [place], [place] [address]   Acetaminophen (Tylenol), Serum 12/24/2021 <10 (L)  10 - 30 ug/mL Final   Comment: (NOTE) Therapeutic concentrations vary significantly. A range of 10-30 ug/mL  may be an effective concentration for many patients. However, some  are best treated at concentrations outside of this range. Acetaminophen concentrations >150 ug/mL at 4 hours after ingestion  and >50 ug/mL at 12 hours after ingestion are often associated with  toxic reactions.  Performed at [facility], [address], [place], [place] [address]    WBC 12/24/2021 11.6 (H)  4.0 - 10.5 K/uL Final   RBC 12/24/2021 4.88  3.87 - 5.11 MIL/uL Final   Hemoglobin 12/24/2021 15.4 (H)  12.0 - 15.0 g/dL Final   HCT 72/53/6644 43.1  36.0 - 46.0 % Final   MCV 12/24/2021 88.3  80.0 - 100.0 fL Final   MCH 12/24/2021 31.6  26.0 - 34.0  pg Final   MCHC 12/24/2021 35.7  30.0 - 36.0 g/dL Final   RDW 03/47/4259 12.7  11.5 - 15.5 % Final   Platelets 12/24/2021 302  150 - 400 K/uL Final   nRBC 12/24/2021 0.0  0.0 - 0.2 % Final   Performed at [facility], [address], [place], [place] [address]   Opiates 12/24/2021 NONE DETECTED  NONE DETECTED Final   Cocaine 12/24/2021 NONE DETECTED  NONE DETECTED Final   Benzodiazepines 12/24/2021 NONE DETECTED  NONE DETECTED Final   Amphetamines 12/24/2021 NONE DETECTED  NONE DETECTED Final   Tetrahydrocannabinol 12/24/2021 POSITIVE (A)  NONE DETECTED Final   Barbiturates 12/24/2021 NONE DETECTED  NONE DETECTED Final   Comment: (NOTE) DRUG SCREEN FOR MEDICAL PURPOSES ONLY.  IF CONFIRMATION IS NEEDED FOR ANY PURPOSE, NOTIFY LAB WITHIN 5 DAYS.  LOWEST DETECTABLE LIMITS FOR URINE DRUG SCREEN Drug Class                     Cutoff (ng/mL) Amphetamine and metabolites    1000 Barbiturate and metabolites    200 Benzodiazepine                 200 Tricyclics and metabolites     300 Opiates and metabolites        300 Cocaine and metabolites        300 THC  50 Performed at [facility], [address] [place], [place] [address]    SARS Coronavirus 2 by RT PCR 12/24/2021 NEGATIVE  NEGATIVE Final   Comment: (NOTE) SARS-CoV-2 target nucleic acids are NOT DETECTED.  The SARS-CoV-2 RNA is generally detectable in upper respiratory specimens during the acute phase of infection. The lowest concentration of SARS-CoV-2 viral copies this assay can detect is 138 copies/mL. A negative result does not preclude SARS-Cov-2 infection and should not be used as the sole basis for treatment or other patient management decisions. A negative result may occur with  improper specimen collection/handling, submission of specimen other than nasopharyngeal swab, presence of viral mutation(s) within the areas targeted by this assay, and inadequate  number of viral copies(<138 copies/mL). A negative result must be combined with clinical observations, patient history, and epidemiological information. The expected result is Negative.  Fact Sheet for Patients:  BloggerCourse.comhttps://www.fda.gov/media/152166/download  Fact Sheet for Healthcare Providers:  SeriousBroker.ithttps://www.fda.gov/media/152162/download  This test is no                          t yet approved or cleared by the [place] FDA and  has been authorized for detection and/or diagnosis of SARS-CoV-2 by FDA under an Emergency Use Authorization (EUA). This EUA will remain  in effect (meaning this test can be used) for the duration of the COVID-19 declaration under Section 564(b)(1) of the Act, 21 U.S.C.section 360bbb-3(b)(1), unless the authorization is terminated  or revoked sooner.       Influenza A by PCR 12/24/2021 NEGATIVE  NEGATIVE Final   Influenza B by PCR 12/24/2021 NEGATIVE  NEGATIVE Final   Comment: (NOTE) The Xpert Xpress SARS-CoV-2/FLU/RSV plus assay is intended as an aid in the diagnosis of influenza from Nasopharyngeal swab specimens and should not be used as a sole basis for treatment. Nasal washings and aspirates are unacceptable for Xpert Xpress SARS-CoV-2/FLU/RSV testing.  Fact Sheet for Patients: BloggerCourse.comhttps://www.fda.gov/media/152166/download  Fact Sheet for Healthcare Providers: SeriousBroker.ithttps://www.fda.gov/media/152162/download  This test is not yet approved or cleared by the [place] FDA and has been authorized for detection and/or diagnosis of SARS-CoV-2 by FDA under an Emergency Use Authorization (EUA). This EUA will remain in effect (meaning this test can be used) for the duration of the COVID-19 declaration under Section 564(b)(1) of the Act, 21 U.S.C. section 360bbb-3(b)(1), unless the authorization is terminated or revoked.  Performed at [facility], [address] [place], [place] [address]    hCG, Conley RollsBeta Chain, Quant, S 12/24/2021 1  <5  mIU/mL Final   Comment:          GEST. AGE      CONC.  (mIU/mL)   <=1 WEEK        5 - 50     2 WEEKS       50 - 500     3 WEEKS       100 - 10,000     4 WEEKS     1,000 - 30,000     5 WEEKS     3,500 - 115,000   6-8 WEEKS     12,000 - 270,000    12 WEEKS     15,000 - 220,000        FEMALE AND NON-PREGNANT FEMALE:     LESS THAN 5 mIU/mL Performed at [facility], [address] [place], [place] [address]    Sodium 12/24/2021 137  135 - 145  mmol/L Final   Potassium 12/24/2021 3.4 (L)  3.5 - 5.1 mmol/L Final   Comment: DELTA CHECK NOTED NO VISIBLE HEMOLYSIS    Chloride 12/24/2021 107  98 - 111 mmol/L Final   CO2 12/24/2021 23  22 - 32 mmol/L Final   Glucose, Bld 12/24/2021 319 (H)  70 - 99 mg/dL Final   Glucose reference range applies only to samples taken after fasting for at least 8 hours.   BUN 12/24/2021 8  6 - 20 mg/dL Final   Creatinine, Ser 12/24/2021 0.77  0.44 - 1.00 mg/dL Final   Calcium 12/24/2021 8.5 (L)  8.9 - 10.3 mg/dL Final   GFR, Estimated 12/24/2021 >60  >60 mL/min Final   Comment: (NOTE) Calculated using the CKD-EPI Creatinine Equation (2021)    Anion gap 12/24/2021 7  5 - 15 Final   Performed at [facility], [place] [address] [place], [place] [address]   Glucose-Capillary 12/24/2021 222 (H)  70 - 99 mg/dL Final   Glucose reference range applies only to samples taken after fasting for at least 8 hours.  Admission on 10/18/2021, Discharged on 10/18/2021  Component Date Value Ref Range Status   WBC 10/18/2021 6.2  4.0 - 10.5 K/uL Final   RBC 10/18/2021 4.55  3.87 - 5.11 MIL/uL Final   Hemoglobin 10/18/2021 14.4  12.0 - 15.0 g/dL Final   HCT 10/18/2021 40.2  36.0 - 46.0 % Final   MCV 10/18/2021 88.4  80.0 - 100.0 fL Final   MCH 10/18/2021 31.6  26.0 - 34.0 pg Final   MCHC 10/18/2021 35.8  30.0 - 36.0 g/dL Final   RDW 10/18/2021 12.3  11.5 - 15.5 % Final   Platelets 10/18/2021 265  150 - 400 K/uL Final   nRBC 10/18/2021 0.0   0.0 - 0.2 % Final   Neutrophils Relative % 10/18/2021 65  % Final   Neutro Abs 10/18/2021 4.0  1.7 - 7.7 K/uL Final   Lymphocytes Relative 10/18/2021 26  % Final   Lymphs Abs 10/18/2021 1.6  0.7 - 4.0 K/uL Final   Monocytes Relative 10/18/2021 7  % Final   Monocytes Absolute 10/18/2021 0.4  0.1 - 1.0 K/uL Final   Eosinophils Relative 10/18/2021 2  % Final   Eosinophils Absolute 10/18/2021 0.1  0.0 - 0.5 K/uL Final   Basophils Relative 10/18/2021 0  % Final   Basophils Absolute 10/18/2021 0.0  0.0 - 0.1 K/uL Final   Immature Granulocytes 10/18/2021 0  % Final   Abs Immature Granulocytes 10/18/2021 0.01  0.00 - 0.07 K/uL Final   Performed at [facility], [address] [place], [place] [address]   Sodium 10/18/2021 134 (L)  135 - 145 mmol/L Final   Potassium 10/18/2021 3.9  3.5 - 5.1 mmol/L Final   Chloride 10/18/2021 100  98 - 111 mmol/L Final   CO2 10/18/2021 24  22 - 32 mmol/L Final   Glucose, Bld 10/18/2021 221 (H)  70 - 99 mg/dL Final   Glucose reference range applies only to samples taken after fasting for at least 8 hours.   BUN 10/18/2021 12  6 - 20 mg/dL Final   Creatinine, Ser 10/18/2021 0.74  0.44 - 1.00 mg/dL Final   Calcium 10/18/2021 9.1  8.9 - 10.3 mg/dL Final   GFR, Estimated 10/18/2021 >60  >60 mL/min Final   Comment: (NOTE) Calculated using the CKD-EPI Creatinine Equation (2021)    Anion gap 10/18/2021 10  5 - 15 Final  Performed at [facility], [address] [place], [place] [address]   Preg, Serum 10/18/2021 NEGATIVE  NEGATIVE Final   Comment:        THE SENSITIVITY OF THIS METHODOLOGY IS >10 mIU/mL. Performed at [facility], [address] [place], [place] [address]    RPR Ser Ql 10/18/2021 NON REACTIVE  NON REACTIVE Final   Performed at [facility], [address], [place], [place] [address]   HSV Culture/Type 10/18/2021 Comment   Final   Comment: (NOTE) Negative No Herpes simplex virus isolated. Performed  At: [facility] [address] [place], [place] [id number] Jolene Schimke MD OZ:[id number]    Source of Sample 10/18/2021 VAGINA   Final   Performed at [facility], [address], [place], [place] [address]  Admission on 10/02/2021, Discharged on 10/10/2021  Component Date Value Ref Range Status   Preg Test, Ur 10/05/2021 NEGATIVE  NEGATIVE Final   Comment:        THE SENSITIVITY OF THIS METHODOLOGY IS >20 mIU/mL. Performed at [facility], [address], [place], [place] [address]    hCG, Conley Rolls, Sharene Butters, S 10/03/2021 1  <5 mIU/mL Final   Comment:          GEST. AGE      CONC.  (mIU/mL)   <=1 WEEK        5 - 50     2 WEEKS       50 - 500     3 WEEKS       100 - 10,000     4 WEEKS     1,000 - 30,000     5 WEEKS     3,500 - 115,000   6-8 WEEKS     12,000 - 270,000    12 WEEKS     15,000 - 220,000        FEMALE AND NON-PREGNANT FEMALE:     LESS THAN 5 mIU/mL Performed at [facility], [address], [place], [place] [address]    Glucose-Capillary 10/04/2021 168 (H)  70 - 99 mg/dL Final   Glucose reference range applies only to samples taken after fasting for at least 8 hours.   Glucose-Capillary 10/05/2021 184 (H)  70 - 99 mg/dL Final   Glucose reference range applies only to samples taken after fasting for at least 8 hours.   Glucose-Capillary 10/05/2021 219 (H)  70 - 99 mg/dL Final   Glucose reference range applies only to samples taken after fasting for at least 8 hours.   Glucose-Capillary 10/05/2021 245 (H)  70 - 99 mg/dL Final   Glucose reference range applies only to samples taken after fasting for at least 8 hours.   Glucose-Capillary 10/06/2021 194 (H)  70 - 99 mg/dL Final   Glucose reference range applies only to samples taken after fasting for at least 8 hours.   Vitamin B-12 10/06/2021 405  180 - 914 pg/mL Final   Comment: (NOTE) This assay is not validated for testing neonatal or myeloproliferative  syndrome specimens for Vitamin B12 levels. Performed at [facility], [address] [place], [place] [address]    Sodium 10/09/2021 136  135 - 145 mmol/L Final   Potassium 10/09/2021 3.6  3.5 - 5.1 mmol/L Final   Chloride 10/09/2021 105  98 - 111 mmol/L Final   CO2 10/09/2021 24  22 - 32 mmol/L Final  Glucose, Bld 10/09/2021 163 (H)  70 - 99 mg/dL Final   Glucose reference range applies only to samples taken after fasting for at least 8 hours.   BUN 10/09/2021 12  6 - 20 mg/dL Final   Creatinine, Ser 10/09/2021 0.82  0.44 - 1.00 mg/dL Final   Calcium 10/09/2021 9.0  8.9 - 10.3 mg/dL Final   GFR, Estimated 10/09/2021 >60  >60 mL/min Final   Comment: (NOTE) Calculated using the CKD-EPI Creatinine Equation (2021)    Anion gap 10/09/2021 7  5 - 15 Final   Performed at [facility], [place] [address], [place], [place] [address]   Magnesium 10/09/2021 2.0  1.7 - 2.4 mg/dL Final   Performed at [place] [address] [place], [place] [address]   Ammonia 10/09/2021 29  9 - 35 umol/L Final   Performed at [facility], [place] [address], [place], [place] [address]   Hgb A1c MFr Bld 10/09/2021 7.6 (H)  4.8 - 5.6 % Final   Comment: (NOTE) Pre diabetes:          5.7%-6.4%  Diabetes:              >6.4%  Glycemic control for   <7.0% adults with diabetes    Mean Plasma Glucose 10/09/2021 171.42  mg/dL Final   Performed at [facility], [place] [address], [place], [place] [address]   Cholesterol 10/09/2021 183  0 - 200 mg/dL Final   Triglycerides 10/09/2021 195 (H)  <150 mg/dL Final   HDL 10/09/2021 41  >40 mg/dL Final   Total CHOL/HDL Ratio 10/09/2021 4.5  RATIO Final   VLDL 10/09/2021 39  0 - 40 mg/dL Final   LDL Cholesterol 10/09/2021 103 (H)  0 - 99 mg/dL Final   Comment:        Total Cholesterol/HDL:CHD Risk Coronary Heart Disease Risk Table                     Men   Women  1/2 Average Risk   3.4   3.3   Average Risk       5.0   4.4  2 X Average Risk   9.6   7.1  3 X Average Risk  23.4   11.0        Use the calculated Patient Ratio above and the CHD Risk Table to determine the patient's CHD Risk.        ATP III CLASSIFICATION (LDL):  <100     mg/dL   Optimal  100-129  mg/dL   Near or Above                    Optimal  130-159  mg/dL   Borderline  160-189  mg/dL   High  >190     mg/dL   Very High Performed at [place] [address], [place], [place] [address]   Admission on 10/01/2021, Discharged on 10/02/2021  Component Date Value Ref Range Status   WBC 10/01/2021 7.9  4.0 - 10.5 K/uL Final   RBC 10/01/2021 4.91  3.87 - 5.11 MIL/uL Final   Hemoglobin 10/01/2021 15.7 (H)  12.0 - 15.0 g/dL Final   HCT 10/01/2021 44.5  36.0 - 46.0 % Final   MCV 10/01/2021 90.6  80.0 - 100.0 fL Final   MCH 10/01/2021 32.0  26.0 - 34.0 pg Final   MCHC 10/01/2021 35.3  30.0 - 36.0 g/dL  Final   RDW 10/01/2021 12.1  11.5 - 15.5 % Final   Platelets 10/01/2021 316  150 - 400 K/uL Final   nRBC 10/01/2021 0.0  0.0 - 0.2 % Final   Neutrophils Relative % 10/01/2021 66  % Final   Neutro Abs 10/01/2021 5.1  1.7 - 7.7 K/uL Final   Lymphocytes Relative 10/01/2021 28  % Final   Lymphs Abs 10/01/2021 2.2  0.7 - 4.0 K/uL Final   Monocytes Relative 10/01/2021 5  % Final   Monocytes Absolute 10/01/2021 0.4  0.1 - 1.0 K/uL Final   Eosinophils Relative 10/01/2021 1  % Final   Eosinophils Absolute 10/01/2021 0.1  0.0 - 0.5 K/uL Final   Basophils Relative 10/01/2021 0  % Final   Basophils Absolute 10/01/2021 0.0  0.0 - 0.1 K/uL Final   Immature Granulocytes 10/01/2021 0  % Final   Abs Immature Granulocytes 10/01/2021 0.02  0.00 - 0.07 K/uL Final   Performed at [facility], [place] [address], [place], [place] [address]   Sodium 10/01/2021 139  135 - 145 mmol/L Final   Potassium 10/01/2021 3.8  3.5 - 5.1 mmol/L Final   Chloride 10/01/2021 104  98 - 111 mmol/L Final   CO2  10/01/2021 27  22 - 32 mmol/L Final   Glucose, Bld 10/01/2021 191 (H)  70 - 99 mg/dL Final   Glucose reference range applies only to samples taken after fasting for at least 8 hours.   BUN 10/01/2021 16  6 - 20 mg/dL Final   Creatinine, Ser 10/01/2021 0.91  0.44 - 1.00 mg/dL Final   Calcium 10/01/2021 9.2  8.9 - 10.3 mg/dL Final   Total Protein 10/01/2021 7.8  6.5 - 8.1 g/dL Final   Albumin 10/01/2021 4.4  3.5 - 5.0 g/dL Final   AST 10/01/2021 48 (H)  15 - 41 U/L Final   ALT 10/01/2021 57 (H)  0 - 44 U/L Final   Alkaline Phosphatase 10/01/2021 77  38 - 126 U/L Final   Total Bilirubin 10/01/2021 1.2  0.3 - 1.2 mg/dL Final   GFR, Estimated 10/01/2021 >60  >60 mL/min Final   Comment: (NOTE) Calculated using the CKD-EPI Creatinine Equation (2021)    Anion gap 10/01/2021 8  5 - 15 Final   Performed at [facility], [place] [address], [place], [place] [address]   Color, Urine 10/01/2021 YELLOW  YELLOW Final   APPearance 10/01/2021 HAZY (A)  CLEAR Final   Specific Gravity, Urine 10/01/2021 1.020  1.005 - 1.030 Final   pH 10/01/2021 5.0  5.0 - 8.0 Final   Glucose, UA 10/01/2021 NEGATIVE  NEGATIVE mg/dL Final   Hgb urine dipstick 10/01/2021 NEGATIVE  NEGATIVE Final   Bilirubin Urine 10/01/2021 NEGATIVE  NEGATIVE Final   Ketones, ur 10/01/2021 NEGATIVE  NEGATIVE mg/dL Final   Protein, ur 10/01/2021 NEGATIVE  NEGATIVE mg/dL Final   Nitrite 10/01/2021 NEGATIVE  NEGATIVE Final   Leukocytes,Ua 10/01/2021 NEGATIVE  NEGATIVE Final   Performed at [place] [address] [place], [place] [id number]   Salicylate Lvl A999333 <7.0 (L)  7.0 - 30.0 mg/dL Final   Performed at [place] [address], [place], [place] [address]   Acetaminophen (Tylenol), Serum 10/01/2021 <10 (L)  10 - 30 ug/mL Final   Comment: (NOTE) Therapeutic concentrations vary significantly. A range of 10-30 ug/mL  may be an effective concentration for many patients.  However, some  are best treated at concentrations outside of  this range. Acetaminophen concentrations >150 ug/mL at 4 hours after ingestion  and >50 ug/mL at 12 hours after ingestion are often associated with  toxic reactions.  Performed at [facility], [place] [address], [place], [place] [address]    Alcohol, Ethyl (B) 10/01/2021 <10  <10 mg/dL Final   Comment: (NOTE) Lowest detectable limit for serum alcohol is 10 mg/dL.  For medical purposes only. Performed at [facility], [place] [address], [place], [place] [address]    SARS Coronavirus 2 by RT PCR 10/02/2021 NEGATIVE  NEGATIVE Final   Comment: (NOTE) SARS-CoV-2 target nucleic acids are NOT DETECTED.  The SARS-CoV-2 RNA is generally detectable in upper respiratory specimens during the acute phase of infection. The lowest concentration of SARS-CoV-2 viral copies this assay can detect is 138 copies/mL. A negative result does not preclude SARS-Cov-2 infection and should not be used as the sole basis for treatment or other patient management decisions. A negative result may occur with  improper specimen collection/handling, submission of specimen other than nasopharyngeal swab, presence of viral mutation(s) within the areas targeted by this assay, and inadequate number of viral copies(<138 copies/mL). A negative result must be combined with clinical observations, patient history, and epidemiological information. The expected result is Negative.  Fact Sheet for Patients:  EntrepreneurPulse.com.au  Fact Sheet for Healthcare Providers:  IncredibleEmployment.be  This test is no                          t yet approved or cleared by the [place] FDA and  has been authorized for detection and/or diagnosis of SARS-CoV-2 by FDA under an Emergency Use Authorization (EUA). This EUA will remain  in effect (meaning this test can be used) for the duration of  the COVID-19 declaration under Section 564(b)(1) of the Act, 21 U.S.C.section 360bbb-3(b)(1), unless the authorization is terminated  or revoked sooner.       Influenza A by PCR 10/02/2021 NEGATIVE  NEGATIVE Final   Influenza B by PCR 10/02/2021 NEGATIVE  NEGATIVE Final   Comment: (NOTE) The Xpert Xpress SARS-CoV-2/FLU/RSV plus assay is intended as an aid in the diagnosis of influenza from Nasopharyngeal swab specimens and should not be used as a sole basis for treatment. Nasal washings and aspirates are unacceptable for Xpert Xpress SARS-CoV-2/FLU/RSV testing.  Fact Sheet for Patients: EntrepreneurPulse.com.au  Fact Sheet for Healthcare Providers: IncredibleEmployment.be  This test is not yet approved or cleared by the [place] FDA and has been authorized for detection and/or diagnosis of SARS-CoV-2 by FDA under an Emergency Use Authorization (EUA). This EUA will remain in effect (meaning this test can be used) for the duration of the COVID-19 declaration under Section 564(b)(1) of the Act, 21 U.S.C. section 360bbb-3(b)(1), unless the authorization is terminated or revoked.  Performed at [facility], [place] [address], [place], [place] [address]   Admission on 09/24/2021, Discharged on 09/24/2021  Component Date Value Ref Range Status   WBC 09/24/2021 14.7 (H)  4.0 - 10.5 K/uL Final   RBC 09/24/2021 4.93  3.87 - 5.11 MIL/uL Final   Hemoglobin 09/24/2021 15.8 (H)  12.0 - 15.0 g/dL Final   HCT 09/24/2021 42.7  36.0 - 46.0 % Final   MCV 09/24/2021 86.6  80.0 - 100.0 fL Final   MCH 09/24/2021 32.0  26.0 - 34.0 pg Final   MCHC 09/24/2021 37.0 (H)  30.0 - 36.0 g/dL Final   RDW 09/24/2021 12.1  11.5 -  15.5 % Final   Platelets 09/24/2021 444 (H)  150 - 400 K/uL Final   nRBC 09/24/2021 0.0  0.0 - 0.2 % Final   Neutrophils Relative % 09/24/2021 88  % Final   Neutro Abs 09/24/2021 13.0 (H)  1.7 - 7.7 K/uL Final    Lymphocytes Relative 09/24/2021 8  % Final   Lymphs Abs 09/24/2021 1.1  0.7 - 4.0 K/uL Final   Monocytes Relative 09/24/2021 4  % Final   Monocytes Absolute 09/24/2021 0.5  0.1 - 1.0 K/uL Final   Eosinophils Relative 09/24/2021 0  % Final   Eosinophils Absolute 09/24/2021 0.0  0.0 - 0.5 K/uL Final   Basophils Relative 09/24/2021 0  % Final   Basophils Absolute 09/24/2021 0.0  0.0 - 0.1 K/uL Final   Immature Granulocytes 09/24/2021 0  % Final   Abs Immature Granulocytes 09/24/2021 0.04  0.00 - 0.07 K/uL Final   Performed at [facility], [place] [address], [place], [place] [address]   Sodium 09/24/2021 131 (L)  135 - 145 mmol/L Final   Potassium 09/24/2021 3.3 (L)  3.5 - 5.1 mmol/L Final   Chloride 09/24/2021 99  98 - 111 mmol/L Final   CO2 09/24/2021 16 (L)  22 - 32 mmol/L Final   Glucose, Bld 09/24/2021 396 (H)  70 - 99 mg/dL Final   Glucose reference range applies only to samples taken after fasting for at least 8 hours.   BUN 09/24/2021 9  6 - 20 mg/dL Final   Creatinine, Ser 09/24/2021 1.00  0.44 - 1.00 mg/dL Final   Calcium 09/24/2021 9.4  8.9 - 10.3 mg/dL Final   Total Protein 09/24/2021 8.5 (H)  6.5 - 8.1 g/dL Final   Albumin 09/24/2021 4.6  3.5 - 5.0 g/dL Final   AST 09/24/2021 39  15 - 41 U/L Final   ALT 09/24/2021 46 (H)  0 - 44 U/L Final   Alkaline Phosphatase 09/24/2021 82  38 - 126 U/L Final   Total Bilirubin 09/24/2021 1.0  0.3 - 1.2 mg/dL Final   GFR, Estimated 09/24/2021 >60  >60 mL/min Final   Comment: (NOTE) Calculated using the CKD-EPI Creatinine Equation (2021)    Anion gap 09/24/2021 16 (H)  5 - 15 Final   Performed at [facility], [place] [address], [place], [place] [address]   Opiates 09/24/2021 NONE DETECTED  NONE DETECTED Final   Cocaine 09/24/2021 NONE DETECTED  NONE DETECTED Final   Benzodiazepines 09/24/2021 NONE DETECTED  NONE DETECTED Final   Amphetamines 09/24/2021 NONE DETECTED  NONE DETECTED Final    Tetrahydrocannabinol 09/24/2021 POSITIVE (A)  NONE DETECTED Final   Barbiturates 09/24/2021 NONE DETECTED  NONE DETECTED Final   Comment: (NOTE) DRUG SCREEN FOR MEDICAL PURPOSES ONLY.  IF CONFIRMATION IS NEEDED FOR ANY PURPOSE, NOTIFY LAB WITHIN 5 DAYS.  LOWEST DETECTABLE LIMITS FOR URINE DRUG SCREEN Drug Class                     Cutoff (ng/mL) Amphetamine and metabolites    1000 Barbiturate and metabolites    200 Benzodiazepine                 [id number] Tricyclics and metabolites     300 Opiates and metabolites        300 Cocaine and metabolites        300 THC  50 Performed at [facility], [address], [place], [place] [address]    Acetaminophen (Tylenol), Serum 09/24/2021 <10 (L)  10 - 30 ug/mL Final   Comment: (NOTE) Therapeutic concentrations vary significantly. A range of 10-30 ug/mL  may be an effective concentration for many patients. However, some  are best treated at concentrations outside of this range. Acetaminophen concentrations >150 ug/mL at 4 hours after ingestion  and >50 ug/mL at 12 hours after ingestion are often associated with  toxic reactions.  Performed at [facility], [address], [place], [place] [address]    Salicylate Lvl 09/24/2021 <7.0 (L)  7.0 - 30.0 mg/dL Final   Performed at [facility], [address] [place], [place] [address]   Alcohol, Ethyl (B) 09/24/2021 <10  <10 mg/dL Final   Comment: (NOTE) Lowest detectable limit for serum alcohol is 10 mg/dL.  For medical purposes only. Performed at [facility], [address] [place], [place] [address]    SARS Coronavirus 2 by RT PCR 09/24/2021 NEGATIVE  NEGATIVE Final   Comment: (NOTE) SARS-CoV-2 target nucleic acids are NOT DETECTED.  The SARS-CoV-2 RNA is generally detectable in upper respiratory specimens during the acute phase of infection. The lowest concentration of SARS-CoV-2  viral copies this assay can detect is 138 copies/mL. A negative result does not preclude SARS-Cov-2 infection and should not be used as the sole basis for treatment or other patient management decisions. A negative result may occur with  improper specimen collection/handling, submission of specimen other than nasopharyngeal swab, presence of viral mutation(s) within the areas targeted by this assay, and inadequate number of viral copies(<138 copies/mL). A negative result must be combined with clinical observations, patient history, and epidemiological information. The expected result is Negative.  Fact Sheet for Patients:  BloggerCourse.com  Fact Sheet for Healthcare Providers:  SeriousBroker.it  This test is no                          t yet approved or cleared by the [place] FDA and  has been authorized for detection and/or diagnosis of SARS-CoV-2 by FDA under an Emergency Use Authorization (EUA). This EUA will remain  in effect (meaning this test can be used) for the duration of the COVID-19 declaration under Section 564(b)(1) of the Act, 21 U.S.C.section 360bbb-3(b)(1), unless the authorization is terminated  or revoked sooner.       Influenza A by PCR 09/24/2021 NEGATIVE  NEGATIVE Final   Influenza B by PCR 09/24/2021 NEGATIVE  NEGATIVE Final   Comment: (NOTE) The Xpert Xpress SARS-CoV-2/FLU/RSV plus assay is intended as an aid in the diagnosis of influenza from Nasopharyngeal swab specimens and should not be used as a sole basis for treatment. Nasal washings and aspirates are unacceptable for Xpert Xpress SARS-CoV-2/FLU/RSV testing.  Fact Sheet for Patients: BloggerCourse.com  Fact Sheet for Healthcare Providers: SeriousBroker.it  This test is not yet approved or cleared by the [place] FDA and has been authorized for detection and/or diagnosis of SARS-CoV-2  by FDA under an Emergency Use Authorization (EUA). This EUA will remain in effect (meaning this test can be used) for the duration of the COVID-19 declaration under Section 564(b)(1) of the Act, 21 U.S.C. section 360bbb-3(b)(1), unless the authorization is terminated or revoked.  Performed at [facility], [address] [place], [place] [address]    Color, Urine 09/24/2021 YELLOW  YELLOW Final  APPearance 09/24/2021 CLEAR  CLEAR Final   Specific Gravity, Urine 09/24/2021 1.028  1.005 - 1.030 Final   pH 09/24/2021 5.0  5.0 - 8.0 Final   Glucose, UA 09/24/2021 >=500 (A)  NEGATIVE mg/dL Final   Hgb urine dipstick 09/24/2021 NEGATIVE  NEGATIVE Final   Bilirubin Urine 09/24/2021 NEGATIVE  NEGATIVE Final   Ketones, ur 09/24/2021 5 (A)  NEGATIVE mg/dL Final   Protein, ur 09/24/2021 30 (A)  NEGATIVE mg/dL Final   Nitrite 09/24/2021 NEGATIVE  NEGATIVE Final   Leukocytes,Ua 09/24/2021 NEGATIVE  NEGATIVE Final   RBC / HPF 09/24/2021 0-5  0 - 5 RBC/hpf Final   WBC, UA 09/24/2021 0-5  0 - 5 WBC/hpf Final   Bacteria, UA 09/24/2021 NONE SEEN  NONE SEEN Final   Squamous Epithelial / LPF 09/24/2021 0-5  0 - 5 Final   Mucus 09/24/2021 PRESENT   Final   Hyaline Casts, UA 09/24/2021 PRESENT   Final   Performed at [facility], [place] [address] [place], [place] [address]   Sodium 09/24/2021 136  135 - 145 mmol/L Final   Potassium 09/24/2021 4.2  3.5 - 5.1 mmol/L Final   Comment: DELTA CHECK NOTED SPECIMEN HEMOLYZED. HEMOLYSIS MAY AFFECT INTEGRITY OF RESULTS.    Chloride 09/24/2021 102  98 - 111 mmol/L Final   CO2 09/24/2021 25  22 - 32 mmol/L Final   Glucose, Bld 09/24/2021 189 (H)  70 - 99 mg/dL Final   Glucose reference range applies only to samples taken after fasting for at least 8 hours.   BUN 09/24/2021 8  6 - 20 mg/dL Final   Creatinine, Ser 09/24/2021 0.87  0.44 - 1.00 mg/dL Final   Calcium 09/24/2021 9.0  8.9 - 10.3 mg/dL Final   GFR, Estimated  09/24/2021 >60  >60 mL/min Final   Comment: (NOTE) Calculated using the CKD-EPI Creatinine Equation (2021)    Anion gap 09/24/2021 9  5 - 15 Final   Performed at [facility], [place] [address] [place], [place] [address]  Admission on 09/02/2021, Discharged on 09/09/2021  Component Date Value Ref Range Status   WBC 09/03/2021 6.4  4.0 - 10.5 K/uL Final   RBC 09/03/2021 4.60  3.87 - 5.11 MIL/uL Final   Hemoglobin 09/03/2021 14.8  12.0 - 15.0 g/dL Final   HCT 09/03/2021 43.2  36.0 - 46.0 % Final   MCV 09/03/2021 93.9  80.0 - 100.0 fL Final   MCH 09/03/2021 32.2  26.0 - 34.0 pg Final   MCHC 09/03/2021 34.3  30.0 - 36.0 g/dL Final   RDW 09/03/2021 13.2  11.5 - 15.5 % Final   Platelets 09/03/2021 299  150 - 400 K/uL Final   nRBC 09/03/2021 0.0  0.0 - 0.2 % Final   Performed at [facility], [place] [address] [place], [place] [address]   Sodium 09/03/2021 134 (L)  135 - 145 mmol/L Final   Potassium 09/03/2021 3.8  3.5 - 5.1 mmol/L Final   Chloride 09/03/2021 103  98 - 111 mmol/L Final   CO2 09/03/2021 22  22 - 32 mmol/L Final   Glucose, Bld 09/03/2021 152 (H)  70 - 99 mg/dL Final   Glucose reference range applies only to samples taken after fasting for at least 8 hours.   BUN 09/03/2021 6  6 - 20 mg/dL Final   Creatinine, Ser 09/03/2021 0.80  0.44 - 1.00 mg/dL Final   Calcium 09/03/2021 9.2  8.9 - 10.3 mg/dL Final  Total Protein 09/03/2021 7.5  6.5 - 8.1 g/dL Final   Albumin 09/03/2021 4.3  3.5 - 5.0 g/dL Final   AST 09/03/2021 43 (H)  15 - 41 U/L Final   ALT 09/03/2021 49 (H)  0 - 44 U/L Final   Alkaline Phosphatase 09/03/2021 72  38 - 126 U/L Final   Total Bilirubin 09/03/2021 0.8  0.3 - 1.2 mg/dL Final   GFR, Estimated 09/03/2021 >60  >60 mL/min Final   Comment: (NOTE) Calculated using the CKD-EPI Creatinine Equation (2021)    Anion gap 09/03/2021 9  5 - 15 Final   Performed at [facility], [place] [address], [place], [place]  [address]   Hgb A1c MFr Bld 09/03/2021 7.7 (H)  4.8 - 5.6 % Final   Comment: (NOTE) Pre diabetes:          5.7%-6.4%  Diabetes:              >6.4%  Glycemic control for   <7.0% adults with diabetes    Mean Plasma Glucose 09/03/2021 174.29  mg/dL Final   Performed at [facility], [place] [address] [place], [place] [address]   Cholesterol 09/03/2021 204 (H)  0 - 200 mg/dL Final   Triglycerides 09/03/2021 165 (H)  <150 mg/dL Final   HDL 09/03/2021 41  >40 mg/dL Final   Total CHOL/HDL Ratio 09/03/2021 5.0  RATIO Final   VLDL 09/03/2021 33  0 - 40 mg/dL Final   LDL Cholesterol 09/03/2021 130 (H)  0 - 99 mg/dL Final   Comment:        Total Cholesterol/HDL:CHD Risk Coronary Heart Disease Risk Table                     Men   Women  1/2 Average Risk   3.4   3.3  Average Risk       5.0   4.4  2 X Average Risk   9.6   7.1  3 X Average Risk  23.4   11.0        Use the calculated Patient Ratio above and the CHD Risk Table to determine the patient's CHD Risk.        ATP III CLASSIFICATION (LDL):  <100     mg/dL   Optimal  100-129  mg/dL   Near or Above                    Optimal  130-159  mg/dL   Borderline  160-189  mg/dL   High  >190     mg/dL   Very High Performed at [place] [address] [place], [place] [address]    TSH 09/03/2021 6.299 (H)  0.350 - 4.500 uIU/mL Final   Comment: Performed by a 3rd Generation assay with a functional sensitivity of <=0.01 uIU/mL. Performed at [facility], [place] [address], [place],  [address]    Glucose-Capillary 09/03/2021 160 (H)  70 - 99 mg/dL Final   Glucose reference range applies only to samples taken after fasting for at least 8 hours.   Comment 1 09/03/2021 Notify RN   Final  Admission on 09/01/2021, Discharged on 09/02/2021  Component Date Value Ref Range Status   SARS Coronavirus 2 by RT PCR 09/01/2021 NEGATIVE  NEGATIVE Final   Comment: (NOTE) SARS-CoV-2 target nucleic acids are  NOT DETECTED.  The SARS-CoV-2 RNA is generally detectable in upper respiratory specimens during the acute phase  of infection. The lowest concentration of SARS-CoV-2 viral copies this assay can detect is 138 copies/mL. A negative result does not preclude SARS-Cov-2 infection and should not be used as the sole basis for treatment or other patient management decisions. A negative result may occur with  improper specimen collection/handling, submission of specimen other than nasopharyngeal swab, presence of viral mutation(s) within the areas targeted by this assay, and inadequate number of viral copies(<138 copies/mL). A negative result must be combined with clinical observations, patient history, and epidemiological information. The expected result is Negative.  Fact Sheet for Patients:  EntrepreneurPulse.com.au  Fact Sheet for Healthcare Providers:  IncredibleEmployment.be  This test is no                          t yet approved or cleared by the [place] FDA and  has been authorized for detection and/or diagnosis of SARS-CoV-2 by FDA under an Emergency Use Authorization (EUA). This EUA will remain  in effect (meaning this test can be used) for the duration of the COVID-19 declaration under Section 564(b)(1) of the Act, 21 U.S.C.section 360bbb-3(b)(1), unless the authorization is terminated  or revoked sooner.       Influenza A by PCR 09/01/2021 NEGATIVE  NEGATIVE Final   Influenza B by PCR 09/01/2021 NEGATIVE  NEGATIVE Final   Comment: (NOTE) The Xpert Xpress SARS-CoV-2/FLU/RSV plus assay is intended as an aid in the diagnosis of influenza from Nasopharyngeal swab specimens and should not be used as a sole basis for treatment. Nasal washings and aspirates are unacceptable for Xpert Xpress SARS-CoV-2/FLU/RSV testing.  Fact Sheet for Patients: EntrepreneurPulse.com.au  Fact Sheet for Healthcare  Providers: IncredibleEmployment.be  This test is not yet approved or cleared by the [place] FDA and has been authorized for detection and/or diagnosis of SARS-CoV-2 by FDA under an Emergency Use Authorization (EUA). This EUA will remain in effect (meaning this test can be used) for the duration of the COVID-19 declaration under Section 564(b)(1) of the Act, 21 U.S.C. section 360bbb-3(b)(1), unless the authorization is terminated or revoked.  Performed at [facility], [place] [address], [place], [place] [address]    Sodium 09/01/2021 134 (L)  135 - 145 mmol/L Final   Potassium 09/01/2021 3.0 (L)  3.5 - 5.1 mmol/L Final   Chloride 09/01/2021 101  98 - 111 mmol/L Final   CO2 09/01/2021 18 (L)  22 - 32 mmol/L Final   Glucose, Bld 09/01/2021 328 (H)  70 - 99 mg/dL Final   Glucose reference range applies only to samples taken after fasting for at least 8 hours.   BUN 09/01/2021 14  6 - 20 mg/dL Final   Creatinine, Ser 09/01/2021 0.90  0.44 - 1.00 mg/dL Final   Calcium 09/01/2021 9.2  8.9 - 10.3 mg/dL Final   Total Protein 09/01/2021 7.6  6.5 - 8.1 g/dL Final   Albumin 09/01/2021 4.4  3.5 - 5.0 g/dL Final   AST 09/01/2021 55 (H)  15 - 41 U/L Final   ALT 09/01/2021 54 (H)  0 - 44 U/L Final   Alkaline Phosphatase 09/01/2021 76  38 - 126 U/L Final   Total Bilirubin 09/01/2021 1.1  0.3 - 1.2 mg/dL Final   GFR, Estimated 09/01/2021 >60  >60 mL/min Final   Comment: (NOTE) Calculated using the CKD-EPI Creatinine Equation (2021)    Anion gap 09/01/2021 15  5 - 15 Final   Performed at [facility], [place]  [address], [place], [place] [address]   Alcohol, Ethyl (B) 09/01/2021 <10  <10 mg/dL Final   Comment: (NOTE) Lowest detectable limit for serum alcohol is 10 mg/dL.  For medical purposes only. Performed at [facility], [place] [address] [place], [place] [address]    Opiates 09/01/2021 NONE DETECTED  NONE DETECTED  Final   Cocaine 09/01/2021 NONE DETECTED  NONE DETECTED Final   Benzodiazepines 09/01/2021 NONE DETECTED  NONE DETECTED Final   Amphetamines 09/01/2021 NONE DETECTED  NONE DETECTED Final   Tetrahydrocannabinol 09/01/2021 POSITIVE (A)  NONE DETECTED Final   Barbiturates 09/01/2021 NONE DETECTED  NONE DETECTED Final   Comment: (NOTE) DRUG SCREEN FOR MEDICAL PURPOSES ONLY.  IF CONFIRMATION IS NEEDED FOR ANY PURPOSE, NOTIFY LAB WITHIN 5 DAYS.  LOWEST DETECTABLE LIMITS FOR URINE DRUG SCREEN Drug Class                     Cutoff (ng/mL) Amphetamine and metabolites    1000 Barbiturate and metabolites    200 Benzodiazepine                 [id number] Tricyclics and metabolites     300 Opiates and metabolites        300 Cocaine and metabolites        300 THC                            50 Performed at [facility], [place] [address], [place], [place] [address]    WBC 09/01/2021 6.7  4.0 - 10.5 K/uL Final   RBC 09/01/2021 4.57  3.87 - 5.11 MIL/uL Final   Hemoglobin 09/01/2021 14.5  12.0 - 15.0 g/dL Final   HCT 09/01/2021 41.0  36.0 - 46.0 % Final   MCV 09/01/2021 89.7  80.0 - 100.0 fL Final   MCH 09/01/2021 31.7  26.0 - 34.0 pg Final   MCHC 09/01/2021 35.4  30.0 - 36.0 g/dL Final   RDW 09/01/2021 12.7  11.5 - 15.5 % Final   Platelets 09/01/2021 276  150 - 400 K/uL Final   nRBC 09/01/2021 0.0  0.0 - 0.2 % Final   Neutrophils Relative % 09/01/2021 67  % Final   Neutro Abs 09/01/2021 4.5  1.7 - 7.7 K/uL Final   Lymphocytes Relative 09/01/2021 24  % Final   Lymphs Abs 09/01/2021 1.6  0.7 - 4.0 K/uL Final   Monocytes Relative 09/01/2021 7  % Final   Monocytes Absolute 09/01/2021 0.5  0.1 - 1.0 K/uL Final   Eosinophils Relative 09/01/2021 2  % Final   Eosinophils Absolute 09/01/2021 0.1  0.0 - 0.5 K/uL Final   Basophils Relative 09/01/2021 0  % Final   Basophils Absolute 09/01/2021 0.0  0.0 - 0.1 K/uL Final   Immature Granulocytes 09/01/2021 0  % Final   Abs Immature Granulocytes  09/01/2021 0.01  0.00 - 0.07 K/uL Final   Performed at [facility], [place] [address] [place], [place] [address]   I-stat hCG, quantitative 09/01/2021 <5.0  <5 mIU/mL Final   Comment 3 09/01/2021          Final   Comment:   GEST. AGE      CONC.  (mIU/mL)   <=1 WEEK        5 - 50     2 WEEKS       50 - 500     3 WEEKS  100 - 10,000     4 WEEKS     1,000 - 30,000        FEMALE AND NON-PREGNANT FEMALE:     LESS THAN 5 mIU/mL    Magnesium 09/01/2021 1.8  1.7 - 2.4 mg/dL Final   Performed at [facility], [place] [address] [place], [place] [address]   Acetaminophen (Tylenol), Serum 09/01/2021 <10 (L)  10 - 30 ug/mL Final   Comment: (NOTE) Therapeutic concentrations vary significantly. A range of 10-30 ug/mL  may be an effective concentration for many patients. However, some  are best treated at concentrations outside of this range. Acetaminophen concentrations >150 ug/mL at 4 hours after ingestion  and >50 ug/mL at 12 hours after ingestion are often associated with  toxic reactions.  Performed at [facility], [place] [address] [place], [place] [id number]    Salicylate Lvl [id number] <7.0 (L)  7.0 - 30.0 mg/dL Final   Performed at [place] [address] [place], [place] [address]   Acetaminophen (Tylenol), Serum 09/01/2021 <10 (L)  10 - 30 ug/mL Final   Comment: (NOTE) Therapeutic concentrations vary significantly. A range of 10-30 ug/mL  may be an effective concentration for many patients. However, some  are best treated at concentrations outside of this range. Acetaminophen concentrations >150 ug/mL at 4 hours after ingestion  and >50 ug/mL at 12 hours after ingestion are often associated with  toxic reactions.  Performed at [facility], [place] [address] [place], [place] [address]    FIO2 09/01/2021 21.00   Final   pH, Ven 09/01/2021 7.423  7.250 - 7.430 Final   pCO2, Ven  09/01/2021 33.8 (L)  44.0 - 60.0 mmHg Final   pO2, Ven 09/01/2021 56.7 (H)  32.0 - 45.0 mmHg Final   Bicarbonate 09/01/2021 21.7  20.0 - 28.0 mmol/L Final   Acid-base deficit 09/01/2021 1.6  0.0 - 2.0 mmol/L Final   O2 Saturation 09/01/2021 89.4  % Final   Patient temperature 09/01/2021 98.6   Final   Performed at [facility], [place] [address], [place], [place] [address]   Color, Urine 09/01/2021 STRAW (A)  YELLOW Final   APPearance 09/01/2021 CLEAR  CLEAR Final   Specific Gravity, Urine 09/01/2021 1.005  1.005 - 1.030 Final   pH 09/01/2021 7.0  5.0 - 8.0 Final   Glucose, UA 09/01/2021 >=500 (A)  NEGATIVE mg/dL Final   Hgb urine dipstick 09/01/2021 NEGATIVE  NEGATIVE Final   Bilirubin Urine 09/01/2021 NEGATIVE  NEGATIVE Final   Ketones, ur 09/01/2021 20 (A)  NEGATIVE mg/dL Final   Protein, ur 09/01/2021 NEGATIVE  NEGATIVE mg/dL Final   Nitrite 09/01/2021 NEGATIVE  NEGATIVE Final   Leukocytes,Ua 09/01/2021 NEGATIVE  NEGATIVE Final   WBC, UA 09/01/2021 0-5  0 - 5 WBC/hpf Final   Bacteria, UA 09/01/2021 RARE (A)  NONE SEEN Final   Squamous Epithelial / LPF 09/01/2021 0-5  0 - 5 Final   Performed at [facility], [place] [address], [place], [place] [address]   MRSA by PCR Next Gen 09/01/2021 NOT DETECTED  NOT DETECTED Final   Comment: (NOTE) The GeneXpert MRSA Assay (FDA approved for NASAL specimens only), is one component of a comprehensive MRSA colonization surveillance program. It is not intended to diagnose MRSA infection nor to guide or monitor treatment for MRSA infections. Test performance is not FDA approved in patients less than 73 years old. Performed at [facility],  [place] [address] [place], [place] [address]    HIV Screen 4th Generation wRfx 09/02/2021 Non Reactive  Non Reactive Final   Performed at [facility], [place] [address] [place], [place] [address]   Sodium 09/02/2021 137  135 - 145 mmol/L Final   Potassium  09/02/2021 4.0  3.5 - 5.1 mmol/L Final   DELTA CHECK NOTED   Chloride 09/02/2021 111  98 - 111 mmol/L Final   CO2 09/02/2021 23  22 - 32 mmol/L Final   Glucose, Bld 09/02/2021 124 (H)  70 - 99 mg/dL Final   Glucose reference range applies only to samples taken after fasting for at least 8 hours.   BUN 09/02/2021 7  6 - 20 mg/dL Final   Creatinine, Ser 09/02/2021 0.72  0.44 - 1.00 mg/dL Final   Calcium 09/02/2021 8.0 (L)  8.9 - 10.3 mg/dL Final   Total Protein 09/02/2021 5.7 (L)  6.5 - 8.1 g/dL Final   Albumin 09/02/2021 3.3 (L)  3.5 - 5.0 g/dL Final   AST 09/02/2021 33  15 - 41 U/L Final   ALT 09/02/2021 40  0 - 44 U/L Final   Alkaline Phosphatase 09/02/2021 58  38 - 126 U/L Final   Total Bilirubin 09/02/2021 0.8  0.3 - 1.2 mg/dL Final   GFR, Estimated 09/02/2021 >60  >60 mL/min Final   Comment: (NOTE) Calculated using the CKD-EPI Creatinine Equation (2021)    Anion gap 09/02/2021 3 (L)  5 - 15 Final   Performed at [facility], [place] [address], [place], [place] [address]   WBC 09/02/2021 5.2  4.0 - 10.5 K/uL Final   RBC 09/02/2021 3.68 (L)  3.87 - 5.11 MIL/uL Final   Hemoglobin 09/02/2021 11.6 (L)  12.0 - 15.0 g/dL Final   HCT 09/02/2021 34.9 (L)  36.0 - 46.0 % Final   MCV 09/02/2021 94.8  80.0 - 100.0 fL Final   MCH 09/02/2021 31.5  26.0 - 34.0 pg Final   MCHC 09/02/2021 33.2  30.0 - 36.0 g/dL Final   RDW 09/02/2021 13.1  11.5 - 15.5 % Final   Platelets 09/02/2021 184  150 - 400 K/uL Final   nRBC 09/02/2021 0.0  0.0 - 0.2 % Final   Performed at [facility], [place] [address], [place], [place] [address]   Glucose-Capillary 09/01/2021 176 (H)  70 - 99 mg/dL Final   Glucose reference range applies only to samples taken after fasting for at least 8 hours.   Comment 1 09/01/2021 Notify RN   Final   Comment 2 09/01/2021 Document in Chart   Final   Glucose-Capillary 09/01/2021 143 (H)  70 - 99 mg/dL Final   Glucose reference range applies only to  samples taken after fasting for at least 8 hours.   Glucose-Capillary 09/02/2021 147 (H)  70 - 99 mg/dL Final   Glucose reference range applies only to samples taken after fasting for at least 8 hours.   Glucose-Capillary 09/02/2021 126 (H)  70 - 99 mg/dL Final   Glucose reference range applies only to samples taken after fasting for at least 8 hours.   Comment 1 09/02/2021 Notify RN   Final   Comment 2 09/02/2021 Document in Chart   Final    Blood Alcohol level:  Lab Results  Component Value Date   [facility] <10 12/24/2021   ETH <10 [id number]    Metabolic Disorder Labs: Lab Results  Component Value Date   HGBA1C 7.6 (H) 10/09/2021  MPG 171.42 10/09/2021   MPG 174.29 09/03/2021   No results found for: PROLACTIN Lab Results  Component Value Date   CHOL 213 (H) 12/26/2021   TRIG 185 (H) 12/26/2021   HDL 50 12/26/2021   CHOLHDL 4.3 12/26/2021   VLDL 37 12/26/2021   LDLCALC 126 (H) 12/26/2021   LDLCALC 103 (H) 10/09/2021    Therapeutic Lab Levels: No results found for: LITHIUM No results found for: VALPROATE No components found for:  CBMZ  Physical Findings   AIMS    Flowsheet Row Admission (Discharged) from 10/02/2021 in [place] 400B Admission (Discharged) from 09/02/2021 in [place] 400B Admission (Discharged) from 01/12/2021 in [place] 400B  AIMS Total Score 0 0 0      AUDIT    Flowsheet Row Admission (Discharged) from 05/30/2021 in [place] 500B Admission (Discharged) from 01/12/2021 in [place] 400B  Alcohol Use Disorder Identification Test Final Score (AUDIT) 1 1      PHQ2-9    Flowsheet Row ED from 10/01/2021 in [place] DEPT  PHQ-2 Total Score 0      Flowsheet Row ED from 12/26/2021 in [facility] ED from 12/24/2021 in [facility] DEPT ED from 12/17/2021 in [place] CATEGORY High Risk No Risk No Risk        Musculoskeletal  Strength & Muscle Tone: within normal limits Gait & Station: normal Patient leans: N/A  Psychiatric Specialty Exam  Presentation  General Appearance: Appropriate for Environment  Eye Contact:Fair  Speech:Clear and Coherent  Speech Volume:Normal  Handedness:Right   Mood and Affect  Mood:Depressed  Affect:Congruent   Thought Process  Thought Processes:Coherent  Descriptions of Associations:Intact  Orientation:Full (Time, Place and Person)  Thought Content:Logical  Diagnosis of Schizophrenia or Schizoaffective disorder in past: No    Hallucinations:Hallucinations: None  Ideas of Reference:None  Suicidal Thoughts:Suicidal Thoughts: Yes, Active SI Active Intent and/or Plan: With Plan  Homicidal Thoughts:Homicidal Thoughts: No   Sensorium  Memory:Immediate Fair; Remote Fair; Recent Fair  Judgment:Fair  Insight:Fair   Executive Functions  Concentration:Fair  Attention Span:Fair  [place]   Psychomotor Activity  Psychomotor Activity:Psychomotor Activity: Normal   Assets  Assets:Communication Skills; Desire for Improvement; Physical Health; Leisure Time   Sleep  Sleep:Sleep: Fair Number of Hours of Sleep: 4   Nutritional Assessment (For OBS and FBC admissions only) Has the patient had a weight loss or gain of 10 pounds or more in the last 3 months?: No Has the patient had a decrease in food intake/or appetite?: Yes Does the patient have dental problems?: No Does the patient have eating habits or behaviors that may be indicators of an eating disorder including binging or inducing vomiting?: No Has the patient recently lost weight without trying?: 0 Has the patient been eating poorly because of a decreased appetite?: 0 Malnutrition Screening  Tool Score: 0    Physical Exam  Physical Exam Constitutional:      Appearance: Normal appearance.  HENT:     Head: Normocephalic.     Nose: Nose normal.  Eyes:     Conjunctiva/sclera: Conjunctivae normal.  Cardiovascular:     Rate and Rhythm: Tachycardia present.  Pulmonary:     Effort: Pulmonary effort is normal.  Musculoskeletal:        General: Normal range of motion.  Cervical back: Normal range of motion.  Neurological:     Mental Status: She is alert and oriented to person, place, and time.   Review of Systems  Constitutional: Negative.   HENT: Negative.    Eyes: Negative.   Respiratory:  Positive for cough.   Cardiovascular: Negative.   Gastrointestinal: Negative.   Genitourinary: Negative.   Musculoskeletal: Negative.   Skin: Negative.   Neurological: Negative.   Endo/Heme/Allergies: Negative.   Blood pressure 117/79, pulse (!) 113, temperature (!) 97.3 F (36.3 C), temperature source Temporal, resp. rate 16, SpO2 99 %. There is no height or weight on file to calculate BMI.  Treatment Plan Summary: Patient admitted to the [facility] due to suicidal ideations with a plan.  MDD Patient refusing medication management   Dispo-ongoing patient will need to establish outpatient psychiatry and counseling  Will obtain EKG and reassess QTC before ordering albuterol inhaler due to past hx of prolonged QTC.  Marissa Calamity, NP 12/27/2021 12:26 PM

## 2021-12-27 NOTE — ED Notes (Signed)
Pt was given snacks

## 2021-12-27 NOTE — ED Notes (Signed)
[facility] report handed off to Norm, Charity fundraiserN.

## 2021-12-27 NOTE — ED Notes (Signed)
Tech got a new EKG on pt

## 2021-12-27 NOTE — ED Notes (Signed)
Patient interacted and communicated well during wrap up and AA group and gave positive feedback. Patient goals were to get control of her depression,and utilize her coping skills when feeling depressed and anxious.

## 2021-12-27 NOTE — ED Notes (Signed)
Pt requested for trazodone to help her sleep. 

## 2021-12-27 NOTE — ED Notes (Signed)
Patient participating well AA in groups.

## 2021-12-27 NOTE — ED Notes (Signed)
Patient denies SI,HI,AVH. Patient is cooperative and interacts well with staff. Respiratory is even and unlabored. No distress noted. Patient resting in bed at present. will continue to monitor for safety.  ?

## 2021-12-27 NOTE — ED Notes (Signed)
Appears to be resting quietly with eyes closed , respirations even and unlabored , no distress noted. Will continue to monitor for safety. °

## 2021-12-27 NOTE — ED Notes (Signed)
Alert and oriented x 4 , thought process organized and goal directed and without paranoid or delusional content , denies AVH . Verbalizing feeling of depression , reports had been having SI earlier today but denies at present , reports she is feeling safe and hopeful since being admitted . Pt pleasant and cooperative with admission process . Will monitor for safety .

## 2021-12-27 NOTE — ED Notes (Signed)
Pt was not wearing any clothes when nurse went to check on pt. Nurse advised pt to put on clothes because we have a female MHT doing checks. Pt verbalized understanding. Pt had put on clothes when nurse check back on pt. Will continue to monitor for safety.

## 2021-12-28 LAB — GLUCOSE, CAPILLARY
Glucose-Capillary: 221 mg/dL — ABNORMAL HIGH (ref 70–99)
Glucose-Capillary: 264 mg/dL — ABNORMAL HIGH (ref 70–99)
Glucose-Capillary: 289 mg/dL — ABNORMAL HIGH (ref 70–99)

## 2021-12-28 MED ORDER — ALBUTEROL SULFATE HFA 108 (90 BASE) MCG/ACT IN AERS
1.0000 | INHALATION_SPRAY | Freq: Four times a day (QID) | RESPIRATORY_TRACT | Status: DC | PRN
  Filled 2021-12-28: qty 8

## 2021-12-28 NOTE — ED Notes (Signed)
Patient refused to come to group she said she did not not feel like it, explained group was important in her well being, she then said maybe later. Told her will leave the material for her to read and the worksheet to fill out on her own time. The title is Wellness Assessment.

## 2021-12-28 NOTE — ED Notes (Signed)
Pt returned to bed after eating breakfast.  Reported that she felt like the provider "was rude to me"   when asked replied that :"provider did not order what I asked for".   Pt had been given PRN medication for anxiety.  No signs of withdrawal noted.

## 2021-12-28 NOTE — ED Notes (Signed)
Pt complained of anxiety. She stated her anxiety is 9 on a scale of 0-10.

## 2021-12-28 NOTE — ED Notes (Signed)
Pt continues to remain in bed.   Currently Awake after the provider made rounds. No distress noted. CBG monitored and pt will get ss insulin once she is up for breakfast.

## 2021-12-28 NOTE — ED Notes (Signed)
Pt was complaining of headache and requested for tylenol.

## 2021-12-28 NOTE — ED Provider Notes (Signed)
Behavioral Health Progress Note  Date and Time: 12/28/2021 9:53 AM Name: Erika Rhodes MRN:  [id number]  Subjective:  Erika Rhodes is a 41 year old female with psychiatric history of depression, psychosis, and PTSD. Patient presented voluntarily to [facility] for walking assessment. Patient presented with chief complaint of worsening depression and suicidal ideation with plan to walk into traffic.   Patient seen and evaluated face-to-face by this provider, chart reviewed and case discussed with Dr. Lovette Cliche. On evaluation, patient is alert and oriented x4. Her thought process is logical and speech is clear and coherent.    Patient states that she slept okay last night and that the trazodone knocked her out. She states that her appetite is fair. She states that she is not having any suicidal ideations this morning, and that she last had suicidal thoughts yesterday morning. She denies homicidal ideations. She denies auditory and visual hallucinations. There is no objective evidence that the patient is currently responding to internal or external stimuli. She continues to endorse depressive symptoms of crying spells, and not wanting to get out of bed. She states that she went to the one group that was offered yesterday and feels like it was helpful.   I discussed with the patient that she was prescribed Prolixin, Seroquel, and Cymbalta in the past.  Patient states that none of those medications worked for her and mess up her mind. She states that she is not interested in starting any medications while she is here. Patient states that she only wants therapy.  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 [place] hospitalization 10/02/21:Prescribed Prolixn 5 mg  TID. [place] hospitalization 09/02/21: Cymbalta 30 mg daily [facility] hospitalization 05/30/2021: Seroquel 400 mg QHS and Cymbalta 30 mg daily  Past Medical History:  Past Medical History:  Diagnosis Date   Allergic reaction    Anxiety    Asthma    Depression    DM (diabetes mellitus), gestational    Migraines    Rheumatoid arthritis ([place]) 2016   Diagnosed Dr.  Lamarr Lulas; has been treated with MTX, Humira, etc, but made her sick.  Was taking CBD oil and stopped as tested positive in a drug screen for work.    Past Surgical History:  Procedure Laterality Date   ABDOMINAL HYSTERECTOMY  2017   Fibroids; Both ovaries intact still   BACK SURGERY     CESAREAN SECTION  2009   CHOLECYSTECTOMY  2010   laparoscopic   TONSILLECTOMY     Family History:  Family History  Problem Relation Age of Onset   Arthritis Mother        Rheumatoid   Depression Daughter        and anxiety   Allergies Son    Family Psychiatric  History: daughter -depression and anxiety  Social History:  Social History   Substance and Sexual Activity  Alcohol Use Yes   Alcohol/week: 2.0 standard drinks   Types: 2 Glasses of wine per week   Comment: occ     Social History   Substance and Sexual Activity  Drug Use Yes   Types: Marijuana   Comment: "cannabis delta 10"    Social History   Socioeconomic History   Marital status: Single    Spouse name: Not on file   Number of children: 5  Years of education: 61   Highest education level: Associate degree: academic program  Occupational History   Not on file  Tobacco Use   Smoking status: Never   Smokeless tobacco: Never  Vaping Use   Vaping Use: Some days  Substance and Sexual Activity   Alcohol use: Yes    Alcohol/week: 2.0 standard drinks    Types: 2 Glasses of wine per week    Comment: occ   Drug use: Yes    Types: Marijuana    Comment: "cannabis delta 10"   Sexual activity: Yes    Birth control/protection: Surgical  Other Topics Concern    Not on file  Social History Narrative   Lives with current boyfriend   See history of present illness for more history 04/22/2020   Social Determinants of Health   Financial Resource Strain: Not on file  Food Insecurity: Not on file  Transportation Needs: Not on file  Physical Activity: Not on file  Stress: Not on file  Social Connections: Not on file   SDOH:  SDOH Screenings   Alcohol Screen: Low Risk    Last Alcohol Screening Score (AUDIT): 2  Depression (PHQ2-9): Low Risk    PHQ-2 Score: 0  Financial Resource Strain: Not on file  Food Insecurity: Not on file  Housing: Not on file  Physical Activity: Not on file  Social Connections: Not on file  Stress: Not on file  Tobacco Use: Low Risk    Smoking Tobacco Use: Never   Smokeless Tobacco Use: Never   Passive Exposure: Not on file  Transportation Needs: Not on file   Additional Social History:    Current Medications:  Current Facility-Administered Medications  Medication Dose Route Frequency Provider Last Rate Last Admin   acetaminophen (TYLENOL) tablet 650 mg  650 mg Oral Q6H PRN Ajibola, Ene A, NP   650 mg at 12/28/21 0611   albuterol (VENTOLIN HFA) 108 (90 Base) MCG/ACT inhaler 1-2 puff  1-2 puff Inhalation Q6H PRN Nikeisha Klutz L, NP       alum & mag hydroxide-simeth (MAALOX/MYLANTA) 200-200-20 MG/5ML suspension 30 mL  30 mL Oral Q4H PRN Ajibola, Ene A, NP       hydrOXYzine (ATARAX) tablet 25 mg  25 mg Oral TID PRN Ajibola, Ene A, NP   25 mg at 12/28/21 0626   insulin aspart (novoLOG) injection 0-15 Units  0-15 Units Subcutaneous TID WC Ajibola, Ene A, NP   5 Units at 12/28/21 0837   insulin aspart (novoLOG) injection 0-5 Units  0-5 Units Subcutaneous QHS Ajibola, Ene A, NP   2 Units at 12/27/21 2146   magnesium hydroxide (MILK OF MAGNESIA) suspension 30 mL  30 mL Oral Daily PRN Ajibola, Ene A, NP       traZODone (DESYREL) tablet 50 mg  50 mg Oral QHS PRN Ajibola, Ene A, NP   50 mg at 12/27/21 2151   Current  Outpatient Medications  Medication Sig Dispense Refill   albuterol (VENTOLIN HFA) 108 (90 Base) MCG/ACT inhaler Inhale 1-2 puffs into the lungs every 4 (four) hours as needed for wheezing or shortness of breath. 1 each 0   melatonin 3 MG TABS tablet Take 3 mg by mouth at bedtime.     montelukast (SINGULAIR) 10 MG tablet Take 10 mg by mouth at bedtime.     omeprazole (PRILOSEC) 20 MG capsule Take 20 mg by mouth daily.      Labs  Lab Results:  Admission on 12/26/2021  Component Date Value  Ref Range Status   SARS Coronavirus 2 by RT PCR 12/26/2021 NEGATIVE  NEGATIVE Final   Comment: (NOTE) SARS-CoV-2 target nucleic acids are NOT DETECTED.  The SARS-CoV-2 RNA is generally detectable in upper respiratory specimens during the acute phase of infection. The lowest concentration of SARS-CoV-2 viral copies this assay can detect is 138 copies/mL. A negative result does not preclude SARS-Cov-2 infection and should not be used as the sole basis for treatment or other patient management decisions. A negative result may occur with  improper specimen collection/handling, submission of specimen other than nasopharyngeal swab, presence of viral mutation(s) within the areas targeted by this assay, and inadequate number of viral copies(<138 copies/mL). A negative result must be combined with clinical observations, patient history, and epidemiological information. The expected result is Negative.  Fact Sheet for Patients:  EntrepreneurPulse.com.au  Fact Sheet for Healthcare Providers:  IncredibleEmployment.be  This test is no                          t yet approved or cleared by the [place] FDA and  has been authorized for detection and/or diagnosis of SARS-CoV-2 by FDA under an Emergency Use Authorization (EUA). This EUA will remain  in effect (meaning this test can be used) for the duration of the COVID-19 declaration under Section 564(b)(1) of the Act,  21 U.S.C.section 360bbb-3(b)(1), unless the authorization is terminated  or revoked sooner.       Influenza A by PCR 12/26/2021 NEGATIVE  NEGATIVE Final   Influenza B by PCR 12/26/2021 NEGATIVE  NEGATIVE Final   Comment: (NOTE) The Xpert Xpress SARS-CoV-2/FLU/RSV plus assay is intended as an aid in the diagnosis of influenza from Nasopharyngeal swab specimens and should not be used as a sole basis for treatment. Nasal washings and aspirates are unacceptable for Xpert Xpress SARS-CoV-2/FLU/RSV testing.  Fact Sheet for Patients: EntrepreneurPulse.com.au  Fact Sheet for Healthcare Providers: IncredibleEmployment.be  This test is not yet approved or cleared by the [place] FDA and has been authorized for detection and/or diagnosis of SARS-CoV-2 by FDA under an Emergency Use Authorization (EUA). This EUA will remain in effect (meaning this test can be used) for the duration of the COVID-19 declaration under Section 564(b)(1) of the Act, 21 U.S.C. section 360bbb-3(b)(1), unless the authorization is terminated or revoked.  Performed at [facility], [place] [address], [place], [place] [address]    WBC 12/26/2021 10.9 (H)  4.0 - 10.5 K/uL Final   RBC 12/26/2021 4.89  3.87 - 5.11 MIL/uL Final   Hemoglobin 12/26/2021 15.3 (H)  12.0 - 15.0 g/dL Final   HCT 12/26/2021 42.2  36.0 - 46.0 % Final   MCV 12/26/2021 86.3  80.0 - 100.0 fL Final   MCH 12/26/2021 31.3  26.0 - 34.0 pg Final   MCHC 12/26/2021 36.3 (H)  30.0 - 36.0 g/dL Final   RDW 12/26/2021 12.4  11.5 - 15.5 % Final   Platelets 12/26/2021 327  150 - 400 K/uL Final   nRBC 12/26/2021 0.0  0.0 - 0.2 % Final   Neutrophils Relative % 12/26/2021 68  % Final   Neutro Abs 12/26/2021 7.4  1.7 - 7.7 K/uL Final   Lymphocytes Relative 12/26/2021 26  % Final   Lymphs Abs 12/26/2021 2.9  0.7 - 4.0 K/uL Final   Monocytes Relative 12/26/2021 5  % Final   Monocytes Absolute 12/26/2021 0.6  0.1 - 1.0  K/uL Final   Eosinophils Relative 12/26/2021  1  % Final   Eosinophils Absolute 12/26/2021 0.1  0.0 - 0.5 K/uL Final   Basophils Relative 12/26/2021 0  % Final   Basophils Absolute 12/26/2021 0.0  0.0 - 0.1 K/uL Final   Immature Granulocytes 12/26/2021 0  % Final   Abs Immature Granulocytes 12/26/2021 0.02  0.00 - 0.07 K/uL Final   Performed at [facility], [place] [address] [place], [place] [address]   Sodium 12/26/2021 132 (L)  135 - 145 mmol/L Final   Potassium 12/26/2021 3.4 (L)  3.5 - 5.1 mmol/L Final   Chloride 12/26/2021 98  98 - 111 mmol/L Final   CO2 12/26/2021 22  22 - 32 mmol/L Final   Glucose, Bld 12/26/2021 337 (H)  70 - 99 mg/dL Final   Glucose reference range applies only to samples taken after fasting for at least 8 hours.   BUN 12/26/2021 7  6 - 20 mg/dL Final   Creatinine, Ser 12/26/2021 0.74  0.44 - 1.00 mg/dL Final   Calcium 12/26/2021 9.2  8.9 - 10.3 mg/dL Final   Total Protein 12/26/2021 7.8  6.5 - 8.1 g/dL Final   Albumin 12/26/2021 4.2  3.5 - 5.0 g/dL Final   AST 12/26/2021 24  15 - 41 U/L Final   ALT 12/26/2021 32  0 - 44 U/L Final   Alkaline Phosphatase 12/26/2021 82  38 - 126 U/L Final   Total Bilirubin 12/26/2021 0.7  0.3 - 1.2 mg/dL Final   GFR, Estimated 12/26/2021 >60  >60 mL/min Final   Comment: (NOTE) Calculated using the CKD-EPI Creatinine Equation (2021)    Anion gap 12/26/2021 12  5 - 15 Final   Performed at [facility], [employer] [address], [place], [place] [address]   Cholesterol 12/26/2021 213 (H)  0 - 200 mg/dL Final   Triglycerides 12/26/2021 185 (H)  <150 mg/dL Final   HDL 12/26/2021 50  >40 mg/dL Final   Total CHOL/HDL Ratio 12/26/2021 4.3  RATIO Final   VLDL 12/26/2021 37  0 - 40 mg/dL Final   LDL Cholesterol 12/26/2021 126 (H)  0 - 99 mg/dL Final   Comment:        Total Cholesterol/HDL:CHD Risk Coronary Heart Disease Risk Table                     Men   Women  1/2 Average Risk   3.4   3.3  Average Risk       5.0   4.4  2 X  Average Risk   9.6   7.1  3 X Average Risk  23.4   11.0        Use the calculated Patient Ratio above and the CHD Risk Table to determine the patient's CHD Risk.        ATP III CLASSIFICATION (LDL):  <100     mg/dL   Optimal  100-129  mg/dL   Near or Above                    Optimal  130-159  mg/dL   Borderline  160-189  mg/dL   High  >190     mg/dL   Very High Performed at [place] [address] [place], [place] [address]    TSH 12/26/2021 7.098 (H)  0.350 - 4.500 uIU/mL Final   Comment: Performed by a 3rd Generation assay with a functional sensitivity of <=0.01 uIU/mL. Performed at [facility],  [address] [place], [place] [address]    POC Amphetamine UR 12/26/2021 None Detected  NONE DETECTED (Cut Off Level 1000 ng/mL) Final   POC Secobarbital (BAR) 12/26/2021 None Detected  NONE DETECTED (Cut Off Level 300 ng/mL) Final   POC Buprenorphine (BUP) 12/26/2021 None Detected  NONE DETECTED (Cut Off Level 10 ng/mL) Final   POC Oxazepam (BZO) 12/26/2021 None Detected  NONE DETECTED (Cut Off Level 300 ng/mL) Final   POC Cocaine UR 12/26/2021 None Detected  NONE DETECTED (Cut Off Level 300 ng/mL) Final   POC Methamphetamine UR 12/26/2021 None Detected  NONE DETECTED (Cut Off Level 1000 ng/mL) Final   POC Morphine 12/26/2021 None Detected  NONE DETECTED (Cut Off Level 300 ng/mL) Final   POC Oxycodone UR 12/26/2021 None Detected  NONE DETECTED (Cut Off Level 100 ng/mL) Final   POC Methadone UR 12/26/2021 None Detected  NONE DETECTED (Cut Off Level 300 ng/mL) Final   POC Marijuana UR 12/26/2021 None Detected  NONE DETECTED (Cut Off Level 50 ng/mL) Final   SARS Coronavirus 2 Ag 12/26/2021 Negative  Negative Preliminary   Preg Test, Ur 12/26/2021 NEGATIVE  NEGATIVE Final   Comment:        THE SENSITIVITY OF THIS METHODOLOGY IS >24 mIU/mL    SARSCOV2ONAVIRUS 2 AG 12/26/2021 NEGATIVE  NEGATIVE Final   Comment: (NOTE) SARS-CoV-2 antigen NOT DETECTED.   Negative  results are presumptive.  Negative results do not preclude SARS-CoV-2 infection and should not be used as the sole basis for treatment or other patient management decisions, including infection  control decisions, particularly in the presence of clinical signs and  symptoms consistent with COVID-19, or in those who have been in contact with the virus.  Negative results must be combined with clinical observations, patient history, and epidemiological information. The expected result is Negative.  Fact Sheet for Patients: HandmadeRecipes.com.cy  Fact Sheet for Healthcare Providers: FuneralLife.at  This test is not yet approved or cleared by the [place] FDA and  has been authorized for detection and/or diagnosis of SARS-CoV-2 by FDA under an Emergency Use Authorization (EUA).  This EUA will remain in effect (meaning this test can be used) for the duration of  the COV                          ID-19 declaration under Section 564(b)(1) of the Act, 21 U.S.C. section 360bbb-3(b)(1), unless the authorization is terminated or revoked sooner.     Glucose-Capillary 12/27/2021 250 (H)  70 - 99 mg/dL Final   Glucose reference range applies only to samples taken after fasting for at least 8 hours.   Glucose-Capillary 12/27/2021 309 (H)  70 - 99 mg/dL Final   Glucose reference range applies only to samples taken after fasting for at least 8 hours.   Glucose-Capillary 12/27/2021 397 (H)  70 - 99 mg/dL Final   Glucose reference range applies only to samples taken after fasting for at least 8 hours.   Glucose-Capillary 12/28/2021 221 (H)  70 - 99 mg/dL Final   Glucose reference range applies only to samples taken after fasting for at least 8 hours.  Admission on 12/24/2021, Discharged on 12/25/2021  Component Date Value Ref Range Status   Sodium 12/24/2021 134 (L)  135 - 145 mmol/L Final   Potassium 12/24/2021 2.7 (LL)  3.5 - 5.1 mmol/L Final    Comment: CRITICAL RESULT CALLED TO, READ BACK BY AND VERIFIED WITH:  KADY LIVINGSTON 12/24/21 @  0124 VS    Chloride 12/24/2021 101  98 - 111 mmol/L Final   CO2 12/24/2021 24  22 - 32 mmol/L Final   Glucose, Bld 12/24/2021 259 (H)  70 - 99 mg/dL Final   Glucose reference range applies only to samples taken after fasting for at least 8 hours.   BUN 12/24/2021 11  6 - 20 mg/dL Final   Creatinine, Ser 12/24/2021 0.67  0.44 - 1.00 mg/dL Final   Calcium 12/24/2021 9.0  8.9 - 10.3 mg/dL Final   Total Protein 12/24/2021 7.0  6.5 - 8.1 g/dL Final   Albumin 12/24/2021 4.0  3.5 - 5.0 g/dL Final   AST 12/24/2021 25  15 - 41 U/L Final   ALT 12/24/2021 29  0 - 44 U/L Final   Alkaline Phosphatase 12/24/2021 73  38 - 126 U/L Final   Total Bilirubin 12/24/2021 0.8  0.3 - 1.2 mg/dL Final   GFR, Estimated 12/24/2021 >60  >60 mL/min Final   Comment: (NOTE) Calculated using the CKD-EPI Creatinine Equation (2021)    Anion gap 12/24/2021 9  5 - 15 Final   Performed at [facility], [employer] [address] [place], [place] [address]   Alcohol, Ethyl (B) 12/24/2021 <10  <10 mg/dL Final   Comment: (NOTE) Lowest detectable limit for serum alcohol is 10 mg/dL.  For medical purposes only. Performed at [facility], [place] [address] [place], [place] [id number]    Salicylate Lvl 99991111 <7.0 (L)  7.0 - 30.0 mg/dL Final   Performed at [place] [address] [place], [place] [address]   Acetaminophen (Tylenol), Serum 12/24/2021 <10 (L)  10 - 30 ug/mL Final   Comment: (NOTE) Therapeutic concentrations vary significantly. A range of 10-30 ug/mL  may be an effective concentration for many patients. However, some  are best treated at concentrations outside of this range. Acetaminophen concentrations >150 ug/mL at 4 hours after ingestion  and >50 ug/mL at 12 hours after ingestion are often associated with  toxic reactions.  Performed at [facility], [place] [address] [place], [place] [address]    WBC 12/24/2021 11.6 (H)  4.0 - 10.5 K/uL Final   RBC 12/24/2021 4.88  3.87 - 5.11 MIL/uL Final   Hemoglobin 12/24/2021 15.4 (H)  12.0 - 15.0 g/dL Final   HCT 12/24/2021 43.1  36.0 - 46.0 % Final   MCV 12/24/2021 88.3  80.0 - 100.0 fL Final   MCH 12/24/2021 31.6  26.0 - 34.0 pg Final   MCHC 12/24/2021 35.7  30.0 - 36.0 g/dL Final   RDW 12/24/2021 12.7  11.5 - 15.5 % Final   Platelets 12/24/2021 302  150 - 400 K/uL Final   nRBC 12/24/2021 0.0  0.0 - 0.2 % Final   Performed at [facility], [place] [address], [place], [place] [address]   Opiates 12/24/2021 NONE DETECTED  NONE DETECTED Final   Cocaine 12/24/2021 NONE DETECTED  NONE DETECTED Final   Benzodiazepines 12/24/2021 NONE DETECTED  NONE DETECTED Final   Amphetamines 12/24/2021 NONE DETECTED  NONE DETECTED Final   Tetrahydrocannabinol 12/24/2021 POSITIVE (A)  NONE DETECTED Final   Barbiturates 12/24/2021 NONE DETECTED  NONE DETECTED Final   Comment: (NOTE) DRUG SCREEN FOR MEDICAL PURPOSES ONLY.  IF CONFIRMATION IS NEEDED FOR ANY PURPOSE, NOTIFY LAB WITHIN 5 DAYS.  LOWEST DETECTABLE LIMITS FOR URINE DRUG SCREEN Drug Class  Cutoff (ng/mL) Amphetamine and metabolites    1000 Barbiturate and metabolites    200 Benzodiazepine                 [id number] Tricyclics and metabolites     300 Opiates and metabolites        300 Cocaine and metabolites        300 THC                            50 Performed at [facility], [place] [address] [place], [place] [address]    SARS Coronavirus 2 by RT PCR 12/24/2021 NEGATIVE  NEGATIVE Final   Comment: (NOTE) SARS-CoV-2 target nucleic acids are NOT DETECTED.  The SARS-CoV-2 RNA is generally detectable in upper respiratory specimens during the acute phase of infection. The lowest concentration of SARS-CoV-2 viral copies this assay can detect is 138 copies/mL. A negative result does not  preclude SARS-Cov-2 infection and should not be used as the sole basis for treatment or other patient management decisions. A negative result may occur with  improper specimen collection/handling, submission of specimen other than nasopharyngeal swab, presence of viral mutation(s) within the areas targeted by this assay, and inadequate number of viral copies(<138 copies/mL). A negative result must be combined with clinical observations, patient history, and epidemiological information. The expected result is Negative.  Fact Sheet for Patients:  EntrepreneurPulse.com.au  Fact Sheet for Healthcare Providers:  IncredibleEmployment.be  This test is no                          t yet approved or cleared by the [place] FDA and  has been authorized for detection and/or diagnosis of SARS-CoV-2 by FDA under an Emergency Use Authorization (EUA). This EUA will remain  in effect (meaning this test can be used) for the duration of the COVID-19 declaration under Section 564(b)(1) of the Act, 21 U.S.C.section 360bbb-3(b)(1), unless the authorization is terminated  or revoked sooner.       Influenza A by PCR 12/24/2021 NEGATIVE  NEGATIVE Final   Influenza B by PCR 12/24/2021 NEGATIVE  NEGATIVE Final   Comment: (NOTE) The Xpert Xpress SARS-CoV-2/FLU/RSV plus assay is intended as an aid in the diagnosis of influenza from Nasopharyngeal swab specimens and should not be used as a sole basis for treatment. Nasal washings and aspirates are unacceptable for Xpert Xpress SARS-CoV-2/FLU/RSV testing.  Fact Sheet for Patients: EntrepreneurPulse.com.au  Fact Sheet for Healthcare Providers: IncredibleEmployment.be  This test is not yet approved or cleared by the [place] FDA and has been authorized for detection and/or diagnosis of SARS-CoV-2 by FDA under an Emergency Use Authorization (EUA). This EUA will remain in  effect (meaning this test can be used) for the duration of the COVID-19 declaration under Section 564(b)(1) of the Act, 21 U.S.C. section 360bbb-3(b)(1), unless the authorization is terminated or revoked.  Performed at [facility], [place] [address], [place], [place] [address]    hCG, Ollen Barges, Quant, S 12/24/2021 1  <5 mIU/mL Final   Comment:          GEST. AGE      CONC.  (mIU/mL)   <=1 WEEK        5 - 50     2 WEEKS       50 - 500     3 WEEKS       100 -  10,000     4 WEEKS     1,000 - 30,000     5 WEEKS     3,500 - 115,000   6-8 WEEKS     12,000 - 270,000    12 WEEKS     15,000 - 220,000        FEMALE AND NON-PREGNANT FEMALE:     LESS THAN 5 mIU/mL Performed at [facility], [place] [address], [place], [place] [address]    Sodium 12/24/2021 137  135 - 145 mmol/L Final   Potassium 12/24/2021 3.4 (L)  3.5 - 5.1 mmol/L Final   Comment: DELTA CHECK NOTED NO VISIBLE HEMOLYSIS    Chloride 12/24/2021 107  98 - 111 mmol/L Final   CO2 12/24/2021 23  22 - 32 mmol/L Final   Glucose, Bld 12/24/2021 319 (H)  70 - 99 mg/dL Final   Glucose reference range applies only to samples taken after fasting for at least 8 hours.   BUN 12/24/2021 8  6 - 20 mg/dL Final   Creatinine, Ser 12/24/2021 0.77  0.44 - 1.00 mg/dL Final   Calcium 12/24/2021 8.5 (L)  8.9 - 10.3 mg/dL Final   GFR, Estimated 12/24/2021 >60  >60 mL/min Final   Comment: (NOTE) Calculated using the CKD-EPI Creatinine Equation (2021)    Anion gap 12/24/2021 7  5 - 15 Final   Performed at [facility], [place] [address], [place], [place] [address]   Glucose-Capillary 12/24/2021 222 (H)  70 - 99 mg/dL Final   Glucose reference range applies only to samples taken after fasting for at least 8 hours.  Admission on 10/18/2021, Discharged on 10/18/2021  Component Date Value Ref Range Status   WBC 10/18/2021 6.2  4.0 - 10.5 K/uL Final   RBC 10/18/2021 4.55  3.87 - 5.11 MIL/uL Final    Hemoglobin 10/18/2021 14.4  12.0 - 15.0 g/dL Final   HCT 10/18/2021 40.2  36.0 - 46.0 % Final   MCV 10/18/2021 88.4  80.0 - 100.0 fL Final   MCH 10/18/2021 31.6  26.0 - 34.0 pg Final   MCHC 10/18/2021 35.8  30.0 - 36.0 g/dL Final   RDW 10/18/2021 12.3  11.5 - 15.5 % Final   Platelets 10/18/2021 265  150 - 400 K/uL Final   nRBC 10/18/2021 0.0  0.0 - 0.2 % Final   Neutrophils Relative % 10/18/2021 65  % Final   Neutro Abs 10/18/2021 4.0  1.7 - 7.7 K/uL Final   Lymphocytes Relative 10/18/2021 26  % Final   Lymphs Abs 10/18/2021 1.6  0.7 - 4.0 K/uL Final   Monocytes Relative 10/18/2021 7  % Final   Monocytes Absolute 10/18/2021 0.4  0.1 - 1.0 K/uL Final   Eosinophils Relative 10/18/2021 2  % Final   Eosinophils Absolute 10/18/2021 0.1  0.0 - 0.5 K/uL Final   Basophils Relative 10/18/2021 0  % Final   Basophils Absolute 10/18/2021 0.0  0.0 - 0.1 K/uL Final   Immature Granulocytes 10/18/2021 0  % Final   Abs Immature Granulocytes 10/18/2021 0.01  0.00 - 0.07 K/uL Final   Performed at [facility], [address] [place], [place] [address]   Sodium 10/18/2021 134 (L)  135 - 145 mmol/L Final   Potassium 10/18/2021 3.9  3.5 - 5.1 mmol/L Final   Chloride 10/18/2021 100  98 - 111 mmol/L Final   CO2 10/18/2021 24  22 - 32 mmol/L Final   Glucose, Bld 10/18/2021 221 (  H)  70 - 99 mg/dL Final   Glucose reference range applies only to samples taken after fasting for at least 8 hours.   BUN 10/18/2021 12  6 - 20 mg/dL Final   Creatinine, Ser 10/18/2021 0.74  0.44 - 1.00 mg/dL Final   Calcium 10/18/2021 9.1  8.9 - 10.3 mg/dL Final   GFR, Estimated 10/18/2021 >60  >60 mL/min Final   Comment: (NOTE) Calculated using the CKD-EPI Creatinine Equation (2021)    Anion gap 10/18/2021 10  5 - 15 Final   Performed at [facility], [address] [place], [place] [address]   Preg, Serum 10/18/2021 NEGATIVE  NEGATIVE Final   Comment:        THE SENSITIVITY OF THIS METHODOLOGY IS >10  mIU/mL. Performed at [facility], [address] [place], [place] [address]    RPR Ser Ql 10/18/2021 NON REACTIVE  NON REACTIVE Final   Performed at [facility], [place] [address] [place], [place] [address]   HSV Culture/Type 10/18/2021 Comment   Final   Comment: (NOTE) Negative No Herpes simplex virus isolated. Performed At: [facility] [place], [place] [id number] Rush Farmer MD [id number]    Source of Sample 10/18/2021 VAGINA   Final   Performed at [facility], [address] [place], [place] [address]  Admission on 10/02/2021, Discharged on 10/10/2021  Component Date Value Ref Range Status   Preg Test, Ur 10/05/2021 NEGATIVE  NEGATIVE Final   Comment:        THE SENSITIVITY OF THIS METHODOLOGY IS >20 mIU/mL. Performed at [facility], [place] [address], [place], [place] [address]    hCG, Ollen Barges, America Brown, S 10/03/2021 1  <5 mIU/mL Final   Comment:          GEST. AGE      CONC.  (mIU/mL)   <=1 WEEK        5 - 50     2 WEEKS       50 - 500     3 WEEKS       100 - 10,000     4 WEEKS     1,000 - 30,000     5 WEEKS     3,500 - 115,000   6-8 WEEKS     12,000 - 270,000    12 WEEKS     15,000 - 220,000        FEMALE AND NON-PREGNANT FEMALE:     LESS THAN 5 mIU/mL Performed at [facility], [place] [address], [place], [place] [address]    Glucose-Capillary 10/04/2021 168 (H)  70 - 99 mg/dL Final   Glucose reference range applies only to samples taken after fasting for at least 8 hours.   Glucose-Capillary 10/05/2021 184 (H)  70 - 99 mg/dL Final   Glucose reference range applies only to samples taken after fasting for at least 8 hours.   Glucose-Capillary 10/05/2021 219 (H)  70 - 99 mg/dL Final   Glucose reference range applies only to samples taken after fasting for at least 8 hours.   Glucose-Capillary 10/05/2021 245 (H)  70 - 99 mg/dL Final   Glucose reference range applies only to  samples taken after fasting for at least 8 hours.   Glucose-Capillary 10/06/2021 194 (H)  70 - 99 mg/dL Final   Glucose reference range applies only to samples taken after fasting for at least 8  hours.   Vitamin B-12 10/06/2021 405  180 - 914 pg/mL Final   Comment: (NOTE) This assay is not validated for testing neonatal or myeloproliferative syndrome specimens for Vitamin B12 levels. Performed at [facility], [place] [address] [place], [place] [address]    Sodium 10/09/2021 136  135 - 145 mmol/L Final   Potassium 10/09/2021 3.6  3.5 - 5.1 mmol/L Final   Chloride 10/09/2021 105  98 - 111 mmol/L Final   CO2 10/09/2021 24  22 - 32 mmol/L Final   Glucose, Bld 10/09/2021 163 (H)  70 - 99 mg/dL Final   Glucose reference range applies only to samples taken after fasting for at least 8 hours.   BUN 10/09/2021 12  6 - 20 mg/dL Final   Creatinine, Ser 10/09/2021 0.82  0.44 - 1.00 mg/dL Final   Calcium 10/09/2021 9.0  8.9 - 10.3 mg/dL Final   GFR, Estimated 10/09/2021 >60  >60 mL/min Final   Comment: (NOTE) Calculated using the CKD-EPI Creatinine Equation (2021)    Anion gap 10/09/2021 7  5 - 15 Final   Performed at [facility], [place] [address], [place], [place] [address]   Magnesium 10/09/2021 2.0  1.7 - 2.4 mg/dL Final   Performed at [place] [address] [place], [place] [address]   Ammonia 10/09/2021 29  9 - 35 umol/L Final   Performed at [facility], [place] [address], [place], [place] [address]   Hgb A1c MFr Bld 10/09/2021 7.6 (H)  4.8 - 5.6 % Final   Comment: (NOTE) Pre diabetes:          5.7%-6.4%  Diabetes:              >6.4%  Glycemic control for   <7.0% adults with diabetes    Mean Plasma Glucose 10/09/2021 171.42  mg/dL Final   Performed at [facility], [place] [address], [place], [place] [address]   Cholesterol 10/09/2021 183  0 - 200 mg/dL Final   Triglycerides 10/09/2021 195 (H)  <150  mg/dL Final   HDL 10/09/2021 41  >40 mg/dL Final   Total CHOL/HDL Ratio 10/09/2021 4.5  RATIO Final   VLDL 10/09/2021 39  0 - 40 mg/dL Final   LDL Cholesterol 10/09/2021 103 (H)  0 - 99 mg/dL Final   Comment:        Total Cholesterol/HDL:CHD Risk Coronary Heart Disease Risk Table                     Men   Women  1/2 Average Risk   3.4   3.3  Average Risk       5.0   4.4  2 X Average Risk   9.6   7.1  3 X Average Risk  23.4   11.0        Use the calculated Patient Ratio above and the CHD Risk Table to determine the patient's CHD Risk.        ATP III CLASSIFICATION (LDL):  <100     mg/dL   Optimal  100-129  mg/dL   Near or Above                    Optimal  130-159  mg/dL   Borderline  160-189  mg/dL   High  >190     mg/dL   Very High Performed at [place] [address] [place], [place]  [id number]   Admission on 10/01/2021, Discharged on 10/02/2021  Component Date Value Ref Range Status   WBC 10/01/2021 7.9  4.0 - 10.5 K/uL Final   RBC 10/01/2021 4.91  3.87 - 5.11 MIL/uL Final   Hemoglobin 10/01/2021 15.7 (H)  12.0 - 15.0 g/dL Final   HCT 10/01/2021 44.5  36.0 - 46.0 % Final   MCV 10/01/2021 90.6  80.0 - 100.0 fL Final   MCH 10/01/2021 32.0  26.0 - 34.0 pg Final   MCHC 10/01/2021 35.3  30.0 - 36.0 g/dL Final   RDW 10/01/2021 12.1  11.5 - 15.5 % Final   Platelets 10/01/2021 316  150 - 400 K/uL Final   nRBC 10/01/2021 0.0  0.0 - 0.2 % Final   Neutrophils Relative % 10/01/2021 66  % Final   Neutro Abs 10/01/2021 5.1  1.7 - 7.7 K/uL Final   Lymphocytes Relative 10/01/2021 28  % Final   Lymphs Abs 10/01/2021 2.2  0.7 - 4.0 K/uL Final   Monocytes Relative 10/01/2021 5  % Final   Monocytes Absolute 10/01/2021 0.4  0.1 - 1.0 K/uL Final   Eosinophils Relative 10/01/2021 1  % Final   Eosinophils Absolute 10/01/2021 0.1  0.0 - 0.5 K/uL Final   Basophils Relative 10/01/2021 0  % Final   Basophils Absolute 10/01/2021 0.0  0.0 - 0.1 K/uL Final   Immature  Granulocytes 10/01/2021 0  % Final   Abs Immature Granulocytes 10/01/2021 0.02  0.00 - 0.07 K/uL Final   Performed at [facility], [place] [address] [place], [place] [address]   Sodium 10/01/2021 139  135 - 145 mmol/L Final   Potassium 10/01/2021 3.8  3.5 - 5.1 mmol/L Final   Chloride 10/01/2021 104  98 - 111 mmol/L Final   CO2 10/01/2021 27  22 - 32 mmol/L Final   Glucose, Bld 10/01/2021 191 (H)  70 - 99 mg/dL Final   Glucose reference range applies only to samples taken after fasting for at least 8 hours.   BUN 10/01/2021 16  6 - 20 mg/dL Final   Creatinine, Ser 10/01/2021 0.91  0.44 - 1.00 mg/dL Final   Calcium 10/01/2021 9.2  8.9 - 10.3 mg/dL Final   Total Protein 10/01/2021 7.8  6.5 - 8.1 g/dL Final   Albumin 10/01/2021 4.4  3.5 - 5.0 g/dL Final   AST 10/01/2021 48 (H)  15 - 41 U/L Final   ALT 10/01/2021 57 (H)  0 - 44 U/L Final   Alkaline Phosphatase 10/01/2021 77  38 - 126 U/L Final   Total Bilirubin 10/01/2021 1.2  0.3 - 1.2 mg/dL Final   GFR, Estimated 10/01/2021 >60  >60 mL/min Final   Comment: (NOTE) Calculated using the CKD-EPI Creatinine Equation (2021)    Anion gap 10/01/2021 8  5 - 15 Final   Performed at [facility], [place] [address], [place], [place] [address]   Color, Urine 10/01/2021 YELLOW  YELLOW Final   APPearance 10/01/2021 HAZY (A)  CLEAR Final   Specific Gravity, Urine 10/01/2021 1.020  1.005 - 1.030 Final   pH 10/01/2021 5.0  5.0 - 8.0 Final   Glucose, UA 10/01/2021 NEGATIVE  NEGATIVE mg/dL Final   Hgb urine dipstick 10/01/2021 NEGATIVE  NEGATIVE Final   Bilirubin Urine 10/01/2021 NEGATIVE  NEGATIVE Final   Ketones, ur 10/01/2021 NEGATIVE  NEGATIVE mg/dL Final   Protein, ur 10/01/2021 NEGATIVE  NEGATIVE mg/dL Final   Nitrite 10/01/2021 NEGATIVE  NEGATIVE Final   Leukocytes,Ua 10/01/2021 NEGATIVE  NEGATIVE Final   Performed at [facility], [place] [address], [place], [place] [id number]   Salicylate Lvl  A999333 <7.0 (L)  7.0 - 30.0 mg/dL Final   Performed at [place] [address], [place], [place] [address]   Acetaminophen (Tylenol), Serum 10/01/2021 <10 (L)  10 - 30 ug/mL Final   Comment: (NOTE) Therapeutic concentrations vary significantly. A range of 10-30 ug/mL  may be an effective concentration for many patients. However, some  are best treated at concentrations outside of this range. Acetaminophen concentrations >150 ug/mL at 4 hours after ingestion  and >50 ug/mL at 12 hours after ingestion are often associated with  toxic reactions.  Performed at [facility], [place] [address], [place], [place] [address]    Alcohol, Ethyl (B) 10/01/2021 <10  <10 mg/dL Final   Comment: (NOTE) Lowest detectable limit for serum alcohol is 10 mg/dL.  For medical purposes only. Performed at [facility], [place] [address], [place], [place] [address]    SARS Coronavirus 2 by RT PCR 10/02/2021 NEGATIVE  NEGATIVE Final   Comment: (NOTE) SARS-CoV-2 target nucleic acids are NOT DETECTED.  The SARS-CoV-2 RNA is generally detectable in upper respiratory specimens during the acute phase of infection. The lowest concentration of SARS-CoV-2 viral copies this assay can detect is 138 copies/mL. A negative result does not preclude SARS-Cov-2 infection and should not be used as the sole basis for treatment or other patient management decisions. A negative result may occur with  improper specimen collection/handling, submission of specimen other than nasopharyngeal swab, presence of viral mutation(s) within the areas targeted by this assay, and inadequate number of viral copies(<138 copies/mL). A negative result must be combined with clinical observations, patient history, and epidemiological information. The expected result is Negative.  Fact Sheet for Patients:  EntrepreneurPulse.com.au  Fact Sheet for Healthcare  Providers:  IncredibleEmployment.be  This test is no                          t yet approved or cleared by the [place] FDA and  has been authorized for detection and/or diagnosis of SARS-CoV-2 by FDA under an Emergency Use Authorization (EUA). This EUA will remain  in effect (meaning this test can be used) for the duration of the COVID-19 declaration under Section 564(b)(1) of the Act, 21 U.S.C.section 360bbb-3(b)(1), unless the authorization is terminated  or revoked sooner.       Influenza A by PCR 10/02/2021 NEGATIVE  NEGATIVE Final   Influenza B by PCR 10/02/2021 NEGATIVE  NEGATIVE Final   Comment: (NOTE) The Xpert Xpress SARS-CoV-2/FLU/RSV plus assay is intended as an aid in the diagnosis of influenza from Nasopharyngeal swab specimens and should not be used as a sole basis for treatment. Nasal washings and aspirates are unacceptable for Xpert Xpress SARS-CoV-2/FLU/RSV testing.  Fact Sheet for Patients: EntrepreneurPulse.com.au  Fact Sheet for Healthcare Providers: IncredibleEmployment.be  This test is not yet approved or cleared by the [place] FDA and has been authorized for detection and/or diagnosis of SARS-CoV-2 by FDA under an Emergency Use Authorization (EUA). This EUA will remain in effect (meaning this test can be used) for the duration of the COVID-19 declaration under Section 564(b)(1) of the Act, 21 U.S.C. section 360bbb-3(b)(1), unless the authorization is terminated or revoked.  Performed at [facility], [place] [address], [place], [place] [address]   Admission on 09/24/2021, Discharged on 09/24/2021  Component  Date Value Ref Range Status   WBC 09/24/2021 14.7 (H)  4.0 - 10.5 K/uL Final   RBC 09/24/2021 4.93  3.87 - 5.11 MIL/uL Final   Hemoglobin 09/24/2021 15.8 (H)  12.0 - 15.0 g/dL Final   HCT 09/24/2021 42.7  36.0 - 46.0 % Final   MCV 09/24/2021 86.6  80.0 - 100.0 fL  Final   MCH 09/24/2021 32.0  26.0 - 34.0 pg Final   MCHC 09/24/2021 37.0 (H)  30.0 - 36.0 g/dL Final   RDW 09/24/2021 12.1  11.5 - 15.5 % Final   Platelets 09/24/2021 444 (H)  150 - 400 K/uL Final   nRBC 09/24/2021 0.0  0.0 - 0.2 % Final   Neutrophils Relative % 09/24/2021 88  % Final   Neutro Abs 09/24/2021 13.0 (H)  1.7 - 7.7 K/uL Final   Lymphocytes Relative 09/24/2021 8  % Final   Lymphs Abs 09/24/2021 1.1  0.7 - 4.0 K/uL Final   Monocytes Relative 09/24/2021 4  % Final   Monocytes Absolute 09/24/2021 0.5  0.1 - 1.0 K/uL Final   Eosinophils Relative 09/24/2021 0  % Final   Eosinophils Absolute 09/24/2021 0.0  0.0 - 0.5 K/uL Final   Basophils Relative 09/24/2021 0  % Final   Basophils Absolute 09/24/2021 0.0  0.0 - 0.1 K/uL Final   Immature Granulocytes 09/24/2021 0  % Final   Abs Immature Granulocytes 09/24/2021 0.04  0.00 - 0.07 K/uL Final   Performed at [facility], [place] [address] [place], [place] [address]   Sodium 09/24/2021 131 (L)  135 - 145 mmol/L Final   Potassium 09/24/2021 3.3 (L)  3.5 - 5.1 mmol/L Final   Chloride 09/24/2021 99  98 - 111 mmol/L Final   CO2 09/24/2021 16 (L)  22 - 32 mmol/L Final   Glucose, Bld 09/24/2021 396 (H)  70 - 99 mg/dL Final   Glucose reference range applies only to samples taken after fasting for at least 8 hours.   BUN 09/24/2021 9  6 - 20 mg/dL Final   Creatinine, Ser 09/24/2021 1.00  0.44 - 1.00 mg/dL Final   Calcium 09/24/2021 9.4  8.9 - 10.3 mg/dL Final   Total Protein 09/24/2021 8.5 (H)  6.5 - 8.1 g/dL Final   Albumin 09/24/2021 4.6  3.5 - 5.0 g/dL Final   AST 09/24/2021 39  15 - 41 U/L Final   ALT 09/24/2021 46 (H)  0 - 44 U/L Final   Alkaline Phosphatase 09/24/2021 82  38 - 126 U/L Final   Total Bilirubin 09/24/2021 1.0  0.3 - 1.2 mg/dL Final   GFR, Estimated 09/24/2021 >60  >60 mL/min Final   Comment: (NOTE) Calculated using the CKD-EPI Creatinine Equation (2021)    Anion gap 09/24/2021 16 (H)  5 - 15 Final    Performed at [facility], [place] [address], [place], [place] [address]   Opiates 09/24/2021 NONE DETECTED  NONE DETECTED Final   Cocaine 09/24/2021 NONE DETECTED  NONE DETECTED Final   Benzodiazepines 09/24/2021 NONE DETECTED  NONE DETECTED Final   Amphetamines 09/24/2021 NONE DETECTED  NONE DETECTED Final   Tetrahydrocannabinol 09/24/2021 POSITIVE (A)  NONE DETECTED Final   Barbiturates 09/24/2021 NONE DETECTED  NONE DETECTED Final   Comment: (NOTE) DRUG SCREEN FOR MEDICAL PURPOSES ONLY.  IF CONFIRMATION IS NEEDED FOR ANY PURPOSE, NOTIFY LAB WITHIN 5 DAYS.  LOWEST DETECTABLE LIMITS FOR URINE DRUG SCREEN Drug Class  Cutoff (ng/mL) Amphetamine and metabolites    1000 Barbiturate and metabolites    200 Benzodiazepine                 A999333 Tricyclics and metabolites     300 Opiates and metabolites        300 Cocaine and metabolites        300 THC                            50 Performed at [facility], [place] [address], [place], [place] [address]    Acetaminophen (Tylenol), Serum 09/24/2021 <10 (L)  10 - 30 ug/mL Final   Comment: (NOTE) Therapeutic concentrations vary significantly. A range of 10-30 ug/mL  may be an effective concentration for many patients. However, some  are best treated at concentrations outside of this range. Acetaminophen concentrations >150 ug/mL at 4 hours after ingestion  and >50 ug/mL at 12 hours after ingestion are often associated with  toxic reactions.  Performed at [facility], [place] [address] [place], [place] [id number]    Salicylate Lvl 0000000 <7.0 (L)  7.0 - 30.0 mg/dL Final   Performed at [place] [address], [place], [place] [address]   Alcohol, Ethyl (B) 09/24/2021 <10  <10 mg/dL Final   Comment: (NOTE) Lowest detectable limit for serum alcohol is 10 mg/dL.  For medical purposes only. Performed at [facility], [place] [address] [place], [place] [address]    SARS Coronavirus 2 by RT PCR 09/24/2021 NEGATIVE  NEGATIVE Final   Comment: (NOTE) SARS-CoV-2 target nucleic acids are NOT DETECTED.  The SARS-CoV-2 RNA is generally detectable in upper respiratory specimens during the acute phase of infection. The lowest concentration of SARS-CoV-2 viral copies this assay can detect is 138 copies/mL. A negative result does not preclude SARS-Cov-2 infection and should not be used as the sole basis for treatment or other patient management decisions. A negative result may occur with  improper specimen collection/handling, submission of specimen other than nasopharyngeal swab, presence of viral mutation(s) within the areas targeted by this assay, and inadequate number of viral copies(<138 copies/mL). A negative result must be combined with clinical observations, patient history, and epidemiological information. The expected result is Negative.  Fact Sheet for Patients:  EntrepreneurPulse.com.au  Fact Sheet for Healthcare Providers:  IncredibleEmployment.be  This test is no                          t yet approved or cleared by the [place] FDA and  has been authorized for detection and/or diagnosis of SARS-CoV-2 by FDA under an Emergency Use Authorization (EUA). This EUA will remain  in effect (meaning this test can be used) for the duration of the COVID-19 declaration under Section 564(b)(1) of the Act, 21 U.S.C.section 360bbb-3(b)(1), unless the authorization is terminated  or revoked sooner.       Influenza A by PCR 09/24/2021 NEGATIVE  NEGATIVE Final   Influenza B by PCR 09/24/2021 NEGATIVE  NEGATIVE Final   Comment: (NOTE) The Xpert Xpress SARS-CoV-2/FLU/RSV plus assay is intended as an aid in the diagnosis of influenza from Nasopharyngeal swab specimens and should not be used as a sole basis for treatment. Nasal washings and aspirates are unacceptable for  Xpert Xpress SARS-CoV-2/FLU/RSV testing.  Fact Sheet for Patients: EntrepreneurPulse.com.au  Fact Sheet for Healthcare Providers: IncredibleEmployment.be  This  test is not yet approved or cleared by the [place] and has been authorized for detection and/or diagnosis of SARS-CoV-2 by FDA under an Emergency Use Authorization (EUA). This EUA will remain in effect (meaning this test can be used) for the duration of the COVID-19 declaration under Section 564(b)(1) of the Act, 21 U.S.C. section 360bbb-3(b)(1), unless the authorization is terminated or revoked.  Performed at [facility], [place] [address], [place], [place] [address]    Color, Urine 09/24/2021 YELLOW  YELLOW Final   APPearance 09/24/2021 CLEAR  CLEAR Final   Specific Gravity, Urine 09/24/2021 1.028  1.005 - 1.030 Final   pH 09/24/2021 5.0  5.0 - 8.0 Final   Glucose, UA 09/24/2021 >=500 (A)  NEGATIVE mg/dL Final   Hgb urine dipstick 09/24/2021 NEGATIVE  NEGATIVE Final   Bilirubin Urine 09/24/2021 NEGATIVE  NEGATIVE Final   Ketones, ur 09/24/2021 5 (A)  NEGATIVE mg/dL Final   Protein, ur 09/24/2021 30 (A)  NEGATIVE mg/dL Final   Nitrite 09/24/2021 NEGATIVE  NEGATIVE Final   Leukocytes,Ua 09/24/2021 NEGATIVE  NEGATIVE Final   RBC / HPF 09/24/2021 0-5  0 - 5 RBC/hpf Final   WBC, UA 09/24/2021 0-5  0 - 5 WBC/hpf Final   Bacteria, UA 09/24/2021 NONE SEEN  NONE SEEN Final   Squamous Epithelial / LPF 09/24/2021 0-5  0 - 5 Final   Mucus 09/24/2021 PRESENT   Final   Hyaline Casts, UA 09/24/2021 PRESENT   Final   Performed at [facility], [place] [address], [place], [place] [address]   Sodium 09/24/2021 136  135 - 145 mmol/L Final   Potassium 09/24/2021 4.2  3.5 - 5.1 mmol/L Final   Comment: DELTA CHECK NOTED SPECIMEN HEMOLYZED. HEMOLYSIS MAY AFFECT INTEGRITY OF RESULTS.    Chloride 09/24/2021 102  98 - 111 mmol/L Final   CO2 09/24/2021 25  22 - 32  mmol/L Final   Glucose, Bld 09/24/2021 189 (H)  70 - 99 mg/dL Final   Glucose reference range applies only to samples taken after fasting for at least 8 hours.   BUN 09/24/2021 8  6 - 20 mg/dL Final   Creatinine, Ser 09/24/2021 0.87  0.44 - 1.00 mg/dL Final   Calcium 09/24/2021 9.0  8.9 - 10.3 mg/dL Final   GFR, Estimated 09/24/2021 >60  >60 mL/min Final   Comment: (NOTE) Calculated using the CKD-EPI Creatinine Equation (2021)    Anion gap 09/24/2021 9  5 - 15 Final   Performed at [facility], [place] [address] [place], [place] [address]  Admission on 09/02/2021, Discharged on 09/09/2021  Component Date Value Ref Range Status   WBC 09/03/2021 6.4  4.0 - 10.5 K/uL Final   RBC 09/03/2021 4.60  3.87 - 5.11 MIL/uL Final   Hemoglobin 09/03/2021 14.8  12.0 - 15.0 g/dL Final   HCT 09/03/2021 43.2  36.0 - 46.0 % Final   MCV 09/03/2021 93.9  80.0 - 100.0 fL Final   MCH 09/03/2021 32.2  26.0 - 34.0 pg Final   MCHC 09/03/2021 34.3  30.0 - 36.0 g/dL Final   RDW 09/03/2021 13.2  11.5 - 15.5 % Final   Platelets 09/03/2021 299  150 - 400 K/uL Final   nRBC 09/03/2021 0.0  0.0 - 0.2 % Final   Performed at [facility], [place] [address], [place], [place] [address]   Sodium 09/03/2021 134 (L)  135 - 145 mmol/L Final   Potassium 09/03/2021 3.8  3.5 -  5.1 mmol/L Final   Chloride 09/03/2021 103  98 - 111 mmol/L Final   CO2 09/03/2021 22  22 - 32 mmol/L Final   Glucose, Bld 09/03/2021 152 (H)  70 - 99 mg/dL Final   Glucose reference range applies only to samples taken after fasting for at least 8 hours.   BUN 09/03/2021 6  6 - 20 mg/dL Final   Creatinine, Ser 09/03/2021 0.80  0.44 - 1.00 mg/dL Final   Calcium 09/03/2021 9.2  8.9 - 10.3 mg/dL Final   Total Protein 09/03/2021 7.5  6.5 - 8.1 g/dL Final   Albumin 09/03/2021 4.3  3.5 - 5.0 g/dL Final   AST 09/03/2021 43 (H)  15 - 41 U/L Final   ALT 09/03/2021 49 (H)  0 - 44 U/L Final   Alkaline Phosphatase 09/03/2021 72   38 - 126 U/L Final   Total Bilirubin 09/03/2021 0.8  0.3 - 1.2 mg/dL Final   GFR, Estimated 09/03/2021 >60  >60 mL/min Final   Comment: (NOTE) Calculated using the CKD-EPI Creatinine Equation (2021)    Anion gap 09/03/2021 9  5 - 15 Final   Performed at [facility], [place] [address], [place], [place] [address]   Hgb A1c MFr Bld 09/03/2021 7.7 (H)  4.8 - 5.6 % Final   Comment: (NOTE) Pre diabetes:          5.7%-6.4%  Diabetes:              >6.4%  Glycemic control for   <7.0% adults with diabetes    Mean Plasma Glucose 09/03/2021 174.29  mg/dL Final   Performed at [facility], [place] [address] [place], [place] [address]   Cholesterol 09/03/2021 204 (H)  0 - 200 mg/dL Final   Triglycerides 09/03/2021 165 (H)  <150 mg/dL Final   HDL 09/03/2021 41  >40 mg/dL Final   Total CHOL/HDL Ratio 09/03/2021 5.0  RATIO Final   VLDL 09/03/2021 33  0 - 40 mg/dL Final   LDL Cholesterol 09/03/2021 130 (H)  0 - 99 mg/dL Final   Comment:        Total Cholesterol/HDL:CHD Risk Coronary Heart Disease Risk Table                     Men   Women  1/2 Average Risk   3.4   3.3  Average Risk       5.0   4.4  2 X Average Risk   9.6   7.1  3 X Average Risk  23.4   11.0        Use the calculated Patient Ratio above and the CHD Risk Table to determine the patient's CHD Risk.        ATP III CLASSIFICATION (LDL):  <100     mg/dL   Optimal  100-129  mg/dL   Near or Above                    Optimal  130-159  mg/dL   Borderline  160-189  mg/dL   High  >190     mg/dL   Very High Performed at [place] [address], [place], [place] [address]    TSH 09/03/2021 6.299 (H)  0.350 - 4.500 uIU/mL Final   Comment: Performed by a 3rd Generation assay with a functional sensitivity of <=0.01 uIU/mL. Performed at [facility], [place] [address] [place], [place] [address]  Glucose-Capillary 09/03/2021 160 (H)  70 - 99 mg/dL Final   Glucose reference  range applies only to samples taken after fasting for at least 8 hours.   Comment 1 09/03/2021 Notify RN   Final  Admission on 09/01/2021, Discharged on 09/02/2021  Component Date Value Ref Range Status   SARS Coronavirus 2 by RT PCR 09/01/2021 NEGATIVE  NEGATIVE Final   Comment: (NOTE) SARS-CoV-2 target nucleic acids are NOT DETECTED.  The SARS-CoV-2 RNA is generally detectable in upper respiratory specimens during the acute phase of infection. The lowest concentration of SARS-CoV-2 viral copies this assay can detect is 138 copies/mL. A negative result does not preclude SARS-Cov-2 infection and should not be used as the sole basis for treatment or other patient management decisions. A negative result may occur with  improper specimen collection/handling, submission of specimen other than nasopharyngeal swab, presence of viral mutation(s) within the areas targeted by this assay, and inadequate number of viral copies(<138 copies/mL). A negative result must be combined with clinical observations, patient history, and epidemiological information. The expected result is Negative.  Fact Sheet for Patients:  EntrepreneurPulse.com.au  Fact Sheet for Healthcare Providers:  IncredibleEmployment.be  This test is no                          t yet approved or cleared by the [place] FDA and  has been authorized for detection and/or diagnosis of SARS-CoV-2 by FDA under an Emergency Use Authorization (EUA). This EUA will remain  in effect (meaning this test can be used) for the duration of the COVID-19 declaration under Section 564(b)(1) of the Act, 21 U.S.C.section 360bbb-3(b)(1), unless the authorization is terminated  or revoked sooner.       Influenza A by PCR 09/01/2021 NEGATIVE  NEGATIVE Final   Influenza B by PCR 09/01/2021 NEGATIVE  NEGATIVE Final   Comment: (NOTE) The Xpert Xpress SARS-CoV-2/FLU/RSV plus assay is intended as an aid in the  diagnosis of influenza from Nasopharyngeal swab specimens and should not be used as a sole basis for treatment. Nasal washings and aspirates are unacceptable for Xpert Xpress SARS-CoV-2/FLU/RSV testing.  Fact Sheet for Patients: EntrepreneurPulse.com.au  Fact Sheet for Healthcare Providers: IncredibleEmployment.be  This test is not yet approved or cleared by the [place] FDA and has been authorized for detection and/or diagnosis of SARS-CoV-2 by FDA under an Emergency Use Authorization (EUA). This EUA will remain in effect (meaning this test can be used) for the duration of the COVID-19 declaration under Section 564(b)(1) of the Act, 21 U.S.C. section 360bbb-3(b)(1), unless the authorization is terminated or revoked.  Performed at [facility], [place] [address] [place], [place] [address]    Sodium 09/01/2021 134 (L)  135 - 145 mmol/L Final   Potassium 09/01/2021 3.0 (L)  3.5 - 5.1 mmol/L Final   Chloride 09/01/2021 101  98 - 111 mmol/L Final   CO2 09/01/2021 18 (L)  22 - 32 mmol/L Final   Glucose, Bld 09/01/2021 328 (H)  70 - 99 mg/dL Final   Glucose reference range applies only to samples taken after fasting for at least 8 hours.   BUN 09/01/2021 14  6 - 20 mg/dL Final   Creatinine, Ser 09/01/2021 0.90  0.44 - 1.00 mg/dL Final   Calcium 09/01/2021 9.2  8.9 - 10.3 mg/dL Final   Total Protein 09/01/2021 7.6  6.5 - 8.1 g/dL Final   Albumin 09/01/2021 4.4  3.5 - 5.0 g/dL Final  AST 09/01/2021 55 (H)  15 - 41 U/L Final   ALT 09/01/2021 54 (H)  0 - 44 U/L Final   Alkaline Phosphatase 09/01/2021 76  38 - 126 U/L Final   Total Bilirubin 09/01/2021 1.1  0.3 - 1.2 mg/dL Final   GFR, Estimated 09/01/2021 >60  >60 mL/min Final   Comment: (NOTE) Calculated using the CKD-EPI Creatinine Equation (2021)    Anion gap 09/01/2021 15  5 - 15 Final   Performed at [facility], [place] [address], [place], [place]  [address]   Alcohol, Ethyl (B) 09/01/2021 <10  <10 mg/dL Final   Comment: (NOTE) Lowest detectable limit for serum alcohol is 10 mg/dL.  For medical purposes only. Performed at [facility], [place] [address], [place],  [address]    Opiates 09/01/2021 NONE DETECTED  NONE DETECTED Final   Cocaine 09/01/2021 NONE DETECTED  NONE DETECTED Final   Benzodiazepines 09/01/2021 NONE DETECTED  NONE DETECTED Final   Amphetamines 09/01/2021 NONE DETECTED  NONE DETECTED Final   Tetrahydrocannabinol 09/01/2021 POSITIVE (A)  NONE DETECTED Final   Barbiturates 09/01/2021 NONE DETECTED  NONE DETECTED Final   Comment: (NOTE) DRUG SCREEN FOR MEDICAL PURPOSES ONLY.  IF CONFIRMATION IS NEEDED FOR ANY PURPOSE, NOTIFY LAB WITHIN 5 DAYS.  LOWEST DETECTABLE LIMITS FOR URINE DRUG SCREEN Drug Class                     Cutoff (ng/mL) Amphetamine and metabolites    1000 Barbiturate and metabolites    200 Benzodiazepine                 [id number] Tricyclics and metabolites     300 Opiates and metabolites        300 Cocaine and metabolites        300 THC                            50 Performed at [facility], [place] [address] [place], [place] [address]    WBC 09/01/2021 6.7  4.0 - 10.5 K/uL Final   RBC 09/01/2021 4.57  3.87 - 5.11 MIL/uL Final   Hemoglobin 09/01/2021 14.5  12.0 - 15.0 g/dL Final   HCT 09/01/2021 41.0  36.0 - 46.0 % Final   MCV 09/01/2021 89.7  80.0 - 100.0 fL Final   MCH 09/01/2021 31.7  26.0 - 34.0 pg Final   MCHC 09/01/2021 35.4  30.0 - 36.0 g/dL Final   RDW 09/01/2021 12.7  11.5 - 15.5 % Final   Platelets 09/01/2021 276  150 - 400 K/uL Final   nRBC 09/01/2021 0.0  0.0 - 0.2 % Final   Neutrophils Relative % 09/01/2021 67  % Final   Neutro Abs 09/01/2021 4.5  1.7 - 7.7 K/uL Final   Lymphocytes Relative 09/01/2021 24  % Final   Lymphs Abs 09/01/2021 1.6  0.7 - 4.0 K/uL Final   Monocytes Relative 09/01/2021 7  % Final   Monocytes Absolute 09/01/2021  0.5  0.1 - 1.0 K/uL Final   Eosinophils Relative 09/01/2021 2  % Final   Eosinophils Absolute 09/01/2021 0.1  0.0 - 0.5 K/uL Final   Basophils Relative 09/01/2021 0  % Final   Basophils Absolute 09/01/2021 0.0  0.0 - 0.1 K/uL Final   Immature Granulocytes 09/01/2021 0  % Final   Abs Immature Granulocytes 09/01/2021 0.01  0.00 - 0.07 K/uL Final   Performed  at [facility], [address], [place], [place] [address]   I-stat hCG, quantitative 09/01/2021 <5.0  <5 mIU/mL Final   Comment 3 09/01/2021          Final   Comment:   GEST. AGE      CONC.  (mIU/mL)   <=1 WEEK        5 - 50     2 WEEKS       50 - 500     3 WEEKS       100 - 10,000     4 WEEKS     1,000 - 30,000        FEMALE AND NON-PREGNANT FEMALE:     LESS THAN 5 mIU/mL    Magnesium 09/01/2021 1.8  1.7 - 2.4 mg/dL Final   Performed at [facility], [address], [place], [place] [address]   Acetaminophen (Tylenol), Serum 09/01/2021 <10 (L)  10 - 30 ug/mL Final   Comment: (NOTE) Therapeutic concentrations vary significantly. A range of 10-30 ug/mL  may be an effective concentration for many patients. However, some  are best treated at concentrations outside of this range. Acetaminophen concentrations >150 ug/mL at 4 hours after ingestion  and >50 ug/mL at 12 hours after ingestion are often associated with  toxic reactions.  Performed at [facility], [address] [place], [place] [address]    Salicylate Lvl 09/01/2021 <7.0 (L)  7.0 - 30.0 mg/dL Final   Performed at [facility], [address], [place], [place] [address]   Acetaminophen (Tylenol), Serum 09/01/2021 <10 (L)  10 - 30 ug/mL Final   Comment: (NOTE) Therapeutic concentrations vary significantly. A range of 10-30 ug/mL  may be an effective concentration for many patients. However, some  are best treated at concentrations outside of this range. Acetaminophen concentrations >150 ug/mL  at 4 hours after ingestion  and >50 ug/mL at 12 hours after ingestion are often associated with  toxic reactions.  Performed at [facility], [address] [place], [place] [address]    FIO2 09/01/2021 21.00   Final   pH, Ven 09/01/2021 7.423  7.250 - 7.430 Final   pCO2, Ven 09/01/2021 33.8 (L)  44.0 - 60.0 mmHg Final   pO2, Ven 09/01/2021 56.7 (H)  32.0 - 45.0 mmHg Final   Bicarbonate 09/01/2021 21.7  20.0 - 28.0 mmol/L Final   Acid-base deficit 09/01/2021 1.6  0.0 - 2.0 mmol/L Final   O2 Saturation 09/01/2021 89.4  % Final   Patient temperature 09/01/2021 98.6   Final   Performed at [facility], [address], [place], [place] [address]   Color, Urine 09/01/2021 STRAW (A)  YELLOW Final   APPearance 09/01/2021 CLEAR  CLEAR Final   Specific Gravity, Urine 09/01/2021 1.005  1.005 - 1.030 Final   pH 09/01/2021 7.0  5.0 - 8.0 Final   Glucose, UA 09/01/2021 >=500 (A)  NEGATIVE mg/dL Final   Hgb urine dipstick 09/01/2021 NEGATIVE  NEGATIVE Final   Bilirubin Urine 09/01/2021 NEGATIVE  NEGATIVE Final   Ketones, ur 09/01/2021 20 (A)  NEGATIVE mg/dL Final   Protein, ur 11/94/1740 NEGATIVE  NEGATIVE mg/dL Final   Nitrite 81/44/8185 NEGATIVE  NEGATIVE Final   Leukocytes,Ua 09/01/2021 NEGATIVE  NEGATIVE Final   WBC, UA 09/01/2021 0-5  0 - 5 WBC/hpf Final   Bacteria, UA 09/01/2021 RARE (A)  NONE SEEN Final   Squamous Epithelial / LPF 09/01/2021 0-5  0 - 5 Final   Performed at [facility], [place] [address], [place], [place] [address]   MRSA by PCR Next Gen 09/01/2021 NOT DETECTED  NOT DETECTED Final   Comment: (NOTE) The GeneXpert MRSA Assay (FDA approved for NASAL specimens only), is one component of a comprehensive MRSA colonization surveillance program. It is not intended to diagnose MRSA infection nor to guide or monitor treatment for MRSA infections. Test performance is not FDA approved in patients less than 11  years old. Performed at [facility], [place] [address] [place], [place] [address]    HIV Screen 4th Generation wRfx 09/02/2021 Non Reactive  Non Reactive Final   Performed at [facility], [place] [address] [place], [place] [address]   Sodium 09/02/2021 137  135 - 145 mmol/L Final   Potassium 09/02/2021 4.0  3.5 - 5.1 mmol/L Final   DELTA CHECK NOTED   Chloride 09/02/2021 111  98 - 111 mmol/L Final   CO2 09/02/2021 23  22 - 32 mmol/L Final   Glucose, Bld 09/02/2021 124 (H)  70 - 99 mg/dL Final   Glucose reference range applies only to samples taken after fasting for at least 8 hours.   BUN 09/02/2021 7  6 - 20 mg/dL Final   Creatinine, Ser 09/02/2021 0.72  0.44 - 1.00 mg/dL Final   Calcium 09/02/2021 8.0 (L)  8.9 - 10.3 mg/dL Final   Total Protein 09/02/2021 5.7 (L)  6.5 - 8.1 g/dL Final   Albumin 09/02/2021 3.3 (L)  3.5 - 5.0 g/dL Final   AST 09/02/2021 33  15 - 41 U/L Final   ALT 09/02/2021 40  0 - 44 U/L Final   Alkaline Phosphatase 09/02/2021 58  38 - 126 U/L Final   Total Bilirubin 09/02/2021 0.8  0.3 - 1.2 mg/dL Final   GFR, Estimated 09/02/2021 >60  >60 mL/min Final   Comment: (NOTE) Calculated using the CKD-EPI Creatinine Equation (2021)    Anion gap 09/02/2021 3 (L)  5 - 15 Final   Performed at [facility], [place] [address], [place], [place] [address]   WBC 09/02/2021 5.2  4.0 - 10.5 K/uL Final   RBC 09/02/2021 3.68 (L)  3.87 - 5.11 MIL/uL Final   Hemoglobin 09/02/2021 11.6 (L)  12.0 - 15.0 g/dL Final   HCT 09/02/2021 34.9 (L)  36.0 - 46.0 % Final   MCV 09/02/2021 94.8  80.0 - 100.0 fL Final   MCH 09/02/2021 31.5  26.0 - 34.0 pg Final   MCHC 09/02/2021 33.2  30.0 - 36.0 g/dL Final   RDW 09/02/2021 13.1  11.5 - 15.5 % Final   Platelets 09/02/2021 184  150 - 400 K/uL Final   nRBC 09/02/2021 0.0  0.0 - 0.2 % Final   Performed at [facility], [place] [address], [place], [place] [address]   Glucose-Capillary  09/01/2021 176 (H)  70 - 99 mg/dL Final   Glucose reference range applies only to samples taken after fasting for at least 8 hours.   Comment 1 09/01/2021 Notify RN   Final   Comment 2 09/01/2021 Document in Chart   Final   Glucose-Capillary 09/01/2021 143 (H)  70 - 99 mg/dL Final   Glucose reference range applies only to samples taken after fasting for at least 8 hours.   Glucose-Capillary 09/02/2021 147 (H)  70 - 99 mg/dL Final   Glucose reference range applies only to samples taken after fasting for at least  8 hours.   Glucose-Capillary 09/02/2021 126 (H)  70 - 99 mg/dL Final   Glucose reference range applies only to samples taken after fasting for at least 8 hours.   Comment 1 09/02/2021 Notify RN   Final   Comment 2 09/02/2021 Document in Chart   Final    Blood Alcohol level:  Lab Results  Component Value Date   ETH <10 12/24/2021   ETH <10 [id number]    Metabolic Disorder Labs: Lab Results  Component Value Date   HGBA1C 7.6 (H) 10/09/2021   MPG 171.42 10/09/2021   MPG 174.29 09/03/2021   No results found for: PROLACTIN Lab Results  Component Value Date   CHOL 213 (H) 12/26/2021   TRIG 185 (H) 12/26/2021   HDL 50 12/26/2021   CHOLHDL 4.3 12/26/2021   VLDL 37 12/26/2021   LDLCALC 126 (H) 12/26/2021   LDLCALC 103 (H) 10/09/2021    Therapeutic Lab Levels: No results found for: LITHIUM No results found for: VALPROATE No components found for:  CBMZ  Physical Findings   AIMS    Flowsheet Row Admission (Discharged) from 10/02/2021 in [facility] Admission (Discharged) from 09/02/2021 in [facility] Admission (Discharged) from 01/12/2021 in [facility]  AIMS Total Score 0 0 0      AUDIT    Flowsheet Row Admission (Discharged) from 05/30/2021 in [facility] Admission (Discharged) from 01/12/2021 in [facility]  Alcohol Use Disorder Identification Test Final Score (AUDIT) 1 1      PHQ2-9    Flowsheet Row ED from 10/01/2021 in [facility] DEPT  PHQ-2 Total Score 0      Flowsheet Row ED from 12/26/2021 in [facility] ED from 12/24/2021 in [facility] DEPT ED from 12/17/2021 in [facility] High Risk No Risk No Risk        Musculoskeletal  Strength & Muscle Tone: within normal limits Gait & Station: normal Patient leans: N/A  Psychiatric Specialty Exam  Presentation  General Appearance: Appropriate for Environment  Eye Contact:Fair  Speech:Clear and Coherent  Speech Volume:Normal  Handedness:Right   Mood and Affect  Mood:Depressed  Affect:Congruent   Thought Process  Thought Processes:Coherent  Descriptions of Associations:Intact  Orientation:Full (Time, Place and Person)  Thought Content:Logical  Diagnosis of Schizophrenia or Schizoaffective disorder in past: No    Hallucinations:Hallucinations: None  Ideas of Reference:None  Suicidal Thoughts:Suicidal Thoughts: No  Homicidal Thoughts:Homicidal Thoughts: No   Sensorium  Memory:Immediate Fair; Remote Fair; Recent Fair  Judgment:Fair  Insight:Fair   Executive Functions  Concentration:Fair  Attention Span:Fair  [place]   Psychomotor Activity  Psychomotor Activity:Psychomotor Activity: Normal   Assets  Assets:Communication Skills; Desire for Improvement; Physical Health; Leisure Time   Sleep  Sleep:Sleep: Fair   No data recorded  Physical Exam  Physical Exam Constitutional:      Appearance: Normal appearance.  HENT:     Head: Normocephalic and atraumatic.     Nose: Nose normal.  Eyes:     Conjunctiva/sclera: Conjunctivae normal.  Cardiovascular:     Rate and Rhythm: Normal rate.  Pulmonary:      Effort: Pulmonary effort is normal.  Musculoskeletal:        General: Normal range of motion.     Cervical back: Normal range of motion.  Neurological:     Mental Status: She is alert and oriented to person, place, and time.   Review of Systems  Constitutional: Negative.   HENT: Negative.    Eyes: Negative.   Respiratory: Negative.    Cardiovascular: Negative.   Gastrointestinal: Negative.   Genitourinary: Negative.   Musculoskeletal: Negative.   Skin: Negative.   Neurological: Negative.   Endo/Heme/Allergies: Negative.   Blood pressure 104/81, pulse 82, temperature (!) 97.1 F (36.2 C), temperature source Temporal, resp. rate 18, SpO2 99 %. There is no height or weight on file to calculate BMI.  Treatment Plan Summary: Patient admitted to the [facility] due to suicidal ideations with a plan.   MDD Patient refusing medication management  Psycho-therapy  EKG reviewed-QTC 467. Will restart home medication albuterol inhaler.   Dispo-ongoing patient will need to establish outpatient psychiatry and counseling     Marissa Calamity, NP 12/28/2021 9:53 AM

## 2021-12-28 NOTE — ED Notes (Signed)
Lying in bed awake at the onset of the shift ,alert and oriented x 4 , reports feeling tired and with low energy and had been feeling depressed throughout the day , denies SI / HI and AVH . Thought process is organized and without paranoid or delusional content. Will continue to monitor for safety

## 2021-12-28 NOTE — ED Notes (Signed)
Pt given crackers and juice

## 2021-12-28 NOTE — ED Notes (Signed)
Pt currently laying quietly in bed, Breathing even and unlabored.  No distress noted. Will monitor for safety.  °

## 2021-12-28 NOTE — ED Notes (Signed)
Pt to bathroom then in bed awake , voicing no complaints at this time , denies SI / HI or AVH . Will continue to monitor for safety

## 2021-12-28 NOTE — ED Notes (Signed)
Pt currently in Day room  eating breakfast.

## 2021-12-28 NOTE — ED Notes (Signed)
Pt is in the bed sleeping. Respirations are even and unlabored. No acute distress noted. Will continue to monitor for safety. 

## 2021-12-29 LAB — GLUCOSE, CAPILLARY
Glucose-Capillary: 237 mg/dL — ABNORMAL HIGH (ref 70–99)
Glucose-Capillary: 187 mg/dL — ABNORMAL HIGH (ref 70–99)

## 2021-12-29 NOTE — ED Provider Notes (Signed)
FBC/OBS ASAP Discharge Summary  Date and Time: 12/29/2021 10:36 AM  Name: Erika Rhodes  MRN:  [id number]   Discharge Diagnoses:  Final diagnoses:  MDD (major depressive disorder), recurrent severe, without psychosis ([place])  Suicidal ideation    Subjective: "I don't feel suicidal anymore."   Stay Summary: Erika Rhodes is a 41 year old female with psychiatric history of depression, psychosis, and PTSD. Patient presented voluntarily to [facility] for walking assessment. Patient presented with chief complaint of worsening depression and suicidal ideation with plan to walk into traffic.   Patient was admitted on 12/26/21 to the facility based crisis for mood stabilization and safety. During patient's stay at the [facility] she was offered medication management but declined stating that she was only interested in therapy.  Patient seen and reevaluated face-to-face by this provider, chart reviewed and case discussed with Erika Rhodes. On evaluation, patient is alert and oriented x4. Her thought process is logical and relevant. Her speech is clear and coherent. Her mood is euthymic and affect is congruent.  Patient states, "I don't feel suicidal anymore." When asked what has changed in the context of her no longer having suicidal ideations, she states "just getting warmth and food."She states that her boyfriend called her and told her that he is going to remove the restraining order and that she can come back home. She states that he told her that he was sorry for the consequences that have occurred.  Patient denies suicidal ideations. Patient denies homicidal ideations. Patient denies auditory or visual hallucinations. There is no objective evidence that the patient is currently responding to internal or external stimuli. Patient denies depressive and anxiety symptoms. Patient reports fair sleep last night.  Patient reports a fair appetite.  I discussed with the patient the importance of ongoing therapy for  treatment of mood disorder. I recommended that the patient follow up here at the [facility] for therapy. Patient states that she will look in to following up upstairs [facility] outpatient) for therapy. CSW met with patient to discuss outpatient services for follow up. Patient has a hx of DM and asthma. Patient encouraged to follow up with the Erika Rhodes for all medication problems.     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 Erika Rhodes 10/02/21:Prescribed Prolixn 5 mg TID. [place] [facility] 09/02/21: Cymbalta 30 mg daily [facility] 05/30/2021: Seroquel 400 mg QHS and Cymbalta 30 mg daily   Past Medical History:  Past Medical History:  Diagnosis Date   Allergic reaction    Anxiety    Asthma    Depression    DM (diabetes mellitus), gestational    Migraines    Rheumatoid arthritis ([place]) 2016   Diagnosed Dr.  Lamarr Lulas; has been treated with MTX, Humira, etc, but made her sick.  Was taking CBD oil and stopped as tested positive in a drug screen for work.    Past Surgical History:  Procedure Laterality Date   ABDOMINAL HYSTERECTOMY  2017   Fibroids; Both ovaries intact still   BACK SURGERY     CESAREAN SECTION  2009   CHOLECYSTECTOMY  2010   laparoscopic   TONSILLECTOMY     Family History:  Family History  Problem Relation Age of Onset   Arthritis Mother        Rheumatoid   Depression Daughter        and anxiety   Allergies Son    Family Psychiatric History:  daughter -depression and anxiety  Social History:  Social History   Substance and Sexual Activity  Alcohol Use Yes   Alcohol/week: 2.0 standard drinks   Types: 2 Glasses of wine per week   Comment: occ     Social History   Substance and Sexual Activity  Drug Use Yes   Types: Marijuana   Comment: "cannabis delta 10"    Social History   Socioeconomic History   Marital  status: Single    Spouse name: Not on file   Number of children: 5   Years of education: 14   Highest education level: Associate degree: academic program  Occupational History   Not on file  Tobacco Use   Smoking status: Never   Smokeless tobacco: Never  Vaping Use   Vaping Use: Some days  Substance and Sexual Activity   Alcohol use: Yes    Alcohol/week: 2.0 standard drinks    Types: 2 Glasses of wine per week    Comment: occ   Drug use: Yes    Types: Marijuana    Comment: "cannabis delta 10"   Sexual activity: Yes    Birth control/protection: Surgical  Other Topics Concern   Not on file  Social History Narrative   Lives with current boyfriend   See history of present illness for more history 04/22/2020   Social Determinants of Health   Financial Resource Strain: Not on file  Food Insecurity: Not on file  Transportation Needs: Not on file  Physical Activity: Not on file  Stress: Not on file  Social Connections: Not on file   SDOH:  SDOH Screenings   Alcohol Screen: Low Risk    Last Alcohol Screening Score (AUDIT): 2  Depression (PHQ2-9): Low Risk    PHQ-2 Score: 0  Financial Resource Strain: Not on file  Food Insecurity: Not on file  Housing: Not on file  Physical Activity: Not on file  Social Connections: Not on file  Stress: Not on file  Tobacco Use: Low Risk    Smoking Tobacco Use: Never   Smokeless Tobacco Use: Never   Passive Exposure: Not on file  Transportation Needs: Not on file    Tobacco Cessation:  Prescription not provided because: declined   Current Medications:  Current Facility-Administered Medications  Medication Dose Route Frequency Provider Last Rate Last Admin   acetaminophen (TYLENOL) tablet 650 mg  650 mg Oral Q6H PRN Erika Rhodes   650 mg at 12/29/21 0933   albuterol (VENTOLIN HFA) 108 (90 Base) MCG/ACT inhaler 1-2 puff  1-2 puff Inhalation Q6H PRN Erika Rhodes       alum & mag hydroxide-simeth (MAALOX/MYLANTA)  200-200-20 MG/5ML suspension 30 mL  30 mL Oral Q4H PRN Erika Rhodes       hydrOXYzine (ATARAX) tablet 25 mg  25 mg Oral TID PRN Erika Rhodes   25 mg at 12/29/21 0934   insulin aspart (novoLOG) injection 0-15 Units  0-15 Units Subcutaneous TID WC Erika Rhodes   3 Units at 12/28/21 1702   insulin aspart (novoLOG) injection 0-5 Units  0-5 Units Subcutaneous QHS Erika Rhodes   3 Units at 12/28/21 2139   magnesium hydroxide (MILK OF MAGNESIA) suspension 30 mL  30 mL Oral Daily PRN Erika Rhodes       traZODone (DESYREL) tablet 50 mg  50 mg Oral QHS PRN Erika Rhodes   50 mg at 12/28/21 2144   Current  Outpatient Medications  Medication Sig Dispense Refill   albuterol (VENTOLIN HFA) 108 (90 Base) MCG/ACT inhaler Inhale 1-2 puffs into the lungs every 4 (four) hours as needed for wheezing or shortness of breath. 1 each 0   melatonin 3 MG TABS tablet Take 3 mg by mouth at bedtime.     montelukast (SINGULAIR) 10 MG tablet Take 10 mg by mouth at bedtime.     omeprazole (PRILOSEC) 20 MG capsule Take 20 mg by mouth daily.      PTA Medications: (Not in a [facility] admission)   Musculoskeletal  Strength & Muscle Tone: within normal limits Gait & Station: normal Patient leans: N/A  Psychiatric Specialty Exam  Presentation  General Appearance: Appropriate for Environment  Eye Contact:Fair  Speech:Clear and Coherent  Speech Volume:Normal  Handedness:Right   Mood and Affect  Mood:Euthymic  Affect:Congruent   Thought Process  Thought Processes:Coherent  Descriptions of Associations:Intact  Orientation:Full (Time, Place and Person)  Thought Content:Logical  Diagnosis of Schizophrenia or Schizoaffective disorder in past: No    Hallucinations:Hallucinations: None  Ideas of Reference:None  Suicidal Thoughts:Suicidal Thoughts: No  Homicidal Thoughts:Homicidal Thoughts: No   Sensorium  Memory:Immediate Fair; Recent Fair; Remote  Fair  Judgment:Fair  Insight:Fair   Executive Functions  Concentration:Fair  Attention Span:Fair  [place]   Psychomotor Activity  Psychomotor Activity:Psychomotor Activity: Normal   Assets  Assets:Communication Skills; Desire for Improvement; Financial Resources/Insurance; Housing; Intimacy; Leisure Time; Physical Health   Sleep  Sleep:Sleep: Fair Number of Hours of Sleep: 10   No data recorded  Physical Exam  Physical Exam Constitutional:      Appearance: Normal appearance.  HENT:     Head: Normocephalic and atraumatic.     Nose: Nose normal.  Eyes:     Conjunctiva/sclera: Conjunctivae normal.  Cardiovascular:     Rate and Rhythm: Normal rate.  Pulmonary:     Effort: Pulmonary effort is normal.  Musculoskeletal:     Cervical back: Normal range of motion.  Neurological:     Mental Status: She is alert and oriented to person, place, and time.   Review of Systems  Constitutional: Negative.   HENT: Negative.    Eyes: Negative.   Respiratory: Negative.    Cardiovascular: Negative.   Gastrointestinal: Negative.   Genitourinary: Negative.   Musculoskeletal: Negative.   Skin: Negative.   Neurological: Negative.   Endo/Heme/Allergies: Negative.   Blood pressure 98/60, pulse 86, temperature 98.9 F (37.2 C), temperature source Oral, resp. rate 18, SpO2 96 %. There is no height or weight on file to calculate BMI.  Demographic Factors:  Caucasian, Low socioeconomic status, and Unemployed  Loss Factors: Financial problems/change in socioeconomic status  Historical Factors: Prior suicide attempts and Impulsivity  Risk Reduction Factors:   Sense of responsibility to family and Living with another person, especially a relative  Continued Clinical Symptoms:  Alcohol/Substance Abuse/Dependencies Previous Psychiatric Diagnoses and Treatments  Cognitive Features That Contribute To Risk:  None    Suicide  Risk:  Minimal: No identifiable suicidal ideation.  Patients presenting with no risk factors but with morbid ruminations; may be classified as minimal risk based on the severity of the depressive symptoms  Plan Of Care/Follow-up recommendations:  Activity:  as tolerated   Follow-up Information     [facility]. Call.   Why: Please contact to establish primary care services and to ensure follow up for your medical condition. Be sure to have any discharge paperwork from  this encounter. Contact information: [place] [phone] [facility] up.   Specialty: Behavioral Health Why: Please go during walk-in hours to establish outpatient psychiatric services such as medication management and outpatient therapy.   Medication management walk in hours are Monday-Friday from 8:00am-11:00am. Please attempt to arrive by 7:15am to be seen in a timely manner. Patients are seen on a first come, first served basis.   Outpatient Therapy walk-in hours are Monday-Wednesday from 8:00am-until slots are filled. Please attempt to arrive by 7:15am to be seen in a timely manner. Patients are seen on a first come, first served basis.  Outpatient Therapy Friday hours are from 1:00pm-5:00pm. Please attempt to arrive by 12:15pm to be seen in a timely manner. Patients are seen on a first come, first served basis. Contact information: [place] [phone]               Disposition: discharge to self/home  Marissa Calamity, Rhodes 12/29/2021, 10:36 AM

## 2021-12-29 NOTE — ED Notes (Signed)
Pt ate some snacks

## 2021-12-29 NOTE — Progress Notes (Signed)
Erika Rhodes received her AVS, questions answered and she retrieved her clothes. She decided to wait for her boyfriend at [name]'. She said they talked earlier and made the above plans. She was escorted to the lobby without incident.

## 2021-12-29 NOTE — Discharge Instructions (Signed)
Discharge recommendations:  ?Please see information for follow-up appointment with psychiatry and therapy. ?Please follow up with your primary care provider for all medical related needs.  ? ?Therapy: We recommend that patient participate in individual therapy to address mental health concerns. ? ?Medications: The parent/guardian is to contact a medical professional and/or outpatient provider to address any new side effects that develop. Parent/guardian should update outpatient providers of any new medications and/or medication changes.  ? ?Safety:  ?The patient should abstain from use of illicit substances/drugs and abuse of any medications. ?If symptoms worsen or do not continue to improve or if the patient becomes actively suicidal or homicidal then it is recommended that the patient return to the closest hospital emergency department, the [facility], or call 911 for further evaluation and treatment. ?National Suicide Prevention Lifeline 1-800-SUICIDE or 1-800-273-8255. ? ?About 988 ?988 offers 24/7 access to trained crisis counselors who can help people experiencing mental health-related distress. People can call or text 988 or chat 988lifeline.org for themselves or if they are worried about a loved one who may need crisis support.  ? ? ?  ?

## 2021-12-29 NOTE — ED Notes (Addendum)
Appears to be resting quietly , eyes closed , respirations even and unlabored no distress noted. Will continue to monitor for safety °
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
## 2021-12-29 NOTE — Progress Notes (Signed)
Received Erika Rhodes this AM asleep in her bed, she woke up on her own, received breakfast before blood glucose check. Later she asked for Tylenol and Vistaril, her request was granted. She refused blood glucose check prior to lunch. She has been in her room throughout the morning except for meals. She is planning to discharge later after her boyfriend's work hours.

## 2022-02-06 ENCOUNTER — Emergency Department (HOSPITAL_BASED_OUTPATIENT_CLINIC_OR_DEPARTMENT_OTHER)
Admission: EM | Admit: 2022-02-06 | Discharge: 2022-02-06 | Disposition: A | Discharge status: Home / Self Care | Payer: Medicaid Other | Attending: Student | Admitting: Student

## 2022-02-06 ENCOUNTER — Other Ambulatory Visit: Payer: Self-pay

## 2022-02-06 ENCOUNTER — Encounter (HOSPITAL_BASED_OUTPATIENT_CLINIC_OR_DEPARTMENT_OTHER): Payer: Self-pay

## 2022-02-06 DIAGNOSIS — K029 Dental caries, unspecified: Secondary | ICD-10-CM | POA: Insufficient documentation

## 2022-02-06 MED ORDER — OXYCODONE HCL 5 MG PO TABS: 5.0000 mg | ORAL_TABLET | Freq: Three times a day (TID) | ORAL | 0 refills | Status: DC | PRN

## 2022-02-06 MED ORDER — OXYCODONE HCL 5 MG PO TABS
5.0000 mg | ORAL_TABLET | Freq: Once | ORAL | Status: AC
Start: 2022-02-06 — End: 2022-02-06
  Administered 2022-02-06: 5 mg via ORAL
  Filled 2022-02-06: qty 1

## 2022-02-06 MED ORDER — AMOXICILLIN-POT CLAVULANATE 875-125 MG PO TABS: 1.0000 | ORAL_TABLET | Freq: Two times a day (BID) | ORAL | 0 refills | Status: DC

## 2022-02-06 NOTE — Discharge Instructions (Addendum)
I have sent antibiotics as well and as the oxycodone we discussed to the pharmacy.  You should also take Tylenol and ibuprofen throughout the day for your pain and inflammation. ? ?Try and contact one of the dentists attached to these discharge papers. ?

## 2022-02-06 NOTE — ED Provider Notes (Addendum)
?[facility] EMERGENCY DEPARTMENT ?Provider Note ? ? ?CSN: [id number] ?Arrival date & time: 02/06/22  1840 ? ?  ? ?History ? ?Chief Complaint  ?Patient presents with  ? Dental Pain  ? ? ?Erika Rhodes is a 41 y.o. female presenting with right-sided dental pain that began yesterday.  Last night she noted the right side of her mouth began to become inflamed.  Denies any trouble breathing or swallowing.  Says that she main is painful.  No fever, endorses some chills.  Says that for the past 2 to 3 weeks the area has had an infected tooth.  Saw her dentist who says she needed to see an oral surgeon and she says she is unable to afford this. ? ?Home Medications ?Prior to Admission medications   ?Medication Sig Start Date End Date Taking? Authorizing Provider  ?amoxicillin-clavulanate (AUGMENTIN) 875-125 MG tablet Take 1 tablet by mouth every 12 (twelve) hours. 02/06/22  Yes Marlow Berenguer A, PA-C  ?oxyCODONE (ROXICODONE) 5 MG immediate release tablet Take 1 tablet (5 mg total) by mouth 3 (three) times daily as needed for severe pain. 02/06/22  Yes Fuquan Wilson A, PA-C  ?albuterol (VENTOLIN HFA) 108 (90 Base) MCG/ACT inhaler Inhale 1-2 puffs into the lungs every 4 (four) hours as needed for wheezing or shortness of breath. 01/15/21   Laveda Abbe, NP  ?melatonin 3 MG TABS tablet Take 3 mg by mouth at bedtime.    [provider]  ?montelukast (SINGULAIR) 10 MG tablet Take 10 mg by mouth at bedtime.    [provider]  ?omeprazole (PRILOSEC) 20 MG capsule Take 20 mg by mouth daily.    [provider]  ?   ? ?Allergies    ?Azithromycin   ? ?Review of Systems   ?Review of Systems ?See HPI ? ?Physical Exam ?Updated Vital Signs ?BP (!) 128/95 (BP Location: Left Arm)   Pulse (!) 107   Temp 98.9 ?F (37.2 ?C) (Oral)   Resp 16   Ht  (1.6 m)   Wt 81.6 kg   SpO2 98%   BMI 31.89 kg/m?  ?Physical Exam ?Vitals and nursing note reviewed.  ?Constitutional:   ?   Appearance: Normal  appearance.  ?HENT:  ?   Head: Normocephalic and atraumatic.  ?   Mouth/Throat:  ?   Mouth: Mucous membranes are moist.  ?   Pharynx: Oropharynx is clear.  ?   Comments: All teeth in very poor dentition.  Soft tissue swelling to the right side of her mouth, no palpable or visualized abscess.  Airway clear.  Tolerating secretions. ?Eyes:  ?   General: No scleral icterus. ?   Conjunctiva/sclera: Conjunctivae normal.  ?Pulmonary:  ?   Effort: Pulmonary effort is normal. No respiratory distress.  ?Skin: ?   Findings: No rash.  ?Neurological:  ?   Mental Status: She is alert.  ?Psychiatric:     ?   Mood and Affect: Mood normal.  ? ? ?ED Results / Procedures / Treatments   ?Labs ?(all labs ordered are listed, but only abnormal results are displayed) ?Labs Reviewed - No data to display ? ?EKG ?None ? ?Radiology ?No results found. ? ?Procedures ?Procedures  ? ?Medications Ordered in ED ?Medications  ?oxyCODONE (Oxy IR/ROXICODONE) immediate release tablet 5 mg (5 mg Oral Given 02/06/22 1922)  ? ? ?ED Course/ Medical Decision Making/ A&P ?  ?                        ?  Medical Decision Making ?Risk ?Prescription drug management. ? ? ?[age] year old female presenting with dental pain.  Saw her dentist and said she needed to see an oral surgeon however she cannot afford this. ? ?Physical exam revealing of teeth and poor dentition.  Nearly all teeth with caries.  No obvious abscess.  Airway clear, tolerating secretions.  Patient is afebrile today.  Will start on Augmentin and give her a few pills of oxycodone until she is able to follow-up with oral surgery/dentist on Monday.  She has been given dentist resources as well.  She and her husband are agreeable.  They will use Tylenol and ibuprofen for any mild to moderate pain and only use oxycodone for severe pain. ? ? ?Final Clinical Impression(s) / ED Diagnoses ?Final diagnoses:  ?Pain due to dental caries  ? ? ?Rx / DC Orders ?ED Discharge Orders   ? ?      Ordered  ?   amoxicillin-clavulanate (AUGMENTIN) 875-125 MG tablet  Every 12 hours       ? 02/06/22 1918  ?  oxyCODONE (ROXICODONE) 5 MG immediate release tablet  3 times daily PRN       ? 02/06/22 1926  ? ?  ?  ? ?  ? ?Results and diagnoses were explained to the patient. Return precautions discussed in full. Patient had no additional questions and expressed complete understanding. ? ? ?This chart was dictated using voice recognition software.  Despite best efforts to proofread,  errors can occur which can change the documentation meaning.  ?  ?Saddie BendersRedwine, Elizibeth Breau A, PA-C ?02/06/22 1941 ? ?Pharmacy closed prior to patient picking up medication.  Transferred to 24-hour [facility] and [facility] was called and told to cancel the oxycodone prescription. ?  ?Saddie BendersRedwine, Shahin Knierim A, PA-C ?02/06/22 2024 ? ?  ?Glendora ScoreKommor, Kaion Tisdale, MD ?02/06/22 2307 ? ?

## 2022-02-06 NOTE — ED Triage Notes (Signed)
Pt arrives ambulatory to ED with pain and swelling to right lower jaw states that she had a tooth break off. Pain for 3 days.  ?

## 2022-02-08 ENCOUNTER — Emergency Department (HOSPITAL_BASED_OUTPATIENT_CLINIC_OR_DEPARTMENT_OTHER)
Admission: EM | Admit: 2022-02-08 | Discharge: 2022-02-08 | Disposition: A | Discharge status: Home / Self Care | Payer: Self-pay | Attending: Emergency Medicine | Admitting: Emergency Medicine

## 2022-02-08 ENCOUNTER — Other Ambulatory Visit: Payer: Self-pay

## 2022-02-08 ENCOUNTER — Emergency Department (HOSPITAL_BASED_OUTPATIENT_CLINIC_OR_DEPARTMENT_OTHER): Payer: Self-pay

## 2022-02-08 ENCOUNTER — Encounter (HOSPITAL_BASED_OUTPATIENT_CLINIC_OR_DEPARTMENT_OTHER): Payer: Self-pay

## 2022-02-08 DIAGNOSIS — J45909 Unspecified asthma, uncomplicated: Secondary | ICD-10-CM | POA: Insufficient documentation

## 2022-02-08 DIAGNOSIS — R110 Nausea: Secondary | ICD-10-CM | POA: Insufficient documentation

## 2022-02-08 DIAGNOSIS — R6883 Chills (without fever): Secondary | ICD-10-CM | POA: Insufficient documentation

## 2022-02-08 DIAGNOSIS — K047 Periapical abscess without sinus: Secondary | ICD-10-CM | POA: Insufficient documentation

## 2022-02-08 DIAGNOSIS — E119 Type 2 diabetes mellitus without complications: Secondary | ICD-10-CM | POA: Insufficient documentation

## 2022-02-08 MED ORDER — SODIUM CHLORIDE 0.9 % IV SOLN
3.0000 g | Freq: Once | INTRAVENOUS | Status: AC
  Administered 2022-02-08: 3 g via INTRAVENOUS

## 2022-02-08 MED ORDER — OXYCODONE HCL 5 MG PO TABS
5.0000 mg | ORAL_TABLET | ORAL | Status: AC
  Administered 2022-02-08: 5 mg via ORAL
  Filled 2022-02-08: qty 1

## 2022-02-08 MED ORDER — OXYCODONE HCL 5 MG PO TABS: 5.0000 mg | ORAL_TABLET | Freq: Three times a day (TID) | ORAL | 0 refills | Status: DC | PRN

## 2022-02-08 MED ORDER — HYDROCODONE-ACETAMINOPHEN 5-325 MG PO TABS: 1.0000 | ORAL_TABLET | Freq: Once | ORAL | Status: DC

## 2022-02-08 MED ORDER — LORAZEPAM 1 MG PO TABS
1.0000 mg | ORAL_TABLET | Freq: Once | ORAL | Status: AC
  Administered 2022-02-08: 1 mg via ORAL
  Filled 2022-02-08: qty 1

## 2022-02-08 MED ORDER — OXYCODONE HCL 5 MG PO TABS: 5.0000 mg | ORAL_TABLET | ORAL | Status: DC

## 2022-02-08 MED ORDER — IOHEXOL 300 MG/ML  SOLN
100.0000 mL | Freq: Once | INTRAMUSCULAR | Status: AC | PRN
  Administered 2022-02-08: 75 mL via INTRAVENOUS

## 2022-02-08 NOTE — ED Notes (Signed)
Pt given discharge paperwork. Understood all forms.  ?

## 2022-02-08 NOTE — Discharge Instructions (Addendum)
Return to ED with any new symptoms such as fevers, nausea, vomiting ?Please pick up pain medication I have sent into your pharmacy ?Please follow-up with oral surgeon as we discussed ?Please continue taking Augmentin antibiotics ?Please read the attached informational guide concerning dental abscesses ?

## 2022-02-08 NOTE — ED Triage Notes (Signed)
Patient here POV from Home with Dental Pain. ? ?Patient states Swelling and Pain have been present sine Wednesday. Seen at another ED on Friday and given prescription for Augmentin which has been ineffective. ? ?No Known Fevers. Moderate Nausea. No Emesis. No Diarrhea.  ? ?NAD Noted during Triage. A&Ox4. GCS 15. Ambulatory. Oral Airway Intact. ?

## 2022-02-08 NOTE — ED Provider Notes (Signed)
?[facility] EMERGENCY DEPT ?Provider Note ? ? ?CSN: [id number] ?Arrival date & time: 02/08/22  1054 ? ?  ? ?History ? ?Chief Complaint  ?Patient presents with  ? Dental Pain  ? ? ?Erika Rhodes is a 41 y.o. female with medical history significant for asthma, depression, diabetes, rheumatoid arthritis.  Patient presents to ED for evaluation of dental pain.  Patient states that on 3/30, Thursday, she began experiencing right-sided lower dental pain.  Patient reports that the right side of her mouth became inflamed, described as painful.  Patient reports presenting to ER at this time and was placed on Augmentin as well as given pain medication.  Patient returns today with continued dental pain.  Patient states that she has been seen by oral surgeon in the past for another tooth that was infected and at that time the oral surgeon advised her that eventually the tooth in question today would need to be removed as well.  Patient states that she is unable to afford oral surgeon.  Patient endorsing some chills and nausea.  Patient denies any fevers, vomiting, diarrhea, trouble breathing, trouble swallowing, drooling or change in phonation. ? ? ?Dental Pain ?Associated symptoms: no drooling and no fever   ? ?  ? ?Home Medications ?Prior to Admission medications   ?Medication Sig Start Date End Date Taking? Authorizing Provider  ?albuterol (VENTOLIN HFA) 108 (90 Base) MCG/ACT inhaler Inhale 1-2 puffs into the lungs every 4 (four) hours as needed for wheezing or shortness of breath. 01/15/21   Laveda Abbe, NP  ?amoxicillin-clavulanate (AUGMENTIN) 875-125 MG tablet Take 1 tablet by mouth every 12 (twelve) hours. 02/06/22   Redwine, Madison A, PA-C  ?melatonin 3 MG TABS tablet Take 3 mg by mouth at bedtime.    [provider]  ?montelukast (SINGULAIR) 10 MG tablet Take 10 mg by mouth at bedtime.    [provider]  ?omeprazole (PRILOSEC) 20 MG capsule Take 20 mg by mouth daily.    [provider]  ?oxyCODONE (ROXICODONE) 5 MG immediate release tablet Take 1 tablet (5 mg total) by mouth 3 (three) times daily as needed for severe pain. 02/08/22   Al Decant, PA-C  ?   ? ?Allergies    ?Azithromycin   ? ?Review of Systems   ?Review of Systems  ?Constitutional:  Positive for chills. Negative for fever.  ?HENT:  Negative for drooling, trouble swallowing and voice change.   ?Respiratory:  Negative for shortness of breath.   ?Gastrointestinal:  Positive for nausea. Negative for diarrhea and vomiting.  ?All other systems reviewed and are negative. ? ?Physical Exam ?Updated Vital Signs ?BP 116/82 (BP Location: Right Arm)   Pulse 99   Temp (!) 97.5 ?F (36.4 ?C)   Resp 16   Ht 5\' 3"  (1.6 m)   Wt 81.6 kg   SpO2 100%   BMI 31.87 kg/m?  ?Physical Exam ?Vitals and nursing note reviewed.  ?Constitutional:   ?   General: She is not in acute distress. ?   Appearance: Normal appearance. She is not ill-appearing, toxic-appearing or diaphoretic.  ?HENT:  ?   Head: Normocephalic and atraumatic.  ?   Nose: Nose normal. No congestion.  ?   Mouth/Throat:  ?   Mouth: Mucous membranes are moist.  ?   Comments: All teeth have very poor dentition.  Soft tissue swelling to the right side of her mouth, no palpable or visualized abscess.  Airway clear.  Tolerating secretions.  Uvula midline.  No sign of RPA, PTA. ?Eyes:  ?   Extraocular Movements: Extraocular movements intact.  ?   Conjunctiva/sclera: Conjunctivae normal.  ?   Pupils: Pupils are equal, round, and reactive to light.  ?Cardiovascular:  ?   Rate and Rhythm: Normal rate and regular rhythm.  ?Pulmonary:  ?   Effort: Pulmonary effort is normal. No respiratory distress.  ?   Breath sounds: Normal breath sounds. No wheezing.  ?Abdominal:  ?   General: Abdomen is flat. Bowel sounds are normal.  ?   Palpations: Abdomen is soft.  ?   Tenderness: There is no abdominal tenderness.  ?Musculoskeletal:  ?   Cervical back: Normal range of motion and neck  supple. No tenderness.  ?Skin: ?   General: Skin is warm and dry.  ?   Capillary Refill: Capillary refill takes less than 2 seconds.  ?Neurological:  ?   Mental Status: She is alert and oriented to person, place, and time.  ? ? ?ED Results / Procedures / Treatments   ?Labs ?(all labs ordered are listed, but only abnormal results are displayed) ?Labs Reviewed - No data to display ? ?EKG ?None ? ?Radiology ?CT Maxillofacial W Contrast ? ?Result Date: 02/08/2022 ?CLINICAL DATA:  Right back to pain, rule out dental abscess EXAM: CT MAXILLOFACIAL WITH CONTRAST TECHNIQUE: Multidetector CT imaging of the maxillofacial structures was performed with intravenous contrast. Multiplanar CT image reconstructions were also generated. RADIATION DOSE REDUCTION: This exam was performed according to the departmental dose-optimization program which includes automated exposure control, adjustment of the mA and/or kV according to patient size and/or use of iterative reconstruction technique. CONTRAST:  75mL OMNIPAQUE IOHEXOL 300 MG/ML  SOLN COMPARISON:  10/06/2018 maxillofacial radiographs, CT head 09/24/2020 FINDINGS: Osseous: No acute fracture or mandibular dislocation. Poor dentition, with dental caries; periapical lucency about a right mandibular premolar (series 4, image 22), as well as less significant periapical lucency about the left mandibular canine. No definite osseous erosion or cortical breakthrough adjacent to the right mandibular premolar, however there is a subperiosteal abscess in this location, which measures up to 2.3 x 0.8 x 1.9 cm (AP x TR x CC) (series 2, image 23 and series 6, image 52), with significant surrounding enhancement and fat stranding. Orbits: Negative. No traumatic or inflammatory finding. Sinuses: Clear. Soft tissues: Subperiosteal abscess along the right mandible, which measures up to 2.3 x 0.8 x 1 point cm (series 2, image 23 and series 6, image 52), which demonstrates significant surrounding  enhancement and fat stranding. The soft tissues are otherwise unremarkable. Limited intracranial: No significant or unexpected finding. IMPRESSION: Subperiosteal abscess along the right mandible, adjacent to a right mandibular premolar with significant periapical lucency, most likely an odontogenic abscess. Electronically Signed   By: Wiliam Ke M.D.   On: 02/08/2022 13:50   ? ?Procedures ?Procedures  ? ?Medications Ordered in ED ?Medications  ?LORazepam (ATIVAN) tablet 1 mg (1 mg Oral Given 02/08/22 1234)  ?iohexol (OMNIPAQUE) 300 MG/ML solution 100 mL (75 mLs Intravenous Contrast Given 02/08/22 1300)  ?Ampicillin-Sulbactam (UNASYN) 3 g in sodium chloride 0.9 % 100 mL IVPB (0 g Intravenous Stopped 02/08/22 1500)  ?oxyCODONE (Oxy IR/ROXICODONE) immediate release tablet 5 mg (5 mg Oral Given 02/08/22 1440)  ? ? ?ED Course/ Medical Decision Making/ A&P ?  ?                        ?Medical Decision Making ?Amount and/or Complexity of Data Reviewed ?Radiology: ordered. ? ?  Risk ?Prescription drug management. ? ? ?41 year old female presents ED for evaluation of dental pain.  Please see HPI for further details. ? ?On examination, the patient is afebrile, nontachycardic, nonhypoxic.  Patient lung sounds clear bilaterally, she is tolerating secretions appropriately, she is not drooling, her uvula is midline, her airway is not obtunded.  There is no sign of RPA, PTA on examination.  Patient does have soft tissue swelling to right side of face however there is no visualized abscess in need of drainage. ? ?Patient worked up utilizing following imaging studies interpreted by me personally: ?- CT maxillofacial study shows subperiosteal otogenic abscess along right mandible.  Patient treated with 3 g Unasyn IV. ? ?At this time, after discussing with my attending Dr. Karene FryLawsing, we feel this patient is stable for discharge home.  Patient will be encouraged to continue taking Augmentin antibiotics.  Patient encouraged to follow-up with  oral surgeon for tooth removal.  Patient has been provided with extra pain medication.  Patient has been provided with return precautions and she voiced understanding.  Patient all her questions answered her satisfaction. ? ?P

## 2022-06-01 IMAGING — CT CT NECK W/ CM
4 series · 15 of 33 positions shown, 18 images · IV contrast (Omnipaque)
Comparison: None.

CLINICAL DATA: Soft tissue swelling neck.  Rule out infection.

EXAM:
CT NECK WITH CONTRAST
TECHNIQUE: Multidetector CT imaging of the neck was performed using the
standard protocol following the bolus administration of intravenous
contrast.
CONTRAST:  75mL OMNIPAQUE IOHEXOL 300 MG/ML  SOLN

[Series 3: axial neck · axial · 0.66mm/px · z∈[-201,-17]mm · 5 of 139 slices shown, 7 images]
[im 24/139  soft-tissue]
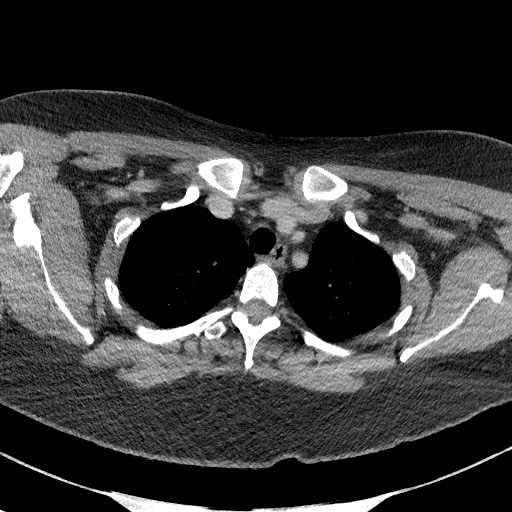
[im 24/139  bone]
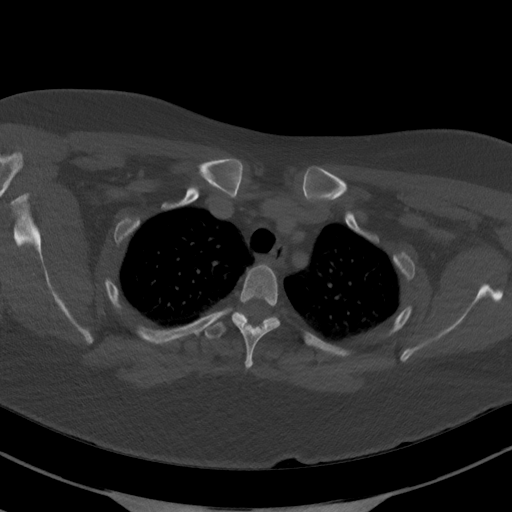
[im 47/139  bone]
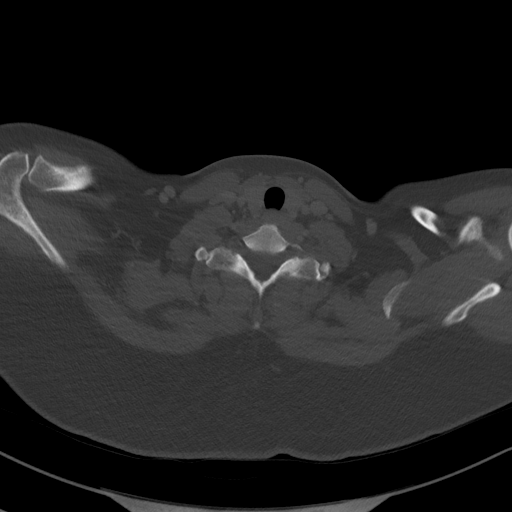
[im 70/139  bone]
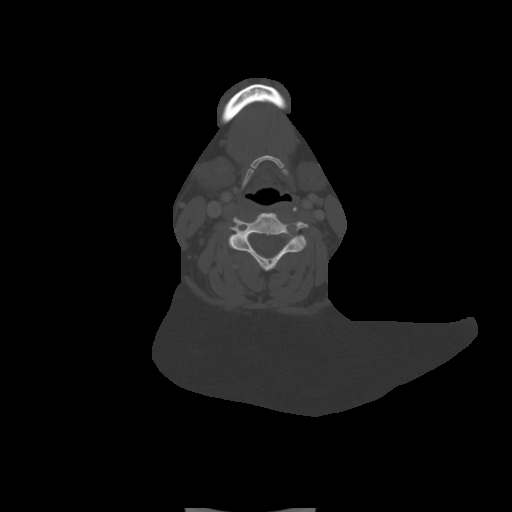
[im 93/139  bone]
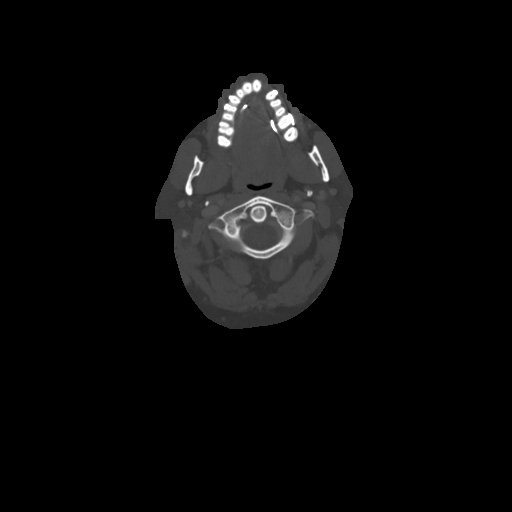
[im 116/139  soft-tissue]
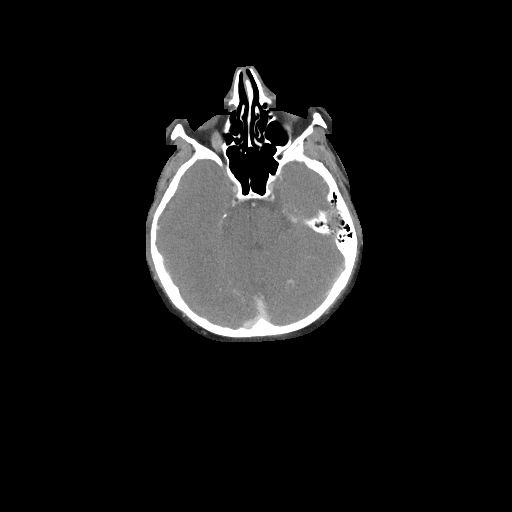
[im 116/139  bone]
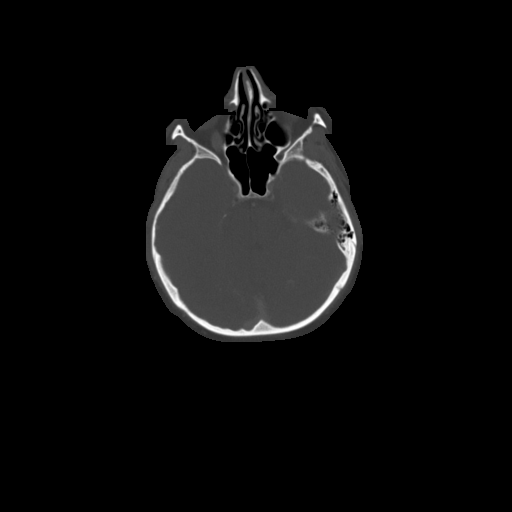

[Series 4: sag neck · sagittal · 0.57mm/px · 5 of 120 slices shown, 6 images]
[im 40/120  bone]
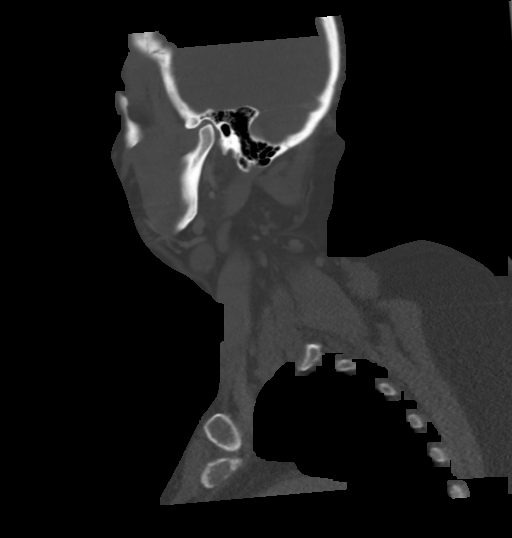
[im 50/120  bone]
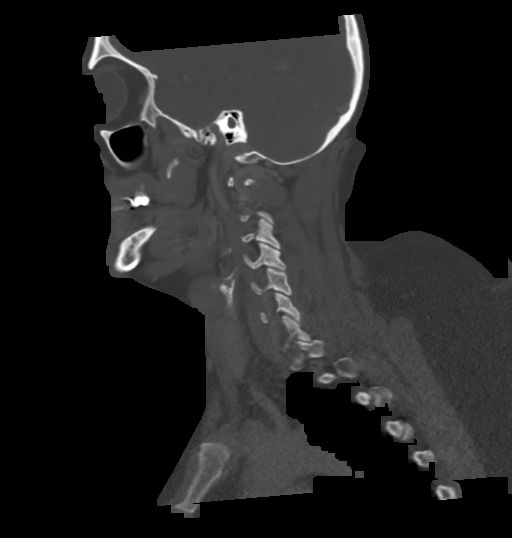
[im 60/120  soft-tissue]
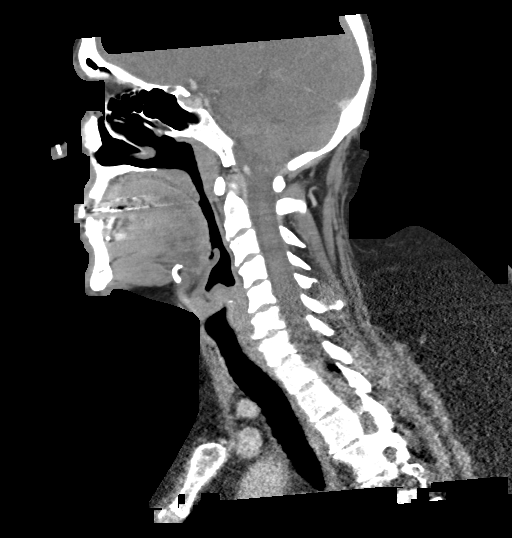
[im 60/120  bone]
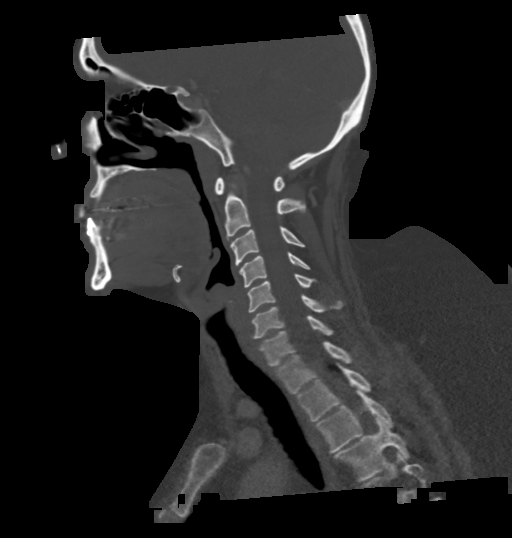
[im 70/120  bone]
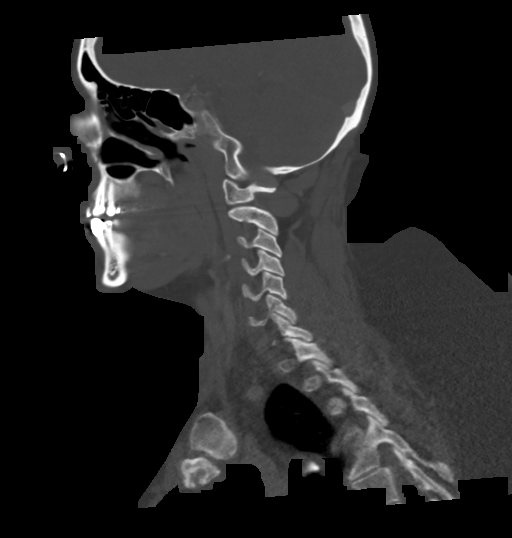
[im 80/120  bone]
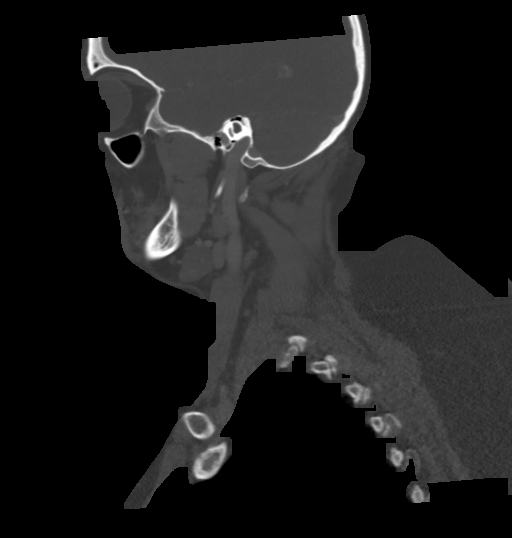

[Series 5: cor neck · coronal · 0.58mm/px · 3 of 145 slices shown]
[im 29/145  bone]
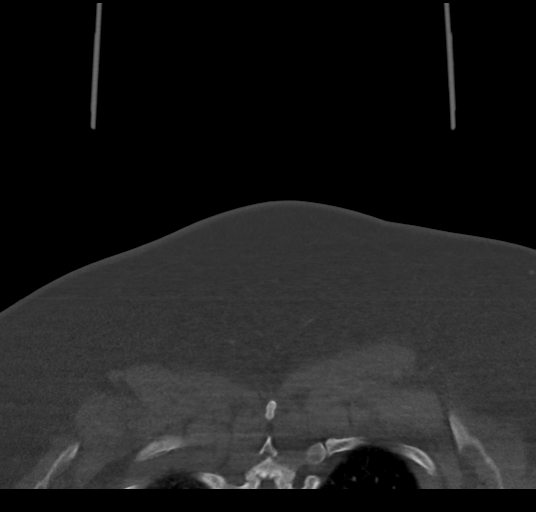
[im 58/145  bone]
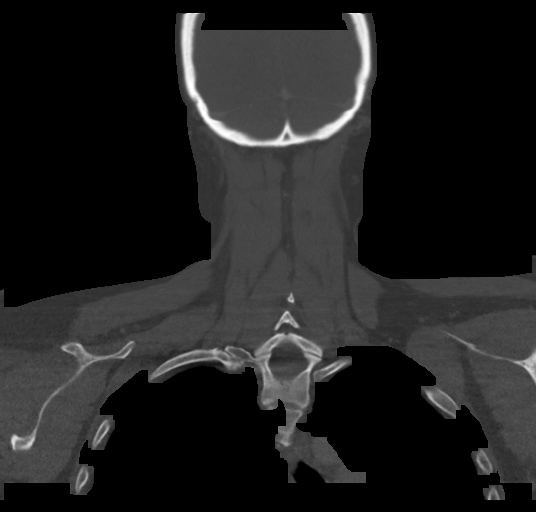
[im 87/145  bone]
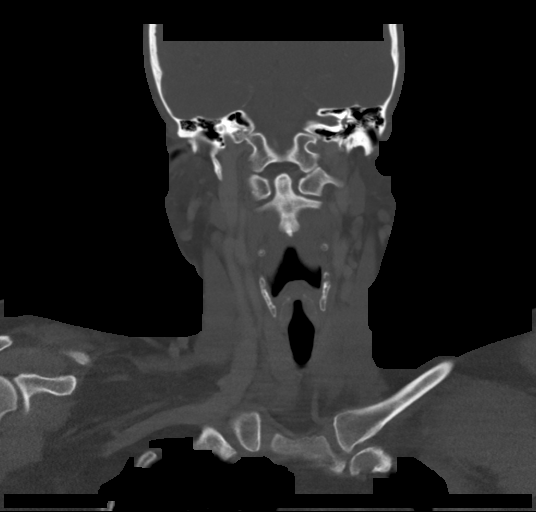

[Series 6: ax oropharynx · axial · 0.57mm/px · z∈[-181,-134]mm · 2 of 146 slices shown]
[im 25/146  bone]
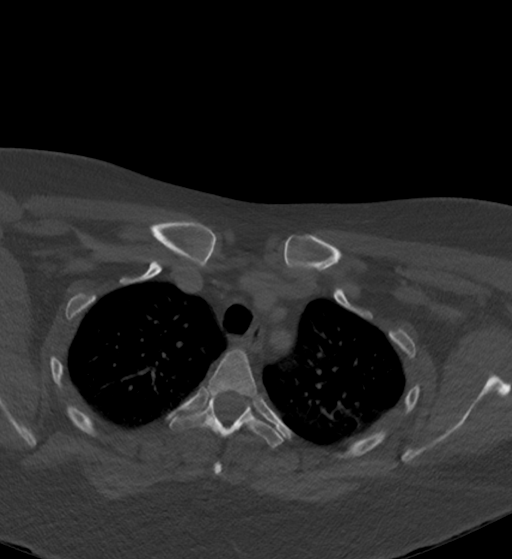
[im 49/146  bone]
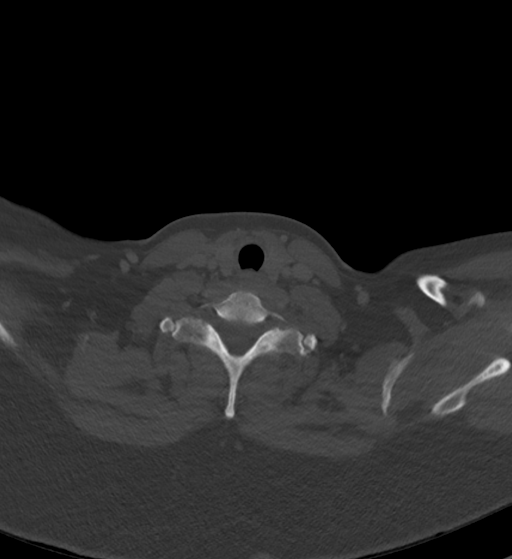

[15 of 33 positions shown; findings below may reference images not displayed]

FINDINGS: Pharynx and larynx: Normal. No mass or swelling.

Salivary glands: No inflammation, mass, or stone. Small periparotid
lymph nodes bilaterally.

Thyroid: Negative

Lymph nodes: Scattered nonpathologic lymph nodes bilaterally. Right
level 2 lymph node 9 mm. Right level 2 lymph node 6 mm. Right level
3 lymph node 9 mm

Left level 2 lymph node 9 mm.  Left level 3 lymph node 9 mm.

Vascular: Normal vascular enhancement.

Limited intracranial: Negative

Visualized orbits: Negative

Mastoids and visualized paranasal sinuses: Paranasal sinuses clear.
Mastoid clear bilaterally

Skeleton: Recent extraction right lower molar. Periapical lucency
around right lower pre molar. Caries left upper cuspid and left
upper molar. No soft tissue abscess around the mandible or maxilla.

Upper chest: Lung apices clear bilaterally.

Other: None
IMPRESSION: Multiple caries.  No soft tissue abscess.

Reactive nodes in the neck bilaterally.

## 2022-07-26 ENCOUNTER — Encounter (HOSPITAL_BASED_OUTPATIENT_CLINIC_OR_DEPARTMENT_OTHER): Payer: Self-pay | Admitting: Emergency Medicine

## 2022-07-26 ENCOUNTER — Other Ambulatory Visit: Payer: Self-pay

## 2022-07-26 ENCOUNTER — Emergency Department (HOSPITAL_BASED_OUTPATIENT_CLINIC_OR_DEPARTMENT_OTHER)
Admission: EM | Admit: 2022-07-26 | Discharge: 2022-07-26 | Disposition: A | Discharge status: Home / Self Care | Payer: Medicaid Other | Attending: Emergency Medicine | Admitting: Emergency Medicine

## 2022-07-26 DIAGNOSIS — M069 Rheumatoid arthritis, unspecified: Secondary | ICD-10-CM | POA: Insufficient documentation

## 2022-07-26 DIAGNOSIS — K047 Periapical abscess without sinus: Secondary | ICD-10-CM | POA: Insufficient documentation

## 2022-07-26 MED ORDER — DEXAMETHASONE SODIUM PHOSPHATE 10 MG/ML IJ SOLN
10.0000 mg | Freq: Once | INTRAMUSCULAR | Status: AC
Start: 2022-07-26 — End: 2022-07-26
  Administered 2022-07-26: 10 mg via INTRAMUSCULAR
  Filled 2022-07-26: qty 1

## 2022-07-26 MED ORDER — ALBUTEROL SULFATE (2.5 MG/3ML) 0.083% IN NEBU: 2.5000 mg | INHALATION_SOLUTION | Freq: Four times a day (QID) | RESPIRATORY_TRACT | 12 refills | Status: DC | PRN

## 2022-07-26 MED ORDER — AMOXICILLIN-POT CLAVULANATE 875-125 MG PO TABS: 1.0000 | ORAL_TABLET | Freq: Two times a day (BID) | ORAL | 0 refills | Status: AC

## 2022-07-26 NOTE — ED Notes (Signed)
Called in WR x 1, no answer 

## 2022-07-26 NOTE — ED Provider Notes (Signed)
[facility] EMERGENCY DEPARTMENT Provider Note   CSN: [id number] Arrival date & time: 07/26/22  8280     History  Chief Complaint  Patient presents with   Dental Pain    Evelen Janiak is a 41 y.o. female presents to the emergency department for evaluation of an abscess on her left maxillary teeth.  She first noticed it approximately 1.5 weeks ago, however she was in jail and did not receive any treatment.  She states it started draining earlier this morning but is concerned about surrounding infection.  Additionally, patient has RA and is having a flareup with pain in multiple joints in her hands arms and legs.  She is requesting steroid injection here today.  She is also requesting refill of her albuterol nebulizer solution for her asthma.  Patient denies chest pain, shortness of breath   Dental Pain      Home Medications Prior to Admission medications   Medication Sig Start Date End Date Taking? Authorizing Provider  albuterol (PROVENTIL) (2.5 MG/3ML) 0.083% nebulizer solution Take 3 mLs (2.5 mg total) by nebulization every 6 (six) hours as needed for wheezing or shortness of breath. 07/26/22  Yes Raynald Blend R, PA-C  amoxicillin-clavulanate (AUGMENTIN) 875-125 MG tablet Take 1 tablet by mouth every 12 (twelve) hours for 10 days. 07/26/22 08/05/22 Yes Janell Quiet, PA-C  albuterol (VENTOLIN HFA) 108 (90 Base) MCG/ACT inhaler Inhale 1-2 puffs into the lungs every 4 (four) hours as needed for wheezing or shortness of breath. 01/15/21   Laveda Abbe, NP  amoxicillin-clavulanate (AUGMENTIN) 875-125 MG tablet Take 1 tablet by mouth every 12 (twelve) hours. 02/06/22   Redwine, Madison A, PA-C  melatonin 3 MG TABS tablet Take 3 mg by mouth at bedtime.    [provider]  montelukast (SINGULAIR) 10 MG tablet Take 10 mg by mouth at bedtime.    [provider]  omeprazole (PRILOSEC) 20 MG capsule Take 20 mg by mouth daily.    [provider]   oxyCODONE (ROXICODONE) 5 MG immediate release tablet Take 1 tablet (5 mg total) by mouth 3 (three) times daily as needed for severe pain. 02/08/22   Al Decant, PA-C      Allergies    Azithromycin    Review of Systems   Review of Systems  HENT:  Positive for dental problem.     Physical Exam Updated Vital Signs BP 99/67 (BP Location: Left Arm)   Pulse (!) 101   Temp 98.3 F (36.8 C) (Oral)   Resp 20   Ht 5\' 3"  (1.6 m)   Wt 77.1 kg   SpO2 100%   BMI 30.11 kg/m  Physical Exam Vitals and nursing note reviewed.  Constitutional:      General: She is not in acute distress.    Appearance: She is not ill-appearing.  HENT:     Head: Atraumatic.     Mouth/Throat:      Comments: Abscess noted to the left upper premolar with surrounding erythematous changes.  Overall poor dentition with  numerous caries.  No trismus.  No facial swelling. Eyes:     Conjunctiva/sclera: Conjunctivae normal.  Cardiovascular:     Rate and Rhythm: Normal rate and regular rhythm.     Pulses: Normal pulses.     Heart sounds: No murmur heard. Pulmonary:     Effort: Pulmonary effort is normal. No respiratory distress.     Breath sounds: Normal breath sounds.  Abdominal:     General: Abdomen  is flat. There is no distension.     Palpations: Abdomen is soft.     Tenderness: There is no abdominal tenderness.  Musculoskeletal:        General: Normal range of motion.     Cervical back: Normal range of motion.  Skin:    General: Skin is warm and dry.     Capillary Refill: Capillary refill takes less than 2 seconds.  Neurological:     General: No focal deficit present.     Mental Status: She is alert.  Psychiatric:        Mood and Affect: Mood normal.     ED Results / Procedures / Treatments   Labs (all labs ordered are listed, but only abnormal results are displayed) Labs Reviewed - No data to display  EKG None  Radiology No results found.  Procedures Procedures    Medications  Ordered in ED Medications  dexamethasone (DECADRON) injection 10 mg (has no administration in time range)    ED Course/ Medical Decision Making/ A&P                           Medical Decision Making Risk Prescription drug management.   41 year old female presents the emergency department for evaluation primarily of a dental abscess with secondary complaints of RA flare and medication refill.  Vitals are without significant abnormality.  On exam, patient has an abscess to the left upper premolar with some surrounding cellulitic changes.  It does appear to have begun draining.  There is no trismus or facial swelling.  Patient has overall poor dentition.  Lungs CTA bilaterally.  Patient was given Decadron 10 mg IM for her rheumatoid arthritis flareup.  Will discharge home with Augmentin for her dental abscess.  Albuterol nebulizer solution refill provided.  Return precautions discussed.  Discharged home in stable condition.  Final Clinical Impression(s) / ED Diagnoses Final diagnoses:  Dental abscess  Rheumatoid arthritis involving multiple sites, unspecified whether rheumatoid factor present [facility])    Rx / DC Orders ED Discharge Orders          Ordered    amoxicillin-clavulanate (AUGMENTIN) 875-125 MG tablet  Every 12 hours        07/26/22 1323    albuterol (PROVENTIL) (2.5 MG/3ML) 0.083% nebulizer solution  Every 6 hours PRN        07/26/22 1323              Tonye Pearson, [place] 07/26/22 1338    Tegeler, Gwenyth Allegra, MD 07/26/22 1530

## 2022-07-26 NOTE — ED Triage Notes (Signed)
States has a dental abscess that started draining this am

## 2022-07-26 NOTE — Discharge Instructions (Addendum)
You were given a steroid injection here in the emergency department for your rheumatoid arthritis flare.  I have also sent you in antibiotics for the dental infection/abscess. please take this for the entire 10 days.  Follow-up with your dentist as needed.  I have also refilled your albuterol nebulizer solution per your request.

## 2022-08-01 ENCOUNTER — Other Ambulatory Visit: Payer: Self-pay

## 2022-08-01 ENCOUNTER — Encounter (HOSPITAL_BASED_OUTPATIENT_CLINIC_OR_DEPARTMENT_OTHER): Payer: Self-pay | Admitting: Emergency Medicine

## 2022-08-01 ENCOUNTER — Emergency Department (HOSPITAL_BASED_OUTPATIENT_CLINIC_OR_DEPARTMENT_OTHER)
Admission: EM | Admit: 2022-08-01 | Discharge: 2022-08-01 | Disposition: A | Discharge status: Home / Self Care | Payer: Medicaid Other | Attending: Emergency Medicine | Admitting: Emergency Medicine

## 2022-08-01 DIAGNOSIS — F4322 Adjustment disorder with anxiety: Secondary | ICD-10-CM | POA: Insufficient documentation

## 2022-08-01 DIAGNOSIS — J45909 Unspecified asthma, uncomplicated: Secondary | ICD-10-CM | POA: Insufficient documentation

## 2022-08-01 DIAGNOSIS — E119 Type 2 diabetes mellitus without complications: Secondary | ICD-10-CM | POA: Insufficient documentation

## 2022-08-01 MED ORDER — DIAZEPAM 5 MG PO TABS: 5.0000 mg | ORAL_TABLET | Freq: Four times a day (QID) | ORAL | 0 refills | Status: DC | PRN

## 2022-08-01 MED ORDER — DIAZEPAM 5 MG PO TABS
5.0000 mg | ORAL_TABLET | Freq: Once | ORAL | Status: AC
  Administered 2022-08-01: 5 mg via ORAL
  Filled 2022-08-01: qty 1

## 2022-08-01 NOTE — ED Triage Notes (Signed)
Pt c/o a panic attack x 2-3 days.

## 2022-08-01 NOTE — ED Provider Notes (Signed)
[facility] DEPT [facility] Provider Note: Georgena Spurling, MD, FACEP  CSN: [id number] MRN: [id number] ARRIVAL: 08/01/22 at 0541 ROOM: [place]  Panic Attack   HISTORY OF PRESENT ILLNESS  08/01/22 5:50 AM Erika Rhodes is a 41 y.o. female with a history of depression and anxiety.  She was currently going through what she describes as a 2 to 3 days panic attack.  It was triggered by the loss of her children to her ex husband (it is unclear if this is a permanent or temporary situation) which has increased her stress level.  She has experienced palpitations, shortness of breath, difficulty sleeping and nightmares when she does sleep, hypervigilance and a sense of impending doom.  She states Valium has worked for her in the past.  She is not currently on any benzodiazepines or antidepressants.   Past Medical History:  Diagnosis Date   Allergic reaction    Anxiety    Asthma    Depression    DM (diabetes mellitus), gestational    Migraines    Rheumatoid arthritis ([place]) 2016   Diagnosed Dr.  Lamarr Lulas; has been treated with MTX, Humira, etc, but made her sick.  Was taking CBD oil and stopped as tested positive in a drug screen for work.    Past Surgical History:  Procedure Laterality Date   ABDOMINAL HYSTERECTOMY  2017   Fibroids; Both ovaries intact still   BACK SURGERY     CESAREAN SECTION  2009   CHOLECYSTECTOMY  2010   laparoscopic   TONSILLECTOMY      Family History  Problem Relation Age of Onset   Arthritis Mother        Rheumatoid   Depression Daughter        and anxiety   Allergies Son     Social History   Tobacco Use   Smoking status: Never   Smokeless tobacco: Never  Vaping Use   Vaping Use: Some days  Substance Use Topics   Alcohol use: Yes    Alcohol/week: 2.0 standard drinks of alcohol    Types: 2 Glasses of wine per week    Comment: occ   Drug use: Yes    Types: Marijuana    Comment: "cannabis delta 10"    Prior to  Admission medications   Medication Sig Start Date End Date Taking? Authorizing Provider  diazepam (VALIUM) 5 MG tablet Take 1 tablet (5 mg total) by mouth every 6 (six) hours as needed for anxiety. 08/01/22  Yes Deland Slocumb, MD  albuterol (PROVENTIL) (2.5 MG/3ML) 0.083% nebulizer solution Take 3 mLs (2.5 mg total) by nebulization every 6 (six) hours as needed for wheezing or shortness of breath. 07/26/22   Tonye Pearson, PA-C  albuterol (VENTOLIN HFA) 108 (90 Base) MCG/ACT inhaler Inhale 1-2 puffs into the lungs every 4 (four) hours as needed for wheezing or shortness of breath. 01/15/21   Ethelene Hal, NP  amoxicillin-clavulanate (AUGMENTIN) 875-125 MG tablet Take 1 tablet by mouth every 12 (twelve) hours for 10 days. 07/26/22 08/05/22  Kathe Becton R, PA-C  melatonin 3 MG TABS tablet Take 3 mg by mouth at bedtime.    [provider]  montelukast (SINGULAIR) 10 MG tablet Take 10 mg by mouth at bedtime.    [provider]  omeprazole (PRILOSEC) 20 MG capsule Take 20 mg by mouth daily.    [provider]  oxyCODONE (ROXICODONE) 5 MG immediate release tablet Take 1 tablet (5 mg total) by mouth  3 (three) times daily as needed for severe pain. 02/08/22   Al Decant, PA-C    Allergies Azithromycin   REVIEW OF SYSTEMS  Negative except as noted here or in the History of Present Illness.   PHYSICAL EXAMINATION  Initial Vital Signs Blood pressure (!) 128/93, pulse 88, temperature 98.2 F (36.8 C), resp. rate 18, height 5\' 3"  (1.6 m), weight 77.1 kg, SpO2 100 %.  Examination General: Well-developed, well-nourished female in no acute distress; appearance consistent with age of record HENT: normocephalic; atraumatic Eyes: Normal appearance Neck: supple Heart: regular rate and rhythm Lungs: clear to auscultation bilaterally Abdomen: soft; nondistended; nontender; bowel sounds present Extremities: No deformity; full range of motion; pulses  normal Neurologic: Awake, alert and oriented; motor function intact in all extremities and symmetric; no facial droop Skin: Warm and dry Psychiatric: Anxious   RESULTS  Summary of this visit's results, reviewed and interpreted by myself:   EKG Interpretation  Date/Time:    Ventricular Rate:    PR Interval:    QRS Duration:   QT Interval:    QTC Calculation:   R Axis:     Text Interpretation:         Laboratory Studies: No results found for this or any previous visit (from the past 24 hour(s)). Imaging Studies: No results found.  ED COURSE and MDM  Nursing notes, initial and subsequent vitals signs, including pulse oximetry, reviewed and interpreted by myself.  Vitals:   08/01/22 0546 08/01/22 0547  BP: (!) 128/93   Pulse: 88   Resp: 18   Temp: 98.2 F (36.8 C)   SpO2: 100%   Weight:  77.1 kg  Height:  5\' 3"  (1.6 m)   Medications  diazepam (VALIUM) tablet 5 mg (has no administration in time range)   We will place the patient on a short course of Valium.  She has an appointment pending with a psychiatrist.   PROCEDURES  Procedures   ED DIAGNOSES     ICD-10-CM   1. Adjustment disorder with anxious mood  F43.22          Doranne Schmutz, MD 08/01/22 [phone]

## 2022-08-03 ENCOUNTER — Other Ambulatory Visit: Payer: Self-pay

## 2022-08-03 ENCOUNTER — Emergency Department (HOSPITAL_BASED_OUTPATIENT_CLINIC_OR_DEPARTMENT_OTHER)
Admission: EM | Admit: 2022-08-03 | Discharge: 2022-08-03 | Disposition: A | Discharge status: Home / Self Care | Payer: Medicaid Other | Attending: Emergency Medicine | Admitting: Emergency Medicine

## 2022-08-03 ENCOUNTER — Encounter (HOSPITAL_BASED_OUTPATIENT_CLINIC_OR_DEPARTMENT_OTHER): Payer: Self-pay

## 2022-08-03 DIAGNOSIS — R739 Hyperglycemia, unspecified: Secondary | ICD-10-CM | POA: Insufficient documentation

## 2022-08-03 DIAGNOSIS — M2550 Pain in unspecified joint: Secondary | ICD-10-CM

## 2022-08-03 DIAGNOSIS — F411 Generalized anxiety disorder: Secondary | ICD-10-CM

## 2022-08-03 DIAGNOSIS — F419 Anxiety disorder, unspecified: Secondary | ICD-10-CM | POA: Insufficient documentation

## 2022-08-03 DIAGNOSIS — K0889 Other specified disorders of teeth and supporting structures: Secondary | ICD-10-CM | POA: Insufficient documentation

## 2022-08-03 MED ORDER — NAPROXEN 375 MG PO TABS: 375.0000 mg | ORAL_TABLET | Freq: Two times a day (BID) | ORAL | 0 refills | Status: DC

## 2022-08-03 MED ORDER — PREDNISONE 50 MG PO TABS
60.0000 mg | ORAL_TABLET | Freq: Once | ORAL | Status: AC
  Administered 2022-08-03: 60 mg via ORAL
  Filled 2022-08-03: qty 1

## 2022-08-03 MED ORDER — HYDROXYZINE HCL 25 MG PO TABS: 25.0000 mg | ORAL_TABLET | Freq: Four times a day (QID) | ORAL | 0 refills | Status: DC | PRN

## 2022-08-03 MED ORDER — PREDNISONE 20 MG PO TABS: ORAL_TABLET | ORAL | 0 refills | Status: DC

## 2022-08-03 MED ORDER — KETOROLAC TROMETHAMINE 30 MG/ML IJ SOLN
30.0000 mg | Freq: Once | INTRAMUSCULAR | Status: AC
  Administered 2022-08-03: 30 mg via INTRAMUSCULAR
  Filled 2022-08-03: qty 1

## 2022-08-03 NOTE — Discharge Instructions (Addendum)
Start the prednisone tomorrow.  You were given your first dose here today.  You are being prescribed naproxen which is an NSAID.  Do not take other NSAIDs such as ibuprofen, Advil, Aleve, etc.  If you develop fever, trouble breathing, vomiting, or any other new/concerning symptoms then return to the ER for evaluation.

## 2022-08-03 NOTE — ED Triage Notes (Signed)
C/o joint pain, rheumatoid arthritis flare. States also has a dental abscess that is painful.  Also requesting more valium for her anxiety until she sees her doctor on Thursday.

## 2022-08-03 NOTE — ED Provider Notes (Signed)
[facility] Provider Note   CSN: [id number] Arrival date & time: 08/03/22  1535     History  Chief Complaint  Patient presents with   Joint Pain   Dental Pain    Erika Rhodes is a 41 y.o. female.  HPI 41 year old female presents with multiple complaints.  She is here for anxiety, which she states has been ongoing for quite some time.  She states she used to be on Valium a long time ago.  She was given Valium when she was here 2 days ago and has finished that prescription.  She was given 5 mg tablets but states she used to take 10 so she was doing that.  She denies feeling suicidal.  She states there is a lot of stress in her life.  She is also describing pain in all of her joints.  She states this is a rheumatoid arthritis flare that typically responds to steroid.  About a week ago she was given IM Decadron with some partial relief.  She has been taking Goody powders but no other meds.  No joint swelling but all of her joints ache.  She also complains of a dental abscess to the left maxillary gumline.  She is on Augmentin and states that the abscess eventually popped and drained.  That feels a lot better but still is sore and painful.  Home Medications Prior to Admission medications   Medication Sig Start Date End Date Taking? Authorizing Provider  hydrOXYzine (ATARAX) 25 MG tablet Take 1 tablet (25 mg total) by mouth every 6 (six) hours as needed for anxiety. 08/03/22  Yes Sherwood Gambler, MD  naproxen (NAPROSYN) 375 MG tablet Take 1 tablet (375 mg total) by mouth 2 (two) times daily. 08/03/22  Yes Sherwood Gambler, MD  predniSONE (DELTASONE) 20 MG tablet 2 tabs po daily x 4 days 08/03/22  Yes Sherwood Gambler, MD  albuterol (PROVENTIL) (2.5 MG/3ML) 0.083% nebulizer solution Take 3 mLs (2.5 mg total) by nebulization every 6 (six) hours as needed for wheezing or shortness of breath. 07/26/22   Tonye Pearson, PA-C  albuterol (VENTOLIN HFA) 108 (90 Base)  MCG/ACT inhaler Inhale 1-2 puffs into the lungs every 4 (four) hours as needed for wheezing or shortness of breath. 01/15/21   Ethelene Hal, NP  amoxicillin-clavulanate (AUGMENTIN) 875-125 MG tablet Take 1 tablet by mouth every 12 (twelve) hours for 10 days. 07/26/22 08/05/22  Tonye Pearson, PA-C  diazepam (VALIUM) 5 MG tablet Take 1 tablet (5 mg total) by mouth every 6 (six) hours as needed for anxiety. 08/01/22   Molpus, John, MD  melatonin 3 MG TABS tablet Take 3 mg by mouth at bedtime.    [provider]  montelukast (SINGULAIR) 10 MG tablet Take 10 mg by mouth at bedtime.    [provider]  omeprazole (PRILOSEC) 20 MG capsule Take 20 mg by mouth daily.    [provider]  oxyCODONE (ROXICODONE) 5 MG immediate release tablet Take 1 tablet (5 mg total) by mouth 3 (three) times daily as needed for severe pain. 02/08/22   Azucena Cecil, PA-C      Allergies    Azithromycin    Review of Systems   Review of Systems  Constitutional:  Negative for fever.  HENT:  Positive for dental problem. Negative for trouble swallowing.   Respiratory:  Negative for shortness of breath.   Musculoskeletal:  Positive for arthralgias. Negative for joint swelling.  Psychiatric/Behavioral:  Negative for  suicidal ideas. The patient is nervous/anxious.     Physical Exam Updated Vital Signs BP (!) 130/97 (BP Location: Left Arm)   Pulse (!) 108   Temp 98 F (36.7 C) (Oral)   Resp 18   Ht 5' 3.5" (1.613 m)   Wt 77.1 kg   SpO2 99%   BMI 29.64 kg/m  Physical Exam Vitals and nursing note reviewed.  Constitutional:      Appearance: She is well-developed.  HENT:     Head: Normocephalic and atraumatic.     Mouth/Throat:   Cardiovascular:     Rate and Rhythm: Normal rate and regular rhythm.     Heart sounds: Normal heart sounds.  Pulmonary:     Effort: Pulmonary effort is normal.     Breath sounds: Normal breath sounds. No stridor. No wheezing.  Abdominal:      Palpations: Abdomen is soft.     Tenderness: There is no abdominal tenderness.  Musculoskeletal:     Comments: No joint swelling appreciated in major joints or hands.  Skin:    General: Skin is warm and dry.  Neurological:     Mental Status: She is alert.  Psychiatric:        Thought Content: Thought content does not include suicidal ideation.     ED Results / Procedures / Treatments   Labs (all labs ordered are listed, but only abnormal results are displayed) Labs Reviewed - No data to display  EKG None  Radiology No results found.  Procedures Procedures    Medications Ordered in ED Medications  ketorolac (TORADOL) 30 MG/ML injection 30 mg (has no administration in time range)  predniSONE (DELTASONE) tablet 60 mg (has no administration in time range)    ED Course/ Medical Decision Making/ A&P                           Medical Decision Making Amount and/or Complexity of Data Reviewed External Data Reviewed: notes.  Risk Prescription drug management.   Patient presents with the above complaints.  From an anxiety states she is not suicidal or psychotic.  I will prescribe hydroxyzine but discussed we will not refill the Valium.   The dental abscess seems a lot better than what was described a week ago.  I do not think further intervention is needed and certainly no incision/drainage or change of antibiotics.  Follow-up with dentistry as she is already planning to do.  From a rheumatoid perspective, she has had a history of hyperglycemia but she states this is only associated with steroids and she would like to go back on steroids to help.  We will give a burst of prednisone though at the same time she does not have any obvious joint swelling or erythema.  I do not think there is any infection or systemic illness otherwise.  I think she can follow-up with her PCP.  We will give IM Toradol and prednisone here and prescribe naproxen, hydroxyzine, and steroid burst.  Given  return precautions.        Final Clinical Impression(s) / ED Diagnoses Final diagnoses:  Anxiety state  Arthralgia, unspecified joint    Rx / DC Orders ED Discharge Orders          Ordered    naproxen (NAPROSYN) 375 MG tablet  2 times daily        08/03/22 1907    predniSONE (DELTASONE) 20 MG tablet  08/03/22 1907    hydrOXYzine (ATARAX) 25 MG tablet  Every 6 hours PRN        08/03/22 1907              Sherwood Gambler, MD 08/03/22 1912

## 2022-09-08 ENCOUNTER — Emergency Department (HOSPITAL_BASED_OUTPATIENT_CLINIC_OR_DEPARTMENT_OTHER): Payer: Self-pay

## 2022-09-08 ENCOUNTER — Encounter (HOSPITAL_COMMUNITY): Payer: Self-pay

## 2022-09-08 ENCOUNTER — Other Ambulatory Visit: Payer: Self-pay

## 2022-09-08 ENCOUNTER — Encounter (HOSPITAL_BASED_OUTPATIENT_CLINIC_OR_DEPARTMENT_OTHER): Payer: Self-pay | Admitting: Emergency Medicine

## 2022-09-08 ENCOUNTER — Inpatient Hospital Stay (HOSPITAL_BASED_OUTPATIENT_CLINIC_OR_DEPARTMENT_OTHER)
Admission: EM | Admit: 2022-09-08 | Discharge: 2022-09-09 | DRG: 872 | Discharge status: Against Medical Advice | Payer: Self-pay | Attending: Internal Medicine | Admitting: Internal Medicine

## 2022-09-08 DIAGNOSIS — Z8261 Family history of arthritis: Secondary | ICD-10-CM

## 2022-09-08 DIAGNOSIS — L0390 Cellulitis, unspecified: Secondary | ICD-10-CM

## 2022-09-08 DIAGNOSIS — M069 Rheumatoid arthritis, unspecified: Secondary | ICD-10-CM | POA: Diagnosis present

## 2022-09-08 DIAGNOSIS — Z79899 Other long term (current) drug therapy: Secondary | ICD-10-CM

## 2022-09-08 DIAGNOSIS — F32A Depression, unspecified: Secondary | ICD-10-CM | POA: Diagnosis present

## 2022-09-08 DIAGNOSIS — E876 Hypokalemia: Secondary | ICD-10-CM | POA: Diagnosis present

## 2022-09-08 DIAGNOSIS — A419 Sepsis, unspecified organism: Principal | ICD-10-CM

## 2022-09-08 DIAGNOSIS — F419 Anxiety disorder, unspecified: Secondary | ICD-10-CM | POA: Diagnosis present

## 2022-09-08 DIAGNOSIS — L03211 Cellulitis of face: Secondary | ICD-10-CM

## 2022-09-08 DIAGNOSIS — K219 Gastro-esophageal reflux disease without esophagitis: Secondary | ICD-10-CM

## 2022-09-08 DIAGNOSIS — E1165 Type 2 diabetes mellitus with hyperglycemia: Secondary | ICD-10-CM

## 2022-09-08 DIAGNOSIS — E669 Obesity, unspecified: Secondary | ICD-10-CM | POA: Diagnosis present

## 2022-09-08 DIAGNOSIS — Z6830 Body mass index (BMI) 30.0-30.9, adult: Secondary | ICD-10-CM

## 2022-09-08 DIAGNOSIS — K029 Dental caries, unspecified: Secondary | ICD-10-CM | POA: Diagnosis present

## 2022-09-08 DIAGNOSIS — Z881 Allergy status to other antibiotic agents status: Secondary | ICD-10-CM

## 2022-09-08 DIAGNOSIS — Z818 Family history of other mental and behavioral disorders: Secondary | ICD-10-CM

## 2022-09-08 DIAGNOSIS — J45909 Unspecified asthma, uncomplicated: Secondary | ICD-10-CM | POA: Diagnosis present

## 2022-09-08 DIAGNOSIS — Z5329 Procedure and treatment not carried out because of patient's decision for other reasons: Secondary | ICD-10-CM | POA: Diagnosis present

## 2022-09-08 HISTORY — DX: Cellulitis, unspecified: L03.90

## 2022-09-08 LAB — CBC WITH DIFFERENTIAL/PLATELET
Abs Immature Granulocytes: 0.05 10*3/uL (ref 0.00–0.07)
Basophils Absolute: 0.1 10*3/uL (ref 0.0–0.1)
Basophils Relative: 0 %
Eosinophils Absolute: 0.1 10*3/uL (ref 0.0–0.5)
Eosinophils Relative: 1 %
HCT: 40.9 % (ref 36.0–46.0)
Hemoglobin: 14.5 g/dL (ref 12.0–15.0)
Immature Granulocytes: 0 %
Lymphocytes Relative: 18 %
Lymphs Abs: 2.6 10*3/uL (ref 0.7–4.0)
MCH: 31 pg (ref 26.0–34.0)
MCHC: 35.5 g/dL (ref 30.0–36.0)
MCV: 87.4 fL (ref 80.0–100.0)
Monocytes Absolute: 0.6 10*3/uL (ref 0.1–1.0)
Monocytes Relative: 4 %
Neutro Abs: 10.8 10*3/uL — ABNORMAL HIGH (ref 1.7–7.7)
Neutrophils Relative %: 77 %
Platelets: 324 10*3/uL (ref 150–400)
RBC: 4.68 MIL/uL (ref 3.87–5.11)
RDW: 12.2 % (ref 11.5–15.5)
WBC: 14.2 10*3/uL — ABNORMAL HIGH (ref 4.0–10.5)
nRBC: 0 % (ref 0.0–0.2)

## 2022-09-08 LAB — BASIC METABOLIC PANEL
Anion gap: 8 (ref 5–15)
BUN: 7 mg/dL (ref 6–20)
CO2: 25 mmol/L (ref 22–32)
Calcium: 8.6 mg/dL — ABNORMAL LOW (ref 8.9–10.3)
Chloride: 102 mmol/L (ref 98–111)
Creatinine, Ser: 0.72 mg/dL (ref 0.44–1.00)
GFR, Estimated: >60 mL/min (ref >60)
Glucose, Bld: 214 mg/dL — ABNORMAL HIGH (ref 70–99)
Potassium: 4 mmol/L (ref 3.5–5.1)
Sodium: 135 mmol/L (ref 135–145)

## 2022-09-08 LAB — HCG, SERUM, QUALITATIVE: Preg, Serum: NEGATIVE

## 2022-09-08 MED ORDER — HYDROMORPHONE HCL 1 MG/ML IJ SOLN
0.5000 mg | Freq: Once | INTRAMUSCULAR | Status: AC
  Administered 2022-09-08: 0.5 mg via INTRAVENOUS
  Filled 2022-09-08: qty 1

## 2022-09-08 MED ORDER — MONTELUKAST SODIUM 10 MG PO TABS
10.0000 mg | ORAL_TABLET | Freq: Every day | ORAL | Status: DC
  Filled 2022-09-08: qty 1

## 2022-09-08 MED ORDER — DIPHENHYDRAMINE HCL 50 MG/ML IJ SOLN
25.0000 mg | Freq: Once | INTRAMUSCULAR | Status: AC
  Administered 2022-09-08: 25 mg via INTRAVENOUS
  Filled 2022-09-08: qty 1

## 2022-09-08 MED ORDER — DIAZEPAM 5 MG PO TABS: 5.0000 mg | ORAL_TABLET | Freq: Once | ORAL | Status: DC

## 2022-09-08 MED ORDER — SODIUM CHLORIDE 0.9 % IV SOLN
3.0000 g | Freq: Once | INTRAVENOUS | Status: AC
  Administered 2022-09-08: 3 g via INTRAVENOUS

## 2022-09-08 MED ORDER — ONDANSETRON HCL 4 MG/2ML IJ SOLN
INTRAMUSCULAR | Status: AC
  Filled 2022-09-08: qty 2

## 2022-09-08 MED ORDER — PANTOPRAZOLE SODIUM 40 MG PO TBEC
40.0000 mg | DELAYED_RELEASE_TABLET | Freq: Every day | ORAL | Status: DC
  Administered 2022-09-09: 40 mg via ORAL
  Filled 2022-09-08: qty 1

## 2022-09-08 MED ORDER — NAPROXEN 375 MG PO TABS
375.0000 mg | ORAL_TABLET | Freq: Two times a day (BID) | ORAL | Status: DC
  Administered 2022-09-09: 375 mg via ORAL
  Filled 2022-09-08 (×2): qty 1

## 2022-09-08 MED ORDER — KETOROLAC TROMETHAMINE 15 MG/ML IJ SOLN
15.0000 mg | Freq: Once | INTRAMUSCULAR | Status: AC
  Administered 2022-09-08: 15 mg via INTRAVENOUS
  Filled 2022-09-08: qty 1

## 2022-09-08 MED ORDER — ONDANSETRON HCL 4 MG/2ML IJ SOLN
4.0000 mg | Freq: Once | INTRAMUSCULAR | Status: AC
  Administered 2022-09-08: 4 mg via INTRAVENOUS

## 2022-09-08 MED ORDER — SODIUM CHLORIDE 0.9 % IV SOLN
3.0000 g | Freq: Four times a day (QID) | INTRAVENOUS | Status: DC
  Filled 2022-09-08: qty 8

## 2022-09-08 MED ORDER — VANCOMYCIN HCL IN DEXTROSE 1-5 GM/200ML-% IV SOLN: 1000.0000 mg | Freq: Once | INTRAVENOUS | Status: DC

## 2022-09-08 MED ORDER — OXYCODONE HCL 5 MG PO TABS
5.0000 mg | ORAL_TABLET | Freq: Three times a day (TID) | ORAL | Status: DC | PRN
  Administered 2022-09-09 (×3): 5 mg via ORAL
  Filled 2022-09-08 (×3): qty 1

## 2022-09-08 MED ORDER — IOHEXOL 300 MG/ML  SOLN
75.0000 mL | Freq: Once | INTRAMUSCULAR | Status: AC | PRN
  Administered 2022-09-08: 75 mL via INTRAVENOUS

## 2022-09-08 MED ORDER — DIAZEPAM 5 MG PO TABS
5.0000 mg | ORAL_TABLET | Freq: Four times a day (QID) | ORAL | Status: DC | PRN
  Administered 2022-09-08 – 2022-09-09 (×3): 5 mg via ORAL
  Filled 2022-09-08 (×3): qty 1

## 2022-09-08 MED ORDER — MELATONIN 3 MG PO TABS
3.0000 mg | ORAL_TABLET | Freq: Every day | ORAL | Status: DC
  Filled 2022-09-08: qty 1

## 2022-09-08 NOTE — ED Provider Notes (Signed)
[facility] Provider Note   CSN: [id number] Arrival date & time: 09/08/22  1736     History {Add pertinent medical, surgical, social history, OB history to HPI:1} Chief Complaint  Patient presents with   Dental Pain   Facial Swelling    Erika Rhodes is a 41 y.o. female, no pertinent past medical history, who presents to the ED secondary to swelling of the left side of her face for the last few days.  She states that she was seen for dental abscess in mid September, and took a 1 week course of Augmentin, which improved there.  Has been unable to get her teeth pulled, since then, and says she developed some swelling of her face couple days ago, and started some doxycycline, without relief.  She states that the swelling has now spread to her eye, she has bruising along her eye now.  Endorses having chills, and feels very warm.  She has not been taking anything for the pain other than Goody's.  She has difficulty chewing, opening her mouth.  No vision changes or changes in voice or drooling.     Home Medications Prior to Admission medications   Medication Sig Start Date End Date Taking? Authorizing Provider  albuterol (PROVENTIL) (2.5 MG/3ML) 0.083% nebulizer solution Take 3 mLs (2.5 mg total) by nebulization every 6 (six) hours as needed for wheezing or shortness of breath. 07/26/22   Tonye Pearson, PA-C  albuterol (VENTOLIN HFA) 108 (90 Base) MCG/ACT inhaler Inhale 1-2 puffs into the lungs every 4 (four) hours as needed for wheezing or shortness of breath. 01/15/21   Ethelene Hal, NP  diazepam (VALIUM) 5 MG tablet Take 1 tablet (5 mg total) by mouth every 6 (six) hours as needed for anxiety. 08/01/22   Molpus, John, MD  hydrOXYzine (ATARAX) 25 MG tablet Take 1 tablet (25 mg total) by mouth every 6 (six) hours as needed for anxiety. 08/03/22   Sherwood Gambler, MD  melatonin 3 MG TABS tablet Take 3 mg by mouth at bedtime.    [provider]   montelukast (SINGULAIR) 10 MG tablet Take 10 mg by mouth at bedtime.    [provider]  naproxen (NAPROSYN) 375 MG tablet Take 1 tablet (375 mg total) by mouth 2 (two) times daily. 08/03/22   Sherwood Gambler, MD  omeprazole (PRILOSEC) 20 MG capsule Take 20 mg by mouth daily.    [provider]  oxyCODONE (ROXICODONE) 5 MG immediate release tablet Take 1 tablet (5 mg total) by mouth 3 (three) times daily as needed for severe pain. 02/08/22   Azucena Cecil, PA-C  predniSONE (DELTASONE) 20 MG tablet 2 tabs po daily x 4 days 08/03/22   Sherwood Gambler, MD      Allergies    Azithromycin    Review of Systems   Review of Systems  Constitutional:  Positive for chills.  HENT:  Positive for dental problem and facial swelling. Negative for drooling and voice change.     Physical Exam Updated Vital Signs BP (!) 116/91 (BP Location: Left Arm)   Pulse 100   Temp 98.4 F (36.9 C) (Oral)   Resp 18   Ht 5' 3.5" (1.613 m)   Wt 77.1 kg   SpO2 99%   BMI 29.64 kg/m  Physical Exam HENT:     Head: Normocephalic and atraumatic.     Jaw: Tenderness and pain on movement present.     Right Ear: Tympanic membrane normal.  Left Ear: Tympanic membrane normal.     Nose: Nose normal.     Mouth/Throat:     Mouth: Mucous membranes are moist.     Dentition: Dental caries present.     Pharynx: No oropharyngeal exudate.     Comments: +multiple rotting dental caries with no fluctuance along gum line Eyes:     Extraocular Movements: Extraocular movements intact.     Pupils: Pupils are equal, round, and reactive to light.  Cardiovascular:     Rate and Rhythm: Normal rate and regular rhythm.  Pulmonary:     Effort: Pulmonary effort is normal.  Abdominal:     General: Abdomen is flat.  Musculoskeletal:        General: Normal range of motion.     Cervical back: Normal range of motion and neck supple.  Skin:    General: Skin is warm.     Comments: +diffuse swelling and induration  of L cheek w/quarter sized areas of induration and erythema on the L chin and neck. Ecchymoses around L eye. No orbital swelling.   Neurological:     Mental Status: She is alert.     ED Results / Procedures / Treatments   Labs (all labs ordered are listed, but only abnormal results are displayed) Labs Reviewed  CBC WITH DIFFERENTIAL/PLATELET - Abnormal; Notable for the following components:      Result Value   WBC 14.2 (*)    Neutro Abs 10.8 (*)    All other components within normal limits  BASIC METABOLIC PANEL - Abnormal; Notable for the following components:   Glucose, Bld 214 (*)    Calcium 8.6 (*)    All other components within normal limits  HCG, SERUM, QUALITATIVE    EKG None  Radiology No results found.  Procedures Procedures  {Document cardiac monitor, telemetry assessment procedure when appropriate:1}  Medications Ordered in ED Medications  Ampicillin-Sulbactam (UNASYN) 3 g in sodium chloride 0.9 % 100 mL IVPB (has no administration in time range)  iohexol (OMNIPAQUE) 300 MG/ML solution 75 mL (has no administration in time range)  HYDROmorphone (DILAUDID) injection 0.5 mg (0.5 mg Intravenous Given 09/08/22 1921)  ketorolac (TORADOL) 15 MG/ML injection 15 mg (15 mg Intravenous Given 09/08/22 1919)  diphenhydrAMINE (BENADRYL) injection 25 mg (25 mg Intravenous Given 09/08/22 1922)  ondansetron (ZOFRAN) injection 4 mg (4 mg Intravenous Given 09/08/22 1917)    ED Course/ Medical Decision Making/ A&P                           Medical Decision Making Amount and/or Complexity of Data Reviewed Labs: ordered.  Risk Prescription drug management.   ***  {Document critical care time when appropriate:1} {Document review of labs and clinical decision tools ie heart score, Chads2Vasc2 etc:1}  {Document your independent review of radiology images, and any outside records:1} {Document your discussion with family members, caretakers, and with  consultants:1} {Document social determinants of health affecting pt's care:1} {Document your decision making why or why not admission, treatments were needed:1} Final Clinical Impression(s) / ED Diagnoses Final diagnoses:  None    Rx / DC Orders ED Discharge Orders     None

## 2022-09-08 NOTE — ED Notes (Signed)
Pt given a meatloaf frozen dinner and coke to drink. OK per RN Anne Ng. Pt requested the same.

## 2022-09-08 NOTE — ED Notes (Signed)
Patient transported to CT 

## 2022-09-08 NOTE — ED Notes (Signed)
Per provider, okay for pt to eat and drink

## 2022-09-08 NOTE — Plan of Care (Signed)
[facility] will assume care on arrival to accepting facility. Until arrival, care as per EDP. However, [facility] available 24/7 for questions and assistance.  Nursing staff, please page [facility] Admits and Consults ([phone]) as soon as the patient arrives to the hospital.   

## 2022-09-08 NOTE — ED Provider Triage Note (Signed)
Emergency Medicine Provider Triage Evaluation Note  Erika Rhodes , a 41 y.o. female  was evaluated in triage.  Pt complains of patient complains of left facial pain and swelling.  She was recently seen for dental abscess 9/17 and took a 1 week course of Augmentin she states that she has had severe swelling and pain on the left side of the face for the past 5 days.  She endorses fevers at home, chills, states that she has difficulty chewing.  Review of Systems  Positive: Left face pain Negative: CP  Physical Exam  BP (!) 116/91 (BP Location: Left Arm)   Pulse 100   Temp 98.4 F (36.9 C) (Oral)   Resp 18   Ht 5' 3.5" (1.613 m)   Wt 77.1 kg   SpO2 99%   BMI 29.64 kg/m  Gen:   Awake, no distress   Resp:  Normal effort  MSK:   Moves extremities without difficulty  Other:  Left face with swelling redness. Two skin lesions on chin/cheek. No obvious fluctuance.   Medical Decision Making  Medically screening exam initiated at 6:20 PM.  Appropriate orders placed.  Amina Liburd was informed that the remainder of the evaluation will be completed by another provider, this initial triage assessment does not replace that evaluation, and the importance of remaining in the ED until their evaluation is complete.  CT soft tissue neck    Tedd Sias, [place] 09/08/22 1823

## 2022-09-08 NOTE — ED Triage Notes (Signed)
Left sided dental pain x 5 days. Reports dental abscess in same area. Also endorses intermittent fevers, chills. Left side of face is swollen, red, and hard.

## 2022-09-09 DIAGNOSIS — L0390 Cellulitis, unspecified: Secondary | ICD-10-CM

## 2022-09-09 DIAGNOSIS — M059 Rheumatoid arthritis with rheumatoid factor, unspecified: Secondary | ICD-10-CM

## 2022-09-09 DIAGNOSIS — E1165 Type 2 diabetes mellitus with hyperglycemia: Secondary | ICD-10-CM

## 2022-09-09 DIAGNOSIS — A419 Sepsis, unspecified organism: Secondary | ICD-10-CM

## 2022-09-09 DIAGNOSIS — K219 Gastro-esophageal reflux disease without esophagitis: Secondary | ICD-10-CM

## 2022-09-09 DIAGNOSIS — L03211 Cellulitis of face: Secondary | ICD-10-CM

## 2022-09-09 DIAGNOSIS — J4520 Mild intermittent asthma, uncomplicated: Secondary | ICD-10-CM

## 2022-09-09 DIAGNOSIS — F419 Anxiety disorder, unspecified: Secondary | ICD-10-CM

## 2022-09-09 HISTORY — DX: Cellulitis of face: L03.211

## 2022-09-09 HISTORY — DX: Sepsis, unspecified organism: A41.9

## 2022-09-09 LAB — PROTIME-INR
INR: 1.1 (ref 0.8–1.2)
Prothrombin Time: 14.4 seconds (ref 11.4–15.2)

## 2022-09-09 LAB — BASIC METABOLIC PANEL
Anion gap: 8 (ref 5–15)
BUN: 9 mg/dL (ref 6–20)
CO2: 25 mmol/L (ref 22–32)
Calcium: 8.5 mg/dL — ABNORMAL LOW (ref 8.9–10.3)
Chloride: 105 mmol/L (ref 98–111)
Creatinine, Ser: 0.81 mg/dL (ref 0.44–1.00)
GFR, Estimated: >60 mL/min (ref >60)
Glucose, Bld: 140 mg/dL — ABNORMAL HIGH (ref 70–99)
Potassium: 3.4 mmol/L — ABNORMAL LOW (ref 3.5–5.1)
Sodium: 138 mmol/L (ref 135–145)

## 2022-09-09 LAB — CBC
HCT: 39 % (ref 36.0–46.0)
Hemoglobin: 13.7 g/dL (ref 12.0–15.0)
MCH: 31.2 pg (ref 26.0–34.0)
MCHC: 35.1 g/dL (ref 30.0–36.0)
MCV: 88.8 fL (ref 80.0–100.0)
Platelets: 283 10*3/uL (ref 150–400)
RBC: 4.39 MIL/uL (ref 3.87–5.11)
RDW: 12.2 % (ref 11.5–15.5)
WBC: 11.3 10*3/uL — ABNORMAL HIGH (ref 4.0–10.5)
nRBC: 0 % (ref 0.0–0.2)

## 2022-09-09 LAB — CORTISOL-AM, BLOOD: Cortisol - AM: 3.5 ug/dL — ABNORMAL LOW (ref 6.7–22.6)

## 2022-09-09 LAB — PROCALCITONIN: Procalcitonin: <0.1 ng/mL

## 2022-09-09 LAB — HEMOGLOBIN A1C
Hgb A1c MFr Bld: 8.3 % — ABNORMAL HIGH (ref 4.8–5.6)
Mean Plasma Glucose: 191.51 mg/dL

## 2022-09-09 LAB — HIV ANTIBODY (ROUTINE TESTING W REFLEX): HIV Screen 4th Generation wRfx: NONREACTIVE

## 2022-09-09 LAB — MRSA NEXT GEN BY PCR, NASAL: MRSA by PCR Next Gen: DETECTED — AB

## 2022-09-09 LAB — GLUCOSE, CAPILLARY: Glucose-Capillary: 122 mg/dL — ABNORMAL HIGH (ref 70–99)

## 2022-09-09 MED ORDER — SODIUM CHLORIDE 0.9 % IV SOLN
3.0000 g | Freq: Four times a day (QID) | INTRAVENOUS | Status: DC
  Administered 2022-09-09 (×3): 3 g via INTRAVENOUS
  Filled 2022-09-09 (×4): qty 8

## 2022-09-09 MED ORDER — LORATADINE 10 MG PO TABS
10.0000 mg | ORAL_TABLET | Freq: Every day | ORAL | Status: DC
  Administered 2022-09-09: 10 mg via ORAL
  Filled 2022-09-09: qty 1

## 2022-09-09 MED ORDER — POTASSIUM CHLORIDE 10 MEQ/100ML IV SOLN
10.0000 meq | INTRAVENOUS | Status: AC
  Administered 2022-09-09 (×3): 10 meq via INTRAVENOUS
  Filled 2022-09-09 (×3): qty 100

## 2022-09-09 MED ORDER — VANCOMYCIN HCL 1500 MG/300ML IV SOLN
1500.0000 mg | Freq: Once | INTRAVENOUS | Status: AC
  Administered 2022-09-09: 1500 mg via INTRAVENOUS
  Filled 2022-09-09: qty 300

## 2022-09-09 MED ORDER — VANCOMYCIN HCL 750 MG/150ML IV SOLN
750.0000 mg | Freq: Two times a day (BID) | INTRAVENOUS | Status: DC
  Administered 2022-09-09: 750 mg via INTRAVENOUS
  Filled 2022-09-09: qty 150

## 2022-09-09 MED ORDER — ACETAMINOPHEN 650 MG RE SUPP: 650.0000 mg | Freq: Four times a day (QID) | RECTAL | Status: DC | PRN

## 2022-09-09 MED ORDER — SODIUM CHLORIDE 0.9 % IV SOLN: 3.0000 g | Freq: Four times a day (QID) | INTRAVENOUS | Status: DC

## 2022-09-09 MED ORDER — MAGNESIUM HYDROXIDE 400 MG/5ML PO SUSP: 30.0000 mL | Freq: Every day | ORAL | Status: DC | PRN

## 2022-09-09 MED ORDER — METOCLOPRAMIDE HCL 5 MG/ML IJ SOLN: 5.0000 mg | Freq: Four times a day (QID) | INTRAMUSCULAR | Status: DC | PRN

## 2022-09-09 MED ORDER — INSULIN ASPART 100 UNIT/ML IJ SOLN: 0.0000 [IU] | Freq: Three times a day (TID) | INTRAMUSCULAR | Status: DC

## 2022-09-09 MED ORDER — POTASSIUM CHLORIDE CRYS ER 20 MEQ PO TBCR: 40.0000 meq | EXTENDED_RELEASE_TABLET | Freq: Once | ORAL | Status: DC

## 2022-09-09 MED ORDER — ACETAMINOPHEN 325 MG PO TABS: 650.0000 mg | ORAL_TABLET | Freq: Four times a day (QID) | ORAL | Status: DC | PRN

## 2022-09-09 MED ORDER — HYDROXYZINE HCL 25 MG PO TABS: 25.0000 mg | ORAL_TABLET | Freq: Four times a day (QID) | ORAL | Status: DC | PRN

## 2022-09-09 MED ORDER — MORPHINE SULFATE (PF) 2 MG/ML IV SOLN
2.0000 mg | INTRAVENOUS | Status: DC | PRN
  Administered 2022-09-09 (×3): 2 mg via INTRAVENOUS
  Filled 2022-09-09 (×3): qty 1

## 2022-09-09 MED ORDER — ALBUTEROL SULFATE (2.5 MG/3ML) 0.083% IN NEBU: 2.5000 mg | INHALATION_SOLUTION | Freq: Four times a day (QID) | RESPIRATORY_TRACT | Status: DC | PRN

## 2022-09-09 MED ORDER — TRAZODONE HCL 50 MG PO TABS: 25.0000 mg | ORAL_TABLET | Freq: Every evening | ORAL | Status: DC | PRN

## 2022-09-09 MED ORDER — SODIUM CHLORIDE 0.9 % IV SOLN: INTRAVENOUS | Status: DC

## 2022-09-09 MED ORDER — ENOXAPARIN SODIUM 40 MG/0.4ML IJ SOSY: 40.0000 mg | PREFILLED_SYRINGE | INTRAMUSCULAR | Status: DC

## 2022-09-09 MED ORDER — INSULIN ASPART 100 UNIT/ML IJ SOLN: 0.0000 [IU] | Freq: Every day | INTRAMUSCULAR | Status: DC

## 2022-09-09 MED ORDER — VANCOMYCIN HCL IN DEXTROSE 1-5 GM/200ML-% IV SOLN: 1000.0000 mg | Freq: Once | INTRAVENOUS | Status: DC

## 2022-09-09 NOTE — Assessment & Plan Note (Signed)
-   This is manifested by her leukocytosis and tachypnea. - Management as above. - She will be hydrated with IV normal saline.

## 2022-09-09 NOTE — Progress Notes (Signed)
Paged [facility] and Consults about patient's arrival to floor. Awaiting MD to see pt. Plan of care ongoing.

## 2022-09-09 NOTE — Progress Notes (Signed)
[facility]                                                                              Erika Rhodes, is a 41 y.o. female, DOB - Apr 29, 1981, QBH:[id number] Admit date - 09/08/2022    Outpatient Primary MD for the patient is Patient, No Pcp Per  LOS - 0  days  Chief Complaint  Patient presents with   Dental Pain   Facial Swelling       Brief summary   Patient is a 41 year old female with history of anxiety, depression, asthma, migraine, RA, DM type II presented to ED with acute onset of worsening of left facial swelling.  Patient reported that she had upper jaw tooth abscess weeks ago for which she was given Augmentin and almost clear abdomen.  She later became sick with URI symptoms and sinus infection with nasal congestion.  She started having worsening left cheek swelling with erythema as well as upper and lower jaw pain with associated purulent skin drainage.  Swelling extended up to her eye and down to her neck.  She had a televisit with doctors on demand and was prescribed p.o. doxycycline which she took over the last couple of days.  She reported worsening of the symptoms, experienced a tactile fever and chills with diaphoresis, nausea and vomiting. CBC showed leukocytosis of 14.2 with neutrophilia. CT imaging showed diffuse soft tissue swelling with inflammatory stranding involving the left face with inferior extension along the left anterior neck as above, concerning for acute infection/cellulitis.  Ill-defined soft tissue stranding/phlegmonous change within these regions but no organized collection or abscess identified Vital signs on admission showed temperature 98.4, respiratory rate 18, tachycardia with heart rate of 100, BP 116/91  Patient was admitted for further work-up.     Assessment & Plan    Principal Problem: Sepsis with Facial cellulitis ([place]) -Patient had leukocytosis, tachycardia, borderline BP, source likely due to facial cellulitis -CT  imaging did not show organized fluid collection or abscess however does have induration on the left cheek and under chin -Continue IV Unasyn. -ENT consulted, will await recommendations -Sepsis physiology resolved.  DC IV fluids -For now orthopantogram  Active Problems: History of depression with anxiety -Currently, having significant anxiety, mistrust of healthcare from past experience, does not want OR (patient was explained clearly that it will depend on ENT recommendations, may not need any drainage as there is no abscess on CT), then wants to leave AMA and "nature takes its course". -I discussed with the patient regarding continue antibiotics and await ENT recommendations before deciding on AMA as she may not need any drainage -Psych also consulted    Rheumatoid arthritis ([place]) -Out patient follow-up with her rheumatologist    Asthma -Currently stable, no wheezing    GERD without esophagitis -Continue PPI    Uncontrolled type 2 diabetes mellitus with hyperglycemia, without long-term current use of insulin (HCC) -Hemoglobin A1c 8.3 -Continue sliding scale insulin while inpatient  Hypokalemia -replaced   Obesity Estimated body mass index is 30.83 kg/m as calculated from the following:   Height as of this encounter: 5' 3.5" (1.613 m).   Weight  as of this encounter: 80.2 kg.  Code Status: Full code DVT Prophylaxis:  enoxaparin (LOVENOX) injection 40 mg Start: 09/09/22 1000   Level of Care: Level of care: Med-Surg Family Communication: Updated patient   Disposition Plan:      Remains inpatient appropriate: On IV antibiotics, awaiting ENT recommendations   Procedures:  None  Consultants:    ENT Psych   Antimicrobials:   Anti-infectives (From admission, onward)    Start     Dose/Rate Route Frequency Ordered Stop   09/09/22 1600  vancomycin (VANCOREADY) IVPB 750 mg/150 mL        750 mg 150 mL/hr over 60 Minutes Intravenous Every 12 hours 09/09/22 0251      09/09/22 0345  vancomycin (VANCOREADY) IVPB 1500 mg/300 mL        1,500 mg 150 mL/hr over 120 Minutes Intravenous  Once 09/09/22 0245 09/09/22 0521   09/09/22 0330  vancomycin (VANCOCIN) IVPB 1000 mg/200 mL premix  Status:  Discontinued        1,000 mg 200 mL/hr over 60 Minutes Intravenous  Once 09/09/22 0242 09/09/22 0245   09/09/22 0330  Ampicillin-Sulbactam (UNASYN) 3 g in sodium chloride 0.9 % 100 mL IVPB  Status:  Discontinued        3 g 200 mL/hr over 30 Minutes Intravenous Every 6 hours 09/09/22 0242 09/09/22 0246   09/09/22 0230  Ampicillin-Sulbactam (UNASYN) 3 g in sodium chloride 0.9 % 100 mL IVPB        3 g 200 mL/hr over 30 Minutes Intravenous Every 6 hours 09/09/22 0140     09/08/22 2300  Ampicillin-Sulbactam (UNASYN) 3 g in sodium chloride 0.9 % 100 mL IVPB  Status:  Discontinued        3 g 200 mL/hr over 30 Minutes Intravenous Every 6 hours 09/08/22 2254 09/09/22 0139   09/08/22 2100  vancomycin (VANCOCIN) IVPB 1000 mg/200 mL premix  Status:  Discontinued        1,000 mg 200 mL/hr over 60 Minutes Intravenous  Once 09/08/22 2054 09/08/22 2101   09/08/22 1915  Ampicillin-Sulbactam (UNASYN) 3 g in sodium chloride 0.9 % 100 mL IVPB        3 g 200 mL/hr over 30 Minutes Intravenous  Once 09/08/22 1904 09/08/22 2012          Medications  enoxaparin (LOVENOX) injection  40 mg Subcutaneous Q24H   insulin aspart  0-15 Units Subcutaneous TID WC   insulin aspart  0-5 Units Subcutaneous QHS   loratadine  10 mg Oral Daily   melatonin  3 mg Oral QHS   montelukast  10 mg Oral QHS   naproxen  375 mg Oral BID   pantoprazole  40 mg Oral Daily      Subjective:   Erika Rhodes was seen and examined today.  Denied any specific complaints except no significant improvement in the facial cellulitis since admission.  Left-sided face swelling with 2 indurated areas.  Denies chest pain, shortness of breath, abdominal pain, N/V/D/C.   Objective:   Vitals:   09/08/22 2218 09/09/22  0156 09/09/22 0559 09/09/22 1011  BP: 128/89 (!) 120/91 (!) 143/90 (!) 153/100  Pulse: 99 92 95 (!) 116  Resp: 18 20 16    Temp: 98.6 F (37 C) 97.8 F (36.6 C) 97.7 F (36.5 C) 98.3 F (36.8 C)  TempSrc: Oral Oral  Oral  SpO2: 100% 97% 99% 99%  Weight:  80.2 kg    Height:  Intake/Output Summary (Last 24 hours) at 09/09/2022 1024 Last data filed at 09/09/2022 0908 Gross per 24 hour  Intake 572 ml  Output --  Net 572 ml     Wt Readings from Last 3 Encounters:  09/09/22 80.2 kg  08/03/22 77.1 kg  08/01/22 77.1 kg     Exam General: Alert and oriented x 3, NAD Cardiovascular: S1 S2 auscultated,  RRR Respiratory: Clear to auscultation bilaterally Gastrointestinal: Soft, nontender, nondistended, + bowel sounds Ext: no pedal edema bilaterally Neuro: Strength 5/5 upper and lower extremities bilaterally Musculoskeletal: No digital cyanosis, clubbing Skin: Left diffuse facial swelling with associated tenderness, redness and small indurated areas under the chin and the cheek. Psych: anxious     Data Reviewed:  I have personally reviewed following labs    CBC Lab Results  Component Value Date   WBC 11.3 (H) 09/09/2022   RBC 4.39 09/09/2022   HGB 13.7 09/09/2022   HCT 39.0 09/09/2022   MCV 88.8 09/09/2022   MCH 31.2 09/09/2022   PLT 283 09/09/2022   MCHC 35.1 09/09/2022   RDW 12.2 09/09/2022   LYMPHSABS 2.6 09/08/2022   MONOABS 0.6 09/08/2022   EOSABS 0.1 09/08/2022   BASOSABS 0.1 09/08/2022     Last metabolic panel Lab Results  Component Value Date   NA 138 09/09/2022   K 3.4 (L) 09/09/2022   CL 105 09/09/2022   CO2 25 09/09/2022   BUN 9 09/09/2022   CREATININE 0.81 09/09/2022   GLUCOSE 140 (H) 09/09/2022   GFRNONAA >60 09/09/2022   GFRAA 96 06/13/2020   CALCIUM 8.5 (L) 09/09/2022   PROT 7.8 12/26/2021   ALBUMIN 4.2 12/26/2021   LABGLOB 2.5 06/13/2020   AGRATIO 1.8 06/13/2020   BILITOT 0.7 12/26/2021   ALKPHOS 82 12/26/2021   AST 24  12/26/2021   ALT 32 12/26/2021   ANIONGAP 8 09/09/2022    CBG (last 3)  Recent Labs    09/09/22 0818  GLUCAP 122*      Coagulation Profile: Recent Labs  Lab 09/09/22 0512  INR 1.1     Radiology Studies: I have personally reviewed the imaging studies  CT Soft Tissue Neck W Contrast  Result Date: 09/08/2022 CLINICAL DATA:  Initial evaluation for soft tissue swelling, infection suspected. EXAM: CT NECK WITH CONTRAST TECHNIQUE: Multidetector CT imaging of the neck was performed using the standard protocol following the bolus administration of intravenous contrast. RADIATION DOSE REDUCTION: This exam was performed according to the departmental dose-optimization program which includes automated exposure control, adjustment of the mA and/or kV according to patient size and/or use of iterative reconstruction technique. CONTRAST:  35mL OMNIPAQUE IOHEXOL 300 MG/ML  SOLN COMPARISON:  Prior study from 10/18/2021. FINDINGS: Pharynx and larynx: Oral cavity within normal limits. Oropharynx and nasopharynx unremarkable. No retropharyngeal collection or swelling. Negative epiglottis. Hypopharynx and supraglottic larynx within normal limits. Glottis symmetric and normal. Subglottic airway patent clear. Salivary glands: Diffuse soft tissue swelling seen involving the soft tissues of the left face. Changes involve the left infraorbital region, pre maxillary region, as well as the left parotid, masticator, and submandibular spaces. Inferior extension along the anterior neck towards the midline. Overlying skin thickening noted. Findings concerning for acute infection/cellulitis. Ill-defined soft tissue stranding/phlegmonous change seen within these regions, but with no discrete abscess or drainable fluid collection. Few prominent intraparotid lymph nodes at the left parotid gland noted, presumably reactive. Remainder of the salivary glands including the right parotid gland and submandibular glands are within  normal limits. Thyroid:  Normal. Lymph nodes: Prominent upper cervical lymph nodes, largest of which measures 1.2 cm at level 2, presumably reactive. Vascular: Normal intravascular enhancement seen throughout the neck. Limited intracranial: Unremarkable. Visualized orbits: Unremarkable. No evidence for intraorbital or postseptal cellulitis. Mastoids and visualized paranasal sinuses: Paranasal sinuses are clear. Mastoid air cells and middle ear cavities are well pneumatized and free of fluid. Skeleton: No discrete or worrisome osseous lesions. Upper chest: Visualized upper chest demonstrates no acute finding. Other: None. IMPRESSION: 1. Diffuse soft tissue swelling with inflammatory stranding involving the left face with inferior extension along the left anterior neck as above, concerning for acute infection/cellulitis. Ill-defined soft tissue stranding/phlegmonous change within these regions, but with no organized collection or discrete abscess identified. 2. Prominent upper cervical lymph nodes, presumably reactive. Electronically Signed   By: Rise Mu M.D.   On: 09/08/2022 19:56       Mihcael Ledee M.D. [facility] 09/09/2022, 10:24 AM  Available via Epic secure chat 7am-7pm After 7 pm, please refer to night coverage provider listed on amion.

## 2022-09-09 NOTE — ED Notes (Signed)
Date and time results received: 09/09/22 0105 (use smartphrase ".now" to insert current time)  Test: MRSA Critical Value: positive  Name of Provider Notified: Dr Florina Ou  Orders Received? Or Actions Taken?:  n/a

## 2022-09-09 NOTE — Assessment & Plan Note (Signed)
-   The patient will be admitted to a medical is directly admitted to a medical bed. - We will continue antibiotic therapy with IV Unasyn for the possibility of underlying teeth infections and vancomycin. - Pain management will be provided. - ENT consult can be called in AM.

## 2022-09-09 NOTE — Assessment & Plan Note (Signed)
-   We will continue PPI therapy 

## 2022-09-09 NOTE — Consult Note (Signed)
[facility] Face-to-Face Psychiatry Consult   Reason for Consult:  anxiety impeding her medical care she is receiving/ trust issues with healthcare system.  Referring Physician:  Dr. Isidoro Donning Patient Identification: Erika Rhodes MRN:  [id number] Principal Diagnosis: Sepsis due to cellulitis [facility]) Diagnosis:  Principal Problem:   Sepsis due to cellulitis [facility]) Active Problems:   Rheumatoid arthritis (HCC)   Anxiety   Asthma   Facial cellulitis   GERD without esophagitis   Uncontrolled type 2 diabetes mellitus with hyperglycemia, without long-term current use of insulin (HCC)   Total Time spent with patient: 30 minutes  Subjective:   Erika Rhodes is a 41 y.o. female patient admitted for facial cellulitis and swelling, tooth abscess unresponsive to oral antibiotics.  Psych consult was placed for anxiety impeding health care, trust issues with health care, requesting to leave.  On today's evaluation patient is alert and oriented x4, cooperative, irritable voicing multiple complaints about past history, distrust healthcare providers, and waiting on her boyfriend to come.  "  Patient reports being frustrated with her current treatment of facial cellulitis, noting she has taken multiple antibiotics with no relief.  She reports using she presents to [facility] for her medical treatment, while only coming to [facility] for mental health treatment.  Patient does not present with symptoms of mania today, she does not present with tangential thought process, paranoia, psychosis. When assessing for suicidality today patient denies and then states" if this infection gets me sick, then It would be a natural cause of death, I would not do something to myself."   Patient does not present with any overt psychosis, mania, does not appear to be responding to internal stimuli or external stimuli.    Patient is well-known to the behavioral health service line, as she would frequently visit our emergency  rooms with psychiatric complaints.  However on this admission she was admitted for facial cellulitis, no concerns of acute psychiatric presentation.  Her current presentation of increased tangentiality, anxiety, delusions are pretty consistent with her previous emergency room visits and normal presentation.  There does not appear to be any concern for malingering or secondary gain at this time, as she does in fact have ongoing facial cellulitis that will require IV antibiotics due to oral treatment failure.   She is able to exercise self-control, adequate judgement, and discretion in the conduct of daily responsibilities, ongoing medication management and medication adjustment. It is felt that patient has capacity to leave AMA.  Outpatient support is in place in order to provide safe discharge and ensure appropriate, consistent aftercare as long as patient is willing to engage, attend follow up, and be compliant with treatment plan. Will psych clear at this time.   HPI:  Erika Rhodes is a 41 y.o. cog patient female with medical history significant for anxiety, depression, asthma, migraine, rheumatoid arthritis, steroid induced type 2 diabetes, who presented to the ER with acute onset of worsening left facial swelling.  The patient stated that she had a upper jaw tooth abscess weeks ago for which she was given Augmentin and it almost cleared up then.  She later became sick with URI symptoms and sinus infection with nasal congestion.  She started having worsening left cheek swelling with erythema as well as upper and lower jaw pain with associated purulent skin drainage.  Her swelling extended up to her eye and down to her neck.  She had a televisit with doctors on demand and was prescribed p.o. doxycycline which  she took over the last couple of days.  She continued to have worsening symptoms and experienced tactile fever and chills with diaphoresis.  She admitted to nausea and vomiting.  She did not experience  any hyperglycemia.  She has been having postnasal drip with associated cough without wheezing or dyspnea.  No chest pain or palpitations.   Past Psychiatric History:   -anxiety  -depression  -Borderline personality disorder  Risk to Self:  No Risk to Others:  No Prior Inpatient Therapy:  yes Prior Outpatient Therapy:  yes  Past Medical History:  Past Medical History:  Diagnosis Date   Allergic reaction    Anxiety    Asthma    Depression    DM (diabetes mellitus), gestational    Migraines    Rheumatoid arthritis (HCC) 2016   Diagnosed Dr.  Patsi Sears; has been treated with MTX, Humira, etc, but made her sick.  Was taking CBD oil and stopped as tested positive in a drug screen for work.    Past Surgical History:  Procedure Laterality Date   ABDOMINAL HYSTERECTOMY  2017   Fibroids; Both ovaries intact still   BACK SURGERY     CESAREAN SECTION  2009   CHOLECYSTECTOMY  2010   laparoscopic   TONSILLECTOMY     Family History:  Family History  Problem Relation Age of Onset   Arthritis Mother        Rheumatoid   Depression Daughter        and anxiety   Allergies Son    Family Psychiatric  History: not noted Social History:  Social History   Substance and Sexual Activity  Alcohol Use Yes   Alcohol/week: 2.0 standard drinks of alcohol   Types: 2 Glasses of wine per week   Comment: occ     Social History   Substance and Sexual Activity  Drug Use Yes   Types: Marijuana   Comment: "cannabis delta 10"    Social History   Socioeconomic History   Marital status: Single    Spouse name: Not on file   Number of children: 5   Years of education: 14   Highest education level: Associate degree: academic program  Occupational History   Not on file  Tobacco Use   Smoking status: Never   Smokeless tobacco: Never  Vaping Use   Vaping Use: Some days  Substance and Sexual Activity   Alcohol use: Yes    Alcohol/week: 2.0 standard drinks of alcohol    Types: 2  Glasses of wine per week    Comment: occ   Drug use: Yes    Types: Marijuana    Comment: "cannabis delta 10"   Sexual activity: Yes    Birth control/protection: Surgical  Other Topics Concern   Not on file  Social History Narrative   Lives with current boyfriend   See history of present illness for more history 04/22/2020   Social Determinants of Health   Financial Resource Strain: Not on file  Food Insecurity: Not on file  Transportation Needs: Not on file  Physical Activity: Not on file  Stress: Not on file  Social Connections: Not on file   Additional Social History:    Allergies:   Allergies  Allergen Reactions   Z-Pak [Azithromycin] Anaphylaxis    Labs:  Results for orders placed or performed during the hospital encounter of 09/08/22 (from the past 48 hour(s))  CBC with Differential     Status: Abnormal   Collection Time: 09/08/22  6:28  PM  Result Value Ref Range   WBC 14.2 (H) 4.0 - 10.5 K/uL   RBC 4.68 3.87 - 5.11 MIL/uL   Hemoglobin 14.5 12.0 - 15.0 g/dL   HCT 31.5 17.6 - 16.0 %   MCV 87.4 80.0 - 100.0 fL   MCH 31.0 26.0 - 34.0 pg   MCHC 35.5 30.0 - 36.0 g/dL   RDW 73.7 10.6 - 26.9 %   Platelets 324 150 - 400 K/uL   nRBC 0.0 0.0 - 0.2 %   Neutrophils Relative % 77 %   Neutro Abs 10.8 (H) 1.7 - 7.7 K/uL   Lymphocytes Relative 18 %   Lymphs Abs 2.6 0.7 - 4.0 K/uL   Monocytes Relative 4 %   Monocytes Absolute 0.6 0.1 - 1.0 K/uL   Eosinophils Relative 1 %   Eosinophils Absolute 0.1 0.0 - 0.5 K/uL   Basophils Relative 0 %   Basophils Absolute 0.1 0.0 - 0.1 K/uL   Immature Granulocytes 0 %   Abs Immature Granulocytes 0.05 0.00 - 0.07 K/uL    Comment: Performed at [facility], [address] [place], [place] [address]  Basic metabolic panel     Status: Abnormal   Collection Time: 09/08/22  6:29 PM  Result Value Ref Range   Sodium 135 135 - 145 mmol/L   Potassium 4.0 3.5 - 5.1 mmol/L   Chloride 102 98 - 111 mmol/L   CO2 25 22 - 32 mmol/L    Glucose, Bld 214 (H) 70 - 99 mg/dL    Comment: Glucose reference range applies only to samples taken after fasting for at least 8 hours.   BUN 7 6 - 20 mg/dL   Creatinine, Ser 2.70 0.44 - 1.00 mg/dL   Calcium 8.6 (L) 8.9 - 10.3 mg/dL   GFR, Estimated >35 >00 mL/min    Comment: (NOTE) Calculated using the CKD-EPI Creatinine Equation (2021)    Anion gap 8 5 - 15    Comment: Performed at [facility], [address] [place], [place] [address]  hCG, serum, qualitative     Status: None   Collection Time: 09/08/22  6:29 PM  Result Value Ref Range   Preg, Serum NEGATIVE NEGATIVE    Comment:        THE SENSITIVITY OF THIS METHODOLOGY IS >10 mIU/mL. Performed at [facility], [address] [place], [place] [address]   MRSA Next Gen by PCR, Nasal     Status: Abnormal   Collection Time: 09/08/22  9:01 PM   Specimen: Nasal Mucosa; Nasal Swab  Result Value Ref Range   MRSA by PCR Next Gen DETECTED (A) NOT DETECTED    Comment: RESULTS CALLED TO, READ BACK BY AND VERIFIED WITH RN C.WALLINGTON @0105  ON 09/09/22 BY NM (NOTE) The GeneXpert MRSA Assay (FDA approved for NASAL specimens only), is one component of a comprehensive MRSA colonization surveillance program. It is not intended to diagnose MRSA infection nor to guide or monitor treatment for MRSA infections. Test performance is not FDA approved in patients less than 40 years old. Performed at [facility], [address] [place], [place]   HIV Antibody (routine testing w rflx)     Status: None   Collection Time: 09/09/22  5:12 AM  Result Value Ref Range   HIV Screen 4th Generation wRfx Non Reactive Non Reactive    Comment: Performed at [facility], [address] [place], [place]  [place]     Status: None   Collection Time: 09/09/22  5:12 AM  Result Value Ref Range   Prothrombin Time 14.4 11.4 - 15.2 seconds   INR 1.1 0.8 - 1.2    Comment: (NOTE) INR goal varies based on  device and disease states. Performed at [facility], [address] [place], [place] [address]   Cortisol-am, blood     Status: Abnormal   Collection Time: 09/09/22  5:12 AM  Result Value Ref Range   Cortisol - AM 3.5 (L) 6.7 - 22.6 ug/dL    Comment: Performed at [facility] [address], [place], [place] [address]  Procalcitonin     Status: None   Collection Time: 09/09/22  5:12 AM  Result Value Ref Range   Procalcitonin <0.10 ng/mL    Comment:        Interpretation: PCT (Procalcitonin) <= 0.5 ng/mL: Systemic infection (sepsis) is not likely. Local bacterial infection is possible. (NOTE)       Sepsis PCT Algorithm           Lower Respiratory Tract                                      Infection PCT Algorithm    ----------------------------     ----------------------------         PCT < 0.25 ng/mL                PCT < 0.10 ng/mL          Strongly encourage             Strongly discourage   discontinuation of antibiotics    initiation of antibiotics    ----------------------------     -----------------------------       PCT 0.25 - 0.50 ng/mL            PCT 0.10 - 0.25 ng/mL               OR       >80% decrease in PCT            Discourage initiation of                                            antibiotics      Encourage discontinuation           of antibiotics    ----------------------------     -----------------------------         PCT >= 0.50 ng/mL              PCT 0.26 - 0.50 ng/mL               AND        <80% decrease in PCT             Encourage initiation of                                             antibiotics       Encourage continuation           of antibiotics    ----------------------------     -----------------------------  PCT >= 0.50 ng/mL                  PCT > 0.50 ng/mL               AND         increase in PCT                  Strongly encourage                                      initiation of antibiotics     Strongly encourage escalation           of antibiotics                                     -----------------------------                                           PCT <= 0.25 ng/mL                                                 OR                                        > 80% decrease in PCT                                      Discontinue / Do not initiate                                             antibiotics  Performed at [facility], [address] [place], [place] [address]   Basic metabolic panel     Status: Abnormal   Collection Time: 09/09/22  5:12 AM  Result Value Ref Range   Sodium 138 135 - 145 mmol/L   Potassium 3.4 (L) 3.5 - 5.1 mmol/L   Chloride 105 98 - 111 mmol/L   CO2 25 22 - 32 mmol/L   Glucose, Bld 140 (H) 70 - 99 mg/dL    Comment: Glucose reference range applies only to samples taken after fasting for at least 8 hours.   BUN 9 6 - 20 mg/dL   Creatinine, Ser 6.57 0.44 - 1.00 mg/dL   Calcium 8.5 (L) 8.9 - 10.3 mg/dL   GFR, Estimated >84 >69 mL/min    Comment: (NOTE) Calculated using the CKD-EPI Creatinine Equation (2021)    Anion gap 8 5 - 15    Comment: Performed at [facility], [address], [place], [place] [address]  CBC     Status: Abnormal   Collection Time: 09/09/22  5:12 AM  Result Value Ref Range   WBC 11.3 (H) 4.0 - 10.5 K/uL   RBC 4.39 3.87 - 5.11  MIL/uL   Hemoglobin 13.7 12.0 - 15.0 g/dL   HCT 15.4 00.8 - 67.6 %   MCV 88.8 80.0 - 100.0 fL   MCH 31.2 26.0 - 34.0 pg   MCHC 35.1 30.0 - 36.0 g/dL   RDW 19.5 09.3 - 26.7 %   Platelets 283 150 - 400 K/uL   nRBC 0.0 0.0 - 0.2 %    Comment: Performed at [facility], [address] [place], [place] [address]  Hemoglobin A1c     Status: Abnormal   Collection Time: 09/09/22  5:12 AM  Result Value Ref Range   Hgb A1c MFr Bld 8.3 (H) 4.8 - 5.6 %    Comment: (NOTE) Pre diabetes:          5.7%-6.4%  Diabetes:               >6.4%  Glycemic control for   <7.0% adults with diabetes    Mean Plasma Glucose 191.51 mg/dL    Comment: Performed at [facility], [address], [place], [place] [address]  Glucose, capillary     Status: Abnormal   Collection Time: 09/09/22  8:18 AM  Result Value Ref Range   Glucose-Capillary 122 (H) 70 - 99 mg/dL    Comment: Glucose reference range applies only to samples taken after fasting for at least 8 hours.    Current Facility-Administered Medications  Medication Dose Route Frequency Provider Last Rate Last Admin   acetaminophen (TYLENOL) tablet 650 mg  650 mg Oral Q6H PRN Mansy, Jan A, MD       Or   acetaminophen (TYLENOL) suppository 650 mg  650 mg Rectal Q6H PRN Mansy, Jan A, MD       albuterol (PROVENTIL) (2.5 MG/3ML) 0.083% nebulizer solution 2.5 mg  2.5 mg Nebulization Q6H PRN Mansy, Jan A, MD       Ampicillin-Sulbactam (UNASYN) 3 g in sodium chloride 0.9 % 100 mL IVPB  3 g Intravenous Q6H Terald Sleeper, MD 200 mL/hr at 09/09/22 1533 3 g at 09/09/22 1533   diazepam (VALIUM) tablet 5 mg  5 mg Oral Q6H PRN Small, Brooke L, PA   5 mg at 09/09/22 1546   enoxaparin (LOVENOX) injection 40 mg  40 mg Subcutaneous Q24H Mansy, Jan A, MD       hydrOXYzine (ATARAX) tablet 25 mg  25 mg Oral Q6H PRN Mansy, Jan A, MD       insulin aspart (novoLOG) injection 0-15 Units  0-15 Units Subcutaneous TID WC Mansy, Jan A, MD       insulin aspart (novoLOG) injection 0-5 Units  0-5 Units Subcutaneous QHS Mansy, Jan A, MD       loratadine (CLARITIN) tablet 10 mg  10 mg Oral Daily Mansy, Jan A, MD   10 mg at 09/09/22 0908   magnesium hydroxide (MILK OF MAGNESIA) suspension 30 mL  30 mL Oral Daily PRN Mansy, Jan A, MD       melatonin tablet 3 mg  3 mg Oral QHS Small, Brooke L, PA       metoCLOPramide (REGLAN) injection 5 mg  5 mg Intravenous Q6H PRN Mansy, Jan A, MD       montelukast (SINGULAIR) tablet 10 mg  10 mg Oral QHS Small, Brooke L, PA       morphine (PF) 2 MG/ML injection 2 mg  2  mg Intravenous Q4H PRN Mansy, Jan A, MD   2 mg at 09/09/22 1416   naproxen (NAPROSYN)  tablet 375 mg  375 mg Oral BID Small, Brooke L, PA   375 mg at 09/09/22 1478   oxyCODONE (Oxy IR/ROXICODONE) immediate release tablet 5 mg  5 mg Oral TID PRN Small, Brooke L, PA   5 mg at 09/09/22 0923   pantoprazole (PROTONIX) EC tablet 40 mg  40 mg Oral Daily Small, Brooke L, PA   40 mg at 09/09/22 0908   potassium chloride 10 mEq in 100 mL IVPB  10 mEq Intravenous Q1 Hr x 3 Rai, Ripudeep K, MD 100 mL/hr at 09/09/22 1533 10 mEq at 09/09/22 1533   traZODone (DESYREL) tablet 25 mg  25 mg Oral QHS PRN Mansy, Jan A, MD       vancomycin (VANCOREADY) IVPB 750 mg/150 mL  750 mg Intravenous Q12H Maurice March, [facility]        Musculoskeletal: Strength & Muscle Tone: within normal limits Gait & Station: normal Patient leans: N/A  Psychiatric Specialty Exam:  Presentation  General Appearance: Appropriate for Environment; Casual  Eye Contact:Good  Speech:Clear and Coherent; Pressured  Speech Volume:Normal  Handedness:Right   Mood and Affect  Mood:Euphoric  Affect:Appropriate; Congruent   Thought Process  Thought Processes:Coherent; Goal Directed  Descriptions of Associations:Tangential  Orientation:Full (Time, Place and Person)  Thought Content:Tangential  History of Schizophrenia/Schizoaffective disorder:No Duration of Psychotic Symptoms:N/A  Hallucinations:Hallucinations: None Ideas of Reference:None  Suicidal Thoughts:Suicidal Thoughts: No Homicidal Thoughts:Homicidal Thoughts: No  Sensorium  Memory:Immediate Good; Recent Good; Remote Fair  Judgment:Fair  Insight:Fair   Executive Functions  Concentration:Fair  Attention Span:Fair  Recall:Fair  Fund of Knowledge:Fair  Language:Fair   Psychomotor Activity  Psychomotor Activity: Psychomotor Activity: Normal  Assets  Assets:Communication Skills; Desire for Improvement; Financial Resources/Insurance; Physical Health;  Leisure Time   Sleep  Sleep: Sleep: Fair  Physical Exam: Physical Exam Vitals and nursing note reviewed.  Constitutional:      General: She is not in acute distress.    Appearance: Normal appearance. She is not ill-appearing, toxic-appearing or diaphoretic.  HENT:     Head: Normocephalic.     Nose: Nose normal.     Mouth/Throat:     Mouth: Mucous membranes are moist.     Pharynx: Oropharynx is clear.  Eyes:     Pupils: Pupils are equal, round, and reactive to light.  Cardiovascular:     Rate and Rhythm: Normal rate.     Pulses: Normal pulses.  Pulmonary:     Effort: Pulmonary effort is normal.  Musculoskeletal:        General: Normal range of motion.     Cervical back: Normal range of motion.  Skin:    General: Skin is warm and dry.  Neurological:     General: No focal deficit present.     Mental Status: She is alert and oriented to person, place, and time. Mental status is at baseline.  Psychiatric:        Attention and Perception: Attention and perception normal. She is attentive.        Mood and Affect: Mood normal.        Speech: Speech normal. Speech is not tangential.        Behavior: Behavior normal. Behavior is cooperative.        Thought Content: Thought content normal. Thought content is not delusional. Suicidal: denies, suicidal if discharged.        Judgment: Judgment is impulsive. Judgment is not inappropriate.    Review of Systems  Psychiatric/Behavioral:  Negative for substance abuse.  All other systems reviewed and are negative.  Blood pressure (!) 142/117, pulse (!) 104, temperature 98.3 F (36.8 C), temperature source Oral, resp. rate (!) 22, height 5' 3.5" (1.613 m), weight 80.2 kg, SpO2 98 %. Body mass index is 30.83 kg/m.  Treatment Plan Summary: Will psychiatrically clear at this time -Continue current recommendations for medical team to include IV antibiotics as appropriate. -Patient is open to ENT referral, as well as bedside I&D.   Patient is not comfortable with anesthesia, however may be open if significant other can be present. -Continue as needed diazepam for anxiety, in addition to hydroxyzine tablets 25 mg. -Patient does not appear to be of acute and or recent threat to self and or others, she does not meet criteria for [facility] involuntary commitment.   Psych consult service to sign off at this time.  Thank you for this interesting consult.   Maryagnes Amos, FNP 09/09/2022 4:49 PM

## 2022-09-09 NOTE — Progress Notes (Signed)
   09/09/22 1011  Vitals  Temp 98.3 F (36.8 C)  Temp Source Oral  BP (!) 153/100 Raquel Sarna LPN notified of high BP; pt is agitated)  MAP (mmHg) 119  BP Location Left Arm  BP Method Automatic  Patient Position (if appropriate) Sitting  Pulse Rate (!) 116  Pulse Rate Source Monitor  Resp (!) 22  Level of Consciousness  Level of Consciousness Alert  MEWS COLOR  MEWS Score Color Yellow  Oxygen Therapy  SpO2 99 %  O2 Device Room Air  MEWS Score  MEWS Temp 0  MEWS Systolic 0  MEWS Pulse 2  MEWS RR 1  MEWS LOC 0  MEWS Score 3   Patient in a yellow MEWS due to elevated heart and blood pressure due to agitation. Charge nurse Azzie Almas RN notified of yellow MEWS. Patient has left against medical advice.

## 2022-09-09 NOTE — H&P (Addendum)
[place]   PATIENT NAME: Erika Rhodes    MR#:  [id number]  DATE OF BIRTH:  03-06-1981  DATE OF ADMISSION:  09/08/2022  PRIMARY CARE PHYSICIAN: Patient, No Pcp Per   Patient is coming from: Home  REQUESTING/REFERRING PHYSICIAN: Small, Harley [place], PA [facility] ED)  CHIEF COMPLAINT:   Chief Complaint  Patient presents with   Dental Pain   Facial Swelling    HISTORY OF PRESENT ILLNESS:  Erika Rhodes is a 41 y.o. cog patient female with medical history significant for anxiety, depression, asthma, migraine, rheumatoid arthritis, steroid induced type 2 diabetes, who presented to the ER with acute onset of worsening left facial swelling.  The patient stated that she had a upper jaw tooth abscess weeks ago for which she was given Augmentin and it almost cleared up then.  She later became sick with URI symptoms and sinus infection with nasal congestion.  She started having worsening left cheek swelling with erythema as well as upper and lower jaw pain with associated purulent skin drainage.  Her swelling extended up to her eye and down to her neck.  She had a televisit with doctors on demand and was prescribed p.o. doxycycline which she took over the last couple of days.  She continued to have worsening symptoms and experienced tactile fever and chills with diaphoresis.  She admitted to nausea and vomiting.  She did not experience any hyperglycemia.  She has been having postnasal drip with associated cough without wheezing or dyspnea.  No chest pain or palpitations.  ED Course: When she came to the ER, BP was 160/91 with otherwise normal vital signs.  Here her BP was 128/89 with otherwise normal vital signs.  Labs revealed blood glucose of 214 with calcium of 8.6 and otherwise unremarkable BMP.  CBC showed leukocytosis of 14.2 with neutrophilia.  Serum pregnancy test was negative.  MRSA PCR was up was detected Imaging: CT soft tissue neck With contrast revealed the following: 1. Diffuse  soft tissue swelling with inflammatory stranding involving the left face with inferior extension along the left anterior neck as above, concerning for acute infection/cellulitis. Ill-defined soft tissue stranding/phlegmonous change within these regions, but with no organized collection or discrete abscess identified. 2. Prominent upper cervical lymph nodes, presumably reactive.  The patient was given IV Unasyn, Benadryl, 0.5 mg of IV Dilaudid and 50 mg of IV Toradol as well as 4 mg of IV Zofran.  She is directly admitted to a medical bed for further evaluation and management.  PAST MEDICAL HISTORY:   Past Medical History:  Diagnosis Date   Allergic reaction    Anxiety    Asthma    Depression    DM (diabetes mellitus), gestational    Migraines    Rheumatoid arthritis (HCC) 2016   Diagnosed Dr.  Patsi Sears; has been treated with MTX, Humira, etc, but made her sick.  Was taking CBD oil and stopped as tested positive in a drug screen for work.    PAST SURGICAL HISTORY:   Past Surgical History:  Procedure Laterality Date   ABDOMINAL HYSTERECTOMY  2017   Fibroids; Both ovaries intact still   BACK SURGERY     CESAREAN SECTION  2009   CHOLECYSTECTOMY  2010   laparoscopic   TONSILLECTOMY      SOCIAL HISTORY:   Social History   Tobacco Use   Smoking status: Never   Smokeless tobacco: Never  Substance Use Topics   Alcohol use: Yes  Alcohol/week: 2.0 standard drinks of alcohol    Types: 2 Glasses of wine per week    Comment: occ    FAMILY HISTORY:   Family History  Problem Relation Age of Onset   Arthritis Mother        Rheumatoid   Depression Daughter        and anxiety   Allergies Son     DRUG ALLERGIES:   Allergies  Allergen Reactions   Azithromycin Anaphylaxis    REVIEW OF SYSTEMS:   ROS As per history of present illness. All pertinent systems were reviewed above. Constitutional, HEENT, cardiovascular, respiratory, GI, GU, musculoskeletal, neuro,  psychiatric, endocrine, integumentary and hematologic systems were reviewed and are otherwise negative/unremarkable except for positive findings mentioned above in the HPI.   MEDICATIONS AT HOME:   Prior to Admission medications   Medication Sig Start Date End Date Taking? Authorizing Provider  albuterol (PROVENTIL) (2.5 MG/3ML) 0.083% nebulizer solution Take 3 mLs (2.5 mg total) by nebulization every 6 (six) hours as needed for wheezing or shortness of breath. 07/26/22  Yes Kathe Becton R, PA-C  albuterol (VENTOLIN HFA) 108 (90 Base) MCG/ACT inhaler Inhale 1-2 puffs into the lungs every 4 (four) hours as needed for wheezing or shortness of breath. 01/15/21  Yes Ethelene Hal, NP  fexofenadine [facility] ALLERGY) 180 MG tablet Take 180 mg by mouth daily.   Yes [provider]  omeprazole (PRILOSEC) 20 MG capsule Take 20 mg by mouth daily.   Yes [provider]  predniSONE (DELTASONE) 20 MG tablet 2 tabs po daily x 4 days 08/03/22  Yes Sherwood Gambler, MD  diazepam (VALIUM) 5 MG tablet Take 1 tablet (5 mg total) by mouth every 6 (six) hours as needed for anxiety. 08/01/22   Molpus, John, MD  hydrOXYzine (ATARAX) 25 MG tablet Take 1 tablet (25 mg total) by mouth every 6 (six) hours as needed for anxiety. 08/03/22   Sherwood Gambler, MD      VITAL SIGNS:  Blood pressure (!) 120/91, pulse 92, temperature 97.8 F (36.6 C), temperature source Oral, resp. rate 20, height 5' 3.5" (1.613 m), weight 80.2 kg, SpO2 97 %.  PHYSICAL EXAMINATION:  Physical Exam  GENERAL:  41 y.o.-year-old Caucasian female patient lying in the bed with no acute distress.  EYES: Pupils equal, round, reactive to light and accommodation. No scleral icterus. Extraocular muscles intact.  HEENT: Head atraumatic, normocephalic.  Facial skin as below.  Oropharynx and nasopharynx clear.  NECK:  Supple, no jugular venous distention. No thyroid enlargement, no tenderness.  LUNGS: Normal breath sounds bilaterally,  no wheezing, rales,rhonchi or crepitation. No use of accessory muscles of respiration.  CARDIOVASCULAR: Regular rate and rhythm, S1, S2 normal. No murmurs, rubs, or gallops.  ABDOMEN: Soft, nondistended, nontender. Bowel sounds present. No organomegaly or mass.  EXTREMITIES: No pedal edema, cyanosis, or clubbing.  NEUROLOGIC: Cranial nerves II through XII are intact. Muscle strength 5/5 in all extremities. Sensation intact. Gait not checked.  PSYCHIATRIC: The patient is alert and oriented x 3.  Normal affect and good eye contact. SKIN: Left diffuse facial swelling with associated erythema and tenderness with a couple of pustular eruption as shown below and extension to below her eyes and to her neck with associated and duration up to her ears as well.   LABORATORY PANEL:   CBC Recent Labs  Lab 09/09/22 0512  WBC 11.3*  HGB 13.7  HCT 39.0  PLT 283   ------------------------------------------------------------------------------------------------------------------  Chemistries  Recent Labs  Lab 09/08/22 1829  NA 135  K 4.0  CL 102  CO2 25  GLUCOSE 214*  BUN 7  CREATININE 0.72  CALCIUM 8.6*   ------------------------------------------------------------------------------------------------------------------  Cardiac Enzymes No results for input(s): "TROPONINI" in the last 168 hours. ------------------------------------------------------------------------------------------------------------------  RADIOLOGY:  CT Soft Tissue Neck W Contrast  Result Date: 09/08/2022 CLINICAL DATA:  Initial evaluation for soft tissue swelling, infection suspected. EXAM: CT NECK WITH CONTRAST TECHNIQUE: Multidetector CT imaging of the neck was performed using the standard protocol following the bolus administration of intravenous contrast. RADIATION DOSE REDUCTION: This exam was performed according to the departmental dose-optimization program which includes automated exposure control, adjustment of  the mA and/or kV according to patient size and/or use of iterative reconstruction technique. CONTRAST:  69mL OMNIPAQUE IOHEXOL 300 MG/ML  SOLN COMPARISON:  Prior study from 10/18/2021. FINDINGS: Pharynx and larynx: Oral cavity within normal limits. Oropharynx and nasopharynx unremarkable. No retropharyngeal collection or swelling. Negative epiglottis. Hypopharynx and supraglottic larynx within normal limits. Glottis symmetric and normal. Subglottic airway patent clear. Salivary glands: Diffuse soft tissue swelling seen involving the soft tissues of the left face. Changes involve the left infraorbital region, pre maxillary region, as well as the left parotid, masticator, and submandibular spaces. Inferior extension along the anterior neck towards the midline. Overlying skin thickening noted. Findings concerning for acute infection/cellulitis. Ill-defined soft tissue stranding/phlegmonous change seen within these regions, but with no discrete abscess or drainable fluid collection. Few prominent intraparotid lymph nodes at the left parotid gland noted, presumably reactive. Remainder of the salivary glands including the right parotid gland and submandibular glands are within normal limits. Thyroid: Normal. Lymph nodes: Prominent upper cervical lymph nodes, largest of which measures 1.2 cm at level 2, presumably reactive. Vascular: Normal intravascular enhancement seen throughout the neck. Limited intracranial: Unremarkable. Visualized orbits: Unremarkable. No evidence for intraorbital or postseptal cellulitis. Mastoids and visualized paranasal sinuses: Paranasal sinuses are clear. Mastoid air cells and middle ear cavities are well pneumatized and free of fluid. Skeleton: No discrete or worrisome osseous lesions. Upper chest: Visualized upper chest demonstrates no acute finding. Other: None. IMPRESSION: 1. Diffuse soft tissue swelling with inflammatory stranding involving the left face with inferior extension along the  left anterior neck as above, concerning for acute infection/cellulitis. Ill-defined soft tissue stranding/phlegmonous change within these regions, but with no organized collection or discrete abscess identified. 2. Prominent upper cervical lymph nodes, presumably reactive. Electronically Signed   By: Rise Mu M.D.   On: 09/08/2022 19:56      IMPRESSION AND PLAN:  Assessment and Plan: Facial cellulitis - The patient will be admitted to a medical is directly admitted to a medical bed. - We will continue antibiotic therapy with IV Unasyn for the possibility of underlying teeth infections and vancomycin. - Pain management will be provided. - ENT consult can be called in AM.  Uncontrolled type 2 diabetes mellitus with hyperglycemia, without long-term current use of insulin (HCC) - She will be placed on supplement coverage with NovoLog. - We will check her hemoglobin A1c.  GERD without esophagitis - We will continue PPI therapy  Sepsis due to cellulitis [facility]) - This is manifested by her leukocytosis and tachypnea. - Management as above. - She will be hydrated with IV normal saline.  Asthma - We will continue nebulizer therapy.  She has no current exacerbation.  Rheumatoid arthritis (HCC) - She is apparently in remission.       DVT prophylaxis: Lovenox.  Advanced Care Planning:  Code Status: full  code.  Family Communication:  The plan of care was discussed in details with the patient (and family). I answered all questions. The patient agreed to proceed with the above mentioned plan. Further management will depend upon hospital course. Disposition Plan: Back to previous home environment Consults called: none.  All the records are reviewed and case discussed with ED provider.  Status is: Inpatient    At the time of the admission, it appears that the appropriate admission status for this patient is inpatient.  This is judged to be reasonable and necessary in order to  provide the required intensity of service to ensure the patient's safety given the presenting symptoms, physical exam findings and initial radiographic and laboratory data in the context of comorbid conditions.  The patient requires inpatient status due to high intensity of service, high risk of further deterioration and high frequency of surveillance required.  I certify that at the time of admission, it is my clinical judgment that the patient will require inpatient hospital care extending more than 2 midnights.                            Dispo: The patient is from: Home              Anticipated d/c is to: Home              Patient currently is not medically stable to d/c.              Difficult to place patient: No  Hannah Beat M.D on 09/09/2022 at 5:56 AM  [facility]   From 7 PM-7 AM, contact night-coverage [email or web address]  CC: Primary care physician; Patient, No Pcp Per

## 2022-09-09 NOTE — Assessment & Plan Note (Signed)
-   We will continue nebulizer therapy.  She has no current exacerbation.

## 2022-09-09 NOTE — Progress Notes (Signed)
Pharmacy Antibiotic Note  Erika Rhodes is a 41 y.o. female admitted on 09/08/2022 with Left sided dental pain x 5 days. Reports dental abscess in same area. Also endorses intermittent fevers, chills. Left side of face is swollen, red, and hard. Pharmacy has been consulted for vancomycin dosing.  Plan: Vancomycin 1500mg  IV x 1 then 750mg  q12h (AUC 529.5, Scr 0.8) Follow renal function, cultures and clinical course  Height: 5' 3.5" (161.3 cm) Weight: 80.2 kg (176 lb 12.9 oz) IBW/kg (Calculated) : 53.55  Temp (24hrs), Avg:98.3 F (36.8 C), Min:97.8 F (36.6 C), Max:98.6 F (37 C)  Recent Labs  Lab 09/08/22 1828 09/08/22 1829  WBC 14.2*  --   CREATININE  --  0.72    Estimated Creatinine Clearance: 95.7 mL/min (by C-G formula based on SCr of 0.72 mg/dL).    Allergies  Allergen Reactions   Azithromycin Anaphylaxis    Antimicrobials this admission: 10/31 unasyn >> 11/1 vanc >>  Dose adjustments this admission:   Microbiology results: 10/31 MRSA PCR: positive  Thank you for allowing pharmacy to be a part of this patient's care. Dolly Rias RPh 09/09/2022, 2:49 AM

## 2022-09-09 NOTE — Progress Notes (Signed)
After speaking with patient this morning, she states that she has mistrust in the medical community due to past traumas. Patient heard on the phone stating "I just wish I could die" and wanting to leave against medical advice and just let the cellulitis and nature "take its course". I paged Dr. Estill Cotta about patients mistrust and wanting to die and leave AMA. Sheran Fava with psychiatry consulted on the patient today. Patient is leaving against medical advice as of 73.

## 2022-09-09 NOTE — Assessment & Plan Note (Signed)
-   She is apparently in remission.

## 2022-09-09 NOTE — Assessment & Plan Note (Signed)
-   She will be placed on supplement coverage with NovoLog. - We will check her hemoglobin A1c.

## 2022-09-10 NOTE — Discharge Summary (Signed)
AMA NOTE    Patient ID: Erika Rhodes MRN: [id number] DOB/AGE: 1981-01-15 41 y.o.  Admit date: 09/08/2022 Discharge date: 09/10/2022   PLEASE NOTE THAT PATIENT LEFT AGAINST MEDICAL ADVICE. Risks of sepsis, worsening cellulitis, death were explained in detail to the patient and she verbally understood the risks of leaving AMA.   Primary Care Physician:  Patient, No Pcp Per  Discharge Diagnoses:     Left facial cellulitis  Asthma  Rheumatoid arthritis ([place])  History of depression with anxiety Hypokalemia Diabetes mellitus GERD  Consults:   Psychiatry I had consulted ENT, Dr. Redmond Baseman, however patient was still awaiting evaluation before she left AMA   Recommendations for Outpatient Follow-up:     Allergies:   Allergies  Allergen Reactions   Z-Pak [Azithromycin] Anaphylaxis     Discharge Medications: Please note that patient left AMA (against medical advice)   Brief H and P: For complete details please refer to admission H and P, but in brief Patient is a 41 year old female with history of anxiety, depression, asthma, migraine, RA, DM type II presented to ED with acute onset of worsening of left facial swelling.  Patient reported that she had upper jaw tooth abscess weeks ago for which she was given Augmentin and almost clear abdomen.  She later became sick with URI symptoms and sinus infection with nasal congestion.  She started having worsening left cheek swelling with erythema as well as upper and lower jaw pain with associated purulent skin drainage.  Swelling extended up to her eye and down to her neck.  She had a televisit with doctors on demand and was prescribed p.o. doxycycline which she took over the last couple of days.  She reported worsening of the symptoms, experienced a tactile fever and chills with diaphoresis, nausea and vomiting. CBC showed leukocytosis of 14.2 with neutrophilia. CT imaging showed diffuse soft tissue swelling with inflammatory stranding  involving the left face with inferior extension along the left anterior neck as above, concerning for acute infection/cellulitis.  Ill-defined soft tissue stranding/phlegmonous change within these regions but no organized collection or abscess identified Vital signs on admission showed temperature 98.4, respiratory rate 18, tachycardia with heart rate of 100, BP 116/91   Patient was admitted for further work-up.    Hospital Course:   Sepsis with Facial cellulitis ([place]) -Patient had leukocytosis, tachycardia, borderline BP, source likely due to facial cellulitis -CT imaging did not show organized fluid collection or abscess however does have induration on the left cheek and under chin -Patient was placed on IV Unasyn -[place] [facility] practice was consulted for further recommendations, however patient left AGAINST MEDICAL ADVICE prior to seen by ENT -Sepsis physiology resolved.  DC IV fluids   History of depression with anxiety -Currently, having significant anxiety, mistrust of healthcare from past experience, does not want OR (patient was explained clearly that it will depend on ENT recommendations, may not need any drainage as there is no abscess on CT), then wants to leave AMA and "nature takes its course". -I discussed with the patient regarding continue antibiotics and await ENT recommendations before deciding on AMA as she may not need any drainage -Psychiatry was consulted, patient has a prior history of healthcare trauma complicating care.  She has significant anxiety however no suicidal ideation, was cleared by psychiatry, alert and oriented, left AMA     Rheumatoid arthritis ([place]) -Outpatient follow-up with her rheumatologist     Asthma -Currently stable, no wheezing     GERD without esophagitis -Continue PPI  Uncontrolled type 2 diabetes mellitus with hyperglycemia, without long-term current use of insulin (HCC) -Hemoglobin A1c 8.3 -Continue sliding scale insulin while  inpatient   Hypokalemia -replaced     Obesity Estimated body mass index is 30.83 kg/m as calculated from the following:   Height as of this encounter: 5' 3.5" (1.613 m).   Weight as of this encounter: 80.2 kg.    Patient left AMA   The results of significant diagnostics from this hospitalization (including imaging, microbiology, ancillary and laboratory) are listed below for reference.    LAB RESULTS: Basic Metabolic Panel: Recent Labs  Lab 09/08/22 1829 09/09/22 0512  NA 135 138  K 4.0 3.4*  CL 102 105  CO2 25 25  GLUCOSE 214* 140*  BUN 7 9  CREATININE 0.72 0.81  CALCIUM 8.6* 8.5*   Liver Function Tests: No results for input(s): "AST", "ALT", "ALKPHOS", "BILITOT", "PROT", "ALBUMIN" in the last 168 hours. No results for input(s): "LIPASE", "AMYLASE" in the last 168 hours. No results for input(s): "AMMONIA" in the last 168 hours. CBC: Recent Labs  Lab 09/08/22 1828 09/09/22 0512  WBC 14.2* 11.3*  NEUTROABS 10.8*  --   HGB 14.5 13.7  HCT 40.9 39.0  MCV 87.4 88.8  PLT 324 283   Cardiac Enzymes: No results for input(s): "CKTOTAL", "CKMB", "CKMBINDEX", "TROPONINI" in the last 168 hours. BNP: Invalid input(s): "POCBNP" CBG: Recent Labs  Lab 09/09/22 0818  GLUCAP 122*    Significant Diagnostic Studies:  CT Soft Tissue Neck W Contrast  Result Date: 09/08/2022 CLINICAL DATA:  Initial evaluation for soft tissue swelling, infection suspected. EXAM: CT NECK WITH CONTRAST TECHNIQUE: Multidetector CT imaging of the neck was performed using the standard protocol following the bolus administration of intravenous contrast. RADIATION DOSE REDUCTION: This exam was performed according to the departmental dose-optimization program which includes automated exposure control, adjustment of the mA and/or kV according to patient size and/or use of iterative reconstruction technique. CONTRAST:  63mL OMNIPAQUE IOHEXOL 300 MG/ML  SOLN COMPARISON:  Prior study from 10/18/2021.  FINDINGS: Pharynx and larynx: Oral cavity within normal limits. Oropharynx and nasopharynx unremarkable. No retropharyngeal collection or swelling. Negative epiglottis. Hypopharynx and supraglottic larynx within normal limits. Glottis symmetric and normal. Subglottic airway patent clear. Salivary glands: Diffuse soft tissue swelling seen involving the soft tissues of the left face. Changes involve the left infraorbital region, pre maxillary region, as well as the left parotid, masticator, and submandibular spaces. Inferior extension along the anterior neck towards the midline. Overlying skin thickening noted. Findings concerning for acute infection/cellulitis. Ill-defined soft tissue stranding/phlegmonous change seen within these regions, but with no discrete abscess or drainable fluid collection. Few prominent intraparotid lymph nodes at the left parotid gland noted, presumably reactive. Remainder of the salivary glands including the right parotid gland and submandibular glands are within normal limits. Thyroid: Normal. Lymph nodes: Prominent upper cervical lymph nodes, largest of which measures 1.2 cm at level 2, presumably reactive. Vascular: Normal intravascular enhancement seen throughout the neck. Limited intracranial: Unremarkable. Visualized orbits: Unremarkable. No evidence for intraorbital or postseptal cellulitis. Mastoids and visualized paranasal sinuses: Paranasal sinuses are clear. Mastoid air cells and middle ear cavities are well pneumatized and free of fluid. Skeleton: No discrete or worrisome osseous lesions. Upper chest: Visualized upper chest demonstrates no acute finding. Other: None. IMPRESSION: 1. Diffuse soft tissue swelling with inflammatory stranding involving the left face with inferior extension along the left anterior neck as above, concerning for acute infection/cellulitis. Ill-defined soft tissue stranding/phlegmonous change within  these regions, but with no organized collection or  discrete abscess identified. 2. Prominent upper cervical lymph nodes, presumably reactive. Electronically Signed   By: Rise Mu M.D.   On: 09/08/2022 19:56    2D ECHO:   Disposition and Follow-up:    DISPOSITION: Patient left AMA. She was advised to seek follow-up with primary care physician back to ER if symptoms worsen.     Signed:   Thad Ranger M.D. [facility] 09/10/2022, 6:18 AM

## 2022-09-22 IMAGING — CT CT MAXILLOFACIAL W/ CM
3 series · 15 of 47 positions shown, 18 images · IV contrast (APPLIED)
Comparison: 10/06/2018 maxillofacial radiographs, CT head
09/24/2020

CLINICAL DATA: Right back to pain, rule out dental abscess

EXAM:
CT MAXILLOFACIAL WITH CONTRAST
TECHNIQUE: Multidetector CT imaging of the maxillofacial structures was
performed with intravenous contrast. Multiplanar CT image
reconstructions were also generated.

[Series 2: 1 max soft · axial · 0.37mm/px · z∈[+710,+856]mm · 9 of 85 slices shown, 12 images]
[im 6/85  brain]
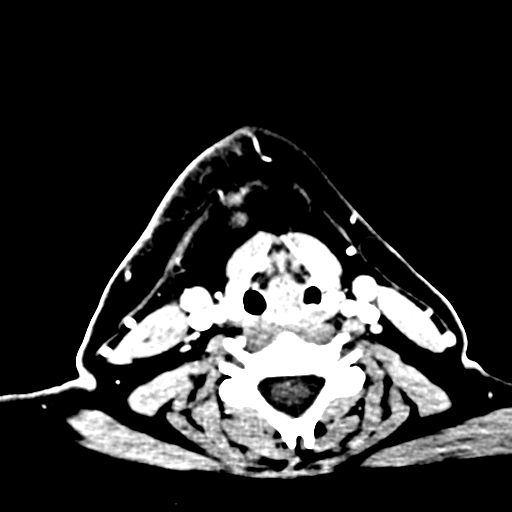
[im 6/85  bone]
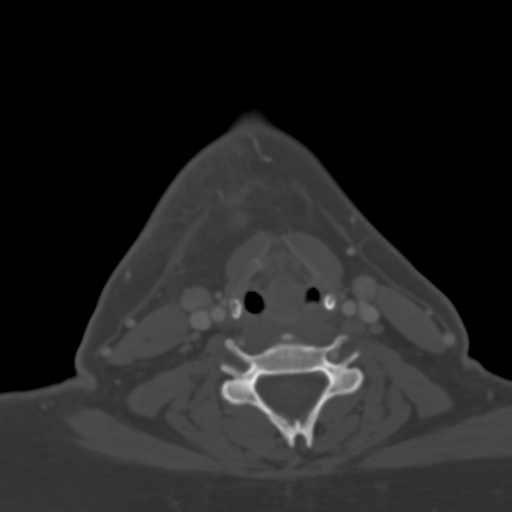
[im 15/85  bone]
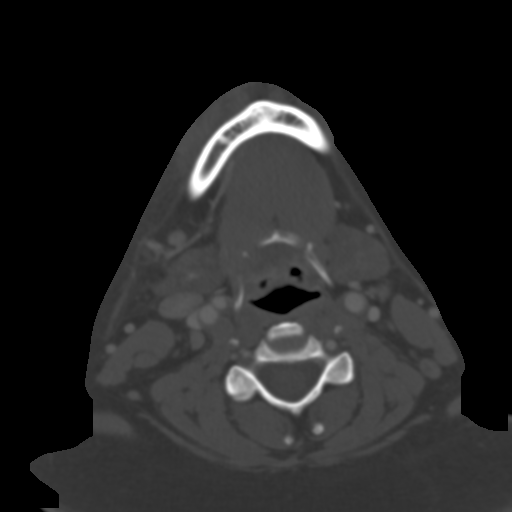
[im 24/85  bone]
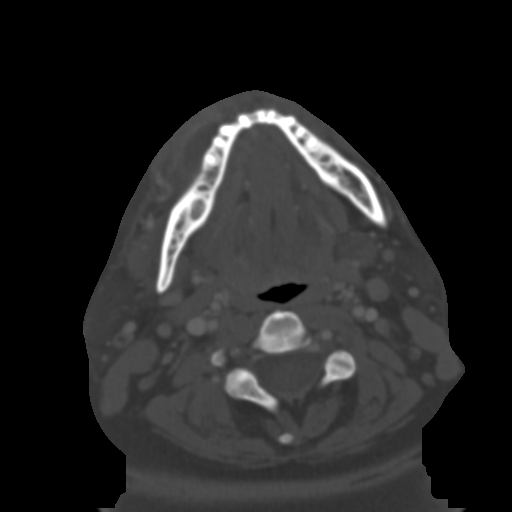
[im 32/85  bone]
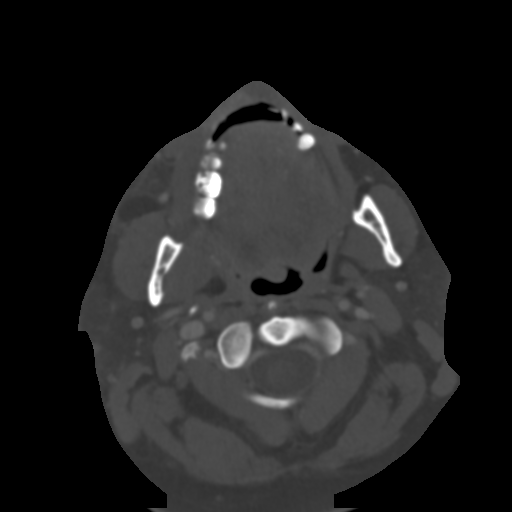
[im 44/85  brain]
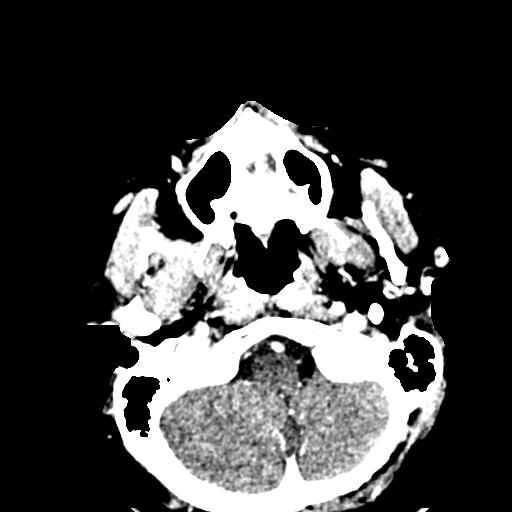
[im 44/85  bone]
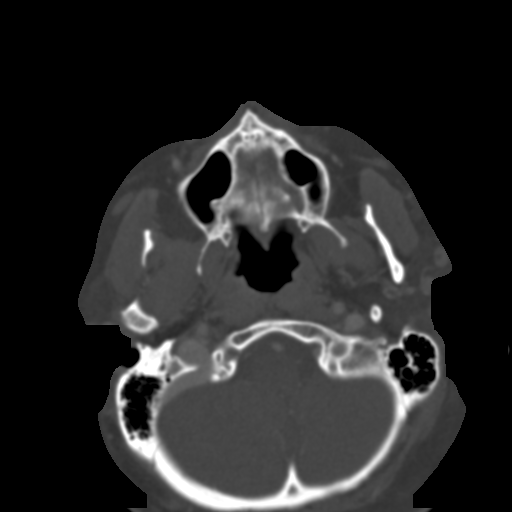
[im 53/85  bone]
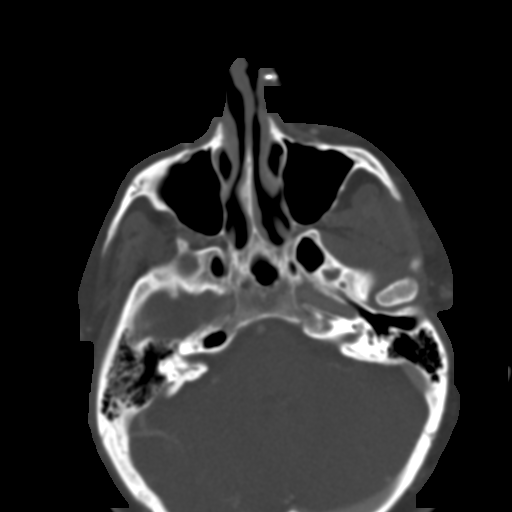
[im 61/85  bone]
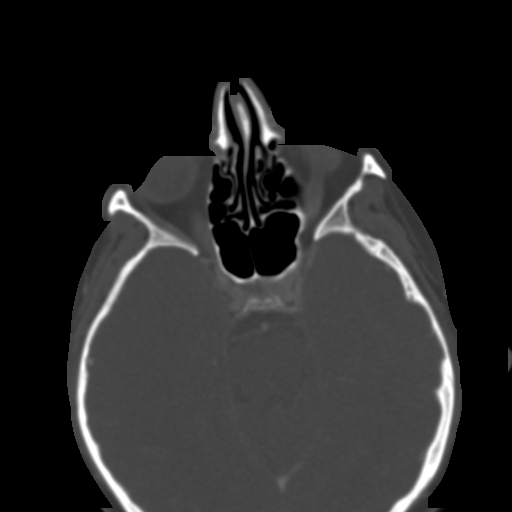
[im 70/85  bone]
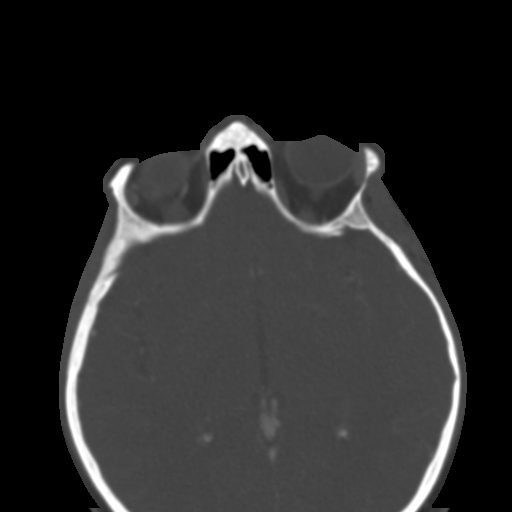
[im 79/85  brain]
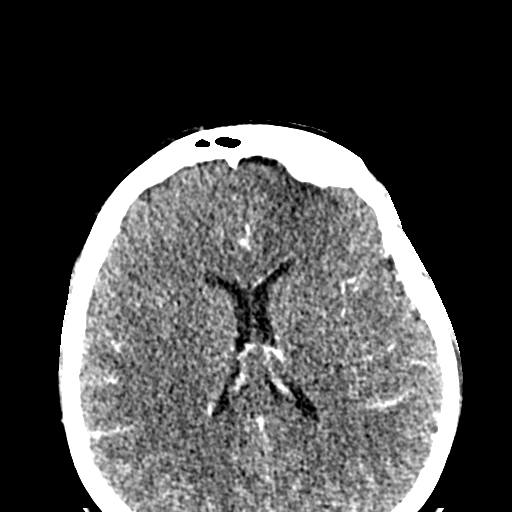
[im 79/85  bone]
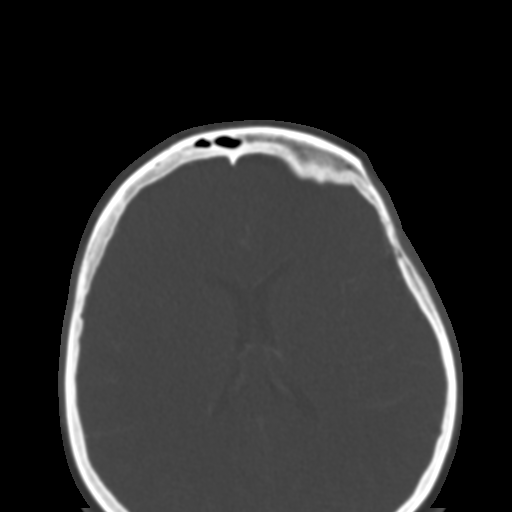

[Series 6: coronal soft · coronal · 0.33mm/px · 3 of 71 slices shown]
[im 24/71  bone]
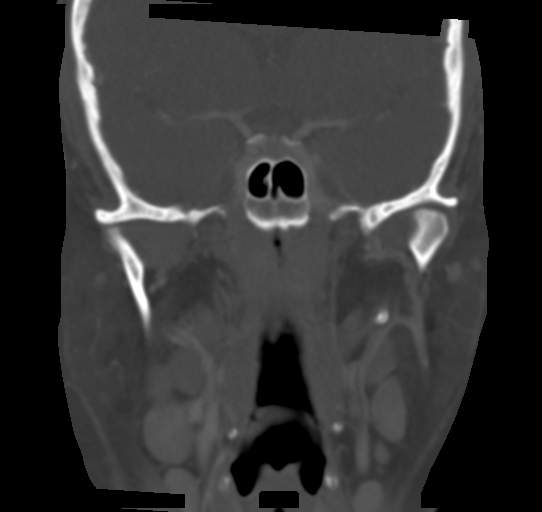
[im 32/71  bone]
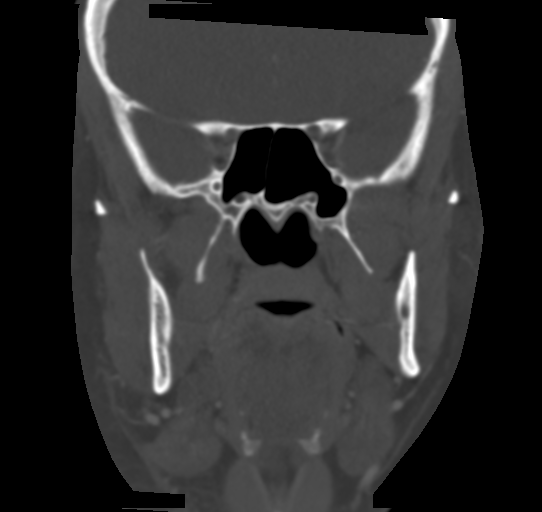
[im 39/71  bone]
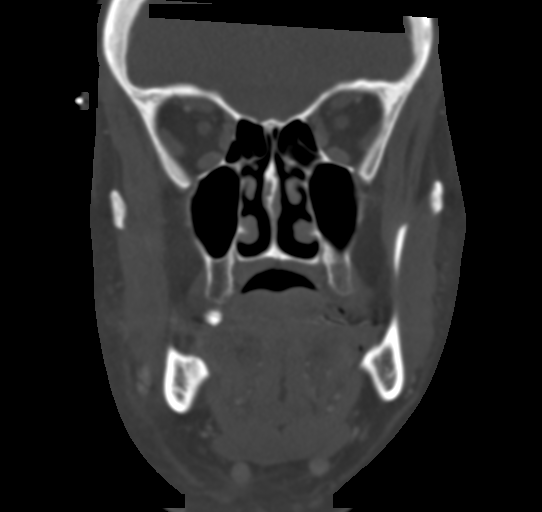

[Series 7: sagittal soft · sagittal · 0.33mm/px · 3 of 90 slices shown]
[im 30/90  bone]
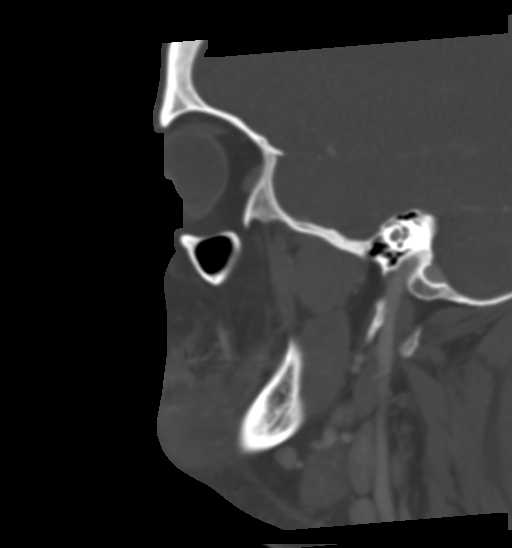
[im 45/90  bone]
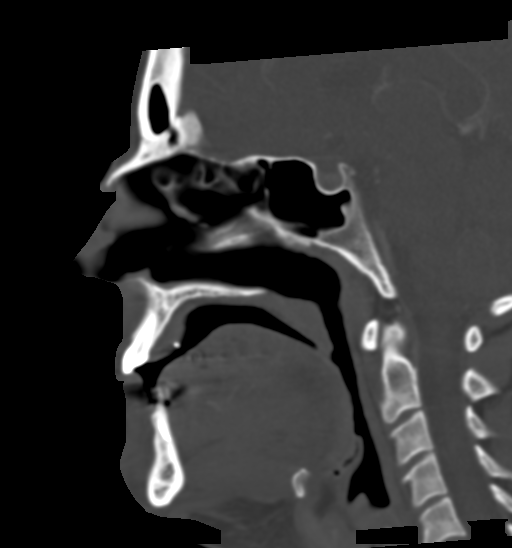
[im 60/90  bone]
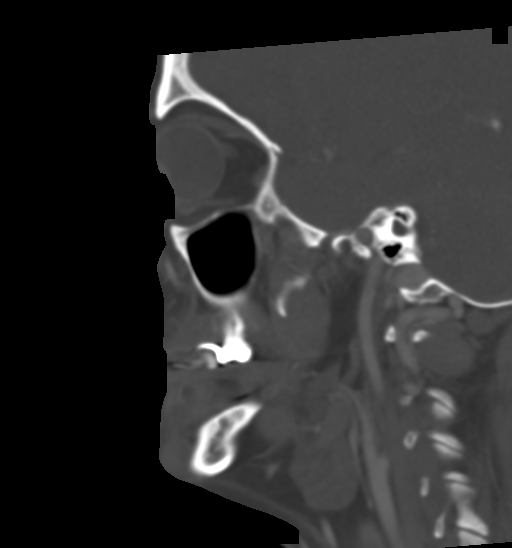

[15 of 47 positions shown; findings below may reference images not displayed]

RADIATION DOSE REDUCTION: This exam was performed according to the
departmental dose-optimization program which includes automated
exposure control, adjustment of the mA and/or kV according to
patient size and/or use of iterative reconstruction technique.

CONTRAST:  75mL OMNIPAQUE IOHEXOL 300 MG/ML  SOLN
FINDINGS: Osseous: No acute fracture or mandibular dislocation. Poor
dentition, with dental caries; periapical lucency about a right
mandibular premolar (series 4, image 22), as well as less
significant periapical lucency about the left mandibular canine. No
definite osseous erosion or cortical breakthrough adjacent to the
right mandibular premolar, however there is a subperiosteal abscess
in this location, which measures up to 2.3 x 0.8 x 1.9 cm (AP x TR x
CC) (series 2, image 23 and series 6, image 52), with significant
surrounding enhancement and fat stranding.

Orbits: Negative. No traumatic or inflammatory finding.

Sinuses: Clear.

Soft tissues: Subperiosteal abscess along the right mandible, which
measures up to 2.3 x 0.8 x 1 point cm (series 2, image 23 and series
6, image 52), which demonstrates significant surrounding enhancement
and fat stranding. The soft tissues are otherwise unremarkable.

Limited intracranial: No significant or unexpected finding.
IMPRESSION: Subperiosteal abscess along the right mandible, adjacent to a right
mandibular premolar with significant periapical lucency, most likely
an odontogenic abscess.

## 2022-10-03 ENCOUNTER — Emergency Department (HOSPITAL_BASED_OUTPATIENT_CLINIC_OR_DEPARTMENT_OTHER)
Admission: EM | Admit: 2022-10-03 | Discharge: 2022-10-03 | Disposition: A | Discharge status: Home / Self Care | Payer: Medicaid Other | Attending: Emergency Medicine | Admitting: Emergency Medicine

## 2022-10-03 ENCOUNTER — Encounter (HOSPITAL_BASED_OUTPATIENT_CLINIC_OR_DEPARTMENT_OTHER): Payer: Self-pay | Admitting: Emergency Medicine

## 2022-10-03 ENCOUNTER — Other Ambulatory Visit: Payer: Self-pay

## 2022-10-03 DIAGNOSIS — M2550 Pain in unspecified joint: Secondary | ICD-10-CM | POA: Insufficient documentation

## 2022-10-03 DIAGNOSIS — M2540 Effusion, unspecified joint: Secondary | ICD-10-CM | POA: Insufficient documentation

## 2022-10-03 MED ORDER — PREDNISONE 20 MG PO TABS: ORAL_TABLET | ORAL | 0 refills | Status: DC

## 2022-10-03 MED ORDER — METHYLPREDNISOLONE SODIUM SUCC 125 MG IJ SOLR
125.0000 mg | Freq: Once | INTRAMUSCULAR | Status: AC
  Administered 2022-10-03: 125 mg via INTRAMUSCULAR
  Filled 2022-10-03: qty 2

## 2022-10-03 NOTE — ED Provider Notes (Signed)
[facility] EMERGENCY DEPARTMENT Provider Note   CSN: [id number] Arrival date & time: 10/03/22  0803     History  Chief Complaint  Patient presents with   Joint Pain    chronic    Erika Rhodes is a 41 y.o. female.  Patient presents to the emergency department today requesting prednisone for rheumatoid arthritis flare.  She states that her current flare involves her hands and other joints.  She used to get a lot of swelling, however does not have a lot of swelling at the current time.  She states that the symptoms started about a week ago and have worsened.  Unrelieved with home medications.  Typically she requires a "high-dose" of prednisone for her flares.  Currently she is working to find a primary care doctor no longer has a routine rheumatologist.       Home Medications Prior to Admission medications   Medication Sig Start Date End Date Taking? Authorizing Provider  predniSONE (DELTASONE) 20 MG tablet 3 Tabs PO Days 1-3, then 2 tabs PO Days 4-6, then 1 tab PO Day 7-9, then Half Tab PO Day 10-12 10/03/22  Yes Carlisle Cater, PA-C  albuterol (PROVENTIL) (2.5 MG/3ML) 0.083% nebulizer solution Take 3 mLs (2.5 mg total) by nebulization every 6 (six) hours as needed for wheezing or shortness of breath. 07/26/22   Tonye Pearson, PA-C  albuterol (VENTOLIN HFA) 108 (90 Base) MCG/ACT inhaler Inhale 1-2 puffs into the lungs every 4 (four) hours as needed for wheezing or shortness of breath. 01/15/21   Ethelene Hal, NP  Aspirin-Acetaminophen-Caffeine (GOODY HEADACHE PO) Take 1 packet by mouth 2 (two) times daily as needed (headache,pain).    [provider]  clindamycin (CLEOCIN T) 1 % lotion Apply 1 application  topically daily. To affected area on face    [provider]  diazepam (VALIUM) 5 MG tablet Take 1 tablet (5 mg total) by mouth every 6 (six) hours as needed for anxiety. Patient taking differently: Take 5 mg by mouth 2 (two) times daily.  08/01/22   Molpus, John, MD  doxycycline (ADOXA) 100 MG tablet Take 100 mg by mouth 2 (two) times daily. Continuous course - acne    [provider]  ELDERBERRY PO Take 15-30 mLs by mouth daily.    [provider]  Fexofenadine HCl (ALLEGRA PO) Take 1 tablet by mouth daily.    [provider]  omeprazole (PRILOSEC OTC) 20 MG tablet Take 20 mg by mouth daily.    [provider]  OVER THE COUNTER MEDICATION Take 1 Dose by mouth See admin instructions. Mushroom Complex liquid. About 1/2 dropperful once daily.    [provider]  OVER THE COUNTER MEDICATION Take 1 Dose by mouth See admin instructions. Chlorophyll liquid supplement. About 1/2 dropperful once daily.    [provider]  OVER THE COUNTER MEDICATION Take 1 Dose by mouth See admin instructions. Seamoss juice. 2 swallows daily.    [provider]      Allergies    Z-pak [azithromycin]    Review of Systems   Review of Systems  Physical Exam Updated Vital Signs BP 119/79   Pulse 69   Temp 97.8 F (36.6 C) (Oral)   Resp 17   Ht 5\' 3"  (1.6 m)   Wt 79.4 kg   SpO2 100%   BMI 31.00 kg/m   Physical Exam Vitals and nursing note reviewed.  Constitutional:      General: She is not in  acute distress.    Appearance: She is well-developed.  HENT:     Head: Normocephalic and atraumatic.     Right Ear: External ear normal.     Left Ear: External ear normal.     Nose: Nose normal.  Eyes:     Conjunctiva/sclera: Conjunctivae normal.  Cardiovascular:     Rate and Rhythm: Normal rate and regular rhythm.     Heart sounds: No murmur heard. Pulmonary:     Effort: No respiratory distress.     Breath sounds: No wheezing, rhonchi or rales.  Abdominal:     Palpations: Abdomen is soft.     Tenderness: There is no abdominal tenderness. There is no guarding or rebound.  Musculoskeletal:     Cervical back: Normal range of motion and neck supple.     Right lower leg: No edema.      Left lower leg: No edema.     Comments: Patient can flex and extend the joints of her fingers, hands, wrists.  No joint swelling noted.  No overlying erythema.  Also is able to flex and extend/rotate at the elbows and shoulders.  Skin:    General: Skin is warm and dry.     Findings: No rash.  Neurological:     General: No focal deficit present.     Mental Status: She is alert. Mental status is at baseline.     Motor: No weakness.  Psychiatric:        Mood and Affect: Mood normal.     ED Results / Procedures / Treatments   Labs (all labs ordered are listed, but only abnormal results are displayed) Labs Reviewed - No data to display  EKG None  Radiology No results found.  Procedures Procedures    Medications Ordered in ED Medications  methylPREDNISolone sodium succinate (SOLU-MEDROL) 125 mg/2 mL injection 125 mg (has no administration in time range)    ED Course/ Medical Decision Making/ A&P    Patient seen and examined. History obtained directly from patient.   Labs/EKG: None ordered  Imaging: None ordered  Medications/Fluids: Ordered: IM Solu-Medrol  Most recent vital signs reviewed and are as follows: BP 119/79   Pulse 69   Temp 97.8 F (36.6 C) (Oral)   Resp 17   Ht 5\' 3"  (1.6 m)   Wt 79.4 kg   SpO2 100%   BMI 31.00 kg/m   Initial impression: Rheumatoid arthritis flare  Home treatment plan: OTC meds  Return instructions discussed with patient: Worsening pain, swelling, other concerns  Follow-up instructions discussed with patient: Encouraged PCP/rheumatology follow-up in 1 week.                          Medical Decision Making Risk Prescription drug management.   Patient with joint pain, minimal swelling.  No signs of septic arthritis today.  No systemic signs of infection.  Per patient history, pain is currently consistent with rheumatoid arthritis flare, typically treated and improved with prednisone.  She will be given a tapered  course.        Final Clinical Impression(s) / ED Diagnoses Final diagnoses:  Arthralgia, unspecified joint    Rx / DC Orders ED Discharge Orders          Ordered    predniSONE (DELTASONE) 20 MG tablet        10/03/22 0919              10/05/22, PA-C 10/03/22 [id number]  Fransico Meadow, MD 10/10/22 1007

## 2022-10-03 NOTE — ED Triage Notes (Signed)
Recurrent body ache , every joint she reports , Hx rheumatoid arthritis , feel like a flare up , needs prednisone she said

## 2022-10-03 NOTE — Discharge Instructions (Signed)
Please read and follow all provided instructions.  Your diagnoses today include:  1. Arthralgia, unspecified joint    Tests performed today include: Vital signs. See below for your results today.   Medications prescribed:  Prednisone - steroid medicine   It is best to take this medication in the morning to prevent sleeping problems. If you are diabetic, monitor your blood sugar closely and stop taking Prednisone if blood sugar is over 300. Take with food to prevent stomach upset.   Take any prescribed medications only as directed.  Home care instructions:  Follow any educational materials contained in this packet.  Follow-up instructions: Please follow-up with your primary care provider in the next 7 days for further evaluation of your symptoms.   Return instructions:  Please return to the Emergency Department if you experience worsening symptoms.  Please return if you have any other emergent concerns.  Additional Information:  Your vital signs today were: BP 119/79   Pulse 69   Temp 97.8 F (36.6 C) (Oral)   Resp 17   Ht 5\' 3"  (1.6 m)   Wt 79.4 kg   SpO2 100%   BMI 31.00 kg/m  If your blood pressure (BP) was elevated above 135/85 this visit, please have this repeated by your doctor within one month. --------------

## 2022-11-11 ENCOUNTER — Ambulatory Visit: Payer: Medicaid Other | Admitting: Internal Medicine

## 2022-12-03 ENCOUNTER — Encounter (HOSPITAL_BASED_OUTPATIENT_CLINIC_OR_DEPARTMENT_OTHER): Payer: Self-pay | Admitting: Emergency Medicine

## 2022-12-03 ENCOUNTER — Other Ambulatory Visit: Payer: Self-pay

## 2022-12-03 ENCOUNTER — Emergency Department (HOSPITAL_BASED_OUTPATIENT_CLINIC_OR_DEPARTMENT_OTHER)
Admission: EM | Admit: 2022-12-03 | Discharge: 2022-12-03 | Disposition: A | Discharge status: Home / Self Care | Payer: Medicaid Other | Attending: Emergency Medicine | Admitting: Emergency Medicine

## 2022-12-03 DIAGNOSIS — F419 Anxiety disorder, unspecified: Secondary | ICD-10-CM | POA: Insufficient documentation

## 2022-12-03 DIAGNOSIS — M791 Myalgia, unspecified site: Secondary | ICD-10-CM | POA: Insufficient documentation

## 2022-12-03 DIAGNOSIS — R519 Headache, unspecified: Secondary | ICD-10-CM | POA: Insufficient documentation

## 2022-12-03 DIAGNOSIS — M2550 Pain in unspecified joint: Secondary | ICD-10-CM | POA: Diagnosis not present

## 2022-12-03 DIAGNOSIS — R000 Tachycardia, unspecified: Secondary | ICD-10-CM | POA: Diagnosis not present

## 2022-12-03 DIAGNOSIS — Z7982 Long term (current) use of aspirin: Secondary | ICD-10-CM | POA: Insufficient documentation

## 2022-12-03 MED ORDER — METOPROLOL TARTRATE 25 MG PO TABS: 12.5000 mg | ORAL_TABLET | Freq: Two times a day (BID) | ORAL | 0 refills | Status: DC

## 2022-12-03 MED ORDER — DEXAMETHASONE SODIUM PHOSPHATE 10 MG/ML IJ SOLN
10.0000 mg | Freq: Once | INTRAMUSCULAR | Status: AC
  Administered 2022-12-03: 10 mg via INTRAMUSCULAR
  Filled 2022-12-03: qty 1

## 2022-12-03 MED ORDER — PREDNISONE 10 MG PO TABS: ORAL_TABLET | ORAL | 0 refills | Status: DC

## 2022-12-03 MED ORDER — METOPROLOL TARTRATE 25 MG PO TABS
25.0000 mg | ORAL_TABLET | Freq: Once | ORAL | Status: AC
  Administered 2022-12-03: 25 mg via ORAL
  Filled 2022-12-03: qty 1

## 2022-12-03 MED ORDER — KETOROLAC TROMETHAMINE 60 MG/2ML IM SOLN
60.0000 mg | Freq: Once | INTRAMUSCULAR | Status: AC
  Administered 2022-12-03: 60 mg via INTRAMUSCULAR
  Filled 2022-12-03: qty 2

## 2022-12-03 NOTE — ED Provider Notes (Signed)
[facility] Provider Note   CSN: [id number] Arrival date & time: 12/03/22  1157     History  Chief Complaint  Patient presents with   Joint Pain    Erika Rhodes is a 42 y.o. female with a longstanding history of rheumatoid arthritis, who was formerly following at an outside clinic when living in [place], no longer has rheumatologist, presented to ED complaining of diffuse joint pain, tachycardia, anxiety.  Patient reports she has been dealing with this for a long time and feels that this is an RA flareup.  She says typically steroids are the only thing that help her through these flareups.  She generally needs a longer course of up to 10 days of steroids.  She is also requesting for Toradol for her headache which she thinks is related to her RA, and also requesting to be restarted on metoprolol that she has been on before for tachycardia symptoms.  No other complaints  HPI     Home Medications Prior to Admission medications   Medication Sig Start Date End Date Taking? Authorizing Provider  metoprolol tartrate (LOPRESSOR) 25 MG tablet Take 0.5 tablets (12.5 mg total) by mouth 2 (two) times daily for 30 doses. 12/03/22 12/18/22 Yes Christine Morton, Carola Rhine, MD  predniSONE (DELTASONE) 10 MG tablet Take 6 tablets (60 mg total) by mouth daily with breakfast for 6 days, THEN 4 tablets (40 mg total) daily with breakfast for 3 days, THEN 2 tablets (20 mg total) daily with breakfast for 3 days. 12/04/22 12/16/22 Yes Brixton Schnapp, Carola Rhine, MD  albuterol (PROVENTIL) (2.5 MG/3ML) 0.083% nebulizer solution Take 3 mLs (2.5 mg total) by nebulization every 6 (six) hours as needed for wheezing or shortness of breath. 07/26/22   Tonye Pearson, PA-C  albuterol (VENTOLIN HFA) 108 (90 Base) MCG/ACT inhaler Inhale 1-2 puffs into the lungs every 4 (four) hours as needed for wheezing or shortness of breath. 01/15/21   Ethelene Hal, NP  Aspirin-Acetaminophen-Caffeine (GOODY  HEADACHE PO) Take 1 packet by mouth 2 (two) times daily as needed (headache,pain).    [provider]  clindamycin (CLEOCIN T) 1 % lotion Apply 1 application  topically daily. To affected area on face    [provider]  diazepam (VALIUM) 5 MG tablet Take 1 tablet (5 mg total) by mouth every 6 (six) hours as needed for anxiety. Patient taking differently: Take 5 mg by mouth 2 (two) times daily. 08/01/22   Molpus, John, MD  doxycycline (ADOXA) 100 MG tablet Take 100 mg by mouth 2 (two) times daily. Continuous course - acne    [provider]  ELDERBERRY PO Take 15-30 mLs by mouth daily.    [provider]  Fexofenadine HCl (ALLEGRA PO) Take 1 tablet by mouth daily.    [provider]  omeprazole (PRILOSEC OTC) 20 MG tablet Take 20 mg by mouth daily.    [provider]  OVER THE COUNTER MEDICATION Take 1 Dose by mouth See admin instructions. Mushroom Complex liquid. About 1/2 dropperful once daily.    [provider]  OVER THE COUNTER MEDICATION Take 1 Dose by mouth See admin instructions. Chlorophyll liquid supplement. About 1/2 dropperful once daily.    [provider]  OVER THE COUNTER MEDICATION Take 1 Dose by mouth See admin instructions. Seamoss juice. 2 swallows daily.    [provider]  predniSONE (DELTASONE) 20 MG tablet 3 Tabs PO Days 1-3, then 2 tabs PO Days 4-6,  then 1 tab PO Day 7-9, then Half Tab PO Day 10-12 10/03/22   Carlisle Cater, PA-C      Allergies    Z-pak [azithromycin]    Review of Systems   Review of Systems  Physical Exam Updated Vital Signs BP (!) 152/103   Pulse (!) 111   Temp 98 F (36.7 C) (Oral)   Resp 17   Ht 5\' 3"  (1.6 m)   Wt 80.6 kg   SpO2 98%   BMI 31.48 kg/m  Physical Exam Constitutional:      General: She is not in acute distress. HENT:     Head: Normocephalic and atraumatic.  Eyes:     Conjunctiva/sclera: Conjunctivae normal.     Pupils: Pupils are equal, round,  and reactive to light.  Cardiovascular:     Rate and Rhythm: Regular rhythm. Tachycardia present.  Pulmonary:     Effort: Pulmonary effort is normal. No respiratory distress.  Skin:    General: Skin is warm and dry.  Neurological:     General: No focal deficit present.     Mental Status: She is alert. Mental status is at baseline.  Psychiatric:        Mood and Affect: Mood normal.        Behavior: Behavior normal.     ED Results / Procedures / Treatments   Labs (all labs ordered are listed, but only abnormal results are displayed) Labs Reviewed - No data to display  EKG None  Radiology No results found.  Procedures Procedures    Medications Ordered in ED Medications  dexamethasone (DECADRON) injection 10 mg (has no administration in time range)  ketorolac (TORADOL) injection 60 mg (has no administration in time range)  metoprolol tartrate (LOPRESSOR) tablet 25 mg (25 mg Oral Given 12/03/22 1309)    ED Course/ Medical Decision Making/ A&P                             Medical Decision Making Risk Prescription drug management.   This patient presents to the ED with concern for myalgias, arthralgias, headache, tachycardia. This involves an extensive number of treatment options, and is a complaint that carries with it a high risk of complications and morbidity.  The differential diagnosis includes RA flareup.  Headache is likely related to RA or else tension type.  Tachycardia may be anxiety related, or due to pain.  It is reasonable to treat with steroid medications, restart metoprolol, and give a shot of IM Toradol for her headache.  Denies any indication for blood work at this time.  Have a low suspicion for acute infection, sepsis, pulmonary embolism.  Low suspicion for meningitis or other life-threatening causes of headache.  No indication for LP at this time.  Patient was given medications in the ED and discharged with prescription for these medications, short-term  metoprolol, and a steroid taper.  We did discuss the risks of continued steroid use.  She has been on several long courses in her lifetime for variety of medical conditions.  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  Dispostion:  After consideration of the diagnostic results and the patients response to treatment, I feel that the patent would benefit from outpatient follow-up         Final Clinical Impression(s) / ED Diagnoses  Final diagnoses:  Arthralgia, unspecified joint  Myalgia  Tachycardia    Rx / DC Orders ED Discharge Orders          Ordered    predniSONE (DELTASONE) 10 MG tablet  Q breakfast        12/03/22 1309    metoprolol tartrate (LOPRESSOR) 25 MG tablet  2 times daily        12/03/22 1309              Terald Sleeper, MD 12/03/22 1312

## 2022-12-03 NOTE — Discharge Instructions (Addendum)
It is very important you follow-up with your primary care clinic, or also rheumatologist.  Frequent courses of steroids can lead to long-term medical problems, including weakened bones, high blood sugars, stroke, and heart disease.  These types of medication should be managed by primary care clinic or specialist moving forward.  Steroid medications can also lead to mood disorders, mental health problems.

## 2022-12-03 NOTE — ED Triage Notes (Addendum)
Pt is having a flare up with her RA.  Pt is also having some headaches recently.  Pt also has some tachycardia and is requesting lopressor, toradol and prednisone.

## 2022-12-03 NOTE — ED Notes (Signed)
Urine specimen in lab 

## 2022-12-09 ENCOUNTER — Emergency Department (EMERGENCY_DEPARTMENT_HOSPITAL)
Admission: EM | Admit: 2022-12-09 | Discharge: 2022-12-10 | Disposition: A | Discharge status: Other Acute Inpatient Hospital | Payer: Medicaid Other | Source: Home / Self Care | Attending: Emergency Medicine | Admitting: Emergency Medicine

## 2022-12-09 DIAGNOSIS — R739 Hyperglycemia, unspecified: Secondary | ICD-10-CM | POA: Insufficient documentation

## 2022-12-09 DIAGNOSIS — F333 Major depressive disorder, recurrent, severe with psychotic symptoms: Secondary | ICD-10-CM | POA: Diagnosis present

## 2022-12-09 DIAGNOSIS — R45851 Suicidal ideations: Secondary | ICD-10-CM | POA: Insufficient documentation

## 2022-12-09 DIAGNOSIS — J45909 Unspecified asthma, uncomplicated: Secondary | ICD-10-CM | POA: Insufficient documentation

## 2022-12-09 DIAGNOSIS — F29 Unspecified psychosis not due to a substance or known physiological condition: Secondary | ICD-10-CM

## 2022-12-09 DIAGNOSIS — F1220 Cannabis dependence, uncomplicated: Secondary | ICD-10-CM | POA: Diagnosis present

## 2022-12-09 DIAGNOSIS — Z20822 Contact with and (suspected) exposure to covid-19: Secondary | ICD-10-CM | POA: Insufficient documentation

## 2022-12-09 DIAGNOSIS — Z7951 Long term (current) use of inhaled steroids: Secondary | ICD-10-CM | POA: Insufficient documentation

## 2022-12-09 DIAGNOSIS — F39 Unspecified mood [affective] disorder: Secondary | ICD-10-CM | POA: Diagnosis present

## 2022-12-09 LAB — COMPREHENSIVE METABOLIC PANEL
ALT: 26 U/L (ref 0–44)
AST: 31 U/L (ref 15–41)
Albumin: 3.8 g/dL (ref 3.5–5.0)
Alkaline Phosphatase: 81 U/L (ref 38–126)
Anion gap: 15 (ref 5–15)
BUN: 17 mg/dL (ref 6–20)
CO2: 21 mmol/L — ABNORMAL LOW (ref 22–32)
Calcium: 8.8 mg/dL — ABNORMAL LOW (ref 8.9–10.3)
Chloride: 91 mmol/L — ABNORMAL LOW (ref 98–111)
Creatinine, Ser: 0.81 mg/dL (ref 0.44–1.00)
GFR, Estimated: >60 mL/min (ref >60)
Glucose, Bld: 564 mg/dL (ref 70–99)
Potassium: 3.6 mmol/L (ref 3.5–5.1)
Sodium: 127 mmol/L — ABNORMAL LOW (ref 135–145)
Total Bilirubin: 0.9 mg/dL (ref 0.3–1.2)
Total Protein: 7.4 g/dL (ref 6.5–8.1)

## 2022-12-09 LAB — RAPID URINE DRUG SCREEN, HOSP PERFORMED
Amphetamines: NOT DETECTED
Barbiturates: NOT DETECTED
Benzodiazepines: POSITIVE — AB
Cocaine: NOT DETECTED
Opiates: NOT DETECTED
Tetrahydrocannabinol: POSITIVE — AB

## 2022-12-09 LAB — URINALYSIS, ROUTINE W REFLEX MICROSCOPIC
Bacteria, UA: NONE SEEN
Bilirubin Urine: NEGATIVE
Glucose, UA: >=500 mg/dL — AB
Hgb urine dipstick: NEGATIVE
Ketones, ur: NEGATIVE mg/dL
Leukocytes,Ua: NEGATIVE
Nitrite: NEGATIVE
Protein, ur: NEGATIVE mg/dL
Specific Gravity, Urine: 1.042 — ABNORMAL HIGH (ref 1.005–1.030)
pH: 6 (ref 5.0–8.0)

## 2022-12-09 LAB — CBC
HCT: 44.4 % (ref 36.0–46.0)
Hemoglobin: 16 g/dL — ABNORMAL HIGH (ref 12.0–15.0)
MCH: 31.3 pg (ref 26.0–34.0)
MCHC: 36 g/dL (ref 30.0–36.0)
MCV: 86.7 fL (ref 80.0–100.0)
Platelets: 399 10*3/uL (ref 150–400)
RBC: 5.12 MIL/uL — ABNORMAL HIGH (ref 3.87–5.11)
RDW: 11.9 % (ref 11.5–15.5)
WBC: 22.3 10*3/uL — ABNORMAL HIGH (ref 4.0–10.5)
nRBC: 0 % (ref 0.0–0.2)

## 2022-12-09 LAB — CBG MONITORING, ED: Glucose-Capillary: 506 mg/dL (ref 70–99)

## 2022-12-09 LAB — PREGNANCY, URINE: Preg Test, Ur: NEGATIVE

## 2022-12-09 LAB — RESP PANEL BY RT-PCR (RSV, FLU A&B, COVID)  RVPGX2
Influenza A by PCR: NEGATIVE
Influenza B by PCR: NEGATIVE
Resp Syncytial Virus by PCR: NEGATIVE
SARS Coronavirus 2 by RT PCR: NEGATIVE

## 2022-12-09 LAB — ACETAMINOPHEN LEVEL: Acetaminophen (Tylenol), Serum: <10 ug/mL — ABNORMAL LOW (ref 10–30)

## 2022-12-09 LAB — ETHANOL: Alcohol, Ethyl (B): <10 mg/dL (ref <10)

## 2022-12-09 MED ORDER — DIAZEPAM 5 MG PO TABS
5.0000 mg | ORAL_TABLET | Freq: Once | ORAL | Status: AC
  Administered 2022-12-09: 5 mg via ORAL
  Filled 2022-12-09: qty 1

## 2022-12-09 MED ORDER — SODIUM CHLORIDE 0.9 % IV BOLUS
1000.0000 mL | Freq: Once | INTRAVENOUS | Status: AC
  Administered 2022-12-09: 1000 mL via INTRAVENOUS

## 2022-12-09 MED ORDER — INSULIN ASPART 100 UNIT/ML IJ SOLN
8.0000 [IU] | Freq: Once | INTRAMUSCULAR | Status: AC
  Administered 2022-12-09: 8 [IU] via INTRAVENOUS
  Filled 2022-12-09: qty 0.08
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
## 2022-12-10 ENCOUNTER — Encounter (HOSPITAL_COMMUNITY): Payer: Self-pay | Admitting: Nurse Practitioner

## 2022-12-10 ENCOUNTER — Other Ambulatory Visit: Payer: Self-pay

## 2022-12-10 ENCOUNTER — Inpatient Hospital Stay (HOSPITAL_COMMUNITY)
Admission: AD | Admit: 2022-12-10 | Discharge: 2022-12-17 | DRG: 885 | Disposition: A | Discharge status: Home / Self Care | Payer: Medicaid Other | Source: Intra-hospital | Attending: Psychiatry | Admitting: Psychiatry

## 2022-12-10 DIAGNOSIS — Z7984 Long term (current) use of oral hypoglycemic drugs: Secondary | ICD-10-CM

## 2022-12-10 DIAGNOSIS — F411 Generalized anxiety disorder: Secondary | ICD-10-CM | POA: Diagnosis present

## 2022-12-10 DIAGNOSIS — F609 Personality disorder, unspecified: Secondary | ICD-10-CM | POA: Diagnosis present

## 2022-12-10 DIAGNOSIS — M069 Rheumatoid arthritis, unspecified: Secondary | ICD-10-CM | POA: Diagnosis present

## 2022-12-10 DIAGNOSIS — K5900 Constipation, unspecified: Secondary | ICD-10-CM | POA: Diagnosis present

## 2022-12-10 DIAGNOSIS — Z91119 Patient's noncompliance with dietary regimen due to unspecified reason: Secondary | ICD-10-CM

## 2022-12-10 DIAGNOSIS — E1165 Type 2 diabetes mellitus with hyperglycemia: Secondary | ICD-10-CM | POA: Diagnosis present

## 2022-12-10 DIAGNOSIS — Z79899 Other long term (current) drug therapy: Secondary | ICD-10-CM

## 2022-12-10 DIAGNOSIS — E876 Hypokalemia: Secondary | ICD-10-CM | POA: Diagnosis present

## 2022-12-10 DIAGNOSIS — F333 Major depressive disorder, recurrent, severe with psychotic symptoms: Secondary | ICD-10-CM

## 2022-12-10 DIAGNOSIS — F19959 Other psychoactive substance use, unspecified with psychoactive substance-induced psychotic disorder, unspecified: Secondary | ICD-10-CM | POA: Diagnosis not present

## 2022-12-10 DIAGNOSIS — Z794 Long term (current) use of insulin: Secondary | ICD-10-CM | POA: Diagnosis not present

## 2022-12-10 DIAGNOSIS — Z20822 Contact with and (suspected) exposure to covid-19: Secondary | ICD-10-CM | POA: Diagnosis present

## 2022-12-10 DIAGNOSIS — Z560 Unemployment, unspecified: Secondary | ICD-10-CM

## 2022-12-10 DIAGNOSIS — Z818 Family history of other mental and behavioral disorders: Secondary | ICD-10-CM

## 2022-12-10 DIAGNOSIS — F319 Bipolar disorder, unspecified: Secondary | ICD-10-CM | POA: Diagnosis present

## 2022-12-10 DIAGNOSIS — Z9151 Personal history of suicidal behavior: Secondary | ICD-10-CM | POA: Diagnosis not present

## 2022-12-10 DIAGNOSIS — F6089 Other specific personality disorders: Secondary | ICD-10-CM | POA: Diagnosis present

## 2022-12-10 DIAGNOSIS — F1220 Cannabis dependence, uncomplicated: Secondary | ICD-10-CM | POA: Diagnosis present

## 2022-12-10 DIAGNOSIS — R45851 Suicidal ideations: Secondary | ICD-10-CM | POA: Diagnosis present

## 2022-12-10 DIAGNOSIS — F332 Major depressive disorder, recurrent severe without psychotic features: Principal | ICD-10-CM | POA: Diagnosis present

## 2022-12-10 DIAGNOSIS — F4310 Post-traumatic stress disorder, unspecified: Secondary | ICD-10-CM | POA: Diagnosis present

## 2022-12-10 LAB — CBG MONITORING, ED
Glucose-Capillary: 367 mg/dL — ABNORMAL HIGH (ref 70–99)
Glucose-Capillary: 310 mg/dL — ABNORMAL HIGH (ref 70–99)
Glucose-Capillary: 337 mg/dL — ABNORMAL HIGH (ref 70–99)
Glucose-Capillary: 303 mg/dL — ABNORMAL HIGH (ref 70–99)
Glucose-Capillary: 196 mg/dL — ABNORMAL HIGH (ref 70–99)

## 2022-12-10 LAB — CBC WITH DIFFERENTIAL/PLATELET
Abs Immature Granulocytes: 0.06 10*3/uL (ref 0.00–0.07)
Basophils Absolute: 0 10*3/uL (ref 0.0–0.1)
Basophils Relative: 0 %
Eosinophils Absolute: 0.1 10*3/uL (ref 0.0–0.5)
Eosinophils Relative: 1 %
HCT: 39.5 % (ref 36.0–46.0)
Hemoglobin: 14 g/dL (ref 12.0–15.0)
Immature Granulocytes: 0 %
Lymphocytes Relative: 36 %
Lymphs Abs: 4.8 10*3/uL — ABNORMAL HIGH (ref 0.7–4.0)
MCH: 31.5 pg (ref 26.0–34.0)
MCHC: 35.4 g/dL (ref 30.0–36.0)
MCV: 88.8 fL (ref 80.0–100.0)
Monocytes Absolute: 0.7 10*3/uL (ref 0.1–1.0)
Monocytes Relative: 5 %
Neutro Abs: 7.8 10*3/uL — ABNORMAL HIGH (ref 1.7–7.7)
Neutrophils Relative %: 58 %
Platelets: 294 10*3/uL (ref 150–400)
RBC: 4.45 MIL/uL (ref 3.87–5.11)
RDW: 12 % (ref 11.5–15.5)
WBC: 13.4 10*3/uL — ABNORMAL HIGH (ref 4.0–10.5)
nRBC: 0 % (ref 0.0–0.2)

## 2022-12-10 LAB — GLUCOSE, CAPILLARY: Glucose-Capillary: 289 mg/dL — ABNORMAL HIGH (ref 70–99)

## 2022-12-10 LAB — BASIC METABOLIC PANEL
Anion gap: 8 (ref 5–15)
BUN: 20 mg/dL (ref 6–20)
CO2: 26 mmol/L (ref 22–32)
Calcium: 8.7 mg/dL — ABNORMAL LOW (ref 8.9–10.3)
Chloride: 100 mmol/L (ref 98–111)
Creatinine, Ser: 0.99 mg/dL (ref 0.44–1.00)
GFR, Estimated: >60 mL/min (ref >60)
Glucose, Bld: 207 mg/dL — ABNORMAL HIGH (ref 70–99)
Potassium: 3.2 mmol/L — ABNORMAL LOW (ref 3.5–5.1)
Sodium: 134 mmol/L — ABNORMAL LOW (ref 135–145)

## 2022-12-10 LAB — HEMOGLOBIN A1C
Hgb A1c MFr Bld: 10.7 % — ABNORMAL HIGH (ref 4.8–5.6)
Mean Plasma Glucose: 260.39 mg/dL

## 2022-12-10 MED ORDER — ACETAMINOPHEN 325 MG PO TABS
650.0000 mg | ORAL_TABLET | Freq: Four times a day (QID) | ORAL | Status: DC | PRN
  Administered 2022-12-10 – 2022-12-17 (×4): 650 mg via ORAL
  Filled 2022-12-10 (×4): qty 2

## 2022-12-10 MED ORDER — PREDNISONE 20 MG PO TABS
40.0000 mg | ORAL_TABLET | Freq: Every day | ORAL | Status: DC
  Administered 2022-12-11: 40 mg via ORAL
  Filled 2022-12-10 (×2): qty 2

## 2022-12-10 MED ORDER — ALBUTEROL SULFATE HFA 108 (90 BASE) MCG/ACT IN AERS
2.0000 | INHALATION_SPRAY | RESPIRATORY_TRACT | Status: DC | PRN
  Administered 2022-12-10: 2 via RESPIRATORY_TRACT
  Filled 2022-12-10: qty 6.7

## 2022-12-10 MED ORDER — OLANZAPINE 5 MG PO TBDP
5.0000 mg | ORAL_TABLET | Freq: Every day | ORAL | Status: DC
  Filled 2022-12-10 (×4): qty 1

## 2022-12-10 MED ORDER — DIAZEPAM 2 MG PO TABS
2.0000 mg | ORAL_TABLET | Freq: Two times a day (BID) | ORAL | Status: DC | PRN
  Administered 2022-12-10 – 2022-12-11 (×2): 2 mg via ORAL
  Filled 2022-12-10 (×2): qty 1

## 2022-12-10 MED ORDER — ALUM & MAG HYDROXIDE-SIMETH 200-200-20 MG/5ML PO SUSP: 30.0000 mL | ORAL | Status: DC | PRN

## 2022-12-10 MED ORDER — DIAZEPAM 2 MG PO TABS
2.0000 mg | ORAL_TABLET | Freq: Two times a day (BID) | ORAL | Status: DC | PRN
  Administered 2022-12-10: 2 mg via ORAL
  Filled 2022-12-10: qty 1

## 2022-12-10 MED ORDER — METFORMIN HCL 500 MG PO TABS: 500.0000 mg | ORAL_TABLET | Freq: Two times a day (BID) | ORAL | 0 refills | Status: DC

## 2022-12-10 MED ORDER — LORAZEPAM 1 MG PO TABS
1.0000 mg | ORAL_TABLET | ORAL | Status: AC
  Administered 2022-12-10: 1 mg via ORAL
  Filled 2022-12-10: qty 1

## 2022-12-10 MED ORDER — INSULIN DETEMIR 100 UNIT/ML ~~LOC~~ SOLN
10.0000 [IU] | Freq: Every day | SUBCUTANEOUS | Status: DC
  Administered 2022-12-10: 10 [IU] via SUBCUTANEOUS
  Filled 2022-12-10 (×2): qty 0.1

## 2022-12-10 MED ORDER — DULOXETINE HCL 30 MG PO CPEP
30.0000 mg | ORAL_CAPSULE | Freq: Every day | ORAL | Status: DC
  Filled 2022-12-10: qty 1

## 2022-12-10 MED ORDER — INSULIN ASPART 100 UNIT/ML IJ SOLN
0.0000 [IU] | Freq: Three times a day (TID) | INTRAMUSCULAR | Status: DC
  Administered 2022-12-11: 15 [IU] via SUBCUTANEOUS
  Administered 2022-12-11: 5 [IU] via SUBCUTANEOUS

## 2022-12-10 MED ORDER — MAGNESIUM HYDROXIDE 400 MG/5ML PO SUSP
30.0000 mL | Freq: Every day | ORAL | Status: DC | PRN
  Administered 2022-12-14: 30 mL via ORAL
  Filled 2022-12-10: qty 30

## 2022-12-10 MED ORDER — ALBUTEROL SULFATE HFA 108 (90 BASE) MCG/ACT IN AERS
2.0000 | INHALATION_SPRAY | RESPIRATORY_TRACT | Status: DC | PRN
  Administered 2022-12-11 – 2022-12-17 (×12): 2 via RESPIRATORY_TRACT
  Filled 2022-12-10: qty 6.7

## 2022-12-10 MED ORDER — INSULIN ASPART 100 UNIT/ML IJ SOLN
0.0000 [IU] | Freq: Three times a day (TID) | INTRAMUSCULAR | Status: DC
  Administered 2022-12-10: 3 [IU] via SUBCUTANEOUS
  Administered 2022-12-10 (×2): 11 [IU] via SUBCUTANEOUS
  Filled 2022-12-10: qty 0.15

## 2022-12-10 MED ORDER — METFORMIN HCL 500 MG PO TABS: 500.0000 mg | ORAL_TABLET | Freq: Two times a day (BID) | ORAL | Status: DC

## 2022-12-10 MED ORDER — DIAZEPAM 5 MG PO TABS
5.0000 mg | ORAL_TABLET | Freq: Two times a day (BID) | ORAL | Status: DC | PRN
  Administered 2022-12-10: 5 mg via ORAL
  Filled 2022-12-10: qty 1

## 2022-12-10 MED ORDER — ZIPRASIDONE MESYLATE 20 MG IM SOLR
20.0000 mg | Freq: Once | INTRAMUSCULAR | Status: AC
  Administered 2022-12-10: 20 mg via INTRAMUSCULAR
  Filled 2022-12-10: qty 20

## 2022-12-10 MED ORDER — OXYMETAZOLINE HCL 0.05 % NA SOLN
1.0000 | Freq: Two times a day (BID) | NASAL | Status: DC | PRN
  Administered 2022-12-10: 1 via NASAL
  Filled 2022-12-10: qty 30

## 2022-12-10 MED ORDER — OLANZAPINE 5 MG PO TBDP: 5.0000 mg | ORAL_TABLET | Freq: Every day | ORAL | Status: DC

## 2022-12-10 MED ORDER — METFORMIN HCL 500 MG PO TABS
500.0000 mg | ORAL_TABLET | Freq: Two times a day (BID) | ORAL | Status: DC
  Filled 2022-12-10 (×5): qty 1

## 2022-12-10 MED ORDER — OXYMETAZOLINE HCL 0.05 % NA SOLN
1.0000 | Freq: Two times a day (BID) | NASAL | Status: DC | PRN
  Administered 2022-12-11 – 2022-12-15 (×10): 1 via NASAL
  Filled 2022-12-10: qty 30

## 2022-12-10 MED ORDER — STERILE WATER FOR INJECTION IJ SOLN
INTRAMUSCULAR | Status: AC
  Filled 2022-12-10: qty 10

## 2022-12-10 MED ORDER — INSULIN DETEMIR 100 UNIT/ML ~~LOC~~ SOLN
10.0000 [IU] | Freq: Every day | SUBCUTANEOUS | Status: DC
  Administered 2022-12-10: 10 [IU] via SUBCUTANEOUS

## 2022-12-10 NOTE — ED Notes (Signed)
Patient appears to be responding to internal stimuli at this time. Patient appears to be having conversations with hallucinations.

## 2022-12-10 NOTE — ED Notes (Addendum)
Pt. Belongings is under the computer stand by 16-22

## 2022-12-10 NOTE — ED Provider Notes (Signed)
Spoke to [employer] significant other at [phone]. States that she hasn't been taking anything for her diabetes recently. Takes metformin and levothyroxine but unsure the dose. Has not been prescribed insulin.  Since she has been off her metformin for a prolonged time would recommend restarting at 500 twice daily.  Prescription sent for metformin to her pharmacy.  Will have her follow-up with her primary doctor regarding this and for up titration.   Fransico Meadow, MD 12/10/22 1149

## 2022-12-10 NOTE — ED Provider Notes (Signed)
Emergency Medicine Observation Re-evaluation Note  Trannie Bardales is a 42 y.o. female, seen on rounds today.  Pt initially presented to the ED for complaints of Psychiatric Evaluation and Suicidal Currently, the patient has no complaints.  Has been hyperglycemic and states that this is due to taking prednisone and that she does not take any medicine for diabetes at home.  Physical Exam  BP 106/69 (BP Location: Right Arm)   Pulse 77   Temp 97.6 F (36.4 C) (Oral)   Resp 16   SpO2 99%  Physical Exam General: Resting comfortably in stretcher.  Still exhibiting pressured speech Lungs: Normal work of breathing Psych: Pressured speech mildly anxious appearing  ED Course / MDM  EKG:EKG Interpretation  Date/Time:  Wednesday December 09 2022 19:23:03 EST Ventricular Rate:  81 PR Interval:  148 QRS Duration: 86 QT Interval:  392 QTC Calculation: 455 R Axis:   -19 Text Interpretation: Sinus rhythm Borderline left axis deviation Baseline wander in lead(s) V4 V5 V6 Confirmed by Palumbo, April ([id number]) on 12/10/2022 9:05:28 AM  I have reviewed the labs performed to date as well as medications administered while in observation.  Recent changes in the last 24 hours include hyperglycemic without DKA.  Plan  Current plan is for possible [facility] placement.   #DM2/Hyperglycemia - Patient appeears to be hyperglycemic but not in DKA.   - SSI - Talk to significant other regarding home regimen since pt unable to provide    Fransico Meadow, MD 12/10/22 1054
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
## 2022-12-10 NOTE — ED Notes (Signed)
Report given to [place], Therapist, sports at [facility].
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
## 2022-12-10 NOTE — Progress Notes (Signed)
Pt in dayroom responding loudly to internal stimuli. Pt is observed having pressured speech and aggression towards the voices. Patient's tone of voice calmed before redirection needed. Nurse notified of display of positive symptoms.

## 2022-12-10 NOTE — ED Notes (Signed)
Pt refusing blood sugar to be checked reporting "my blood sugar has never been a problem and I am refusing that and also refusing any more medicine. Y'all are not allowed to do anything to me without a warrant."  Pt educated on importance of checking blood sugar and receiving insulin prior to eating meal, pt continues to refuse care. MD made aware and to bedside to assess pt. MD provided IVC papers at this time.
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
## 2022-12-10 NOTE — Tx Team (Signed)
Initial Treatment Plan 12/10/2022 10:18 PM Erika Rhodes IOX:[id number]    PATIENT STRESSORS: Health problems   Marital or family conflict   Medication change or noncompliance     PATIENT STRENGTHS: Average or above average intelligence  General fund of knowledge    PATIENT IDENTIFIED PROBLEMS: Self harm thoughts  Paranoia                   DISCHARGE CRITERIA:  Improved stabilization in mood, thinking, and/or behavior Verbal commitment to aftercare and medication compliance  PRELIMINARY DISCHARGE PLAN: Outpatient therapy Return to previous living arrangement  PATIENT/FAMILY INVOLVEMENT: This treatment plan has been presented to and reviewed with the patient, Erika Rhodes.  The patient has been given the opportunity to ask questions and make suggestions.  Lance Bosch, RN 12/10/2022, 10:18 PM

## 2022-12-10 NOTE — Progress Notes (Signed)
Patient was prescribed zyprexa but she refused stating that she is allergic to all antipsychotics and will have a dystonic reaction. Provider alerted.

## 2022-12-10 NOTE — Progress Notes (Signed)
Pt walked on the unit with her nurse and stated in a very sarcastic tone" there's Emersynn Deatley, one of my favorite nurses"

## 2022-12-10 NOTE — Discharge Instructions (Signed)
Please follow-up with your primary doctor regarding your elevated blood sugars.  Start taking the metformin 500 daily with meals.  This may need to be increased based on her blood sugars.

## 2022-12-10 NOTE — ED Notes (Signed)
[facility] took pt over to [facility]. All belongings with pt.

## 2022-12-10 NOTE — ED Provider Notes (Signed)
Patient signed pending repeat glucose.  In brief presented with suicidal ideation.  Appears manic and delusional.  Has a history of the same.  At behavioral health admission in November 2022 with similar presentation.  1:13 AM Informed by nursing that she was refusing repeat CBG.  On my evaluation she has pressured speech.  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  She did present with suicidal ideation and needs medical clearance for full psychiatric evaluation.  Patient states that "I do not have diabetes and your tests are rigged."  I discussed with the patient that I would need to IVC her if she tried to leave and further refuse medical care as this is able lysing for her psychiatric evaluation. Patient stated "you do which you need to do."  I tried to convince her to allow [place] to obtain a CBG but patient continued to refuse.  She does not appear to have good insight and given her acute suicidality and psychosis, do not feel she has capacity to make medical decisions on her own.  IVC paperwork was placed.  Repeat CBG was obtained and is in the 300s.  I have placed orders for sliding scale insulin.  Additionally she was given a dose of Geodon as she appears acutely manic.  From a medical perspective, she is medically cleared for psychiatric evaluation.  Will have TTS evaluate.   Physical Exam  BP 128/68   Pulse 68   Temp 98 F (36.7 C)   Resp 16   SpO2 100%   Physical Exam Awake alert Yelling with pressured speech  Procedures  Procedures  ED Course / MDM    Medical Decision Making Amount and/or Complexity of Data Reviewed Labs: ordered.  Risk Prescription drug management.   Problem List Items Addressed This Visit   None Visit Diagnoses     Psychosis, unspecified psychosis type ([place])    -  Primary   Hyperglycemia                 Dina Rich, Barbette Hair, MD 12/10/22 [phone]

## 2022-12-10 NOTE — ED Notes (Signed)
Patient refusing to take Cymbalta at this time stating "it will just make things worse for (her) since (she) is already suicidal." MD aware.

## 2022-12-11 ENCOUNTER — Encounter (HOSPITAL_COMMUNITY): Payer: Self-pay

## 2022-12-11 ENCOUNTER — Encounter (HOSPITAL_COMMUNITY): Payer: Self-pay | Admitting: Nurse Practitioner

## 2022-12-11 DIAGNOSIS — F19959 Other psychoactive substance use, unspecified with psychoactive substance-induced psychotic disorder, unspecified: Secondary | ICD-10-CM

## 2022-12-11 HISTORY — DX: Other psychoactive substance use, unspecified with psychoactive substance-induced psychotic disorder, unspecified: F19.959

## 2022-12-11 LAB — GLUCOSE, CAPILLARY
Glucose-Capillary: 206 mg/dL — ABNORMAL HIGH (ref 70–99)
Glucose-Capillary: 422 mg/dL — ABNORMAL HIGH (ref 70–99)
Glucose-Capillary: 348 mg/dL — ABNORMAL HIGH (ref 70–99)
Glucose-Capillary: 453 mg/dL — ABNORMAL HIGH (ref 70–99)
Glucose-Capillary: 307 mg/dL — ABNORMAL HIGH (ref 70–99)

## 2022-12-11 MED ORDER — INSULIN ASPART 100 UNIT/ML IJ SOLN
0.0000 [IU] | Freq: Three times a day (TID) | INTRAMUSCULAR | Status: DC
  Administered 2022-12-11: 11 [IU] via SUBCUTANEOUS
  Administered 2022-12-12: 8 [IU] via SUBCUTANEOUS
  Administered 2022-12-12 (×2): 15 [IU] via SUBCUTANEOUS

## 2022-12-11 MED ORDER — CHLORPROMAZINE HCL 25 MG PO TABS
50.0000 mg | ORAL_TABLET | Freq: Three times a day (TID) | ORAL | Status: DC | PRN
  Administered 2022-12-12: 50 mg via ORAL

## 2022-12-11 MED ORDER — WHITE PETROLATUM EX OINT
TOPICAL_OINTMENT | CUTANEOUS | Status: AC
  Filled 2022-12-11: qty 5

## 2022-12-11 MED ORDER — POTASSIUM CHLORIDE CRYS ER 20 MEQ PO TBCR
20.0000 meq | EXTENDED_RELEASE_TABLET | Freq: Two times a day (BID) | ORAL | Status: AC
  Administered 2022-12-11 (×2): 20 meq via ORAL
  Filled 2022-12-11 (×2): qty 1

## 2022-12-11 MED ORDER — OLANZAPINE 10 MG PO TBDP: 10.0000 mg | ORAL_TABLET | Freq: Three times a day (TID) | ORAL | Status: DC | PRN

## 2022-12-11 MED ORDER — PREDNISONE 20 MG PO TABS
30.0000 mg | ORAL_TABLET | Freq: Every day | ORAL | Status: AC
  Administered 2022-12-12: 30 mg via ORAL
  Filled 2022-12-11 (×2): qty 1

## 2022-12-11 MED ORDER — INSULIN ASPART 100 UNIT/ML IJ SOLN: 0.0000 [IU] | Freq: Three times a day (TID) | INTRAMUSCULAR | Status: DC

## 2022-12-11 MED ORDER — PREDNISONE 20 MG PO TABS: 20.0000 mg | ORAL_TABLET | Freq: Every day | ORAL | Status: DC

## 2022-12-11 MED ORDER — LORAZEPAM 1 MG PO TABS
2.0000 mg | ORAL_TABLET | Freq: Four times a day (QID) | ORAL | Status: DC | PRN
  Administered 2022-12-12 – 2022-12-17 (×9): 2 mg via ORAL
  Filled 2022-12-11 (×9): qty 2

## 2022-12-11 MED ORDER — PALIPERIDONE ER 6 MG PO TB24
6.0000 mg | ORAL_TABLET | Freq: Every day | ORAL | Status: DC
  Filled 2022-12-11: qty 1

## 2022-12-11 MED ORDER — PREDNISONE 20 MG PO TABS
20.0000 mg | ORAL_TABLET | Freq: Every day | ORAL | Status: AC
  Administered 2022-12-13: 20 mg via ORAL
  Filled 2022-12-11 (×2): qty 1

## 2022-12-11 MED ORDER — METFORMIN HCL 500 MG PO TABS
1000.0000 mg | ORAL_TABLET | Freq: Two times a day (BID) | ORAL | Status: DC
  Filled 2022-12-11 (×2): qty 2

## 2022-12-11 MED ORDER — PALIPERIDONE ER 6 MG PO TB24
6.0000 mg | ORAL_TABLET | Freq: Every day | ORAL | Status: DC
  Administered 2022-12-11 – 2022-12-13 (×3): 6 mg via ORAL
  Filled 2022-12-11 (×5): qty 1

## 2022-12-11 MED ORDER — METFORMIN HCL ER 500 MG PO TB24
500.0000 mg | ORAL_TABLET | Freq: Two times a day (BID) | ORAL | Status: DC
  Administered 2022-12-11 – 2022-12-12 (×3): 500 mg via ORAL
  Filled 2022-12-11 (×6): qty 1

## 2022-12-11 MED ORDER — LORAZEPAM 2 MG/ML IJ SOLN
2.0000 mg | Freq: Four times a day (QID) | INTRAMUSCULAR | Status: DC | PRN
  Administered 2022-12-11: 2 mg via INTRAMUSCULAR
  Filled 2022-12-11: qty 1

## 2022-12-11 MED ORDER — ZIPRASIDONE MESYLATE 20 MG IM SOLR: 20.0000 mg | INTRAMUSCULAR | Status: DC | PRN

## 2022-12-11 MED ORDER — INSULIN DETEMIR 100 UNIT/ML ~~LOC~~ SOLN
15.0000 [IU] | Freq: Every day | SUBCUTANEOUS | Status: DC
  Administered 2022-12-11 – 2022-12-15 (×5): 15 [IU] via SUBCUTANEOUS

## 2022-12-11 MED ORDER — DIAZEPAM 5 MG PO TABS
5.0000 mg | ORAL_TABLET | Freq: Two times a day (BID) | ORAL | Status: DC | PRN
  Administered 2022-12-11 – 2022-12-17 (×6): 5 mg via ORAL
  Filled 2022-12-11 (×7): qty 1

## 2022-12-11 MED ORDER — CHLORPROMAZINE HCL 25 MG/ML IJ SOLN
50.0000 mg | Freq: Three times a day (TID) | INTRAMUSCULAR | Status: DC | PRN
  Administered 2022-12-11: 50 mg via INTRAMUSCULAR
  Filled 2022-12-11: qty 2

## 2022-12-11 MED ORDER — LORAZEPAM 1 MG PO TABS
1.0000 mg | ORAL_TABLET | ORAL | Status: AC | PRN
  Administered 2022-12-13: 1 mg via ORAL
  Filled 2022-12-11: qty 1

## 2022-12-11 MED ORDER — CHLORPROMAZINE HCL 25 MG/ML IJ SOLN
50.0000 mg | Freq: Every day | INTRAMUSCULAR | Status: DC
  Administered 2022-12-13: 50 mg via INTRAMUSCULAR
  Filled 2022-12-11 (×4): qty 2

## 2022-12-11 MED ORDER — PREDNISONE 10 MG PO TABS
10.0000 mg | ORAL_TABLET | Freq: Every day | ORAL | Status: AC
  Administered 2022-12-14: 10 mg via ORAL
  Filled 2022-12-11: qty 1

## 2022-12-11 NOTE — Progress Notes (Signed)
Pt A & O to self, place and situation. Denies HI, AVH and pain when assessed. Presents with blunted affect, disorganized, irritable mood, fidgety / pacing in milieu conversing with self at intervals. Mood has been labile with pressured, rapid tangential speech. Received PRN Valium at [phone] for anxiety with minimal relief with assessed due to increased agitation. Pt escalated to banging her head with an open palm at medication window and was verbally abusive towards writer with racial slurs "Well, maybe if I hit my head, self harm then you will call the doctor. let me do that now nurse Bular Hickok, you nigger then since you said he's coming and got to see other people over there, nigger". Disruptive in milieu, yelling, slammed her door. Assigned provider made aware, new order received for PRN Ativan 2 mg PO / IM. Pt received PRN Thorazine 50 mg and Ativan 2 mg both IM in left deltoid at 1322 "Stick me with that needle bitch even though you know I will have a dystonic reaction to that medication". Pt noted asleep when reassessed at 1420. Refused scheduled Metformin when offered "I don't want it, I'm not diabetic, that a truman's syndrome, everything and everybody in here is fake. I think you and I are the real people. They did surgery on that other patient so he will not run or play sport. I'm not taking any pill I don't want to". Safety checks maintained at Q 15 minutes intervals. Continued verbal redirections offered this shift due to impulsive, intrusive behavior and explosive outbursts in milieu and in cafeteria with peers during breakfast. Emotional support, encouragement and reassurance offered. Pt tolerates meals and fluids well. Safety maintained on unit.
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
## 2022-12-11 NOTE — Progress Notes (Signed)
NP Hessie Diener Bobbitt gave orders to give Levemir at 2100 and recheck one hour later. She will then determine whether to add a sliding scale for bedtime. Writer will inform patient of this order.

## 2022-12-11 NOTE — Progress Notes (Signed)
[facility] Second Physician Opinion Progress Note for Medication Administration to Non-consenting Patients (For Involuntarily Committed Patients)  Patient: Erika Rhodes Date of Birth: [id number] MRN: [id number]  Reason for the Medication: The patient, without the benefit of the specific treatment measure, is incapable of participating in any available treatment plan that will give the patient a realistic opportunity of improving the patient's condition.  Consideration of Side Effects: Consideration of the side effects related to the medication plan has been given.  Rationale for Medication Administration: The patient is a 42 year old Caucasian female with a long history of depression, psychosis and possibly bipolar type I disorder who has been noncompliant with medications.  She also has rheumatoid arthritis and has been on prednisone therapy in the past.  She was admitted with a history of suicidal and homicidal ideations and agitation.  Since admission patient has been refusing all medications and has been quite labile.  When seen today she is extremely hyperverbal, labile and irritable.  She has multiple delusions.  She does not believe she needs any medication than Valium.  The patient has demonstrated that with proper treatment she can have a better quality of life and can be functional and take care of her ADLs.  Her current mental status demonstrates that she is in need of acute stabilization with medications.   Ranae Palms, MD 12/11/22  4:54 PM   This documentation is good for (7) seven days from the date of the MD signature. New documentation must be completed every seven (7) days with detailed justification in the medical record if the patient requires continued non-emergent administration of psychotropic medications.

## 2022-12-11 NOTE — BHH Suicide Risk Assessment (Signed)
[facility] Admission Suicide Risk Assessment  Nursing information obtained from: Patient Demographic factors: Divorced or widowed, Caucasian, Unemployed Current Mental Status: Suicidal ideation indicated by patient, Self-harm thoughts, Thoughts of violence towards others Loss Factors: Decline in physical health Historical Factors: Impulsivity Risk Reduction Factors: Living with another person, especially a relative  Principal Problem: Bipolar I disorder ([place]) Diagnosis: Principal Problem:   Bipolar I disorder ([place]) Active Problems:   Cannabis use disorder, moderate, dependence ([place])   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 female with PMH of BiPD1, benzo-induced psychosis, benzo use d/o, cannabis use d/o, BoPD, multiple suicide attempts, multiple inpatient psych admission, DM2, uncontrolled, who presented to voluntary to [facility] (12/09/2022) for a psychosis, mania, HI (towards partner), SI five days after starting a prednisone taper 6 days prior, then transferred Involuntary (EDP) to [facility] (12/10/2022) for stabilization of acute mania and psychosis.  Continued Clinical Symptoms:  Alcohol Use Disorder Identification Test Final Score (AUDIT): 1 The "Alcohol Use Disorders Identification Test", Guidelines for Use in Primary Care, Second Edition.  World Pharmacologist [facility]). Score between 0-7:  no or low risk or alcohol related problems. Score between 8-15:  moderate risk of alcohol related problems. Score between 16-19:  high risk of alcohol related problems. Score 20 or above:  warrants further diagnostic evaluation for alcohol dependence and treatment.  CLINICAL FACTORS:  Bipolar Disorder:   Mixed State Alcohol/Substance Abuse/Dependencies Personality Disorders:    Cluster B More than one psychiatric diagnosis Currently Psychotic Unstable or Poor Therapeutic Relationship Previous Psychiatric Diagnoses and Treatments Medical Diagnoses and Treatments/Surgeries  Musculoskeletal: Strength & Muscle Tone: within normal limits Gait & Station: normal Patient leans: N/A  Presentation  General Appearance:Bizarre, Disheveled Eye Contact:Good Speech:Pressured Volume:Increased Handedness:Right  Mood and Affect  Mood:Dysphoric, Labile, Irritable Affect:Appropriate, Congruent, Labile  Thought Process  Thought Process:Disorganized Descriptions of Associations:Tangential  Thought Content Suicidal Thoughts:Suicidal Thoughts: No Homicidal Thoughts:Homicidal Thoughts: No Hallucinations:Hallucinations: None Ideas of Reference:None Thought Content:Delusions, Illogical, Paranoid Ideation, Perseveration, Rumination, Scattered, Tangential  Sensorium  Memory:Immediate Poor Judgment:Impaired Insight:None  Executive Functions  Orientation:Full (Time, Place and Person) [place] Concentration:Poor Attention:Poor [place] of Knowledge:Fair  Psychomotor Activity  Psychomotor Activity:Psychomotor Activity: Normal  Assets  Assets:Communication Skills, Desire for Improvement, Housing, Resilience, Social Support  Sleep  Quality:Fair  COGNITIVE FEATURES THAT CONTRIBUTE TO RISK:  Closed-mindedness, Loss of executive function, Polarized thinking, and Thought constriction (tunnel vision)    SUICIDE RISK:  Severe: Frequent, intense, and enduring suicidal ideation, specific plan, no subjective intent, but some objective markers of intent (i.e., choice of lethal method), the method is accessible, some limited preparatory behavior, evidence of impaired self-control, severe dysphoria/symptomatology, multiple risk factors present, and few if any protective factors, particularly a lack of social support.  PLAN OF CARE:  Patient met requirement for  inpatient psychiatric hospitalization and has been admitted.  See H&P & attending's attestation for detailed plan.  I certify that inpatient services furnished can reasonably be expected to improve the patient's condition.  Total Time spent with patient:  See attending attestation  Signed: Merrily Brittle, DO Psychiatry Resident, PGY-2 [facility] 12/11/2022, 6:36 PM
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
## 2022-12-11 NOTE — Progress Notes (Signed)
   12/11/22 0625  15 Minute Checks  Location Dayroom  Visual Appearance Calm  Behavior Composed  Sleep (Behavioral Health Patients Only)  Calculate sleep? (Click Yes once per 24 hr at 0600 safety check) Yes  Documented sleep last 24 hours 4.75

## 2022-12-11 NOTE — Progress Notes (Signed)
Patient approached the cafeteria serving line for seconds. This Probation officer politely told her she may be asked by the server to wait until trays were made. Pt complained rules changing all the time and the server told her to come back up. Pt stormed off to her seat proclaiming " I guess I will just not eat then!!" Pt returned to the line pointing me out reporting I told her she could not get anymore food. Pt glared at writer mouthing F U on her way back to her seat. Pt labile and unprovoked. Nurse notified.

## 2022-12-11 NOTE — Progress Notes (Signed)
Adult Psychoeducational Group Note  Date:  12/11/2022 Time:  9:09 PM  Group Topic/Focus:  Wrap-Up Group:   The focus of this group is to help patients review their daily goal of treatment and discuss progress on daily workbooks.  Participation Level:  Did Not Attend  Participation Quality:   Did Not Attend  Affect:   Did Not Attend  Cognitive:   Did Not Attend  Insight: None  Engagement in Group:   Did Not Attend  Modes of Intervention:   Did Not Attend  Additional Comments:  Pt was encouraged to attend wrap up group but did not attend.  Candy Sledge 12/11/2022, 9:09 PM

## 2022-12-11 NOTE — BH IP Treatment Plan (Signed)
Interdisciplinary Treatment and Diagnostic Plan New  12/11/2022 Time of Session: [place] MRN: [id number]  Principal Diagnosis: Major depressive disorder, recurrent severe without psychotic features ([place])  Secondary Diagnoses: Principal Problem:   Major depressive disorder, recurrent severe without psychotic features ([place])   Current Medications:  Current Facility-Administered Medications  Medication Dose Route Frequency Provider Last Rate Last Admin   acetaminophen (TYLENOL) tablet 650 mg  650 mg Oral Q6H PRN Charmaine Downs C, NP   650 mg at 12/10/22 2129   albuterol (VENTOLIN HFA) 108 (90 Base) MCG/ACT inhaler 2 puff  2 puff Inhalation Q4H PRN Charmaine Downs C, NP   2 puff at 12/11/22 1201   alum & mag hydroxide-simeth (MAALOX/MYLANTA) 200-200-20 MG/5ML suspension 30 mL  30 mL Oral Q4H PRN Delfin Gant, NP       chlorproMAZINE (THORAZINE) injection 50 mg  50 mg Intramuscular TID PRN Janine Limbo, MD   50 mg at 12/11/22 1322   chlorproMAZINE (THORAZINE) tablet 50 mg  50 mg Oral TID PRN Janine Limbo, MD       diazepam (VALIUM) tablet 5 mg  5 mg Oral Q12H PRN Massengill, Ovid Curd, MD       insulin aspart (novoLOG) injection 0-15 Units  0-15 Units Subcutaneous TID WC Merrily Brittle, DO       insulin detemir (LEVEMIR) injection 15 Units  15 Units Subcutaneous QHS Merrily Brittle, DO       LORazepam (ATIVAN) tablet 2 mg  2 mg Oral Q6H PRN Massengill, Ovid Curd, MD       Or   LORazepam (ATIVAN) injection 2 mg  2 mg Intramuscular Q6H PRN Massengill, Ovid Curd, MD   2 mg at 12/11/22 1323   magnesium hydroxide (MILK OF MAGNESIA) suspension 30 mL  30 mL Oral Daily PRN Delfin Gant, NP       metFORMIN (GLUCOPHAGE-XR) 24 hr tablet 500 mg  500 mg Oral BID WC Merrily Brittle, DO       OLANZapine zydis (ZYPREXA) disintegrating tablet 5 mg  5 mg Oral QHS [place], Josephine C, NP       oxymetazoline (AFRIN) 0.05 % nasal spray 1 spray  1 spray Each Nare BID PRN Charmaine Downs  C, NP   1 spray at 12/11/22 0457   potassium chloride SA (KLOR-CON M) CR tablet 20 mEq  20 mEq Oral BID Massengill, Ovid Curd, MD   20 mEq at 12/11/22 1001   predniSONE (DELTASONE) tablet 40 mg  40 mg Oral Q breakfast Charmaine Downs C, NP   40 mg at 12/11/22 8144   PTA Medications: Medications Prior to Admission  Medication Sig Dispense Refill Last Dose   albuterol (PROVENTIL) (2.5 MG/3ML) 0.083% nebulizer solution Take 3 mLs (2.5 mg total) by nebulization every 6 (six) hours as needed for wheezing or shortness of breath. (Patient taking differently: Take 2.5 mg by nebulization as needed for wheezing or shortness of breath.) 75 mL 12    albuterol (VENTOLIN HFA) 108 (90 Base) MCG/ACT inhaler Inhale 1-2 puffs into the lungs every 4 (four) hours as needed for wheezing or shortness of breath. (Patient not taking: Reported on 12/10/2022) 1 each 0    Aspirin-Acetaminophen-Caffeine (GOODY HEADACHE PO) Take 1 packet by mouth as needed (headache,pain).      clindamycin (CLEOCIN T) 1 % lotion Apply 1 application  topically daily as needed (breakouts). To affected area on face      diazepam (VALIUM) 5 MG tablet Take 1 tablet (5 mg total) by mouth every 6 (six)  hours as needed for anxiety. (Patient taking differently: Take 5 mg by mouth 2 (two) times daily.) 12 tablet 0    doxycycline (VIBRA-TABS) 100 MG tablet Take 100 mg by mouth 2 (two) times daily.      Fexofenadine HCl (ALLEGRA PO) Take 1 tablet by mouth daily.      ibuprofen (ADVIL) 200 MG tablet Take 800 mg by mouth as needed for mild pain or moderate pain.      metFORMIN (GLUCOPHAGE) 500 MG tablet Take 1 tablet (500 mg total) by mouth 2 (two) times daily with a meal. 60 tablet 0    metoprolol tartrate (LOPRESSOR) 25 MG tablet Take 0.5 tablets (12.5 mg total) by mouth 2 (two) times daily for 30 doses. 15 tablet 0    naproxen sodium (ALEVE) 220 MG tablet Take 440 mg by mouth daily as needed (pain).      omeprazole (PRILOSEC OTC) 20 MG tablet Take 20 mg by  mouth daily.      oxymetazoline (AFRIN) 0.05 % nasal spray Place 2 sprays into both nostrils as needed for congestion.      predniSONE (DELTASONE) 10 MG tablet Take 6 tablets (60 mg total) by mouth daily with breakfast for 6 days, THEN 4 tablets (40 mg total) daily with breakfast for 3 days, THEN 2 tablets (20 mg total) daily with breakfast for 3 days. 54 tablet 0    predniSONE (DELTASONE) 20 MG tablet 3 Tabs PO Days 1-3, then 2 tabs PO Days 4-6, then 1 tab PO Day 7-9, then Half Tab PO Day 10-12 (Patient not taking: Reported on 12/10/2022) 20 tablet 0    tretinoin (RETIN-A) 0.05 % cream Apply topically at bedtime. (Patient not taking: Reported on 12/10/2022)       Patient Stressors: Health problems   Marital or family conflict   Medication change or noncompliance    Patient Strengths: Average or above average intelligence  General fund of knowledge   Treatment Modalities: Medication Management, Group therapy, Case management,  1 to 1 session with clinician, Psychoeducation, Recreational therapy.   Physician Treatment Plan for Primary Diagnosis: Major depressive disorder, recurrent severe without psychotic features ([place]) Long Term Goal(s):     Short Term Goals:    Medication Management: Evaluate patient's response, side effects, and tolerance of medication regimen.  Therapeutic Interventions: 1 to 1 sessions, Unit Group sessions and Medication administration.  Evaluation of Outcomes: Progressing  Physician Treatment Plan for Secondary Diagnosis: Principal Problem:   Major depressive disorder, recurrent severe without psychotic features ([place])  Long Term Goal(s):     Short Term Goals:       Medication Management: Evaluate patient's response, side effects, and tolerance of medication regimen.  Therapeutic Interventions: 1 to 1 sessions, Unit Group sessions and Medication administration.  Evaluation of Outcomes: Progressing   RN Treatment Plan for Primary Diagnosis: Major depressive  disorder, recurrent severe without psychotic features ([place]) Long Term Goal(s): Knowledge of disease and therapeutic regimen to maintain health will improve  Short Term Goals: Ability to remain free from injury will improve, Ability to verbalize frustration and anger appropriately will improve, Ability to demonstrate self-control, Ability to participate in decision making will improve, Ability to verbalize feelings will improve, Ability to disclose and discuss suicidal ideas, Ability to identify and develop effective coping behaviors will improve, and Compliance with prescribed medications will improve  Medication Management: RN will administer medications as ordered by provider, will assess and evaluate patient's response and provide education to patient for prescribed medication.  RN will report any adverse and/or side effects to prescribing provider.  Therapeutic Interventions: 1 on 1 counseling sessions, Psychoeducation, Medication administration, Evaluate responses to treatment, Monitor vital signs and CBGs as ordered, Perform/monitor CIWA, COWS, AIMS and Fall Risk screenings as ordered, Perform wound care treatments as ordered.  Evaluation of Outcomes: Progressing   [name] Treatment Plan for Primary Diagnosis: Major depressive disorder, recurrent severe without psychotic features ([place]) Long Term Goal(s): Safe transition to appropriate next level of care at discharge, Engage patient in therapeutic group addressing interpersonal concerns.  Short Term Goals: Engage patient in aftercare planning with referrals and resources, Increase social support, Increase ability to appropriately verbalize feelings, Increase emotional regulation, Facilitate acceptance of mental health diagnosis and concerns, Facilitate patient progression through stages of change regarding substance use diagnoses and concerns, Identify triggers associated with mental health/substance abuse issues, and Increase skills for wellness and  recovery  Therapeutic Interventions: Assess for all discharge needs, 1 to 1 time with Social worker, Explore available resources and support systems, Assess for adequacy in community support network, Educate family and significant other(s) on suicide prevention, Complete Psychosocial Assessment, Interpersonal group therapy.  Evaluation of Outcomes: Progressing   Progress in Treatment: Attending groups: No. Participating in groups: No. Taking medication as prescribed: Yes. Toleration medication: Yes. Family/Significant other contact made: No, will contact:   Elberta Fortis (Boyfriend)- [phone] Patient understands diagnosis: Yes. Discussing patient identified problems/goals with staff: Yes. Medical problems stabilized or resolved: Yes. Denies suicidal/homicidal ideation: Yes. Issues/concerns per patient self-inventory: Yes. Other:   New problem(s) identified: No, Describe:  None Reported  New Short Term/Long Term Goal(s): stabilization, elimination of SI thoughts, development of comprehensive mental wellness plan.  medication   Patient Goals:  None Reported  Discharge Plan or Barriers: Patient recently admitted. CSW will continue to follow and assess for appropriate referrals and possible discharge planning.     Reason for Continuation of Hospitalization: Depression Medication stabilization Suicidal ideation Delusions  Estimated Length of Stay: 3-7 Days  Last [place] Suicide Severity Risk Score: [place] Admission (Current) from 12/10/2022 in [facility] ED from 12/09/2022 in [facility] ED from 12/03/2022 in [facility] at [facility] High Risk High Risk No Risk       Last [facility] 2/9 Scores:    10/01/2021    4:29 PM  Depression screen PHQ 2/9  Decreased Interest 0  Down, Depressed, Hopeless 0  PHQ - 2 Score 0   detox, medication  management for mood stabilization; elimination of SI thoughts; development of comprehensive mental wellness/sobriety plan   Scribe for Treatment Team: Windle Guard, [name] 12/11/2022 2:16 PM

## 2022-12-11 NOTE — BHH Group Notes (Signed)
Spirituality group facilitated by Chaplain Katy Melenie Minniear, BCC.   Group Description: Group focused on topic of hope. Patients participated in facilitated discussion around topic, connecting with one another around experiences and definitions for hope. Group members engaged with visual explorer photos, reflecting on what hope looks like for them today. Group engaged in discussion around how their definitions of hope are present today in hospital.   Modalities: Psycho-social ed, Adlerian, Narrative, MI   Patient Progress: Did not attend.  
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
## 2022-12-11 NOTE — BHH Suicide Risk Assessment (Signed)
[place] INPATIENT:  Family/Significant Other Suicide Prevention Education  Suicide Prevention Education:  Education Completed; Elberta Fortis, [phone]  (name of family member/significant other) has been identified by the patient as the family member/significant other with whom the patient will be residing, and identified as the person(s) who will aid the patient in the event of a mental health crisis (suicidal ideations/suicide attempt).  With written consent from the patient, the family member/significant other has been provided the following suicide prevention education, prior to the and/or following the discharge of the patient.  CSW spoke with boyfriend who reports that patient is not med compliant and believes that the government and him are out against her and they are poisoning her things.  He reports that she believes her pharmacist and psychiatrist are also poisoning her.  No additional safety concerns and no access to guns/weapons.  Boyfriend states that she needs to take her medication and get these thoughts out of her head.   The suicide prevention education provided includes the following: Suicide risk factors Suicide prevention and interventions National Suicide Hotline telephone number [facility] assessment telephone number [facility] Emergency Assistance [place] and/or Residential Mobile Crisis Unit telephone number  Request made of family/significant other to: Remove weapons (e.g., guns, rifles, knives), all items previously/currently identified as safety concern.   Remove drugs/medications (over-the-counter, prescriptions, illicit drugs), all items previously/currently identified as a safety concern.  The family member/significant other verbalizes understanding of the suicide prevention education information provided.  The family member/significant other agrees to remove the items of safety concern listed above.  Nelle Sayed E Chinwe Lope 12/11/2022, 2:31 PM

## 2022-12-11 NOTE — Group Note (Signed)
Recreation Therapy Group Note   Group Topic:Problem Solving  Group Date: 12/11/2022 Start Time: 1005 End Time: 1020 Facilitators: Bebe Moncure-McCall, LRT,CTRS Location: [address] Dayroom   Goal Area(s) Addresses:  Patient will define components of whole wellness. Patient will verbalize benefit of whole wellness.  Group Description: Mental Gymnastics. LRT and patients discussed the components of wellness (mental, physical and spiritual). LRT and patients also discussed the importance of wellness and how it affects [place] on a daily basis. LRT then gave patients two worksheets of brain teasers. LRT explained to patients instead of doing physical exercise, they were going to exercise their brains and solve the brain teasers presented on the worksheets. 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  Pt decided not to participate in activity.  Pt was sitting near the phone talking to herself and responding to voices.    Plan: Continue to engage patient in RT group sessions 2-3x/week.   Kathan Kirker-McCall, LRT,CTRS 12/11/2022 11:59 AM

## 2022-12-11 NOTE — Progress Notes (Addendum)
CBG rechecked and is 307, NP Shalon Bobbitt notified, no additional orders received. Patient is currently resting with no distress noted.
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
## 2022-12-12 DIAGNOSIS — F319 Bipolar disorder, unspecified: Secondary | ICD-10-CM | POA: Diagnosis not present

## 2022-12-12 LAB — LIPID PANEL
Cholesterol: 229 mg/dL — ABNORMAL HIGH (ref 0–200)
HDL: 52 mg/dL (ref >40)
LDL Cholesterol: 134 mg/dL — ABNORMAL HIGH (ref 0–99)
Total CHOL/HDL Ratio: 4.4 RATIO
Triglycerides: 213 mg/dL — ABNORMAL HIGH (ref <150)
VLDL: 43 mg/dL — ABNORMAL HIGH (ref 0–40)

## 2022-12-12 LAB — GLUCOSE, CAPILLARY
Glucose-Capillary: 252 mg/dL — ABNORMAL HIGH (ref 70–99)
Glucose-Capillary: 439 mg/dL — ABNORMAL HIGH (ref 70–99)
Glucose-Capillary: 580 mg/dL (ref 70–99)
Glucose-Capillary: 560 mg/dL (ref 70–99)
Glucose-Capillary: 363 mg/dL — ABNORMAL HIGH (ref 70–99)

## 2022-12-12 LAB — TSH: TSH: 3.588 u[IU]/mL (ref 0.350–4.500)

## 2022-12-12 MED ORDER — IBUPROFEN 400 MG PO TABS
400.0000 mg | ORAL_TABLET | Freq: Three times a day (TID) | ORAL | Status: DC
  Administered 2022-12-13 – 2022-12-17 (×13): 400 mg via ORAL
  Filled 2022-12-12 (×22): qty 1

## 2022-12-12 MED ORDER — INSULIN ASPART 100 UNIT/ML IJ SOLN: 6.0000 [IU] | Freq: Three times a day (TID) | INTRAMUSCULAR | Status: DC

## 2022-12-12 MED ORDER — INSULIN ASPART 100 UNIT/ML IJ SOLN
6.0000 [IU] | Freq: Three times a day (TID) | INTRAMUSCULAR | Status: DC
  Administered 2022-12-12 – 2022-12-16 (×13): 6 [IU] via SUBCUTANEOUS

## 2022-12-12 MED ORDER — INSULIN ASPART 100 UNIT/ML IJ SOLN
0.0000 [IU] | Freq: Three times a day (TID) | INTRAMUSCULAR | Status: DC
  Administered 2022-12-13: 15 [IU] via SUBCUTANEOUS
  Administered 2022-12-13: 20 [IU] via SUBCUTANEOUS
  Administered 2022-12-13 – 2022-12-14 (×2): 7 [IU] via SUBCUTANEOUS
  Administered 2022-12-14: 20 [IU] via SUBCUTANEOUS
  Administered 2022-12-14 – 2022-12-15 (×2): 4 [IU] via SUBCUTANEOUS
  Administered 2022-12-15: 3 [IU] via SUBCUTANEOUS
  Administered 2022-12-15: 15 [IU] via SUBCUTANEOUS
  Administered 2022-12-16: 4 [IU] via SUBCUTANEOUS
  Administered 2022-12-16 (×2): 7 [IU] via SUBCUTANEOUS
  Administered 2022-12-17: 4 [IU] via SUBCUTANEOUS

## 2022-12-12 MED ORDER — INSULIN ASPART 100 UNIT/ML IJ SOLN
0.0000 [IU] | Freq: Every day | INTRAMUSCULAR | Status: DC
  Administered 2022-12-12: 5 [IU] via SUBCUTANEOUS
  Administered 2022-12-13: 3 [IU] via SUBCUTANEOUS
  Administered 2022-12-14 – 2022-12-15 (×2): 4 [IU] via SUBCUTANEOUS
  Administered 2022-12-16: 2 [IU] via SUBCUTANEOUS

## 2022-12-12 MED ORDER — METFORMIN HCL ER 750 MG PO TB24
750.0000 mg | ORAL_TABLET | Freq: Two times a day (BID) | ORAL | Status: DC
  Administered 2022-12-13 – 2022-12-17 (×9): 750 mg via ORAL
  Filled 2022-12-12 (×14): qty 1

## 2022-12-12 MED ORDER — NICOTINE POLACRILEX 2 MG MT GUM
2.0000 mg | CHEWING_GUM | OROMUCOSAL | Status: DC | PRN
  Administered 2022-12-12 – 2022-12-17 (×10): 2 mg via ORAL
  Filled 2022-12-12 (×9): qty 1

## 2022-12-12 NOTE — B[facility] Group Notes (Signed)
Goals Group 12/12/22   Group Focus: affirmation, clarity of thought, and goals/reality orientation Treatment Modality:  Psychoeducation Interventions utilized were assignment, group exercise, and support Purpose: To be able to understand and verbalize the reason for their admission to the hospital. To understand that the medication helps with their chemical imbalance but they also need to work on their choices in life. To be challenged to develop a list of 30 positives about themselves. Also introduce the concept that "feelings" are not reality.  Participation Level:  did not attend  Ermin Parisien A 

## 2022-12-12 NOTE — Progress Notes (Addendum)
Pt A & O 3. Presents with intense eye contact, blunted affect and labile mood. Denies SI, HI, AVH and pain. However, pt observed in dayroom talking to self, actively responding to internal stimuli; "I talk to myself and have dialogue on how to improve things all the time. I do have a tape recorder at home where I record myself. I like to think aloud".  Easily agitated, oppositional and argumentative on interactions in reference to following unit rules related to medication compliance, straws and group attendance "I should have accommodations if I don't want to get up for medicines. Out of bed for request for valium despite multiple prompts for scheduled medications and groups "I'm not coming to you, are we starting this all over again". PRN Valium 5 mg PO given for anxiety 6/10 with desired effect when reassessed at 1200. CBG has been elevated this shift for both noon (439 mg/dl) and evening (580 mg /dl). Coverage given as per sliding scale order (15 units) both times. Assigned provider made aware, no new orders given at this time. Will recheck CBG post prandial. Pt continues to need frequent verbal redirections to comply with unit routines due to mood lability, explosive outburst. Unit restriction maintained for safety. Support, encouragement and reassurance provided to pt. Safety checks maintained at Q 15 minutes intervals. She tolerates meals, fluids and medications well. Safety maintained in milieu.

## 2022-12-12 NOTE — Inpatient Diabetes Management (Signed)
Inpatient Diabetes Program Recommendations  AACE/ADA: New Consensus Statement on Inpatient Glycemic Control (2015)  Target Ranges:  Prepandial:   less than 140 mg/dL      Peak postprandial:   less than 180 mg/dL (1-2 hours)      Critically ill patients:  140 - 180 mg/dL   Lab Results  Component Value Date   GLUCAP 439 (H) 12/12/2022   HGBA1C 10.7 (H) 12/09/2022    Review of Glycemic Control  Diabetes history: DM2 Outpatient Diabetes medications: metformin 500 mg BID Current orders for Inpatient glycemic control: Levemir 15 units QHS, Novolog 0-15 TID with meals, metformin XR 500 mg  Prednisone 30 mg QAM On 2/4, Pred 20 mg QAM On 2/5, Pred 10 mg QAM  Inpatient Diabetes Program Recommendations:    Consider adding Novolog 5 units TID with meals if eating > 50%  Consider adding Novolog 0-5 HS for correction insulin  If FBS > 180, increase Levemir to 18 units QHS  Will need to f/u with PCP regarding HgbA1C of 10.7% and poor glycemic control.  Continue to follow glucose trends.  Thank you. Lorenda Peck, RD, LDN, [place] Inpatient Diabetes Coordinator [phone]

## 2022-12-12 NOTE — Progress Notes (Signed)
Adult Psychoeducational Group Note  Date:  12/12/2022 Time:  11:18 PM  Group Topic/Focus:  Wrap-Up Group:   The focus of this group is to help patients review their daily goal of treatment and discuss progress on daily workbooks.  Participation Level:  Active  Participation Quality:  Appropriate  Affect:  Appropriate  Cognitive:  Appropriate  Insight: Appropriate  Engagement in Group:  Developing/Improving  Modes of Intervention:  Discussion  Additional Comments:  Pt stated her goal for today was to focus on her treatment plan and discuss her blood sugar issues with her doctor. Pt stated she accomplished her goal today. Pt stated she talked with her doctor and with her social worker about her care today. Pt rated her overall day a 0 out of 10. Pt stated she feels like she is been held here against her will. Pt stated she was able to contact  her boyfriend today which improved her overall day. Pt stated she felt better about herself tonight. Pt stated staff brought back all her meals today because she is on unit restriction. Pt stated she took all medications provided today. Pt stated her appetite was pretty good today. Pt rated her sleep last night was pretty good. Pt stated the goal tonight was to get some rest. Pt stated she had no physical pain tonight. Pt deny visual hallucinations and auditory issues tonight. Pt denies thoughts of harming herself or others. Pt stated she would alert staff if anything changed.  Candy Sledge 12/12/2022, 11:18 PM

## 2022-12-12 NOTE — Progress Notes (Signed)
   12/12/22 4665  15 Minute Checks  Location Hallway  Visual Appearance Calm  Behavior Composed  Sleep (Behavioral Health Patients Only)  Calculate sleep? (Click Yes once per 24 hr at 0600 safety check) Yes  Documented sleep last 24 hours 13
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
## 2022-12-12 NOTE — Progress Notes (Signed)
[facility] MD Progress Note  12/12/2022 12:43 PM Erika Rhodes  MRN:  [id number] Subjective:  Erika Rhodes is a 42 y.o. female with PMH of BiPD1, benzo-induced psychosis, benzo use d/o, cannabis use d/o, BoPD, multiple suicide attempts, multiple inpatient psych admission, DM2, uncontrolled, who presented to voluntary to [place] (12/09/2022) for a psychosis, mania, HI (towards partner), SI five days after starting a prednisone taper 6 days prior, then transferred Involuntary (EDP) to [facility] (12/10/2022) for stabilization of acute mania and psychosis.   Chart Review from last 24 hours:  Since admission the patient has been extremely agitated.  She refused all medications and nonemergency force medications were initiated yesterday.  Staff reports that she was compliant with her insulin medications and received an additional Invega 6 mg at bedtime last night.  She also took some as needed medications including diazepam 5 mg for agitation and anxiety.  She slept through the night and in fact was noted to be slightly sedated this morning.  According to staff the patient was a lot more calmer, and less agitated.  Objective: When seen today the patient was lying in bed, somewhat sedated but easily arousable.  She maintained fair eye contact.  Speech was labile but not very rapid and was significant for some tangentiality.  She claimed that her life was " a bowl of cherries" and admits that that was "sarcasm".  She claims that she is at her "baseline but unhappy".  She denied any SI/HI/AVH but remains labile and irritable.  She denied any delusions today.  She is still focused on not taking her medications voluntarily but has accepted p.o. medications last night.  She appears to be cognitively intact.  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  Past Medical History:  Past Medical History:  Diagnosis Date   Allergic reaction    Anxiety    Asthma    Benzodiazepine-induced psychosis, 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[place]) 123456   Cellulitis 09/08/2022   Depression    DM (diabetes mellitus), gestational    Facial cellulitis 09/09/2022   Gestational diabetes mellitus in pregnancy, unspecified control 09/01/2014   Formatting of this note might be different from the original.  A2GDM on Insulin   Pregnancy regimen:   Date started Early Pregnancy; Dose N-70 units QAM, N 38 units QHS; Humalog 18 units premeal     Baseline A1C (if diagnosed < 24 weeks) 8.1% on 08/2014  Baseline HELLP panel and upc (if diagnosed < than 24 weeks):  reviewed and normal  Growth ultrasound at 30-32 weeks reviewed and notable for [facility] 9   History of cesarean section 05/24/2014   Formatting of this note might be different from the original.  Overview:   C/s with g1, two term VBACs following c/s  Desires TOLAC  Previous CD times 1 for failure to progress.   Operative report(s): unavailable.  Options for mode of delivery discussed with patient and patient: desires TOLAC.    TOLAC consents signed: yes. Signed today 1/28   Intentional overdose ([place]) 09/01/2021   Migraines    PTSD (post-traumatic stress disorder) 01/12/2021   Rheumatoid  arthritis [facility]) 2016   Diagnosed Dr.  Lamarr Lulas; has been treated with MTX, Humira, etc, but made her sick.  Was taking CBD oil and stopped as tested positive in a drug screen for work.   Sepsis due to cellulitis ([place]) 09/09/2022   Steroid-induced psychosis with complication ([place]) 123456    Past Surgical History:  Procedure Laterality Date   ABDOMINAL HYSTERECTOMY   2017   Fibroids; Both ovaries intact still   BACK SURGERY     CESAREAN SECTION  2009   CHOLECYSTECTOMY  2010   laparoscopic   TONSILLECTOMY     Family History:  Family History  Problem Relation Age of Onset   Arthritis Mother        Rheumatoid   Depression Daughter        and anxiety   Allergies Son    Family Psychiatric  History: Please see H&P. Social History:  Social History   Substance and Sexual Activity  Alcohol Use Yes   Alcohol/week: 2.0 standard drinks of alcohol   Types: 2 Glasses of wine per week   Comment: occ     Social History   Substance and Sexual Activity  Drug Use Yes   Types: Marijuana   Comment: "cannabis delta 10"    Social History   Socioeconomic History   Marital status: Single    Spouse name: Not on file   Number of children: 5   Years of education: 14   Highest education level: Associate degree: academic program  Occupational History   Not on file  Tobacco Use   Smoking status: Never   Smokeless tobacco: Never  Vaping Use   Vaping Use: Some days  Substance and Sexual Activity   Alcohol use: Yes    Alcohol/week: 2.0 standard drinks of alcohol    Types: 2 Glasses of wine per week    Comment: occ   Drug use: Yes    Types: Marijuana    Comment: "cannabis delta 10"   Sexual activity: Yes    Birth control/protection: Surgical  Other Topics Concern   Not on file  Social History Narrative   Lives with current boyfriend   See history of present illness for more history 04/22/2020   Social Determinants of Health   Financial Resource Strain: Not on file  Food Insecurity: Not on file  Transportation Needs: Not on file  Physical Activity: Not on file  Stress: Not on file  Social Connections: Not on file   Additional Social History:                         Sleep: Good, slept 13 hrs.  Appetite:  Fair  Current Medications: Current Facility-Administered Medications  Medication Dose Route Frequency Provider Last Rate  Last Admin   acetaminophen (TYLENOL) tablet 650 mg  650 mg Oral Q6H PRN Charmaine Downs C, NP   650 mg at 12/10/22 2129   albuterol (VENTOLIN HFA) 108 (90 Base) MCG/ACT inhaler 2 puff  2 puff Inhalation Q4H PRN Charmaine Downs C, NP   2 puff at 12/12/22 0628   alum & mag hydroxide-simeth (MAALOX/MYLANTA) 200-200-20 MG/5ML suspension 30 mL  30 mL Oral Q4H PRN Charmaine Downs C, NP       chlorproMAZINE (THORAZINE) injection 50 mg  50 mg Intramuscular TID PRN Merrily Brittle, DO   50 mg at 12/11/22 1322   paliperidone (INVEGA) 24 hr tablet 6 mg  6 mg Oral QHS Merrily Brittle, DO   6  mg at 12/11/22 2058   Or   chlorproMAZINE (THORAZINE) injection 50 mg  50 mg Intramuscular QHS Merrily Brittle, DO       chlorproMAZINE (THORAZINE) tablet 50 mg  50 mg Oral TID PRN Merrily Brittle, DO       diazepam (VALIUM) tablet 5 mg  5 mg Oral Q12H PRN Janine Limbo, MD   5 mg at 12/12/22 1103   insulin aspart (novoLOG) injection 0-15 Units  0-15 Units Subcutaneous TID WC Merrily Brittle, DO   8 Units at 12/12/22 [id number]   insulin detemir (LEVEMIR) injection 15 Units  15 Units Subcutaneous QHS Merrily Brittle, DO   15 Units at 12/11/22 2106   LORazepam (ATIVAN) tablet 2 mg  2 mg Oral Q6H PRN Massengill, Ovid Curd, MD       Or   LORazepam (ATIVAN) injection 2 mg  2 mg Intramuscular Q6H PRN Janine Limbo, MD   2 mg at 12/11/22 1323   LORazepam (ATIVAN) tablet 1 mg  1 mg Oral PRN Merrily Brittle, DO       And   ziprasidone (GEODON) injection 20 mg  20 mg Intramuscular PRN Merrily Brittle, DO       magnesium hydroxide (MILK OF MAGNESIA) suspension 30 mL  30 mL Oral Daily PRN Charmaine Downs C, NP       metFORMIN (GLUCOPHAGE-XR) 24 hr tablet 500 mg  500 mg Oral BID WC Merrily Brittle, DO   500 mg at 12/12/22 1101   oxymetazoline (AFRIN) 0.05 % nasal spray 1 spray  1 spray Each Nare BID PRN Charmaine Downs C, NP   1 spray at 12/12/22 0628   [START ON 12/14/2022] predniSONE (DELTASONE) tablet 10 mg  10 mg Oral Q breakfast  Merrily Brittle, DO       [START ON 12/13/2022] predniSONE (DELTASONE) tablet 20 mg  20 mg Oral Q breakfast Merrily Brittle, DO        Lab Results:  Results for orders placed or performed during the hospital encounter of 12/10/22 (from the past 48 hour(s))  Glucose, capillary     Status: Abnormal   Collection Time: 12/10/22  9:00 PM  Result Value Ref Range   Glucose-Capillary 289 (H) 70 - 99 mg/dL    Comment: Glucose reference range applies only to samples taken after fasting for at least 8 hours.   Comment 1 Notify RN    Comment 2 Document in Chart   Glucose, capillary     Status: Abnormal   Collection Time: 12/11/22  5:49 AM  Result Value Ref Range   Glucose-Capillary 206 (H) 70 - 99 mg/dL    Comment: Glucose reference range applies only to samples taken after fasting for at least 8 hours.   Comment 1 Notify RN    Comment 2 Document in Chart   Glucose, capillary     Status: Abnormal   Collection Time: 12/11/22 12:09 PM  Result Value Ref Range   Glucose-Capillary 422 (H) 70 - 99 mg/dL    Comment: Glucose reference range applies only to samples taken after fasting for at least 8 hours.  Glucose, capillary     Status: Abnormal   Collection Time: 12/11/22  5:08 PM  Result Value Ref Range   Glucose-Capillary 348 (H) 70 - 99 mg/dL    Comment: Glucose reference range applies only to samples taken after fasting for at least 8 hours.   Comment 1 Notify RN   Glucose, capillary     Status: Abnormal   Collection Time: 12/11/22  7:56 PM  Result Value Ref Range   Glucose-Capillary 453 (H) 70 - 99 mg/dL    Comment: Glucose reference range applies only to samples taken after fasting for at least 8 hours.  Glucose, capillary     Status: Abnormal   Collection Time: 12/11/22 10:05 PM  Result Value Ref Range   Glucose-Capillary 307 (H) 70 - 99 mg/dL    Comment: Glucose reference range applies only to samples taken after fasting for at least 8 hours.  Glucose, capillary     Status: Abnormal    Collection Time: 12/12/22  5:59 AM  Result Value Ref Range   Glucose-Capillary 252 (H) 70 - 99 mg/dL    Comment: Glucose reference range applies only to samples taken after fasting for at least 8 hours.  TSH     Status: None   Collection Time: 12/12/22  6:33 AM  Result Value Ref Range   TSH 3.588 0.350 - 4.500 uIU/mL    Comment: Performed by a 3rd Generation assay with a functional sensitivity of <=0.01 uIU/mL. Performed at [facility], [place] [address] [place], [place] [address]   Lipid panel     Status: Abnormal   Collection Time: 12/12/22  6:33 AM  Result Value Ref Range   Cholesterol 229 (H) 0 - 200 mg/dL   Triglycerides 213 (H) <150 mg/dL   HDL 52 >40 mg/dL   Total CHOL/HDL Ratio 4.4 RATIO   VLDL 43 (H) 0 - 40 mg/dL   LDL Cholesterol 134 (H) 0 - 99 mg/dL    Comment:        Total Cholesterol/HDL:CHD Risk Coronary Heart Disease Risk Table                     Men   Women  1/2 Average Risk   3.4   3.3  Average Risk       5.0   4.4  2 X Average Risk   9.6   7.1  3 X Average Risk  23.4   11.0        Use the calculated Patient Ratio above and the CHD Risk Table to determine the patient's CHD Risk.        ATP III CLASSIFICATION (LDL):  <100     mg/dL   Optimal  100-129  mg/dL   Near or Above                    Optimal  130-159  mg/dL   Borderline  160-189  mg/dL   High  >190     mg/dL   Very High Performed at [facility] [address], [place], [place] [address]   Glucose, capillary     Status: Abnormal   Collection Time: 12/12/22 11:53 AM  Result Value Ref Range   Glucose-Capillary 439 (H) 70 - 99 mg/dL    Comment: Glucose reference range applies only to samples taken after fasting for at least 8 hours.    Blood Alcohol level:  Lab Results  Component Value Date   ETH <10 12/09/2022   ETH <10 36/14/4315    Metabolic Disorder Labs: Lab Results  Component Value Date   HGBA1C 10.7 (H) 12/09/2022   MPG 260.39 12/09/2022    MPG 191.51 09/09/2022   No results found for: "PROLACTIN" Lab Results  Component Value Date   CHOL 229 (H) 12/12/2022   TRIG 213 (H) 12/12/2022   HDL 52 12/12/2022   CHOLHDL 4.4  12/12/2022   VLDL 43 (H) 12/12/2022   LDLCALC 134 (H) 12/12/2022   LDLCALC 126 (H) 12/26/2021    Physical Findings: AIMS: Facial and Oral Movements Muscles of Facial Expression: None, normal Lips and Perioral Area: None, normal Jaw: None, normal Tongue: None, normal,Extremity Movements Upper (arms, wrists, hands, fingers): None, normal Lower (legs, knees, ankles, toes): None, normal, Trunk Movements Neck, shoulders, hips: None, normal, Overall Severity Severity of abnormal movements (highest score from questions above): None, normal Incapacitation due to abnormal movements: None, normal Patient's awareness of abnormal movements (rate only patient's report): No Awareness, Dental Status Current problems with teeth and/or dentures?: No Does patient usually wear dentures?: No  CIWA:    COWS:     Musculoskeletal: Strength & Muscle Tone: within normal limits Gait & Station: normal Patient leans: N/A  Psychiatric Specialty Exam:  Presentation  General Appearance:  Disheveled  Eye Contact: Fair  Speech: Garbled  Speech Volume: Normal  Handedness: Right   Mood and Affect  Mood: Irritable; Labile  Affect: Inappropriate; Non-Congruent   Thought Process  Thought Processes: Disorganized  Descriptions of Associations:Tangential  Orientation:Full (Time, Place and Person)  Thought Content:Perseveration; Illogical  History of Schizophrenia/Schizoaffective disorder:No  Duration of Psychotic Symptoms:N/A  Hallucinations:Hallucinations: None  Ideas of Reference:None  Suicidal Thoughts:Suicidal Thoughts: No  Homicidal Thoughts:Homicidal Thoughts: No   Sensorium  Memory: Immediate Fair; Recent Fair; Other (comment) (Unable to assess because of her lack of  cooperation.)  Judgment: Poor  Insight: Poor   Executive Functions  Concentration: Fair  Attention Span: Fair  Recall: [employer] of Knowledge: Fair  Language: Fair   Psychomotor Activity  Psychomotor Activity: Psychomotor Activity: Normal   Assets  Assets: Armed forces logistics/support/administrative officer; Desire for Improvement   Sleep  Sleep: Sleep: Good Number of Hours of Sleep: 13    Physical Exam: Physical Exam ROS Blood pressure (!) 103/59, pulse (!) 118, temperature 98.1 F (36.7 C), temperature source Oral, resp. rate 16, height 5\' 3"  (1.6 m), weight 79.8 kg, SpO2 98 %. Body mass index is 31.18 kg/m.   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)   Uncontrolled type 2 diabetes mellitus with hyperglycemia, without long-term current use of insulin (HCC)     PLAN: Safety and Monitoring:             -- Involuntary admission to inpatient psychiatric unit for safety, stabilization and treatment - IVC'd by EDP. 2nd exam completed 12/11/2022, exp 12/18/2022             -- Daily contact with patient to assess and evaluate symptoms and progress in treatment             -- Patient's case to be discussed in multi-disciplinary team meeting             -- Observation Level : q15 minute checks             -- Vital signs:  q12 hours             -- Precautions: suicide, elopement, and assault             -- Forced Med Protocol started 12/11/2022, exp 12/18/2022  PO invega 6 mg qHS, if refuses thorazine 50 mg IM             -- Unit restriction, started 12/11/2022   2. Psychiatric Diagnoses and Treatment:     Steroid induced psychosis/mania  BiPD1  BoPD  Sxs of agitation, delusion, mania. Required multiple agitation PRNs. Suspect in part 2/2 to prednisone, started on prednisone taper on 12/04/2022, [facility] to [facility] on 12/09/2022 manic and delusional. Likely multifactorial as UDS + bzo and thc. At [facility] initially on Zyprexa 5 mg qHS, DC'd because patient refusing.  STARTED Invega 6 mg qHS p.o. Thorazine 50 mg IM if patient refuses DISCONTINUED Zyprexa 5 mg qHS p.o. Prednisone taper per below STARTED agitation PRN   Chronic BZO misuse Per PDMP, filled Diazepam 5 Mg x60 tabs for 30 days on 11/26/2022.Positive on UDS Continued home valium 5 mg q12hours   Cannabis use d/o Positive on UDS.  Encouraged cessation   -- The risks/benefits/side-effects/alternatives to this medication were discussed in detail with the patient and time was given for questions. The patient consents to medication trial.              -- Metabolic profile and EKG monitoring obtained while on an atypical antipsychotic              -- Encouraged patient to participate in unit milieu and in scheduled group therapies              -- Short Term Goals:  Ability to identify changes in lifestyle to reduce recurrence of condition will improve, Ability to verbalize feelings will improve, Ability to disclose and discuss suicidal ideas, Ability to demonstrate self-control will improve, Ability to identify and develop effective coping behaviors will improve, Ability to maintain clinical measurements within normal limits will improve, Compliance with prescribed medications will improve, and Ability to identify triggers associated with substance abuse/mental health issues will improve             -- Long Term Goals: Improvement in symptoms so as ready for discharge              3. Medical Issues Being Addressed:    Prednisone taper Started 10/05/2023. For self reported RA flare. Suspected prednisone misuse due  Prednisone 60 mg breakfast for  6 days- completed 12/09/2022 Prednisone 40 mg daily with breakfast - completed 12/11/2022 Prednisone 30 mg daily with breakfast - last dose 12/12/2022 Prednisone 20 mg daily with breakfast - last dose 12/13/2022 Prednisone 10 mg daily with breakfast - last dose 12/14/2022   DM2, uncontrolled A1c 10.7 (12/09/2022). CBGs ~200s-400s (IP CBG goal <180).  CGBs q4hrs & mSSI  Diabetic coordinator consulted INCREASED long acting insulin Levemir 10u to 15u qHS CHANGED metformin 500 mg BID to metformin XR 500 mg BID, with plans to titrate up to 1000 mg BID   Mild hypokalemia K+ 3.2 Klor-Con 20 mEq x 2 doses-COMPLETED   #Labs Reviewed/Pending  WLED labs: tylenol level <10, Na 127, Cl91, CO2 21, CBG 564, Ca 8.8, WBC 22.3, RBC 5.2, Hb 16, UDS+bzo, thc.  BAL <10.  UA no UTI.  Pregnancy test negative.  A1c 10.7.   Labs this encounter: Na+ 134, glucose 207, c Na+ 137.  K+ 3.2.  Calcium 8.7.  WBC 13.4, neutrophil count 7.8, lymphocyte count 4.8, CBC within normal limits otherwise. EKG Sinus rhythm, QTc 455   TSH, Lipid panel, repeat EKG - ordered for 12/12/2022     4. Discharge Planning:  -- Social work and case  management to assist with discharge planning and identification of hospital follow-up needs prior to discharge             -- Estimated LOS: 5-7 days             -- Discharge Concerns: Need to establish a safety plan; Medication compliance and effectiveness             -- Discharge Goals: Return home with outpatient referrals for mental health follow-up including medication management/psychotherapy   I certify that inpatient services furnished can reasonably be expected to improve the patient's condition.     Ranae Palms, MD 12/12/2022, 12:43 PM Total Time Spent in Direct Patient Care:  I personally spent 20 minutes on the unit in direct patient care. The direct patient care time included face-to-face time with the patient, reviewing the patient's chart, communicating with other professionals, and  coordinating care. Greater than 50% of this time was spent in counseling or coordinating care with the patient regarding goals of hospitalization, psycho-education, and discharge planning needs.   [facility]

## 2022-12-12 NOTE — Progress Notes (Signed)
Pt CBG was rechecked post prandial and is currently 560 mg /dl at 1906. Dr. Renard Hamper made aware. Pt's sliding scale changed to 0-20 units with night time coverage. Metformin increased as well to 750 mg BID with order to recheck at HS. Pt remains asymptomatic. A & O, verbal, pacing in unit and is coherent with linear conversation with staff. Safety checks maintained. Oncoming shift notified of changes via shift report.

## 2022-12-13 DIAGNOSIS — F319 Bipolar disorder, unspecified: Secondary | ICD-10-CM

## 2022-12-13 LAB — GLUCOSE, CAPILLARY
Glucose-Capillary: 229 mg/dL — ABNORMAL HIGH (ref 70–99)
Glucose-Capillary: 511 mg/dL (ref 70–99)
Glucose-Capillary: 324 mg/dL — ABNORMAL HIGH (ref 70–99)
Glucose-Capillary: 296 mg/dL — ABNORMAL HIGH (ref 70–99)

## 2022-12-13 MED ORDER — GABAPENTIN 300 MG PO CAPS
300.0000 mg | ORAL_CAPSULE | Freq: Three times a day (TID) | ORAL | Status: DC
  Administered 2022-12-13 – 2022-12-15 (×5): 300 mg via ORAL
  Filled 2022-12-13 (×9): qty 1

## 2022-12-13 NOTE — Progress Notes (Signed)
   12/13/22 0926  Psych Admission Type (Psych Patients Only)  Admission Status Involuntary  Psychosocial Assessment  Patient Complaints Anxiety;Agitation  Eye Contact Fair  Facial Expression Anxious;Blank  Affect Appropriate to circumstance  Speech Pressured;Argumentative  Interaction Needy;Defensive;Demanding  Motor Activity Fidgety;Restless  Appearance/Hygiene Improved  Behavior Characteristics Impulsive;Appropriate to situation  Mood Labile  Aggressive Behavior  Effect No apparent injury  Thought Process  Coherency Circumstantial  Content Blaming others  Delusions Paranoid;Persecutory  Perception Hallucinations  Hallucination Auditory  Judgment Poor  Confusion None  Danger to Self  Current suicidal ideation? Denies  Self-Injurious Behavior No self-injurious ideation or behavior indicators observed or expressed   Agreement Not to Harm Self Yes  Description of Agreement Verbal Contract  Danger to Others  Danger to Others None reported or observed   Pt A & O X3. Denies SI, HI and AVH. Presents with congruent affect, logical speech, fair eye contact and has been more cooperative with care, verbal redirections despite still being argumentative, defensive. She's been less oppositional this shift. Attended scheduled groups, engaged appropriately in discussion. Remains medication compliant. Started on Gabapentin 300 mg TID for neuropathic pain. Cooperative with CBG monitoring and needs frequent reminders to adhere to her diabetic diet. Safety checks maintained at Q 15 minutes intervals without an outburst thus far. Support, reassurance and encouragement offered to pt. Safety maintained in milieu.

## 2022-12-13 NOTE — Progress Notes (Signed)
   12/13/22 0000  Psych Admission Type (Psych Patients Only)  Admission Status Involuntary  Psychosocial Assessment  Patient Complaints Anxiety;Worrying  Eye Contact Intense  Facial Expression Anxious  Affect Appropriate to circumstance  Speech Argumentative;Pressured  Interaction Assertive  Motor Activity Fidgety  Appearance/Hygiene Improved  Behavior Characteristics Cooperative;Appropriate to situation  Mood Anxious  Thought Process  Coherency Tangential;Disorganized  Content Blaming others  Delusions Paranoid  Perception Hallucinations  Hallucination Auditory  Judgment Poor  Confusion None  Danger to Self  Current suicidal ideation? Denies  Self-Injurious Behavior No self-injurious ideation or behavior indicators observed or expressed   Agreement Not to Harm Self Yes  Description of Agreement verbal  Danger to Others  Danger to Others None reported or observed

## 2022-12-13 NOTE — Progress Notes (Signed)
Adult Psychoeducational Group Note  Date:  12/13/2022 Time:  10:43 PM  Group Topic/Focus:  Wrap-Up Group:   The focus of this group is to help patients review their daily goal of treatment and discuss progress on daily workbooks.  Participation Level:  Active  Participation Quality:  Appropriate  Affect:  Anxious and Irritable  Cognitive:  Disorganized and Confused  Insight: Limited  Engagement in Group:  Limited  Modes of Intervention:  Discussion  Additional Comments:   Pt stated her goal for today was to focus on her treatment plan. Pt stated she accomplished her goal today. Pt stated she talked with her doctor and with her social worker about her care today. Pt rated her overall day a 0 out of 10. Pt stated she is fustrated and does not know why she is her. Pt stated know one is answering her question.  Pt stated she feels like she is been held here against her will. Pt stated she was able to contact  her boyfriend today which improved her overall day. Pt stated she felt better about herself tonight. Pt stated staff brought back all her meals today because she is on unit restriction. Pt stated she took all medications provided today. Pt stated her appetite was poor today. Pt rated her sleep last night was pretty good. Pt stated the goal tonight was to get some rest. Pt stated she had some physical pain tonight. Pt stated she had severe pain all over her body tonight. Pt rated the severe pain and 8 on the pain level scale. Pt nurse was updated on the situation. Pt deny visual hallucinations and auditory issues tonight. Pt denies thoughts of harming herself or others. Pt stated she would alert staff if anything changed.   Candy Sledge 12/13/2022, 10:43 PM

## 2022-12-13 NOTE — Progress Notes (Signed)
[facility] MD Progress Note  12/13/2022 11:44 AM Sherriann Szuch  MRN:  [id number] Subjective:  Shadawn Hanaway is a 42 y.o. female with PMH of BiPD1, benzo-induced psychosis, benzo use d/o, cannabis use d/o, BoPD, multiple suicide attempts, multiple inpatient psych admission, DM2, uncontrolled, who presented to voluntary to [facility] (12/09/2022) for a psychosis, mania, HI (towards partner), SI five days after starting a prednisone taper 6 days prior, then transferred Involuntary (EDP) to [facility] (12/10/2022) for stabilization of acute mania and psychosis.   Chart Review from last 24 hours:  The patient was seen today and the chart was reviewed and the case was discussed with nursing staff.  Patient's blood sugars have been extremely high and patient has been noncompliant with her diet but has been taking the medications as prescribed including the sliding scale insulin.  Last night her blood sugars fluctuated from a high of 580-363.  She received sliding scale insulin and her metformin was increased to 750 twice daily.  This morning it is down to 229.  Patient remains noncompliant with her diet.  She also remains irritable but is less agitated. Objective: When seen today the patient was lying in bed, and appeared disheveled and somewhat frustrated.  She maintained poor eye contact.  Her speech was of low volume with some latency but no obvious looseness of associations although she remains somewhat labile and tangential. She states" I am generally disappointed with the healthcare system and them making me take medications." She denies any active SI/HI/AVH but was noted to be talking to herself periodically and claims that she does not have the routine.  She is easily agitated and oppositional and argumentative but in general is compliant with medications.  She has been voluntarily accepting the Invega 6 mg a day.  Cognitively she appears to be intact.  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  Past Medical History:  Past Medical History:  Diagnosis Date   Allergic reaction    Anxiety    Asthma    Benzodiazepine-induced psychosis, 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[place]) 28/78/6767   Cellulitis 09/08/2022   Depression    DM (diabetes mellitus), gestational    Facial cellulitis 09/09/2022   Gestational diabetes mellitus in pregnancy, unspecified control 09/01/2014   Formatting of this note might be different from the original.  A2GDM on Insulin   Pregnancy regimen:   Date started Early Pregnancy; Dose N-70 units QAM, N 38 units QHS; Humalog 18 units premeal     Baseline A1C (if diagnosed < 24 weeks) 8.1% on 08/2014  Baseline HELLP panel and upc (if diagnosed < than 24 weeks):  reviewed and normal  Growth ultrasound at 30-32 weeks reviewed and notable for [facility] 9   History of cesarean section 05/24/2014   Formatting of this note might be different from the original.  Overview:   C/s with g1, two term VBACs following c/s  Desires TOLAC  Previous CD times 1 for failure to progress.   Operative report(s): unavailable.  Options for mode of delivery discussed with patient and patient: desires TOLAC.    TOLAC consents signed: yes. Signed  today 1/28   Intentional overdose (HCC) 09/01/2021   Migraines    PTSD (post-traumatic stress disorder) 01/12/2021   Rheumatoid arthritis (HCC) 2016   Diagnosed Dr.  Patsi Sears; has been treated with MTX, Humira, etc, but made her sick.  Was taking CBD oil and stopped as tested positive in a drug screen for work.   Sepsis due to cellulitis (HCC)  09/09/2022   Steroid-induced psychosis with complication (HCC) 12/11/2022    Past Surgical History:  Procedure Laterality Date   ABDOMINAL HYSTERECTOMY  2017   Fibroids; Both ovaries intact still   BACK SURGERY     CESAREAN SECTION  2009   CHOLECYSTECTOMY  2010   laparoscopic   TONSILLECTOMY     Family History:  Family History  Problem Relation Age of Onset   Arthritis Mother        Rheumatoid   Depression Daughter        and anxiety   Allergies Son    Family Psychiatric  History: Please see H&P. Social History:  Social History   Substance and Sexual Activity  Alcohol Use Yes   Alcohol/week: 2.0 standard drinks of alcohol   Types: 2 Glasses of wine per week   Comment: occ     Social History   Substance and Sexual Activity  Drug Use Yes   Types: Marijuana   Comment: "cannabis delta 10"    Social History   Socioeconomic History   Marital status: Single    Spouse name: Not on file   Number of children: 5   Years of education: 14   Highest education level: Associate degree: academic program  Occupational History   Not on file  Tobacco Use   Smoking status: Never   Smokeless tobacco: Never  Vaping Use   Vaping Use: Some days  Substance and Sexual Activity   Alcohol use: Yes    Alcohol/week: 2.0 standard drinks of alcohol    Types: 2 Glasses of wine per week    Comment: occ   Drug use: Yes    Types: Marijuana    Comment: "cannabis delta 10"   Sexual activity: Yes    Birth control/protection: Surgical  Other Topics Concern   Not on file  Social History Narrative   Lives with current boyfriend   See history of present illness for more history 04/22/2020   Social Determinants of Health   Financial Resource Strain: Not on file  Food Insecurity: Not on file  Transportation Needs: Not on file  Physical Activity: Not on file  Stress: Not on file  Social Connections: Not on file   Additional Social History:                         Sleep:  Good, slept 13 hrs.  Appetite:  Fair  Current Medications: Current Facility-Administered Medications  Medication Dose Route Frequency Provider Last Rate Last Admin   acetaminophen (TYLENOL) tablet 650 mg  650 mg Oral Q6H PRN Dahlia Byes C, NP   650 mg at 12/10/22 2129   albuterol (VENTOLIN HFA) 108 (90 Base) MCG/ACT inhaler 2 puff  2 puff Inhalation Q4H PRN Dahlia Byes C, NP   2 puff at 12/13/22 0718   alum & mag hydroxide-simeth (MAALOX/MYLANTA) 200-200-20 MG/5ML suspension 30 mL  30 mL Oral Q4H PRN Dahlia Byes C, NP       chlorproMAZINE (THORAZINE) injection 50 mg  50 mg Intramuscular TID PRN Princess Bruins, DO   50 mg at  12/11/22 1322   paliperidone (INVEGA) 24 hr tablet 6 mg  6 mg Oral QHS Princess BruinsNguyen, Julie, DO   6 mg at 12/12/22 2125   Or   chlorproMAZINE (THORAZINE) injection 50 mg  50 mg Intramuscular QHS Princess BruinsNguyen, Julie, DO       chlorproMAZINE (THORAZINE) tablet 50 mg  50 mg Oral TID PRN Princess BruinsNguyen, Julie, DO   50 mg at 12/12/22 2106   diazepam (VALIUM) tablet 5 mg  5 mg Oral Q12H PRN Phineas InchesMassengill, Nathan, MD   5 mg at 12/13/22 0452   ibuprofen (ADVIL) tablet 400 mg  400 mg Oral TID Rex KrasGoli, Danikah Budzik, MD   400 mg at 12/13/22 0847   insulin aspart (novoLOG) injection 0-20 Units  0-20 Units Subcutaneous TID WC Starleen BlueNkwenti, Doris, NP   7 Units at 12/13/22 0716   insulin aspart (novoLOG) injection 0-5 Units  0-5 Units Subcutaneous QHS Starleen BlueNkwenti, Doris, NP   5 Units at 12/12/22 2114   insulin aspart (novoLOG) injection 6 Units  6 Units Subcutaneous TID WC Starleen BlueNkwenti, Doris, NP   6 Units at 12/13/22 0715   insulin detemir (LEVEMIR) injection 15 Units  15 Units Subcutaneous QHS Princess BruinsNguyen, Julie, DO   15 Units at 12/12/22 2124   LORazepam (ATIVAN) tablet 2 mg  2 mg Oral Q6H PRN Phineas InchesMassengill, Nathan, MD   2 mg at 12/12/22 2108   Or   LORazepam (ATIVAN) injection 2 mg  2 mg Intramuscular Q6H PRN Phineas InchesMassengill, Nathan, MD   2 mg at 12/11/22 1323   LORazepam (ATIVAN) tablet 1 mg  1 mg Oral PRN Princess BruinsNguyen, Julie,  DO       And   ziprasidone (GEODON) injection 20 mg  20 mg Intramuscular PRN Princess BruinsNguyen, Julie, DO       magnesium hydroxide (MILK OF MAGNESIA) suspension 30 mL  30 mL Oral Daily PRN Dahlia Byesnuoha, Josephine C, NP       metFORMIN (GLUCOPHAGE-XR) 24 hr tablet 750 mg  750 mg Oral BID WC Rex KrasGoli, Yukiko Minnich, MD   750 mg at 12/13/22 0856   nicotine polacrilex (NICORETTE) gum 2 mg  2 mg Oral PRN Rex KrasGoli, Jannie Doyle, MD   2 mg at 12/13/22 1049   oxymetazoline (AFRIN) 0.05 % nasal spray 1 spray  1 spray Each Nare BID PRN Dahlia Byesnuoha, Josephine C, NP   1 spray at 12/13/22 0408   [START ON 12/14/2022] predniSONE (DELTASONE) tablet 10 mg  10 mg Oral Q breakfast Princess BruinsNguyen, Julie, DO        Lab Results:  Results for orders placed or performed during the hospital encounter of 12/10/22 (from the past 48 hour(s))  Glucose, capillary     Status: Abnormal   Collection Time: 12/11/22 12:09 PM  Result Value Ref Range   Glucose-Capillary 422 (H) 70 - 99 mg/dL    Comment: Glucose reference range applies only to samples taken after fasting for at least 8 hours.  Glucose, capillary     Status: Abnormal   Collection Time: 12/11/22  5:08 PM  Result Value Ref Range   Glucose-Capillary 348 (H) 70 - 99 mg/dL    Comment: Glucose reference range applies only to samples taken after fasting for at least 8 hours.   Comment 1 Notify RN   Glucose, capillary     Status: Abnormal   Collection Time: 12/11/22  7:56 PM  Result Value Ref Range   Glucose-Capillary 453 (H) 70 - 99 mg/dL    Comment: Glucose reference range applies only to samples taken after fasting for at  least 8 hours.  Glucose, capillary     Status: Abnormal   Collection Time: 12/11/22 10:05 PM  Result Value Ref Range   Glucose-Capillary 307 (H) 70 - 99 mg/dL    Comment: Glucose reference range applies only to samples taken after fasting for at least 8 hours.  Glucose, capillary     Status: Abnormal   Collection Time: 12/12/22  5:59 AM  Result Value Ref Range   Glucose-Capillary  252 (H) 70 - 99 mg/dL    Comment: Glucose reference range applies only to samples taken after fasting for at least 8 hours.  TSH     Status: None   Collection Time: 12/12/22  6:33 AM  Result Value Ref Range   TSH 3.588 0.350 - 4.500 uIU/mL    Comment: Performed by a 3rd Generation assay with a functional sensitivity of <=0.01 uIU/mL. Performed at [facility], [place] [address] [place], [place] [address]   Lipid panel     Status: Abnormal   Collection Time: 12/12/22  6:33 AM  Result Value Ref Range   Cholesterol 229 (H) 0 - 200 mg/dL   Triglycerides 213 (H) <150 mg/dL   HDL 52 >40 mg/dL   Total CHOL/HDL Ratio 4.4 RATIO   VLDL 43 (H) 0 - 40 mg/dL   LDL Cholesterol 134 (H) 0 - 99 mg/dL    Comment:        Total Cholesterol/HDL:CHD Risk Coronary Heart Disease Risk Table                     Men   Women  1/2 Average Risk   3.4   3.3  Average Risk       5.0   4.4  2 X Average Risk   9.6   7.1  3 X Average Risk  23.4   11.0        Use the calculated Patient Ratio above and the CHD Risk Table to determine the patient's CHD Risk.        ATP III CLASSIFICATION (LDL):  <100     mg/dL   Optimal  100-129  mg/dL   Near or Above                    Optimal  130-159  mg/dL   Borderline  160-189  mg/dL   High  >190     mg/dL   Very High Performed at [facility] [address], [place], [place] [address]   Glucose, capillary     Status: Abnormal   Collection Time: 12/12/22 11:53 AM  Result Value Ref Range   Glucose-Capillary 439 (H) 70 - 99 mg/dL    Comment: Glucose reference range applies only to samples taken after fasting for at least 8 hours.  Glucose, capillary     Status: Abnormal   Collection Time: 12/12/22  5:03 PM  Result Value Ref Range   Glucose-Capillary 580 (HH) 70 - 99 mg/dL    Comment: Glucose reference range applies only to samples taken after fasting for at least 8 hours.   Comment 1 Notify RN   Glucose, capillary     Status:  Abnormal   Collection Time: 12/12/22  7:06 PM  Result Value Ref Range   Glucose-Capillary 560 (HH) 70 - 99 mg/dL    Comment: Glucose reference range applies only to samples taken after fasting for at least 8 hours.   Comment 1 Notify RN   Glucose, capillary  Status: Abnormal   Collection Time: 12/12/22  8:52 PM  Result Value Ref Range   Glucose-Capillary 363 (H) 70 - 99 mg/dL    Comment: Glucose reference range applies only to samples taken after fasting for at least 8 hours.  Glucose, capillary     Status: Abnormal   Collection Time: 12/13/22  6:16 AM  Result Value Ref Range   Glucose-Capillary 229 (H) 70 - 99 mg/dL    Comment: Glucose reference range applies only to samples taken after fasting for at least 8 hours.    Blood Alcohol level:  Lab Results  Component Value Date   ETH <10 12/09/2022   ETH <10 12/24/2021    Metabolic Disorder Labs: Lab Results  Component Value Date   HGBA1C 10.7 (H) 12/09/2022   MPG 260.39 12/09/2022   MPG 191.51 09/09/2022   No results found for: "PROLACTIN" Lab Results  Component Value Date   CHOL 229 (H) 12/12/2022   TRIG 213 (H) 12/12/2022   HDL 52 12/12/2022   CHOLHDL 4.4 12/12/2022   VLDL 43 (H) 12/12/2022   LDLCALC 134 (H) 12/12/2022   LDLCALC 126 (H) 12/26/2021    Physical Findings: AIMS: Facial and Oral Movements Muscles of Facial Expression: None, normal Lips and Perioral Area: None, normal Jaw: None, normal Tongue: None, normal,Extremity Movements Upper (arms, wrists, hands, fingers): None, normal Lower (legs, knees, ankles, toes): None, normal, Trunk Movements Neck, shoulders, hips: None, normal, Overall Severity Severity of abnormal movements (highest score from questions above): None, normal Incapacitation due to abnormal movements: None, normal Patient's awareness of abnormal movements (rate only patient's report): No Awareness, Dental Status Current problems with teeth and/or dentures?: No Does patient usually  wear dentures?: No  CIWA:    COWS:     Musculoskeletal: Strength & Muscle Tone: within normal limits Gait & Station: normal Patient leans: N/A  Psychiatric Specialty Exam:  Presentation  General Appearance:  Disheveled  Eye Contact: Fair  Speech: Slow  Speech Volume: Decreased  Handedness: Right   Mood and Affect  Mood: Irritable  Affect: Constricted   Thought Process  Thought Processes: Coherent  Descriptions of Associations:Intact  Orientation:Full (Time, Place and Person)  Thought Content:Illogical; Perseveration; Rumination  History of Schizophrenia/Schizoaffective disorder:No  Duration of Psychotic Symptoms:N/A  Hallucinations:Hallucinations: None  Ideas of Reference:None  Suicidal Thoughts:Suicidal Thoughts: No  Homicidal Thoughts:Homicidal Thoughts: No   Sensorium  Memory: Immediate Fair; Remote Fair; Recent Fair  Judgment: Poor  Insight: Poor   Executive Functions  Concentration: Fair  Attention Span: Fair  Recall: [employer] of Knowledge: Fair  Language: Fair   Psychomotor Activity  Psychomotor Activity: Psychomotor Activity: Normal   Assets  Assets: Communication Skills; Social Support   Sleep  Sleep: Sleep: Fair Number of Hours of Sleep: 7.26    Physical Exam: Physical Exam ROS Blood pressure 105/75, pulse (!) 113, temperature 97.9 F (36.6 C), temperature source Oral, resp. rate 16, height 5\' 3"  (1.6 m), weight 79.8 kg, SpO2 98 %. Body mass index is 31.18 kg/m.   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 2nd exam completed 12/11/2022, exp 12/18/2022             -- Daily contact with patient to assess and evaluate symptoms and progress in treatment             -- Patient's case to be discussed in multi-disciplinary team meeting             -- Observation Level : q15 minute checks             -- Vital signs:  q12 hours             -- Precautions: suicide, elopement, and assault             -- Forced Med Protocol started 12/11/2022, exp 12/18/2022                         PO invega 6 mg qHS, if refuses thorazine 50 mg IM (patient is taking this voluntarily at this time.             -- Unit restriction, started 12/11/2022   2. Psychiatric Diagnoses and Treatment:    Steroid induced psychosis/mania  BiPD1  BoPD  Sxs of agitation, delusion, mania. Required multiple agitation PRNs. Suspect in part 2/2 to prednisone, started on prednisone taper on 12/04/2022, presented to [facility] on 12/09/2022 manic and delusional. Likely multifactorial as UDS + bzo and thc. At [facility] initially on Zyprexa 5 mg qHS, DC'd because patient refusing.  STARTED Invega 6 mg qHS p.o. Thorazine 50 mg IM if patient refuses DISCONTINUED Zyprexa 5 mg qHS p.o. Prednisone taper per below STARTED agitation PRN    Chronic BZO misuse Per PDMP, filled Diazepam 5 Mg x60 tabs for 30 days on 11/26/2022.Positive on UDS Continued home valium 5 mg q12hours   Cannabis use d/o Positive on UDS.  Encouraged cessation   -- The risks/benefits/side-effects/alternatives to this medication were discussed in detail with the patient and time was given for questions. The patient consents to medication trial.              -- Metabolic profile and EKG monitoring obtained while on an atypical antipsychotic              --  Encouraged patient to participate in unit milieu and in scheduled group therapies              -- Short Term Goals:  Ability to identify changes in lifestyle to reduce recurrence of condition will improve, Ability to verbalize feelings will improve, Ability to disclose and discuss suicidal ideas, Ability to demonstrate self-control will improve, Ability to identify and develop effective coping behaviors will improve, Ability to maintain clinical measurements within normal limits will improve, Compliance with prescribed medications will improve, and Ability to identify triggers associated with substance abuse/mental health issues will improve             -- Long Term Goals: Improvement in symptoms so as ready for discharge              3. Medical Issues Being Addressed:    Prednisone taper Started 10/05/2023. For self reported RA flare. Suspected prednisone misuse due  Prednisone 60 mg breakfast for 6 days- completed 12/09/2022 Prednisone 40 mg daily with breakfast - completed 12/11/2022 Prednisone 30  mg daily with breakfast - last dose 12/12/2022 Prednisone 20 mg daily with breakfast - last dose 12/13/2022 Prednisone 10 mg daily with breakfast - last dose 12/14/2022   DM2, uncontrolled A1c 10.7 (12/09/2022). CBGs ~200s-400s (IP CBG goal <180).  CGBs q4hrs & mSSI  Diabetic coordinator consulted INCREASED long acting insulin Levemir 10u to 15u qHS  Continue with diabetes care.  Patient's blood sugars are not stable and her sliding scale has been increased and metformin was increased to 750 mg p.o. twice daily. Mild hypokalemia K+ 3.2 Klor-Con 20 mEq x 2 doses-COMPLETED   #Labs Reviewed/Pending  WLED labs: tylenol level <10, Na 127, Cl91, CO2 21, CBG 564, Ca 8.8, WBC 22.3, RBC 5.2, Hb 16, UDS+bzo, thc.  BAL <10.  UA no UTI.  Pregnancy test negative.  A1c 10.7.   Labs this encounter: Na+ 134, glucose 207, c Na+ 137.  K+ 3.2.  Calcium 8.7.  WBC 13.4, neutrophil count 7.8, lymphocyte count 4.8, CBC  within normal limits otherwise. EKG Sinus rhythm, QTc 455   TSH, Lipid panel, repeat EKG - ordered for 12/12/2022     4. Discharge Planning:  -- Social work and case management to assist with discharge planning and identification of hospital follow-up needs prior to discharge             -- Estimated LOS: 5-7 days             -- Discharge Concerns: Need to establish a safety plan; Medication compliance and effectiveness             -- Discharge Goals: Return home with outpatient referrals for mental health follow-up including medication management/psychotherapy   I certify that inpatient services furnished can reasonably be expected to improve the patient's condition.     Rex Kras, MD 12/13/2022, 11:44 AM Total Time Spent in Direct Patient Care:  I personally spent 20 minutes on the unit in direct patient care. The direct patient care time included face-to-face time with the patient, reviewing the patient's chart, communicating with other professionals, and coordinating care. Greater than 50% of this time was spent in counseling or coordinating care with the patient regarding goals of hospitalization, psycho-education, and discharge planning needs.   Claudette Wermuth YYFR,TM,YTR,ZNBVAP Psychiatrist  Patient ID: Brailyn Killion, female   DOB: 11-12-1980, 42 y.o.   MRN: [id number]

## 2022-12-13 NOTE — Progress Notes (Signed)
   12/13/22 0601  15 Minute Checks  Location Bedroom  Visual Appearance Calm  Behavior Sleeping  Sleep (Behavioral Health Patients Only)  Calculate sleep? (Click Yes once per 24 hr at 0600 safety check) Yes  Documented sleep last 24 hours 7.25

## 2022-12-13 NOTE — [facility] Group Notes (Signed)
Adult Psychoeducational Group Note Date:  12/13/2022 Time:  0900-1000 Group Topic/Focus: PROGRESSIVE RELAXATION. A group where deep breathing is taught and tensing and relaxation muscle groups is used. Imagery is used as well.  Pts are asked to imagine 3 pillars that hold them up when they are not able to hold themselves up and to share that with the group.   Participation Level:  did not attend   : Wes Lezotte A   

## 2022-12-14 DIAGNOSIS — F19959 Other psychoactive substance use, unspecified with psychoactive substance-induced psychotic disorder, unspecified: Secondary | ICD-10-CM | POA: Diagnosis not present

## 2022-12-14 LAB — GLUCOSE, CAPILLARY
Glucose-Capillary: 230 mg/dL — ABNORMAL HIGH (ref 70–99)
Glucose-Capillary: 190 mg/dL — ABNORMAL HIGH (ref 70–99)
Glucose-Capillary: 380 mg/dL — ABNORMAL HIGH (ref 70–99)
Glucose-Capillary: 342 mg/dL — ABNORMAL HIGH (ref 70–99)

## 2022-12-14 MED ORDER — PALIPERIDONE ER 6 MG PO TB24
9.0000 mg | ORAL_TABLET | Freq: Every day | ORAL | Status: DC
  Administered 2022-12-14: 9 mg via ORAL
  Filled 2022-12-14 (×2): qty 1

## 2022-12-14 MED ORDER — CHLORPROMAZINE HCL 25 MG/ML IJ SOLN
50.0000 mg | Freq: Every day | INTRAMUSCULAR | Status: DC
  Administered 2022-12-14: 50 mg via INTRAMUSCULAR
  Filled 2022-12-14 (×2): qty 2

## 2022-12-14 NOTE — Progress Notes (Signed)
Pt was encouraged but refused to attend group discussion 

## 2022-12-14 NOTE — Progress Notes (Signed)
Pt present with agitated mood on the unit. Pt is very oppositional, argumentative with staff and disruptive in the milieu. Pt stated she is being treated like a slave, she is not allowed to eat what she want, she is does not care wether her blood sugar goes up. Pt is not vested in her treatment. Pt constantly asking for valium and ativan and will not follow redirection. Will continue to monitor.

## 2022-12-14 NOTE — Inpatient Diabetes Management (Signed)
Inpatient Diabetes Program Recommendations  AACE/ADA: New Consensus Statement on Inpatient Glycemic Control (2015)  Target Ranges:  Prepandial:   less than 140 mg/dL      Peak postprandial:   less than 180 mg/dL (1-2 hours)      Critically ill patients:  140 - 180 mg/dL   Lab Results  Component Value Date   GLUCAP 230 (H) 12/14/2022   HGBA1C 10.7 (H) 12/09/2022    Review of Glycemic Control  Blood sugar both fasting and post-prandials above goal of < 180 mg/dL. Steroids being tapered down - 10 mg QD now Current orders for Inpatient glycemic control: Levemir 15 QHS, Novolog 0-20 ac/hs + 6 units TID, metformin 750 mg BID  Inpatient Diabetes Program Recommendations:    Consider increasing Levemir to 35 units QD  Consider increasing Novolog to 8 units TID  Will continue to follow glucose trends.   Thank you. Lorenda Peck, RD, LDN, [place] Inpatient Diabetes Coordinator [phone]

## 2022-12-14 NOTE — Progress Notes (Signed)
   12/14/22 1352  Psych Admission Type (Psych Patients Only)  Admission Status Involuntary  Psychosocial Assessment  Patient Complaints Anxiety  Eye Contact Fair  Facial Expression Anxious;Animated  Affect Preoccupied  Dietitian;Restless  Appearance/Hygiene Improved  Behavior Characteristics Cooperative;Appropriate to situation  Mood Anxious  Aggressive Behavior  Effect No apparent injury  Thought Process  Coherency Circumstantial  Content Blaming others  Delusions Persecutory;Paranoid  Perception Hallucinations  Hallucination Auditory  Judgment Poor  Confusion None  Danger to Self  Current suicidal ideation? Denies  Self-Injurious Behavior No self-injurious ideation or behavior indicators observed or expressed   Agreement Not to Harm Self Yes  Description of Agreement verbal  Danger to Others  Danger to Others None reported or observed

## 2022-12-14 NOTE — Group Note (Signed)
[place] [name] Group Therapy   Type of Therapy and Topic:  Group Therapy:  Wellness   Participation Level: Active  Description of Group: This group allows individuals to explore the 6 dimensions of wellness, including spiritual, emotional, intellectual, physical, social, environmental, financial and spiritual. Patients will learn to different ways to practice wellness to improve well-being. Patients also participated in a conversation about what wellness means to them.   Individuals will think about ways in which they currently practice wellness as well as ways they can improve their wellness and new ways to practice wellness.       Therapeutic Goals Patient will verbalize 1 pr 2 we;;mess areas where they are doing well. Patient will identify 2 areas where they would like to improve their wellness.   Patient will provide a definition of what wellness means to them.  Patients will reflect on current hospitalization and primary areas to maintain mental health to prevent re-hospitalization.     Summary of Patient Progress:   Patient was insightful in group and was able to participate appropriately.  She discussed that she wanted to work on her social wellness and get involved in enjoyable activities to meet new people.        Therapeutic Modalities Cognitive Behavioral Therapy Motivational Interviewing   Eldwin Volkov, [name], [place] Social Worker  [facility]

## 2022-12-14 NOTE — [facility] Group Notes (Signed)
Adult Psychoeducational Group Note  Date:  12/14/2022 Time:  11:13 AM  Group Topic/Focus:  Goals Group:   The focus of this group is to help patients establish daily goals to achieve during treatment and discuss how the patient can incorporate goal setting into their daily lives to aide in recovery.  Participation Level:  Did Not Attend    Dub Mikes 12/14/2022, 11:13 AM
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
## 2022-12-15 ENCOUNTER — Other Ambulatory Visit (HOSPITAL_COMMUNITY): Payer: Self-pay

## 2022-12-15 ENCOUNTER — Encounter (HOSPITAL_COMMUNITY): Payer: Self-pay

## 2022-12-15 DIAGNOSIS — F19959 Other psychoactive substance use, unspecified with psychoactive substance-induced psychotic disorder, unspecified: Secondary | ICD-10-CM | POA: Diagnosis not present

## 2022-12-15 LAB — GLUCOSE, CAPILLARY
Glucose-Capillary: 176 mg/dL — ABNORMAL HIGH (ref 70–99)
Glucose-Capillary: 177 mg/dL — ABNORMAL HIGH (ref 70–99)
Glucose-Capillary: 137 mg/dL — ABNORMAL HIGH (ref 70–99)
Glucose-Capillary: 312 mg/dL — ABNORMAL HIGH (ref 70–99)
Glucose-Capillary: 303 mg/dL — ABNORMAL HIGH (ref 70–99)

## 2022-12-15 MED ORDER — GABAPENTIN 400 MG PO CAPS
400.0000 mg | ORAL_CAPSULE | Freq: Three times a day (TID) | ORAL | Status: DC
  Administered 2022-12-15 – 2022-12-17 (×6): 400 mg via ORAL
  Filled 2022-12-15 (×12): qty 1

## 2022-12-15 MED ORDER — NICOTINE 14 MG/24HR TD PT24
14.0000 mg | MEDICATED_PATCH | Freq: Every day | TRANSDERMAL | Status: DC
  Administered 2022-12-15 – 2022-12-17 (×3): 14 mg via TRANSDERMAL
  Filled 2022-12-15 (×6): qty 1

## 2022-12-15 MED ORDER — PALIPERIDONE PALMITATE ER 156 MG/ML IM SUSY
156.0000 mg | PREFILLED_SYRINGE | Freq: Once | INTRAMUSCULAR | Status: DC
  Filled 2022-12-15: qty 1

## 2022-12-15 MED ORDER — PALIPERIDONE PALMITATE ER 156 MG/ML IM SUSY
156.0000 mg | PREFILLED_SYRINGE | Freq: Every day | INTRAMUSCULAR | 0 refills | Status: DC
  Filled 2022-12-15: qty 1, 30d supply, fill #0

## 2022-12-15 MED ORDER — PALIPERIDONE ER 6 MG PO TB24
6.0000 mg | ORAL_TABLET | Freq: Every day | ORAL | Status: DC
  Administered 2022-12-15 – 2022-12-16 (×2): 6 mg via ORAL
  Filled 2022-12-15 (×4): qty 1

## 2022-12-15 MED ORDER — DOCUSATE SODIUM 100 MG PO CAPS
100.0000 mg | ORAL_CAPSULE | Freq: Two times a day (BID) | ORAL | Status: DC
  Administered 2022-12-15 – 2022-12-17 (×5): 100 mg via ORAL
  Filled 2022-12-15 (×9): qty 1

## 2022-12-15 MED ORDER — PALIPERIDONE PALMITATE ER 234 MG/1.5ML IM SUSY
234.0000 mg | PREFILLED_SYRINGE | Freq: Once | INTRAMUSCULAR | Status: AC
  Administered 2022-12-15: 234 mg via INTRAMUSCULAR
  Filled 2022-12-15: qty 1.5

## 2022-12-15 MED ORDER — FLEET ENEMA 7-19 GM/118ML RE ENEM
1.0000 | ENEMA | Freq: Once | RECTAL | Status: AC
  Administered 2022-12-15: 1 via RECTAL
  Filled 2022-12-15: qty 1

## 2022-12-15 MED ORDER — PALIPERIDONE PALMITATE ER 234 MG/1.5ML IM SUSY
234.0000 mg | PREFILLED_SYRINGE | Freq: Every day | INTRAMUSCULAR | 0 refills | Status: DC
  Filled 2022-12-15: qty 1.5, 28d supply, fill #0

## 2022-12-15 NOTE — Group Note (Signed)
Recreation Therapy Group Note   Group Topic:Other  Group Date: 12/15/2022 Start Time: 1000 End Time: 5456 Facilitators: Amika Tassin-McCall, LRT,CTRS Location: [address] Dayroom   Goal Area(s) Addresses:  Patient will review and complete packet dealing with self care concepts and techniques to support ways to provide self care.    Affect/Mood: N/A   Participation Level: N/A    Clinical Observations/Individualized Feedback: Due to the acuity on the unit, LRT was unable to lead group session.  LRT left packets on unit for patients dealing with self care and coping skills.      Plan: Continue to engage patient in RT group sessions 2-3x/week.   Felicitas Sine-McCall, LRT,CTRS 12/15/2022 12:08 PM

## 2022-12-15 NOTE — Progress Notes (Signed)
Adult Psychoeducational Group Note  Date:  12/15/2022 Time:  8:12 PM  Group Topic/Focus:  Wrap-Up Group:   The focus of this group is to help patients review their daily goal of treatment and discuss progress on daily workbooks.  Participation Level:  Active  Participation Quality:  Appropriate  Affect:  Appropriate  Cognitive:  Appropriate  Insight: Appropriate  Engagement in Group:  Engaged  Modes of Intervention:  Discussion  Additional Comments:  Magaret said her day was a 5. The one positive thing that happen today she was able to get her medication stable  Barbette Hair 12/15/2022, 8:12 PM

## 2022-12-15 NOTE — Progress Notes (Signed)
   12/15/22 0200  Psych Admission Type (Psych Patients Only)  Admission Status Involuntary  Psychosocial Assessment  Patient Complaints Anxiety  Eye Contact Fair  Facial Expression Anxious;Animated  Affect Appropriate to circumstance  Speech Logical/coherent  Interaction Assertive  Motor Activity Restless  Appearance/Hygiene Improved  Behavior Characteristics Appropriate to situation;Cooperative  Mood Anxious  Thought Process  Coherency Circumstantial  Content Blaming others  Delusions None reported or observed  Perception WDL  Hallucination None reported or observed  Judgment Poor  Confusion None  Danger to Self  Current suicidal ideation? Denies  Self-Injurious Behavior No self-injurious ideation or behavior indicators observed or expressed   Agreement Not to Harm Self Yes  Description of Agreement verbal  Danger to Others  Danger to Others None reported or observed

## 2022-12-15 NOTE — Progress Notes (Signed)
Erika Rhodes took the mattress from bed 1 and  put it on top bed 2. The bed is too high for safety RN notify

## 2022-12-15 NOTE — Progress Notes (Signed)
[facility] MD Progress Note  12/15/2022 3:21 PM Erika Rhodes  MRN:  [id number]  Subjective:    Erika Rhodes is a 42 y.o. female with PMH of BiPD1, benzo-induced psychosis, benzo use d/o, cannabis use d/o, BoPD, multiple suicide attempts, multiple inpatient psych admission, DM2, uncontrolled, who presented to voluntary to [facility] (12/09/2022) for a psychosis, mania, HI (towards partner), SI five days after starting a prednisone taper 6 days prior, then transferred Involuntary (EDP) to [facility] (12/10/2022) for stabilization of acute mania and psychosis.   On assessment today, patient is more calm and able to participate logically in discussion about her symptoms and treatment.  Patient reports that Hinda Glatter is helpful for mood stabilization, and is agreeable to getting the long-acting injectable.  We discussed that she will get the first LAI today, and the second dose in 4 days.  We discussed she will be hospitalized during this time.  We discussed that we plan to discontinue the oral Invega once the patient receives the second dose, and if the patient is doing well at that time, and able to be discharged in regards to the stability of her symptoms, she will be discharged after receiving the second LAI dose. Patient reports about movement of many days, and we will start Fleet enema for this. She otherwise denies having any psychotic symptoms including hallucinations and paranoia.  Denies any SI or HI.  She did not have any EPS on my physical exam today.    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   [facility] from 09/02/2021 to 09/09/2021 for suicidal attempt by overdosing. Patient refused to take any psychotropic medication during that admission.   [facility] [address] 10/02/2021-10/10/2021, mania, DT Rx fluphenazine 5 mg TID, metformin 500 mg twice daily Current/prior outpatient treatment: Does not follow with any psychiatrist History of suicide: Multiple attempts in the past            [facility] 09/02/2021, non-fatal, OD on pills History of homicide: Unknown Psychiatric medication history: Per patient she has tried all SSRIs, amitriptyline, Valium, temazepam, Ativan, Xanax, trazodone, Minipress, risperidone(bad reaction), Seroquel, Zyprexa.  Cymbalta            Rx rxn: Geodon, haldol, zyprexa, rexulti, risperdal - personal h/o of dystonic rx, slurred speech and unable to walk  Psychiatric medication compliance history: Non compliant, per patient she took Cymbalta for 3 days last week and became psychotic and was running naked outside.  Neuromodulation history: none Current Psychiatrist: None, multiple referral to [facility], has not f/u Current therapist: None, multiple referral to [facility], has not f/u  Past Medical History:  Past Medical History:  Diagnosis Date   Allergic reaction    Anxiety    Asthma    Benzodiazepine-induced psychosis, 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/04/2018   Cellulitis 09/08/2022   Depression    DM (diabetes mellitus), gestational    Facial cellulitis 09/09/2022   Gestational diabetes mellitus in pregnancy, unspecified control 09/01/2014  Formatting of this note might be different from the original.  A2GDM on Insulin   Pregnancy regimen:   Date started Early Pregnancy; Dose N-70 units QAM, N 38 units QHS; Humalog 18 units premeal     Baseline A1C (if  diagnosed < 24 weeks) 8.1% on 08/2014  Baseline HELLP panel and upc (if diagnosed < than 24 weeks):  reviewed and normal  Growth ultrasound at 30-32 weeks reviewed and notable for [facility] 9   History of cesarean section 05/24/2014   Formatting of this note might be different from the original.  Overview:   C/s with g1, two term VBACs following c/s  Desires TOLAC  Previous CD times 1 for failure to progress.   Operative report(s): unavailable.  Options for mode of delivery discussed with patient and patient: desires TOLAC.    TOLAC consents signed: yes. Signed today 1/28   Intentional overdose (HCC) 09/01/2021   Migraines    PTSD (post-traumatic stress disorder) 01/12/2021   Rheumatoid arthritis (HCC) 2016   Diagnosed Dr.  Patsi Sears; has been treated with MTX, Humira, etc, but made her sick.  Was taking CBD oil and stopped as tested positive in a drug screen for work.   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    Past Surgical History:  Procedure Laterality Date   ABDOMINAL HYSTERECTOMY  2017   Fibroids; Both ovaries intact still   BACK SURGERY     CESAREAN SECTION  2009   CHOLECYSTECTOMY  2010   laparoscopic   TONSILLECTOMY     Family History:  Family History  Problem Relation Age of Onset   Arthritis Mother        Rheumatoid   Depression Daughter        and anxiety   Allergies Son    Family Psychiatric  History:  Medical Diagnoses: Asthma, GERD, acne, DM2 uncontrolled  Home Rx: Albuterol inhaler 1 to 2 puffs every 4 hours as needed for wheezing, melatonin 6 mg nightly, doxycycline 100 mg 2 times daily (per patient she almost finished 30-day course ), and nasal spray Head trauma, LOC, concussions, seizures: Denies Allergies: Azithromycin  PCP: None    Social History:  Social History   Substance and Sexual Activity  Alcohol Use Yes   Alcohol/week: 2.0 standard drinks of alcohol   Types: 2 Glasses of wine per  week   Comment: occ     Social History   Substance and Sexual Activity  Drug Use Yes   Types: Marijuana   Comment: "cannabis delta 10"    Social History   Socioeconomic History   Marital status: Single    Spouse name: Not on file   Number of children: 5   Years of education: 14   Highest education level: Associate degree: academic program  Occupational History   Not on file  Tobacco Use   Smoking status: Never   Smokeless tobacco: Never  Vaping Use   Vaping Use: Some days  Substance and Sexual Activity   Alcohol use: Yes    Alcohol/week: 2.0 standard drinks of alcohol    Types: 2 Glasses of wine per week    Comment: occ   Drug use: Yes    Types: Marijuana    Comment: "cannabis delta 10"   Sexual activity: Yes    Birth control/protection: Surgical  Other Topics Concern   Not on file  Social History Narrative   Lives with current boyfriend   See history of  present illness for more history 04/22/2020   Social Determinants of Health   Financial Resource Strain: Not on file  Food Insecurity: Not on file  Transportation Needs: Not on file  Physical Activity: Not on file  Stress: Not on file  Social Connections: Not on file   Additional Social History:                         Sleep: Fair  Appetite:  Good  Current Medications: Current Facility-Administered Medications  Medication Dose Route Frequency Provider Last Rate Last Admin   acetaminophen (TYLENOL) tablet 650 mg  650 mg Oral Q6H PRN Dahlia Byes C, NP   650 mg at 12/10/22 2129   albuterol (VENTOLIN HFA) 108 (90 Base) MCG/ACT inhaler 2 puff  2 puff Inhalation Q4H PRN Dahlia Byes C, NP   2 puff at 12/15/22 0500   alum & mag hydroxide-simeth (MAALOX/MYLANTA) 200-200-20 MG/5ML suspension 30 mL  30 mL Oral Q4H PRN Dahlia Byes C, NP       chlorproMAZINE (THORAZINE) injection 50 mg  50 mg Intramuscular TID PRN Princess Bruins, DO   50 mg at 12/11/22 1322   chlorproMAZINE (THORAZINE)  tablet 50 mg  50 mg Oral TID PRN Princess Bruins, DO   50 mg at 12/12/22 2106   diazepam (VALIUM) tablet 5 mg  5 mg Oral Q12H PRN Legacy Carrender, Harrold Donath, MD   5 mg at 12/13/22 0452   docusate sodium (COLACE) capsule 100 mg  100 mg Oral Q12H Loraine Freid, Harrold Donath, MD   100 mg at 12/15/22 1232   gabapentin (NEURONTIN) capsule 400 mg  400 mg Oral TID Phineas Inches, MD   400 mg at 12/15/22 1232   ibuprofen (ADVIL) tablet 400 mg  400 mg Oral TID Rex Kras, MD   400 mg at 12/15/22 1232   insulin aspart (novoLOG) injection 0-20 Units  0-20 Units Subcutaneous TID WC Nkwenti, Tyler Aas, NP   3 Units at 12/15/22 1232   insulin aspart (novoLOG) injection 0-5 Units  0-5 Units Subcutaneous QHS Starleen Blue, NP   4 Units at 12/14/22 2043   insulin aspart (novoLOG) injection 6 Units  6 Units Subcutaneous TID WC Starleen Blue, NP   6 Units at 12/15/22 1231   insulin detemir (LEVEMIR) injection 15 Units  15 Units Subcutaneous QHS Princess Bruins, DO   15 Units at 12/14/22 2042   LORazepam (ATIVAN) tablet 2 mg  2 mg Oral Q6H PRN Nadene Witherspoon, Harrold Donath, MD   2 mg at 12/15/22 0745   Or   LORazepam (ATIVAN) injection 2 mg  2 mg Intramuscular Q6H PRN Loralai Eisman, Harrold Donath, MD   2 mg at 12/11/22 1323   magnesium hydroxide (MILK OF MAGNESIA) suspension 30 mL  30 mL Oral Daily PRN Dahlia Byes C, NP   30 mL at 12/14/22 1522   metFORMIN (GLUCOPHAGE-XR) 24 hr tablet 750 mg  750 mg Oral BID WC Rex Kras, MD   750 mg at 12/15/22 0744   nicotine polacrilex (NICORETTE) gum 2 mg  2 mg Oral PRN Rex Kras, MD   2 mg at 12/15/22 0745   oxymetazoline (AFRIN) 0.05 % nasal spray 1 spray  1 spray Each Nare BID PRN Dahlia Byes C, NP   1 spray at 12/15/22 0459   [START ON 12/19/2022] paliperidone (INVEGA SUSTENNA) injection 156 mg  156 mg Intramuscular Once Searcy Miyoshi, Harrold Donath, MD       paliperidone (INVEGA SUSTENNA) injection 234 mg  234 mg Intramuscular Once Arleigh Odowd, Harrold Donath,  MD       paliperidone (INVEGA) 24 hr tablet  6 mg  6 mg Oral QHS Lasondra Hodgkins, MD        Lab Results:  Results for orders placed or performed during the hospital encounter of 12/10/22 (from the past 48 hour(s))  Glucose, capillary     Status: Abnormal   Collection Time: 12/13/22  5:07 PM  Result Value Ref Range   Glucose-Capillary 324 (H) 70 - 99 mg/dL    Comment: Glucose reference range applies only to samples taken after fasting for at least 8 hours.  Glucose, capillary     Status: Abnormal   Collection Time: 12/13/22  8:03 PM  Result Value Ref Range   Glucose-Capillary 296 (H) 70 - 99 mg/dL    Comment: Glucose reference range applies only to samples taken after fasting for at least 8 hours.  Glucose, capillary     Status: Abnormal   Collection Time: 12/14/22  5:50 AM  Result Value Ref Range   Glucose-Capillary 230 (H) 70 - 99 mg/dL    Comment: Glucose reference range applies only to samples taken after fasting for at least 8 hours.   Comment 1 Notify RN   Glucose, capillary     Status: Abnormal   Collection Time: 12/14/22 12:01 PM  Result Value Ref Range   Glucose-Capillary 190 (H) 70 - 99 mg/dL    Comment: Glucose reference range applies only to samples taken after fasting for at least 8 hours.  Glucose, capillary     Status: Abnormal   Collection Time: 12/14/22  4:41 PM  Result Value Ref Range   Glucose-Capillary 380 (H) 70 - 99 mg/dL    Comment: Glucose reference range applies only to samples taken after fasting for at least 8 hours.  Glucose, capillary     Status: Abnormal   Collection Time: 12/14/22  7:59 PM  Result Value Ref Range   Glucose-Capillary 342 (H) 70 - 99 mg/dL    Comment: Glucose reference range applies only to samples taken after fasting for at least 8 hours.  Glucose, capillary     Status: Abnormal   Collection Time: 12/15/22  6:04 AM  Result Value Ref Range   Glucose-Capillary 176 (H) 70 - 99 mg/dL    Comment: Glucose reference range applies only to samples taken after fasting for at least 8  hours.  Glucose, capillary     Status: Abnormal   Collection Time: 12/15/22 10:24 AM  Result Value Ref Range   Glucose-Capillary 177 (H) 70 - 99 mg/dL    Comment: Glucose reference range applies only to samples taken after fasting for at least 8 hours.  Glucose, capillary     Status: Abnormal   Collection Time: 12/15/22 12:09 PM  Result Value Ref Range   Glucose-Capillary 137 (H) 70 - 99 mg/dL    Comment: Glucose reference range applies only to samples taken after fasting for at least 8 hours.    Blood Alcohol level:  Lab Results  Component Value Date   ETH <10 12/09/2022   ETH <10 73/71/0626    Metabolic Disorder Labs: Lab Results  Component Value Date   HGBA1C 10.7 (H) 12/09/2022   MPG 260.39 12/09/2022   MPG 191.51 09/09/2022   No results found for: "PROLACTIN" Lab Results  Component Value Date   CHOL 229 (H) 12/12/2022   TRIG 213 (H) 12/12/2022   HDL 52 12/12/2022   CHOLHDL 4.4 12/12/2022   VLDL 43 (H) 12/12/2022   [place]  134 (H) 12/12/2022   LDLCALC 126 (H) 12/26/2021    Physical Findings: AIMS: Facial and Oral Movements Muscles of Facial Expression: None, normal Lips and Perioral Area: None, normal Jaw: None, normal Tongue: None, normal,Extremity Movements Upper (arms, wrists, hands, fingers): None, normal Lower (legs, knees, ankles, toes): None, normal, Trunk Movements Neck, shoulders, hips: None, normal, Overall Severity Severity of abnormal movements (highest score from questions above): None, normal Incapacitation due to abnormal movements: None, normal Patient's awareness of abnormal movements (rate only patient's report): No Awareness, Dental Status Current problems with teeth and/or dentures?: No Does patient usually wear dentures?: No  CIWA:    COWS:     Musculoskeletal: Strength & Muscle Tone: Laying in bed   Gait & Station: Laying in bed   Patient leans: Laying in bed    Psychiatric Specialty Exam:  Presentation  General Appearance:   Disheveled  Eye Contact: Poor  Speech: Slow  Speech Volume: Decreased  Handedness: Right   Mood and Affect  Mood: Dysphoric  Affect: Depressed   Thought Process  Thought Processes: Linear  Descriptions of Associations:Intact  Orientation:Full (Time, Place and Person)  Thought Content:Illogical; Perseveration; Rumination  History of Schizophrenia/Schizoaffective disorder:No  Duration of Psychotic Symptoms:Less than six months  Hallucinations:Hallucinations: None  Ideas of Reference:None  Suicidal Thoughts:Suicidal Thoughts: No  Homicidal Thoughts:Homicidal Thoughts: No   Sensorium  Memory: Immediate Fair; Recent Poor; Remote Fair  Judgment: Impaired  Insight: Lacking   Executive Functions  Concentration: Fair  Attention Span: Fair  Recall: [employer] of Knowledge: Fair  Language: Fair   Psychomotor Activity  Psychomotor Activity: Psychomotor Activity: Decreased   Assets  Assets: Communication Skills; Social Support   Sleep  Sleep: Sleep: Fair    Physical Exam: Physical Exam Vitals reviewed.  Constitutional:      Appearance: Normal appearance.  Pulmonary:     Effort: Pulmonary effort is normal.  Neurological:     Mental Status: She is alert.    Review of Systems  Constitutional:  Negative for chills and fever.  Cardiovascular:  Negative for chest pain and palpitations.  Neurological:  Negative for dizziness, tingling, tremors and headaches.  Psychiatric/Behavioral:  The patient is nervous/anxious.    Blood pressure 110/87, pulse (!) 118, temperature 98.1 F (36.7 C), temperature source Oral, resp. rate 16, height 5\' 3"  (1.6 m), weight 79.8 kg, SpO2 100 %. Body mass index is 31.18 kg/m.   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 2nd exam completed 12/11/2022, exp 12/18/2022             -- Daily contact with patient to assess and evaluate symptoms and progress in treatment             -- Patient's case to be discussed in multi-disciplinary team meeting             -- Observation Level : q15 minute checks             --  Vital signs:  q12 hours             -- Precautions: suicide, elopement, and assault             -- Forced Med Protocol started 12/11/2022, exp 12/18/2022                         PO invega 6 mg qHS, if refuses thorazine 50 mg IM             -- Unit restriction, started 12/11/2022   2. Psychiatric Diagnoses and Treatment:    Decrease Invega from 9 mg qHS to 6 mg qhs for bipolar disorder - decrease dose as pt received invega sustenna 234 mg LAI today. Next LAI 156 mg dose is due otn Saturday (4 days from now). Plan to dc oral invega after second loading dose of invega.  Administer invega sustenna LAI 234 mg LAI today Hold order for IM thorazine as pt received this with invega for the last 2 nights - unclear from nursing documentation if this was given as PRN but pulled as forced med.  Previously d/c Zyprexa 5 mg qHS p.o. Completed Prednisone taper per below Continue agitation PRN with thorazine oral and IM    -start 1 dose fleet enema for constipation  Chronic BZO misuse Per PDMP, filled Diazepam 5 Mg x60 tabs for 30 days on 11/26/2022.Positive on UDS Continued home valium 5 mg q12hours   Cannabis use d/o Positive on UDS.  Encouraged cessation   -- The risks/benefits/side-effects/alternatives to this medication were discussed in detail with the  patient and time was given for questions. The patient consents to medication trial.              -- Metabolic profile and EKG monitoring obtained while on an atypical antipsychotic              -- Encouraged patient to participate in unit milieu and in scheduled group therapies               3. Medical Issues Being Addressed:    Prednisone taper Started 10/05/2023. For self reported RA flare. Suspected prednisone misuse due  Prednisone 60 mg breakfast for 6 days- completed 12/09/2022 Prednisone 40 mg daily with breakfast - completed 12/11/2022 Prednisone 30 mg daily with breakfast - last dose 12/12/2022 Prednisone 20 mg daily with breakfast - last dose 12/13/2022 Prednisone 10 mg daily with breakfast - last dose 12/14/2022   DM2, uncontrolled A1c 10.7 (12/09/2022). CBGs ~200s-400s (IP CBG goal <180).  CGBs q4hrs & mSSI  Diabetic coordinator consulted INCREASED long acting insulin Levemir 10u to 15u qHS CHANGED metformin 500 mg BID to metformin XR 500 mg BID, with plans to titrate up to 1000 mg BID   Mild hypokalemia K+ 3.2 Klor-Con 20 mEq x 2 doses-COMPLETED   Christoper Allegra, MD 12/15/2022, 3:21 PM   Total Time Spent in Direct Patient Care:  I personally spent 35 minutes on the unit in direct patient care. The direct patient care time included face-to-face time with the patient, reviewing the patient's chart, communicating with other professionals, and coordinating care. Greater than 50% of this time was spent in counseling or coordinating care with the patient regarding goals of hospitalization, psycho-education, and discharge planning needs.   Janine Limbo, MD Psychiatrist

## 2022-12-15 NOTE — Progress Notes (Signed)
Pt on unit. Pt medication compliant. Pt complained of no bm x 5 days. Pt received enema per her request. Pt requested multiple trays and salad and sides at lunch. Nurse offered food in mediated. 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 Pt compliant with medication. Q 15 minute checks ongoing.

## 2022-12-16 ENCOUNTER — Encounter (HOSPITAL_COMMUNITY): Payer: Self-pay

## 2022-12-16 DIAGNOSIS — F19959 Other psychoactive substance use, unspecified with psychoactive substance-induced psychotic disorder, unspecified: Secondary | ICD-10-CM | POA: Diagnosis not present

## 2022-12-16 LAB — GLUCOSE, CAPILLARY
Glucose-Capillary: 210 mg/dL — ABNORMAL HIGH (ref 70–99)
Glucose-Capillary: 219 mg/dL — ABNORMAL HIGH (ref 70–99)
Glucose-Capillary: 229 mg/dL — ABNORMAL HIGH (ref 70–99)
Glucose-Capillary: 222 mg/dL — ABNORMAL HIGH (ref 70–99)

## 2022-12-16 MED ORDER — INSULIN ASPART 100 UNIT/ML IJ SOLN
8.0000 [IU] | Freq: Three times a day (TID) | INTRAMUSCULAR | Status: DC
  Administered 2022-12-17: 8 [IU] via SUBCUTANEOUS

## 2022-12-16 MED ORDER — INSULIN DETEMIR 100 UNIT/ML ~~LOC~~ SOLN
35.0000 [IU] | Freq: Every day | SUBCUTANEOUS | Status: DC
  Administered 2022-12-16: 35 [IU] via SUBCUTANEOUS

## 2022-12-16 MED ORDER — BENZTROPINE MESYLATE 0.5 MG PO TABS
0.5000 mg | ORAL_TABLET | Freq: Every day | ORAL | Status: DC
  Administered 2022-12-17: 0.5 mg via ORAL
  Filled 2022-12-16 (×3): qty 1

## 2022-12-16 MED ORDER — TRAZODONE HCL 50 MG PO TABS
50.0000 mg | ORAL_TABLET | Freq: Every evening | ORAL | Status: DC | PRN
  Administered 2022-12-16 – 2022-12-17 (×2): 50 mg via ORAL
  Filled 2022-12-16 (×3): qty 1

## 2022-12-16 MED ORDER — WHITE PETROLATUM EX OINT
TOPICAL_OINTMENT | CUTANEOUS | Status: AC
  Filled 2022-12-16: qty 5

## 2022-12-16 MED ORDER — OXYMETAZOLINE HCL 0.05 % NA SOLN
1.0000 | Freq: Every day | NASAL | Status: DC
  Filled 2022-12-16: qty 15

## 2022-12-16 MED ORDER — FLUTICASONE PROPIONATE 50 MCG/ACT NA SUSP
1.0000 | Freq: Every day | NASAL | Status: DC
  Administered 2022-12-16 – 2022-12-17 (×2): 1 via NASAL
  Filled 2022-12-16: qty 16

## 2022-12-16 NOTE — Progress Notes (Signed)
Adult Psychoeducational Group Note  Date:  12/16/2022 Time:  10:37 AM  Group Topic/Focus:  Goals Group:   The focus of this group is to help patients establish daily goals to achieve during treatment and discuss how the patient can incorporate goal setting into their daily lives to aide in recovery.  Participation Level:  None  Participation Quality:   none  Affect:  Blunted  Cognitive:  Lacking  Insight: None  Engagement in Group:  None  Modes of Intervention:  Activity and Education  Additional Comments:   Pt attended the morning goals group. Pt exited the dayroom approximately 15 min. later and did not return to the group.  [place] 12/16/2022, 10:37 AM

## 2022-12-16 NOTE — Progress Notes (Signed)
[facility] MD Progress Note  12/16/2022 6:30 PM Erika Rhodes  MRN:  [id number]  Subjective:    Erika Rhodes is a 42 y.o. female with PMH of BiPD1, benzo-induced psychosis, benzo use d/o, cannabis use d/o, BoPD, multiple suicide attempts, multiple inpatient psych admission, DM2, uncontrolled, who presented to voluntary to [facility] (12/09/2022) for a psychosis, mania, HI (towards partner), SI five days after starting a prednisone taper 6 days prior, then transferred Involuntary (EDP) to [facility] (12/10/2022) for stabilization of acute mania and psychosis.   On assessment today, patient is more irritable but she is less psychotic and less disorganized.  I have evaluated this patient multiple times over the past year or so, and this type of irritability is a sign that the patient's mania and psychosis are resolving and she is returning to baseline.  She reports that her mood is irritable and anxious.  Denies feeling depressed.  Denies any psychotic symptoms. Is any SI or HI.   She does have a list of things to address: -States the Hinda Glatter is causing her to dissociate and feel the need for self harm.  She reports able to contract for safety at this time and approach staff if these thoughts worsen.  She states that the Hinda Glatter is caused her to want to hit her head.  I did remind her that on the day of admission she was hitting her head, prior to [place] being started.  The patient stated that she was dissociating during the interview but she was very much intact with reality and situation of the interview.  Due to this complaint, the patient does reports she would like to Continue the Invega LAI, but she is willing to continue the oral Invega medication. -Patient stated she would like a rheumatology referral as outpatient, I will do this -Patient reports she has been having dysphagia for months, I encouraged her to follow-up with outpatient PCP for this.  She is eating fine, and had to be put on unit restrictions previously  during the hospitalization due to eating too much and having too high of blood glucose -Patient asked to discontinue unit restriction so she can go to the cafeteria.  I told her I would discontinue unit restrictions but if her blood glucose was above 500, we would reinstate unit restrictions.  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 -Patient requested to continue Afrin.  I told her that we would decrease the frequency, to once daily for 3 days then stop. -Patient asked to start Flonase, which is already been done. -We discussed starting Cogentin for some stiffness that the patient was noticing.  We will monitor constipation with this -Patient stated that when it was helpful for constipation -Patient would like for albuterol inhaler -Patient asked for her own eye mass, earplugs, tank top, and bidet -I told her that if these were allowed due to unit policy, she could have these   No stiffness or parkinsonian symptoms on my exam today.  No tremors.  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   [facility] from 09/02/2021 to 09/09/2021 for suicidal attempt by overdosing. Patient refused to take any psychotropic medication during  that admission.   [place] [address] 10/02/2021-10/10/2021, mania, DT Rx fluphenazine 5 mg TID, metformin 500 mg twice daily Current/prior outpatient treatment: Does not follow with any psychiatrist History of suicide: Multiple attempts in the past            [facility] 09/02/2021, non-fatal, OD on pills History of homicide: Unknown Psychiatric medication history: Per patient she has tried all SSRIs, amitriptyline, Valium, temazepam, Ativan, Xanax, trazodone, Minipress, risperidone(bad reaction), Seroquel, Zyprexa.  Cymbalta            Rx rxn: Geodon, haldol, zyprexa, rexulti, risperdal - personal h/o of dystonic rx, slurred speech and unable to walk  Psychiatric medication compliance history: Non compliant, per patient she took Cymbalta for 3 days last week and became psychotic and was running naked outside.  Neuromodulation history: none Current Psychiatrist: None, multiple referral to [facility], has not f/u Current therapist: None, multiple referral to [facility], has not f/u  Past Medical History:  Past Medical History:  Diagnosis Date   Allergic reaction    Anxiety    Asthma    Benzodiazepine-induced psychosis, 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[place]) 09/02/8526   Cellulitis 09/08/2022   Depression    DM (diabetes mellitus), gestational    Facial cellulitis 09/09/2022   Gestational diabetes mellitus in pregnancy, unspecified control 09/01/2014   Formatting of this note might be different from the original.  A2GDM on Insulin   Pregnancy regimen:   Date started Early Pregnancy; Dose N-70 units QAM, N 38 units QHS; Humalog 18 units premeal     Baseline A1C (if diagnosed < 24 weeks) 8.1% on 08/2014  Baseline HELLP panel and upc (if diagnosed < than 24 weeks):  reviewed and normal  Growth ultrasound at 30-32 weeks reviewed and notable for [facility] 9   History of cesarean section 05/24/2014   Formatting of this note might be different from the original.  Overview:   C/s with g1, two term VBACs following c/s  Desires  TOLAC  Previous CD times 1 for failure to progress.   Operative report(s): unavailable.  Options for mode of delivery discussed with patient and patient: desires TOLAC.    TOLAC consents signed: yes. Signed today 1/28   Intentional overdose ([place]) 09/01/2021   Migraines    PTSD (post-traumatic stress disorder) 01/12/2021   Rheumatoid arthritis ([place]) 2016   Diagnosed Dr.  Lamarr Lulas; has been treated with MTX, Humira, etc, but made her sick.  Was taking CBD oil and stopped as tested positive in a drug screen for work.   Sepsis due to cellulitis ([place]) 09/09/2022   Steroid-induced psychosis with complication ([place]) 78/24/2353    Past Surgical History:  Procedure Laterality Date   ABDOMINAL HYSTERECTOMY  2017   Fibroids; Both ovaries intact still   BACK SURGERY     CESAREAN SECTION  2009   CHOLECYSTECTOMY  2010   laparoscopic   TONSILLECTOMY     Family History:  Family History  Problem Relation Age of Onset   Arthritis Mother        Rheumatoid   Depression Daughter        and anxiety   Allergies Son  Family Psychiatric  History:  Medical Diagnoses: Asthma, GERD, acne, DM2 uncontrolled  Home Rx: Albuterol inhaler 1 to 2 puffs every 4 hours as needed for wheezing, melatonin 6 mg nightly, doxycycline 100 mg 2 times daily (per patient she almost finished 30-day course ), and nasal spray Head trauma, LOC, concussions, seizures: Denies Allergies: Azithromycin  PCP: None    Social History:  Social History   Substance and Sexual Activity  Alcohol Use Yes   Alcohol/week: 2.0 standard drinks of alcohol   Types: 2 Glasses of wine per week   Comment: occ     Social History   Substance and Sexual Activity  Drug Use Yes   Types: Marijuana   Comment: "cannabis delta 10"    Social History   Socioeconomic History   Marital status: Single    Spouse name: Not on file   Number of children: 5   Years of education: 14   Highest education level: Associate degree: academic program   Occupational History   Not on file  Tobacco Use   Smoking status: Never   Smokeless tobacco: Never  Vaping Use   Vaping Use: Some days  Substance and Sexual Activity   Alcohol use: Yes    Alcohol/week: 2.0 standard drinks of alcohol    Types: 2 Glasses of wine per week    Comment: occ   Drug use: Yes    Types: Marijuana    Comment: "cannabis delta 10"   Sexual activity: Yes    Birth control/protection: Surgical  Other Topics Concern   Not on file  Social History Narrative   Lives with current boyfriend   See history of present illness for more history 04/22/2020   Social Determinants of Health   Financial Resource Strain: Not on file  Food Insecurity: Not on file  Transportation Needs: Not on file  Physical Activity: Not on file  Stress: Not on file  Social Connections: Not on file   Additional Social History:                         Sleep: Fair  Appetite:  Good  Current Medications: Current Facility-Administered Medications  Medication Dose Route Frequency Provider Last Rate Last Admin   acetaminophen (TYLENOL) tablet 650 mg  650 mg Oral Q6H PRN Charmaine Downs C, NP   650 mg at 12/16/22 0419   albuterol (VENTOLIN HFA) 108 (90 Base) MCG/ACT inhaler 2 puff  2 puff Inhalation Q4H PRN Charmaine Downs C, NP   2 puff at 12/16/22 0835   alum & mag hydroxide-simeth (MAALOX/MYLANTA) 200-200-20 MG/5ML suspension 30 mL  30 mL Oral Q4H PRN Delfin Gant, NP       [START ON 12/17/2022] benztropine (COGENTIN) tablet 0.5 mg  0.5 mg Oral Daily Keturah Yerby, MD       chlorproMAZINE (THORAZINE) injection 50 mg  50 mg Intramuscular TID PRN Merrily Brittle, DO   50 mg at 12/11/22 1322   chlorproMAZINE (THORAZINE) tablet 50 mg  50 mg Oral TID PRN Merrily Brittle, DO   50 mg at 12/12/22 2106   diazepam (VALIUM) tablet 5 mg  5 mg Oral Q12H PRN Angelika Jerrett, Ovid Curd, MD   5 mg at 12/16/22 1228   docusate sodium (COLACE) capsule 100 mg  100 mg Oral Q12H Cherisse Carrell, Ovid Curd,  MD   100 mg at 12/16/22 0834   fluticasone (FLONASE) 50 MCG/ACT nasal spray 1 spray  1 spray Each Nare Daily Janine Limbo, MD  1 spray at 12/16/22 1223   gabapentin (NEURONTIN) capsule 400 mg  400 mg Oral TID Phineas Inches, MD   400 mg at 12/16/22 1720   ibuprofen (ADVIL) tablet 400 mg  400 mg Oral TID Rex Kras, MD   400 mg at 12/16/22 1720   insulin aspart (novoLOG) injection 0-20 Units  0-20 Units Subcutaneous TID WC Starleen Blue, NP   7 Units at 12/16/22 1720   insulin aspart (novoLOG) injection 0-5 Units  0-5 Units Subcutaneous QHS Starleen Blue, NP   4 Units at 12/15/22 2126   [START ON 12/17/2022] insulin aspart (novoLOG) injection 8 Units  8 Units Subcutaneous TID WC Monic Engelmann, Harrold Donath, MD       insulin detemir (LEVEMIR) injection 35 Units  35 Units Subcutaneous QHS Cedrica Brune, Harrold Donath, MD       LORazepam (ATIVAN) tablet 2 mg  2 mg Oral Q6H PRN Phineas Inches, MD   2 mg at 12/16/22 1120   Or   LORazepam (ATIVAN) injection 2 mg  2 mg Intramuscular Q6H PRN Phineas Inches, MD   2 mg at 12/11/22 1323   magnesium hydroxide (MILK OF MAGNESIA) suspension 30 mL  30 mL Oral Daily PRN Dahlia Byes C, NP   30 mL at 12/14/22 1522   metFORMIN (GLUCOPHAGE-XR) 24 hr tablet 750 mg  750 mg Oral BID WC Rex Kras, MD   750 mg at 12/16/22 1720   nicotine (NICODERM CQ - dosed in mg/24 hours) patch 14 mg  14 mg Transdermal Daily Sonny Poth, Harrold Donath, MD   14 mg at 12/16/22 6378   nicotine polacrilex (NICORETTE) gum 2 mg  2 mg Oral PRN Rex Kras, MD   2 mg at 12/15/22 1612   [START ON 12/17/2022] oxymetazoline (AFRIN) 0.05 % nasal spray 1 spray  1 spray Each Nare Daily Daune Divirgilio, Harrold Donath, MD       paliperidone (INVEGA) 24 hr tablet 6 mg  6 mg Oral QHS Shalondra Wunschel, Harrold Donath, MD   6 mg at 12/15/22 2125   traZODone (DESYREL) tablet 50 mg  50 mg Oral QHS PRN Onuoha, Chinwendu V, NP   50 mg at 12/16/22 0018    Lab Results:  Results for orders placed or performed during the  hospital encounter of 12/10/22 (from the past 48 hour(s))  Glucose, capillary     Status: Abnormal   Collection Time: 12/14/22  7:59 PM  Result Value Ref Range   Glucose-Capillary 342 (H) 70 - 99 mg/dL    Comment: Glucose reference range applies only to samples taken after fasting for at least 8 hours.  Glucose, capillary     Status: Abnormal   Collection Time: 12/15/22  6:04 AM  Result Value Ref Range   Glucose-Capillary 176 (H) 70 - 99 mg/dL    Comment: Glucose reference range applies only to samples taken after fasting for at least 8 hours.  Glucose, capillary     Status: Abnormal   Collection Time: 12/15/22 10:24 AM  Result Value Ref Range   Glucose-Capillary 177 (H) 70 - 99 mg/dL    Comment: Glucose reference range applies only to samples taken after fasting for at least 8 hours.  Glucose, capillary     Status: Abnormal   Collection Time: 12/15/22 12:09 PM  Result Value Ref Range   Glucose-Capillary 137 (H) 70 - 99 mg/dL    Comment: Glucose reference range applies only to samples taken after fasting for at least 8 hours.  Glucose, capillary     Status: Abnormal   Collection  Time: 12/15/22  5:20 PM  Result Value Ref Range   Glucose-Capillary 312 (H) 70 - 99 mg/dL    Comment: Glucose reference range applies only to samples taken after fasting for at least 8 hours.  Glucose, capillary     Status: Abnormal   Collection Time: 12/15/22  8:04 PM  Result Value Ref Range   Glucose-Capillary 303 (H) 70 - 99 mg/dL    Comment: Glucose reference range applies only to samples taken after fasting for at least 8 hours.   Comment 1 Notify RN   Glucose, capillary     Status: Abnormal   Collection Time: 12/16/22  6:07 AM  Result Value Ref Range   Glucose-Capillary 210 (H) 70 - 99 mg/dL    Comment: Glucose reference range applies only to samples taken after fasting for at least 8 hours.   Comment 1 Notify RN   Glucose, capillary     Status: Abnormal   Collection Time: 12/16/22 12:04 PM   Result Value Ref Range   Glucose-Capillary 219 (H) 70 - 99 mg/dL    Comment: Glucose reference range applies only to samples taken after fasting for at least 8 hours.  Glucose, capillary     Status: Abnormal   Collection Time: 12/16/22  5:17 PM  Result Value Ref Range   Glucose-Capillary 229 (H) 70 - 99 mg/dL    Comment: Glucose reference range applies only to samples taken after fasting for at least 8 hours.    Blood Alcohol level:  Lab Results  Component Value Date   ETH <10 12/09/2022   ETH <10 52/77/8242    Metabolic Disorder Labs: Lab Results  Component Value Date   HGBA1C 10.7 (H) 12/09/2022   MPG 260.39 12/09/2022   MPG 191.51 09/09/2022   No results found for: "PROLACTIN" Lab Results  Component Value Date   CHOL 229 (H) 12/12/2022   TRIG 213 (H) 12/12/2022   HDL 52 12/12/2022   CHOLHDL 4.4 12/12/2022   VLDL 43 (H) 12/12/2022   LDLCALC 134 (H) 12/12/2022   LDLCALC 126 (H) 12/26/2021    Physical Findings: AIMS: Facial and Oral Movements Muscles of Facial Expression: None, normal Lips and Perioral Area: None, normal Jaw: None, normal Tongue: None, normal,Extremity Movements Upper (arms, wrists, hands, fingers): None, normal Lower (legs, knees, ankles, toes): None, normal, Trunk Movements Neck, shoulders, hips: None, normal, Overall Severity Severity of abnormal movements (highest score from questions above): None, normal Incapacitation due to abnormal movements: None, normal Patient's awareness of abnormal movements (rate only patient's report): No Awareness, Dental Status Current problems with teeth and/or dentures?: No Does patient usually wear dentures?: No  CIWA:    COWS:     Musculoskeletal: Strength & Muscle Tone: Sitting up in bed  Gait & Station: Sitting up in bed  Patient leans: Sitting up in bed   Psychiatric Specialty Exam:  Presentation  General Appearance:  Casual  Eye Contact: Fair  Speech: Normal Rate  Speech  Volume: Normal  Handedness: Right   Mood and Affect  Mood: Anxious; Dysphoric; Irritable  Affect: Depressed   Thought Process  Thought Processes: Linear  Descriptions of Associations:Intact  Orientation:Full (Time, Place and Person)  Thought Content:Logical  History of Schizophrenia/Schizoaffective disorder:No  Duration of Psychotic Symptoms:Less than six months  Hallucinations:Hallucinations: None   Ideas of Reference:None  Suicidal Thoughts:Suicidal Thoughts: No   Homicidal Thoughts:Homicidal Thoughts: No    Sensorium  Memory: Immediate Fair; Recent Poor; Remote Fair  Judgment: Poor  Insight: Lacking  Executive Functions  Concentration: Fair  Attention Span: Fair  Recall: [employer] of Knowledge: Fair  Language: Fair   Psychomotor Activity  Psychomotor Activity: Psychomotor Activity: Normal    Assets  Assets: Manufacturing systems engineer; Social Support   Sleep  Sleep: Sleep: Fair     Physical Exam: Physical Exam Vitals reviewed.  Constitutional:      Appearance: Normal appearance.  Pulmonary:     Effort: Pulmonary effort is normal.  Neurological:     Mental Status: She is alert.    Review of Systems  Constitutional:  Negative for chills and fever.  Cardiovascular:  Negative for chest pain and palpitations.  Neurological:  Negative for dizziness, tingling, tremors and headaches.  Psychiatric/Behavioral:  The patient is nervous/anxious.    Blood pressure 119/77, pulse (!) 111, temperature 97.6 F (36.4 C), temperature source Oral, resp. rate 16, height 5\' 3"  (1.6 m), weight 79.8 kg, SpO2 98 %. Body mass index is 31.18 kg/m.   Treatment Plan Summary: Daily contact with patient to assess and evaluate symptoms and progress in treatment and Medication management   ASSESSMENT:   Diagnoses / Active Problems:   Bipolar I disorder (HCC)   Cannabis use disorder, moderate, dependence (HCC)   Cluster B personality  disorder in adult [facility])   Uncontrolled type 2 diabetes mellitus with hyperglycemia, without long-term current use of insulin (HCC)     PLAN: Safety and Monitoring:             -- Involuntary admission to inpatient psychiatric unit for safety, stabilization and treatment - IVC'd by EDP. 2nd exam completed 12/11/2022, exp 12/18/2022             -- Daily contact with patient to assess and evaluate symptoms and progress in treatment             -- Patient's case to be discussed in multi-disciplinary team meeting             -- Observation Level : q15 minute checks             -- Vital signs:  q12 hours             -- Precautions: suicide, elopement, and assault             -- Forced Med Protocol started 12/11/2022, exp 12/18/2022                         PO invega 6 mg qHS, if refuses thorazine 50 mg IM             -- Unit restriction, started 12/11/2022   2. Psychiatric Diagnoses and Treatment:    Continue Invega 6 mg qhs for bipolar disorder  Previously Administerred invega sustenna LAI 234 mg LAI 12-15-22 D/c next invega sustenna due to pt reporting s/e - dissociation and self harm thoughts (which were present before starting invega)   Previously d/c Zyprexa 5 mg qHS p.o. Completed Prednisone taper per below Continue agitation PRN with thorazine oral and IM    -Completed 1 dose fleet enema for constipation - effective   Chronic BZO misuse Per PDMP, filled Diazepam 5 Mg x60 tabs for 30 days on 11/26/2022.Positive on UDS Continued home valium 5 mg q12hours   Cannabis use d/o Positive on UDS.  Encouraged cessation   -- The risks/benefits/side-effects/alternatives to this medication were discussed in detail with the patient and time was given for questions. The patient consents to medication trial.              --  Metabolic profile and EKG monitoring obtained while on an atypical antipsychotic              -- Encouraged patient to participate in unit milieu and in scheduled group therapies                3. Medical Issues Being Addressed:    Prednisone taper Started 10/05/2023. For self reported RA flare. Suspected prednisone misuse due  Prednisone 60 mg breakfast for 6 days- completed 12/09/2022 Prednisone 40 mg daily with breakfast - completed 12/11/2022 Prednisone 30 mg daily with breakfast - last dose 12/12/2022 Prednisone 20 mg daily with breakfast - last dose 12/13/2022 Prednisone 10 mg daily with breakfast - last dose 12/14/2022   DM2, uncontrolled A1c 10.7 (12/09/2022). CBGs ~200s-400s (IP CBG goal <180).  CGBs q4hrs & mSSI  Diabetic coordinator consulted INCREASED long acting insulin Levemir 10u to 15u qHS CHANGED metformin 500 mg BID to metformin XR 500 mg BID, with plans to titrate up to 1000 mg BID   Mild hypokalemia K+ 3.2 Klor-Con 20 mEq x 2 doses-COMPLETED   Cristy Hilts, MD 12/16/2022, 6:30 PM   Total Time Spent in Direct Patient Care:  I personally spent 35 minutes on the unit in direct patient care. The direct patient care time included face-to-face time with the patient, reviewing the patient's chart, communicating with other professionals, and coordinating care. Greater than 50% of this time was spent in counseling or coordinating care with the patient regarding goals of hospitalization, psycho-education, and discharge planning needs.   Phineas Inches, MD Psychiatrist

## 2022-12-16 NOTE — Progress Notes (Signed)
Adult Psychoeducational Group Note  Date:  12/16/2022 Time:  11:40 PM  Group Topic/Focus:  Wrap-Up Group:   The focus of this group is to help patients review their daily goal of treatment and discuss progress on daily workbooks.  Participation Level:  Active  Participation Quality:  Appropriate  Affect:  Appropriate  Cognitive:  Appropriate  Insight: Appropriate  Engagement in Group:  Improving  Modes of Intervention:  Discussion  Additional Comments:   Pt stated her goal for today was to focus on her treatment plan and discuss her discharge plan with her doctor. Pt stated she accomplished her goals today. Pt stated she talked with her doctor and with her social worker about her care today. Pt rated her overall day a 2 out of 10. Pt stated she is fustrated and does not know why she is her. Pt stated know one is answering her question.  Pt stated she feels like she is been held here against her will. Pt stated she was able to contact  her boyfriend today which improved her overall day. Pt stated she felt better about herself tonight. Pt stated staff brought back all her meals today because she is on unit restriction. Pt stated she took all medications provided today. Pt stated her appetite was good today. Pt rated her sleep last night was poor. Pt stated the goal tonight was to get some rest. Pt stated she had some physical pain tonight. Pt stated she had severe pain all over her body tonight. Pt rated the severe pain and 10 on the pain level scale. Pt nurse was updated on the situation. Pt deny visual hallucinations and auditory issues tonight. Pt denies thoughts of harming herself or others. Pt stated she would alert staff if anything changed.   Candy Sledge 12/16/2022, 11:40 PM

## 2022-12-16 NOTE — Progress Notes (Addendum)
Pt visible in milieu at long intervals during shift. Presents with fair affect, logical speech, animated with peers on interactions. Noted with multiple somatic complaints with constant request for benzos with incongruent behavior with complaints. Received PRN Valium and Ativan earlier for anxiety with minimal effect when reassessed due to continue demands "Well, I know it's time, it's ordered for me". Denies SI, HI, AVH and pain when assessed. Continues to talk to self at intervals when not engaged with others. Complaint with current treatment regimen. Requires multiple redirections and education on items not allowed in milieu (bidet, ear plugs). Emotional support and reassurance offered to pt this shift. Safety checks maintained at Q 15 minutes intervals without incident. Pt tolerated meals and fluids well. Off unit for dinner with peers, was cooperative while in cafeteria, returned without issues. Per assigned provider unit restriction will be reinstated if pt's CBG is above 500 mg /dl. Pt made aware.

## 2022-12-16 NOTE — Progress Notes (Signed)
   12/16/22 0601  15 Minute Checks  Location Bedroom  Visual Appearance Calm  Behavior Composed  Sleep (Behavioral Health Patients Only)  Calculate sleep? (Click Yes once per 24 hr at 0600 safety check) Yes  Documented sleep last 24 hours 4.25

## 2022-12-16 NOTE — Progress Notes (Signed)
D- Patient alert and oriented. Denies SI, HI, AVH. Patient seen in her room appearing to be responding to internal stimuli. Patient reports, "I feel much more even." Patient states that she had a BM 12/15/22 after enema. Patient complained that she needed something for sleep. One time dose of trazodone ordered.  A- Scheduled medications administered to patient, per MAR. Support and encouragement provided.  Routine safety checks conducted every 15 minutes.  Patient informed to notify staff with problems or concerns.  R-No adverse drug reactions noted. Patient contracts for safety at this time. Patient compliant with medications and treatment plan. Patient receptive, calm, and cooperative. Patient interacts well with others on the unit.  Patient remains safe at this time.

## 2022-12-16 NOTE — Plan of Care (Signed)
  Problem: Education: Goal: Emotional status will improve Outcome: Progressing Goal: Mental status will improve Outcome: Progressing Goal: Verbalization of understanding the information provided will improve Outcome: Progressing   Problem: Coping: Goal: Ability to verbalize frustrations and anger appropriately will improve Outcome: Progressing   Problem: Health Behavior/Discharge Planning: Goal: Compliance with treatment plan for underlying cause of condition will improve Outcome: Progressing

## 2022-12-16 NOTE — Group Note (Signed)
Recreation Therapy Group Note   Group Topic:General Recreation  Group Date: 12/16/2022 Start Time: 1000 End Time: 1100 Facilitators: Glenna Brunkow-McCall, LRT,CTRS Location: [address] Dayroom   Group Goals: Patients will identify how music can be affective in daily life. Patients will identify the benefit of using music to change mood or environment.   Group Description: Music Therapy.  LRT and patients discussed the importance of music in creating a positive environment and its affect on emotions.  Patients were given the opportunity to select songs of their choosing that ment something to them.  LRT expressed to patients their songs had to be clean and appropriate in order to be played during group session.     Affect/Mood: Appropriate   Participation Level: Engaged   Participation Quality: Independent   Behavior: Appropriate   Speech/Thought Process: Focused   Insight: Good   Judgement: Good   Modes of Intervention: Music   Patient Response to Interventions:  Engaged   Education Outcome:  Acknowledges education and In group clarification offered    Clinical Observations/Individualized Feedback: Pt was appropriate and in good spirits.  Pt was social with peers and more engaging.  Pt was also singing along with some of the songs.  Pt was also attentive and focused.    Plan: Continue to engage patient in RT group sessions 2-3x/week.   Danniell Rotundo-McCall, LRT,CTRS 12/16/2022 11:58 AM

## 2022-12-16 NOTE — BH IP Treatment Plan (Signed)
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[place])   Current Medications:  Current Facility-Administered Medications  Medication Dose Route Frequency Provider Last Rate Last Admin   acetaminophen (TYLENOL) tablet 650 mg  650 mg Oral Q6H PRN Charmaine Downs C, NP   650 mg at 12/16/22 0419   albuterol (VENTOLIN HFA) 108 (90 Base) MCG/ACT inhaler 2 puff  2 puff Inhalation Q4H PRN Charmaine Downs C, NP   2 puff at 12/16/22 0835   alum & mag hydroxide-simeth (MAALOX/MYLANTA) 200-200-20 MG/5ML suspension 30 mL  30 mL Oral Q4H PRN Charmaine Downs C, NP       chlorproMAZINE (THORAZINE) injection 50 mg  50 mg Intramuscular TID PRN Merrily Brittle, DO   50 mg at 12/11/22 1322   chlorproMAZINE (THORAZINE) tablet 50 mg  50 mg Oral TID PRN Merrily Brittle, DO   50 mg at 12/12/22 2106   diazepam (VALIUM) tablet 5 mg  5 mg Oral Q12H PRN Massengill, Ovid Curd, MD   5 mg at 12/15/22 2309   docusate sodium (COLACE) capsule 100 mg  100 mg Oral Q12H Massengill, Ovid Curd, MD   100 mg at 12/16/22 0834   fluticasone (FLONASE) 50 MCG/ACT nasal spray 1 spray  1 spray Each Nare Daily Massengill, Nathan, MD       gabapentin (NEURONTIN) capsule 400 mg  400 mg Oral TID Janine Limbo, MD   400 mg at 12/16/22 0834   ibuprofen (ADVIL) tablet 400 mg  400 mg Oral TID Ranae Palms, MD   400 mg at 12/16/22 3220   insulin aspart (novoLOG) injection 0-20 Units  0-20 Units Subcutaneous TID WC Nicholes Rough, NP   4 Units at 12/16/22 0713   insulin  aspart (novoLOG) injection 0-5 Units  0-5 Units Subcutaneous QHS Nicholes Rough, NP   4 Units at 12/15/22 2126   insulin aspart (novoLOG) injection 6 Units  6 Units Subcutaneous TID WC Nicholes Rough, NP   6 Units at 12/16/22 2542   insulin detemir (LEVEMIR) injection 15 Units  15 Units Subcutaneous QHS Merrily Brittle, DO   15 Units at 12/15/22 2126   LORazepam (ATIVAN) tablet 2 mg  2 mg Oral Q6H PRN Massengill, Ovid Curd, MD   2 mg at 12/16/22 0421   Or   LORazepam (ATIVAN) injection 2 mg  2 mg Intramuscular Q6H PRN Massengill, Ovid Curd, MD   2 mg at 12/11/22 1323   magnesium hydroxide (MILK OF MAGNESIA) suspension 30 mL  30 mL Oral Daily PRN Charmaine Downs C, NP   30 mL at 12/14/22 1522   metFORMIN (GLUCOPHAGE-XR) 24 hr tablet 750 mg  750 mg Oral BID WC Ranae Palms, MD   750 mg at 12/16/22 0834   nicotine (NICODERM CQ - dosed in mg/24 hours) patch 14 mg  14 mg Transdermal Daily Massengill, Ovid Curd, MD   14 mg at 12/16/22 0834   nicotine polacrilex (NICORETTE) gum 2 mg  2 mg Oral PRN Ranae Palms, MD   2 mg at 12/15/22 1612   [START ON 12/17/2022] oxymetazoline (AFRIN) 0.05 % nasal spray 1 spray  1 spray Each  Nare Daily Massengill, Harrold Donath, MD       Melene Muller ON 12/19/2022] paliperidone (INVEGA SUSTENNA) injection 156 mg  156 mg Intramuscular Once Massengill, Harrold Donath, MD       paliperidone (INVEGA) 24 hr tablet 6 mg  6 mg Oral QHS Massengill, Harrold Donath, MD   6 mg at 12/15/22 2125   traZODone (DESYREL) tablet 50 mg  50 mg Oral QHS PRN Onuoha, Chinwendu V, NP   50 mg at 12/16/22 0018   PTA Medications: Medications Prior to Admission  Medication Sig Dispense Refill Last Dose   albuterol (PROVENTIL) (2.5 MG/3ML) 0.083% nebulizer solution Take 3 mLs (2.5 mg total) by nebulization every 6 (six) hours as needed for wheezing or shortness of breath. (Patient taking differently: Take 2.5 mg by nebulization as needed for wheezing or shortness of breath.) 75 mL 12    albuterol (VENTOLIN HFA) 108 (90 Base) MCG/ACT  inhaler Inhale 1-2 puffs into the lungs every 4 (four) hours as needed for wheezing or shortness of breath. (Patient not taking: Reported on 12/10/2022) 1 each 0    Aspirin-Acetaminophen-Caffeine (GOODY HEADACHE PO) Take 1 packet by mouth as needed (headache,pain).      clindamycin (CLEOCIN T) 1 % lotion Apply 1 application  topically daily as needed (breakouts). To affected area on face      diazepam (VALIUM) 5 MG tablet Take 1 tablet (5 mg total) by mouth every 6 (six) hours as needed for anxiety. (Patient taking differently: Take 5 mg by mouth 2 (two) times daily.) 12 tablet 0    doxycycline (VIBRA-TABS) 100 MG tablet Take 100 mg by mouth 2 (two) times daily.      Fexofenadine HCl (ALLEGRA PO) Take 1 tablet by mouth daily.      ibuprofen (ADVIL) 200 MG tablet Take 800 mg by mouth as needed for mild pain or moderate pain.      metFORMIN (GLUCOPHAGE) 500 MG tablet Take 1 tablet (500 mg total) by mouth 2 (two) times daily with a meal. 60 tablet 0    metoprolol tartrate (LOPRESSOR) 25 MG tablet Take 0.5 tablets (12.5 mg total) by mouth 2 (two) times daily for 30 doses. 15 tablet 0    naproxen sodium (ALEVE) 220 MG tablet Take 440 mg by mouth daily as needed (pain).      omeprazole (PRILOSEC OTC) 20 MG tablet Take 20 mg by mouth daily.      oxymetazoline (AFRIN) 0.05 % nasal spray Place 2 sprays into both nostrils as needed for congestion.      predniSONE (DELTASONE) 10 MG tablet Take 6 tablets (60 mg total) by mouth daily with breakfast for 6 days, THEN 4 tablets (40 mg total) daily with breakfast for 3 days, THEN 2 tablets (20 mg total) daily with breakfast for 3 days. 54 tablet 0    predniSONE (DELTASONE) 20 MG tablet 3 Tabs PO Days 1-3, then 2 tabs PO Days 4-6, then 1 tab PO Day 7-9, then Half Tab PO Day 10-12 (Patient not taking: Reported on 12/10/2022) 20 tablet 0    tretinoin (RETIN-A) 0.05 % cream Apply topically at bedtime. (Patient not taking: Reported on 12/10/2022)       Patient Stressors:  Health problems   Marital or family conflict   Medication change or noncompliance    Patient Strengths: Average or above average intelligence  General fund of knowledge   Treatment Modalities: Medication Management, Group therapy, Case management,  1 to 1 session with clinician, Psychoeducation, Recreational therapy.   Physician Treatment  Plan for Primary Diagnosis: Bipolar I disorder ([place]) Long Term Goal(s):     Short Term Goals:    Medication Management: Evaluate patient's response, side effects, and tolerance of medication regimen.  Therapeutic Interventions: 1 to 1 sessions, Unit Group sessions and Medication administration.  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  Therapeutic Interventions: 1 to 1 sessions, Unit Group sessions and Medication administration.  Evaluation of Outcomes: Progressing   RN Treatment Plan for Primary Diagnosis: Bipolar I disorder ([place]) Long Term Goal(s): Knowledge of disease and therapeutic regimen to maintain health will improve  Short Term Goals: Ability to remain free from injury will improve, Ability to participate in decision making will improve, Ability to verbalize feelings will improve, Ability to disclose and discuss suicidal ideas, and Ability to identify and develop effective coping behaviors will improve  Medication Management: RN will administer medications as ordered by provider, will assess and  evaluate patient's response and provide education to patient for prescribed medication. RN will report any adverse and/or side effects to prescribing provider.  Therapeutic Interventions: 1 on 1 counseling sessions, Psychoeducation, Medication administration, Evaluate responses to treatment, Monitor vital signs and CBGs as ordered, Perform/monitor CIWA, COWS, AIMS and Fall Risk screenings as ordered, Perform wound care treatments as ordered.  Evaluation of Outcomes: Progressing   [name] Treatment Plan for Primary Diagnosis: Bipolar I disorder ([place]) Long Term Goal(s): Safe transition to appropriate next level of care at discharge, Engage patient in therapeutic group addressing interpersonal concerns.  Short Term Goals: Engage patient in aftercare planning with referrals and resources, Increase social support, Increase emotional regulation, Facilitate acceptance of mental health diagnosis and concerns, Identify triggers associated with mental health/substance abuse issues, and Increase skills for wellness and recovery  Therapeutic Interventions: Assess for all discharge needs, 1 to 1 time with Social worker, Explore available resources and support systems, Assess for adequacy in community support network, Educate family and significant other(s) on suicide prevention, Complete Psychosocial Assessment, Interpersonal group therapy.  Evaluation of Outcomes: Progressing   Progress in Treatment: Attending groups: Yes. Participating in groups: Yes. Taking medication as prescribed: Yes. Toleration medication: Yes. Family/Significant other contact made: No, will contact:   Elberta Fortis (Boyfriend)- [phone] Patient understands diagnosis: Yes. Discussing patient identified problems/goals with staff: Yes. Medical problems stabilized or resolved: Yes. Denies suicidal/homicidal ideation: Yes. Issues/concerns per patient self-inventory: Yes. Other:    New problem(s) identified: No, Describe:  None  Reported   New Short Term/Long Term Goal(s): stabilization, elimination of SI thoughts, development of comprehensive mental wellness plan.  medication    Patient Goals:  None Reported   Discharge Plan or Barriers: Patient recently admitted. CSW will continue to follow and assess for appropriate referrals and possible discharge planning.      Reason for Continuation of Hospitalization: Depression Medication stabilization Suicidal ideation Delusions   Estimated Length of Stay: 3-7 Days   Last [place] Suicide Severity Risk Score: [place] Admission (Current) from 12/10/2022 in [place] [facility] ED from  12/09/2022 in [facility] Emergency Department at [facility] from 12/03/2022 in [facility] Emergency Department at [place] High Risk High Risk No Risk       Last PHQ 2/9 Scores:    10/01/2021    4:29 PM  Depression screen PHQ 2/9  Decreased Interest 0  Down, Depressed, Hopeless 0  PHQ - 2 Score 0    Scribe for Treatment Team: Carney Harder 12/16/2022 10:32 AM

## 2022-12-17 DIAGNOSIS — F19959 Other psychoactive substance use, unspecified with psychoactive substance-induced psychotic disorder, unspecified: Secondary | ICD-10-CM | POA: Diagnosis not present

## 2022-12-17 LAB — GLUCOSE, CAPILLARY: Glucose-Capillary: 154 mg/dL — ABNORMAL HIGH (ref 70–99)

## 2022-12-17 MED ORDER — BENZTROPINE MESYLATE 0.5 MG PO TABS: 0.5000 mg | ORAL_TABLET | Freq: Every day | ORAL | 0 refills | Status: DC

## 2022-12-17 MED ORDER — METFORMIN HCL ER 750 MG PO TB24: 750.0000 mg | ORAL_TABLET | Freq: Two times a day (BID) | ORAL | 0 refills | Status: AC

## 2022-12-17 MED ORDER — INSULIN ASPART 100 UNIT/ML IJ SOLN: 0.0000 [IU] | Freq: Every day | INTRAMUSCULAR | 11 refills | Status: DC

## 2022-12-17 MED ORDER — FLUTICASONE PROPIONATE 50 MCG/ACT NA SUSP: 1.0000 | Freq: Every day | NASAL | 2 refills | Status: DC

## 2022-12-17 MED ORDER — TRAZODONE HCL 50 MG PO TABS: 50.0000 mg | ORAL_TABLET | Freq: Every evening | ORAL | 0 refills | Status: DC | PRN

## 2022-12-17 MED ORDER — INSULIN ASPART 100 UNIT/ML IJ SOLN: 8.0000 [IU] | Freq: Three times a day (TID) | INTRAMUSCULAR | 11 refills | Status: DC

## 2022-12-17 MED ORDER — DOCUSATE SODIUM 100 MG PO CAPS: 100.0000 mg | ORAL_CAPSULE | Freq: Two times a day (BID) | ORAL | 0 refills | Status: DC

## 2022-12-17 MED ORDER — INSULIN ASPART 100 UNIT/ML IJ SOLN: 0.0000 [IU] | Freq: Three times a day (TID) | INTRAMUSCULAR | 11 refills | Status: AC

## 2022-12-17 MED ORDER — NICOTINE POLACRILEX 2 MG MT GUM: 2.0000 mg | CHEWING_GUM | OROMUCOSAL | 0 refills | Status: DC | PRN

## 2022-12-17 MED ORDER — NICOTINE 14 MG/24HR TD PT24: 14.0000 mg | MEDICATED_PATCH | Freq: Every day | TRANSDERMAL | 0 refills | Status: DC

## 2022-12-17 MED ORDER — BLOOD GLUCOSE METER KIT: PACK | 0 refills | Status: AC

## 2022-12-17 MED ORDER — PALIPERIDONE ER 6 MG PO TB24: 6.0000 mg | ORAL_TABLET | Freq: Every day | ORAL | 0 refills | Status: AC

## 2022-12-17 MED ORDER — INSULIN DETEMIR 100 UNIT/ML ~~LOC~~ SOLN: 35.0000 [IU] | Freq: Every day | SUBCUTANEOUS | 11 refills | Status: DC

## 2022-12-17 MED ORDER — GABAPENTIN 400 MG PO CAPS: 400.0000 mg | ORAL_CAPSULE | Freq: Three times a day (TID) | ORAL | 0 refills | Status: AC

## 2022-12-17 NOTE — Discharge Instructions (Signed)
-  Follow-up with your outpatient psychiatric provider -instructions on appointment date, time, and address (location) are provided to you in discharge paperwork.  -Take your psychiatric medications as prescribed at discharge - instructions are provided to you in the discharge paperwork  -Follow-up with outpatient primary care doctor and other specialists -for management of preventative medicine and any chronic medical disease.  -Recommend abstinence from alcohol, tobacco, and other illicit drug use at discharge.   -If your psychiatric symptoms recur, worsen, or if you have side effects to your psychiatric medications, call your outpatient psychiatric provider, 911, [phone] or go to the nearest emergency department.  -If suicidal thoughts occur, call your outpatient psychiatric provider, 911, [phone] or go to the nearest emergency department.  Naloxone (Narcan) can help reverse an overdose when given to the victim quickly.  [place] offers free naloxone kits and instructions/training on its use.  Add naloxone to your first aid kit and you can help save a life.   Pick up your free kit at the following locations:   [place]:  [facility], [address] [place] [address] ([phone]) [facility] [address] [place] [id number] ([phone]) [facility] [address] [place] [address]  [place]: [facility] [address] [place] [address] ([phone]) [facility] [address] [place] [address] ([phone])  

## 2022-12-17 NOTE — [facility] Suicide Risk Assessment (Signed)
[facility] Discharge Suicide Risk Assessment   Principal Problem: Bipolar I disorder [facility]) Discharge Diagnoses: Principal Problem:   Bipolar I disorder ([place]) Active Problems:   Cannabis use disorder, moderate, dependence ([place])   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 [facility])   Hypokalemia   Uncontrolled type 2 diabetes mellitus with hyperglycemia, without long-term current use of insulin (H[facility])   Steroid-induced psychosis with complication ([place])   Total Time spent with patient: 15 minutes   Erika Rhodes is a 42 y.o. female with PMH of BiPD1, benzo-induced psychosis, benzo use d/o, cannabis use d/o, BoPD, multiple suicide attempts, multiple inpatient psych admission, DM2, uncontrolled, who presented to voluntary to [facility] (12/09/2022) for a psychosis, mania, HI (towards partner), SI five days after starting a prednisone taper 6 days prior, then transferred Involuntary (EDP) to [facility] (12/10/2022) for stabilization of acute mania and psychosis.      During the patient's hospitalization, patient had extensive initial psychiatric evaluation, and follow-up psychiatric evaluations every day.  Psychiatric diagnoses provided upon initial assessment:  Bipolar I disorder ([place]) - pt was manic and psychotic 2/2 steroid prescription but has bipolar d/o previously diagnosed    Cannabis use disorder, moderate, dependence ([place])   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 [facility])   Uncontrolled type 2 diabetes mellitus with hyperglycemia, without long-term current use of insulin ([place])  Patient's psychiatric medications were adjusted on admission:  STARTED Invega 6 mg qHS p.o. Thorazine 50 mg IM if patient refuses DISCONTINUED Zyprexa 5 mg qHS p.o. Prednisone taper   During the hospitalization, other adjustments were made to the patient's psychiatric medication regimen:  -started invega sustenna lai 234 mg IM on 12-15-22 - after this pt reported feeling self harm intrusive thoughts (resolved by discharge) and  dissociative epsiodes that she attributes to LAI and not oral invega. She declined to get second invega Lai loading dose. But is agreeable to continue oral invega   Patient's care was discussed during the interdisciplinary team meeting every day during the hospitalization.  The patient reported self harm intrusive thoughts (resolved by discharge) attributed to invega LAI (not oral), and dissociative episodes to [place] (not oral). She otherwise denies having any side effects to prescribed psychiatric medication.  Gradually, patient started adjusting to milieu. The patient was evaluated each day by a clinical provider to ascertain response to treatment. Improvement was noted by the patient's report of decreasing symptoms, improved sleep and appetite, affect, medication tolerance, behavior, and participation in unit programming.  Patient was asked each day to complete a self inventory noting mood, mental status, pain, new symptoms, anxiety and concerns.    Symptoms were reported as significantly decreased or resolved completely by discharge.   On day of discharge, the patient reports that their mood is stable. The patient denied having suicidal thoughts for more than 48 hours prior to discharge.  Patient denies having homicidal thoughts.  Patient denies having auditory hallucinations.  Patient denies any visual hallucinations or other symptoms of psychosis. The patient was motivated to continue taking medication with a goal of continued improvement in mental health.   The patient reports their target psychiatric symptoms of psychosis and mania, all responded well to the psychiatric medications, and the patient reports overall benefit other psychiatric hospitalization. Supportive psychotherapy was provided to the patient. The patient also participated in regular group therapy while hospitalized. Coping skills, problem solving as well as relaxation therapies were also part of the unit programming.  Labs were  reviewed with the patient, and abnormal results were  discussed with the patient.  The patient is able to verbalize their individual safety plan to this provider.  # It is recommended to the patient to continue psychiatric medications as prescribed, after discharge from the hospital.    # It is recommended to the patient to follow up with your outpatient psychiatric provider and PCP.  # It was discussed with the patient, the impact of alcohol, drugs, tobacco have been there overall psychiatric and medical wellbeing, and total abstinence from substance use was recommended the patient.ed.  # Prescriptions provided or sent directly to preferred pharmacy at discharge. Patient agreeable to plan. Given opportunity to ask questions. Appears to feel comfortable with discharge.    # In the event of worsening symptoms, the patient is instructed to call the crisis hotline, 911 and or go to the nearest ED for appropriate evaluation and treatment of symptoms. To follow-up with primary care provider for other medical issues, concerns and or health care needs  # Patient was discharged home, with a plan to follow up as noted below. Pt offered DV shelter resources during admission.     Psychiatric Specialty Exam  Presentation  General Appearance:  Casual; Fairly Groomed  Eye Contact: Good  Speech: Normal Rate; Clear and Coherent  Speech Volume: Normal  Handedness: Right   Mood and Affect  Mood: Euthymic; Anxious  Duration of Depression Symptoms: Greater than two weeks  Affect: Appropriate; Congruent; Full Range   Thought Process  Thought Processes: Linear  Descriptions of Associations:Intact  Orientation:Full (Time, Place and Person)  Thought Content:Logical  History of Schizophrenia/Schizoaffective disorder:No  Duration of Psychotic Symptoms:Less than six months  Hallucinations:Hallucinations: None  Ideas of Reference:None  Suicidal Thoughts:Suicidal Thoughts:  No  Homicidal Thoughts:Homicidal Thoughts: No   Sensorium  Memory: Immediate Good; Recent Good; Remote Good  Judgment: Fair  Insight: Fair   Community education officer  Concentration: Fair  Attention Span: Fair  Recall: Good  Fund of Knowledge: Good  Language: Good   Psychomotor Activity  Psychomotor Activity: Psychomotor Activity: Normal   Assets  Assets: Communication Skills; Social Support   Sleep  Sleep: Sleep: Fair   Physical Exam: Physical Exam See discharge summary  ROS See discharge summary  Blood pressure 93/68, pulse (!) 102, temperature 97.6 F (36.4 C), temperature source Oral, resp. rate 16, height 5\' 3"  (1.6 m), weight 79.8 kg, SpO2 99 %. Body mass index is 31.18 kg/m.  Mental Status Per Nursing Assessment::   On Admission:  Suicidal ideation indicated by patient, Self-harm thoughts, Thoughts of violence towards others  Demographic factors: Divorced or widowed, Caucasian, Unemployed Loss Factors: Decline in physical health Historical Factors: Impulsivity Risk Reduction Factors: Living with another person, especially a relative  Continued Clinical Symptoms:  Bipolar d/o - mood is stable. Not psychotic. Denying SI, HI.   Cognitive Features That Contribute To Risk:  Closed-mindedness    Suicide Risk:  Mild:  There are no identifiable suicide plans, no associated intent, mild dysphoria and related symptoms, good self-control (both objective and subjective assessment), few other risk factors, and identifiable protective factors, including available and accessible social support.    Follow-up Information     Ree Shay, MD Follow up.   Why: Please call to schedule an appointment, if you wish to see this provider for medication management services. Contact information: [address], [place] [address]  P:  [place] Follow up.   Specialty: Behavioral Health Why:  You may go to this provider for an assessment, to obtain therapy and/or medication management services, on Monday through Friday, arrive by 7:20 am.  Assessments are provided on a first come, first served basis. Contact information: [address], [place]. Go to.   Why: Please go to this provider for a hospital follow up assessment, to obtain therapy and medication management services, Monday through Friday from 9 am to 3 pm. Contact information: [place] [id number] [phone]                 Plan Of Care/Follow-up recommendations:   Activity: as tolerated  Diet: heart healthy  Other: -Follow-up with your outpatient psychiatric provider -instructions on appointment date, time, and address (location) are provided to you in discharge paperwork.  -Take your psychiatric medications as prescribed at discharge - instructions are provided to you in the discharge paperwork  -Follow-up with outpatient primary care doctor and other specialists -for management of preventative medicine and chronic medical disease, including: RA  We made an outpatient rheumatology referral for you at discharge.   -Recommend abstinence from alcohol, tobacco, and other illicit drug use at discharge.   -If your psychiatric symptoms recur, worsen, or if you have side effects to your psychiatric medications, call your outpatient psychiatric provider, 911, [phone] or go to the nearest emergency department.  -If suicidal thoughts recur, call your outpatient psychiatric provider, 911, [phone] or go to the nearest emergency department.   Christoper Allegra, MD 12/17/2022, 8:44 AM

## 2022-12-17 NOTE — Inpatient Diabetes Management (Signed)
Inpatient Diabetes Program Recommendations  AACE/ADA: New Consensus Statement on Inpatient Glycemic Control   Target Ranges:  Prepandial:   less than 140 mg/dL      Peak postprandial:   less than 180 mg/dL (1-2 hours)      Critically ill patients:  140 - 180 mg/dL    Latest Reference Range & Units 12/16/22 06:07 12/16/22 12:04 12/16/22 17:17 12/16/22 19:43 12/17/22 05:16  Glucose-Capillary 70 - 99 mg/dL 210 (H) 219 (H) 229 (H) 222 (H) 154 (H)   Review of Glycemic Control  Diabetes history: DM2 Outpatient Diabetes medications: Metformin 500 mg BID Current orders for Inpatient glycemic control: Levemir 35 units QHS, Novolog 8 units TID with meals, Metformin XR 750 mg BID, Novolog 0-20 units TID with meals, Novolog 0-5 units QHS  Inpatient Diabetes Program Recommendations:    Insulin: Please consider increasing meal coverage to Novolog 12 units TID with meals.  Thanks, Barnie Alderman, RN, MSN, [place] Diabetes Coordinator Inpatient Diabetes Program [phone] (Team Pager from 8am to Donald)

## 2022-12-17 NOTE — Discharge Summary (Signed)
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 unit in direct patient care. The direct patient care time included face-to-face time with the patient, reviewing the patient's chart, communicating with other professionals, and coordinating care. Greater than 50% of this time was spent in counseling or coordinating care with the patient regarding goals of hospitalization, psycho-education, and discharge planning needs.   Janine Limbo, MD Psychiatrist

## 2022-12-17 NOTE — Progress Notes (Signed)
Patient is discharging at this time. Patient is A&Ox4. Vs stable. Patient denies SI,HI, and A/V/H with no plan/intent. Printed AVS reviewed with and given to patient along with medications and follow up appointments. Patient verbalized all understanding. All valuables/belongings returned to patient. Patient is being transported by her boyfriend. Patient denies any pain/discomfort. No s/s of current distress.

## 2022-12-17 NOTE — Progress Notes (Signed)
  [facility] Adult Case Management Discharge Plan :  Will you be returning to the same living situation after discharge:  Yes,  back to stay with boyfriend At discharge, do you have transportation home?: Yes,  boyfriend will pick patient ups Do you have the ability to pay for your medications: Yes,  insurance  Release of information consent forms completed and in the chart;  Patient's signature needed at discharge.  Patient to Follow up at:  Follow-up Information     Ree Shay, MD Follow up.   Why: Please call to schedule an appointment, if you wish to see this provider for medication management services. Contact information: [address], [place] [address]  P:  [facility] Follow up.   Specialty: Behavioral Health Why: You may go to this provider for an assessment, to obtain therapy and/or medication management services, on Monday through Friday, arrive by 7:20 am.  Assessments are provided on a first come, first served basis. Contact information: [address] [place], [place]. Go to.   Why: Please go to this provider for a hospital follow up assessment, to obtain therapy and medication management services, Monday through Friday from 9 am to 3 pm. Contact information: [place] [address] [phone]                 Next level of care provider has access to [place] and Suicide Prevention discussed: Yes,  boyfriend     Has patient been referred to the Quitline?: N/A patient is not a smoker  Patient has been referred for addiction treatment: [place], [name] 12/17/2022, 10:12 AM

## 2022-12-17 NOTE — [facility] Counselor (Signed)
[facility] [name] Progress Note   12/17/2022    10:10 AM  Erika Rhodes   [id number]   Type of Contact and Topic:  DV resources   CSW provided patient with list of DV resources to access if needed.  Patient appreciative of resources.     Signed:  Riki Altes MSW, [name], LCAS 12/17/2022 10:10 AM

## 2022-12-17 NOTE — Progress Notes (Signed)
Adult Psychoeducational Group Note  Date:  12/17/2022 Time:  09:30 am  Group Topic/Focus:  Goals Group:   The focus of this group is to help patients establish daily goals to achieve during treatment and discuss how the patient can incorporate goal setting into their daily lives to aide in recovery.  Participation Level:  Active  Participation Quality:  Appropriate  Affect:  Appropriate  Cognitive:  Appropriate  Insight: Appropriate  Engagement in Group:  Engaged  Modes of Intervention:  Activity  Additional Comments:   Pt attended and participated in the morning goals group.  Erika Rhodes 12/17/2022, 10:59 AM

## 2023-01-06 ENCOUNTER — Encounter (HOSPITAL_BASED_OUTPATIENT_CLINIC_OR_DEPARTMENT_OTHER): Payer: Self-pay

## 2023-01-06 ENCOUNTER — Other Ambulatory Visit: Payer: Self-pay

## 2023-01-06 ENCOUNTER — Emergency Department (HOSPITAL_BASED_OUTPATIENT_CLINIC_OR_DEPARTMENT_OTHER)
Admission: EM | Admit: 2023-01-06 | Discharge: 2023-01-06 | Disposition: A | Discharge status: Home / Self Care | Payer: Medicaid Other | Attending: Emergency Medicine | Admitting: Emergency Medicine

## 2023-01-06 DIAGNOSIS — J069 Acute upper respiratory infection, unspecified: Secondary | ICD-10-CM

## 2023-01-06 DIAGNOSIS — R059 Cough, unspecified: Secondary | ICD-10-CM | POA: Diagnosis present

## 2023-01-06 DIAGNOSIS — Z7982 Long term (current) use of aspirin: Secondary | ICD-10-CM | POA: Diagnosis not present

## 2023-01-06 DIAGNOSIS — Z794 Long term (current) use of insulin: Secondary | ICD-10-CM | POA: Diagnosis not present

## 2023-01-06 MED ORDER — PREDNISONE 20 MG PO TABS: ORAL_TABLET | ORAL | 0 refills | Status: DC

## 2023-01-06 MED ORDER — PREDNISONE 50 MG PO TABS
60.0000 mg | ORAL_TABLET | Freq: Once | ORAL | Status: AC
  Administered 2023-01-06: 60 mg via ORAL
  Filled 2023-01-06: qty 1

## 2023-01-06 MED ORDER — BENZONATATE 100 MG PO CAPS: 100.0000 mg | ORAL_CAPSULE | Freq: Three times a day (TID) | ORAL | 0 refills | Status: DC

## 2023-01-06 MED ORDER — ONDANSETRON 4 MG PO TBDP: ORAL_TABLET | ORAL | 0 refills | Status: DC

## 2023-01-06 NOTE — ED Notes (Signed)
Cannot discharge, registration in chart.

## 2023-01-06 NOTE — ED Notes (Signed)
Discharge paperwork reviewed entirely with patient, including Rx's and follow up care. Pain was under control. Pt verbalized understanding as well as all parties involved. No questions or concerns voiced at the time of discharge. No acute distress noted.   Pt ambulated out to PVA without incident or assistance.

## 2023-01-06 NOTE — ED Provider Notes (Signed)
[place] [facility] Provider Note   CSN: [id number] Arrival date & time: 01/06/23  1043     History  Chief Complaint  Patient presents with   Cough    Erika Rhodes is a 42 y.o. female.  42 yo F with a chief complaints of cough.  This has been going on for about a week now.  Her significant other also has similar symptoms.  She has a history of rheumatoid arthritis and feels like she is having a flare.  Typically when this occurs she needs a burst of steroids.   Cough      Home Medications Prior to Admission medications   Medication Sig Start Date End Date Taking? Authorizing Provider  benzonatate (TESSALON) 100 MG capsule Take 1 capsule (100 mg total) by mouth every 8 (eight) hours. 01/06/23  Yes Deno Etienne, DO  ondansetron (ZOFRAN-ODT) 4 MG disintegrating tablet '4mg'$  ODT q4 hours prn nausea/vomit 01/06/23  Yes Deno Etienne, DO  predniSONE (DELTASONE) 20 MG tablet 2 tabs po daily x 4 days 01/06/23  Yes Deno Etienne, DO  albuterol (PROVENTIL) (2.5 MG/3ML) 0.083% nebulizer solution Take 3 mLs (2.5 mg total) by nebulization every 6 (six) hours as needed for wheezing or shortness of breath. Patient taking differently: Take 2.5 mg by nebulization as needed for wheezing or shortness of breath. 07/26/22   Tonye Pearson, PA-C  albuterol (VENTOLIN HFA) 108 (90 Base) MCG/ACT inhaler Inhale 1-2 puffs into the lungs every 4 (four) hours as needed for wheezing or shortness of breath. Patient not taking: Reported on 12/10/2022 01/15/21   Ethelene Hal, NP  Aspirin-Acetaminophen-Caffeine (GOODY HEADACHE PO) Take 1 packet by mouth as needed (headache,pain).    [provider]  benztropine (COGENTIN) 0.5 MG tablet Take 1 tablet (0.5 mg total) by mouth daily. 12/18/22 01/17/23  Janine Limbo, MD  blood glucose meter kit and supplies Dispense based on patient and insurance preference. Use up to four times daily as directed. (FOR ICD-10 E10.9, E11.9).  12/17/22   Massengill, Ovid Curd, MD  clindamycin (CLEOCIN T) 1 % lotion Apply 1 application  topically daily as needed (breakouts). To affected area on face    [provider]  diazepam (VALIUM) 5 MG tablet Take 1 tablet (5 mg total) by mouth every 6 (six) hours as needed for anxiety. Patient taking differently: Take 5 mg by mouth 2 (two) times daily. 08/01/22   Molpus, John, MD  docusate sodium (COLACE) 100 MG capsule Take 1 capsule (100 mg total) by mouth every 12 (twelve) hours. 12/17/22   Massengill, Ovid Curd, MD  Fexofenadine HCl (ALLEGRA PO) Take 1 tablet by mouth daily.    [provider]  fluticasone (FLONASE) 50 MCG/ACT nasal spray Place 1 spray into both nostrils daily. 12/18/22   Massengill, Ovid Curd, MD  gabapentin (NEURONTIN) 400 MG capsule Take 1 capsule (400 mg total) by mouth 3 (three) times daily. 12/17/22 01/16/23  Massengill, Ovid Curd, MD  ibuprofen (ADVIL) 200 MG tablet Take 800 mg by mouth as needed for mild pain or moderate pain.    [provider]  insulin aspart (NOVOLOG) 100 UNIT/ML injection Inject 0-20 Units into the skin 3 (three) times daily with meals. CBG 70 - 120: 0 units.CBG 121 - 150: 3 units.CBG 151 - 200: 4 units.CBG 201 - 250: 7 units.CBG 251 - 300: 11 units.CBG 301 - 350: 15 units.CBG 351 - 400: 20 units.CBG > 400 call 911 12/17/22   Massengill, Ovid Curd, MD  insulin aspart (NOVOLOG)  100 UNIT/ML injection Inject 0-5 Units into the skin at bedtime. CBG 70 - 120: 0 units. CBG 121 - 150: 0 units.CBG 151 - 200: 0 units.CBG 201 - 250: 2 units.CBG 251 - 300: 3 units.CBG 301 - 350: 4 units.CBG 351 - 400: 5 units.CBG > 400 call 911 12/17/22   Massengill, Ovid Curd, MD  insulin aspart (NOVOLOG) 100 UNIT/ML injection Inject 8 Units into the skin 3 (three) times daily with meals. 12/17/22   Massengill, Ovid Curd, MD  insulin detemir (LEVEMIR) 100 UNIT/ML injection Inject 0.35 mLs (35 Units total) into the skin at bedtime. 12/17/22   Massengill, Ovid Curd, MD  metFORMIN (GLUCOPHAGE-XR)  750 MG 24 hr tablet Take 1 tablet (750 mg total) by mouth 2 (two) times daily with a meal. 12/17/22 01/16/23  Massengill, Ovid Curd, MD  nicotine (NICODERM CQ - DOSED IN MG/24 HOURS) 14 mg/24hr patch Place 1 patch (14 mg total) onto the skin daily. 12/18/22   Massengill, Ovid Curd, MD  nicotine polacrilex (NICORETTE) 2 MG gum Take 1 each (2 mg total) by mouth as needed for smoking cessation. 12/17/22   Massengill, Ovid Curd, MD  omeprazole (PRILOSEC OTC) 20 MG tablet Take 20 mg by mouth daily.    [provider]  paliperidone (INVEGA) 6 MG 24 hr tablet Take 1 tablet (6 mg total) by mouth at bedtime. 12/17/22 01/16/23  Massengill, Ovid Curd, MD  traZODone (DESYREL) 50 MG tablet Take 1 tablet (50 mg total) by mouth at bedtime as needed for sleep. 12/17/22 01/16/23  Massengill, Ovid Curd, MD      Allergies    Z-pak [azithromycin], Geodon [ziprasidone], Haldol [haloperidol], Olanzapine, Rexulti [brexpiprazole], and Risperidone and related    Review of Systems   Review of Systems  Respiratory:  Positive for cough.     Physical Exam Updated Vital Signs BP (!) 118/91   Pulse 82   Temp 98 F (36.7 C) (Oral)   Ht '5\' 3"'$  (1.6 m)   Wt 80.3 kg   SpO2 100%   BMI 31.35 kg/m  Physical Exam Vitals and nursing note reviewed.  Constitutional:      General: She is not in acute distress.    Appearance: She is well-developed. She is not diaphoretic.  HENT:     Head: Normocephalic and atraumatic.     Comments: Swollen turbinates, posterior nasal drip, vesicles noted to the posterior oropharynx, no noted sinus ttp, tm normal bilaterally.   Eyes:     Pupils: Pupils are equal, round, and reactive to light.  Cardiovascular:     Rate and Rhythm: Normal rate and regular rhythm.     Heart sounds: No murmur heard.    No friction rub. No gallop.  Pulmonary:     Effort: Pulmonary effort is normal.     Breath sounds: No wheezing or rales.  Abdominal:     General: There is no distension.     Palpations: Abdomen is soft.      Tenderness: There is no abdominal tenderness.  Musculoskeletal:        General: No tenderness.     Cervical back: Normal range of motion and neck supple.  Skin:    General: Skin is warm and dry.  Neurological:     Mental Status: She is alert and oriented to person, place, and time.  Psychiatric:        Behavior: Behavior normal.     ED Results / Procedures / Treatments   Labs (all labs ordered are listed, but only abnormal results are displayed) Labs Reviewed -  No data to display  EKG None  Radiology No results found.  Procedures Procedures    Medications Ordered in ED Medications  predniSONE (DELTASONE) tablet 60 mg (60 mg Oral Given 01/06/23 1114)    ED Course/ Medical Decision Making/ A&P                             Medical Decision Making Risk Prescription drug management.   42 yo F with a chief complaints of cough and congestion.  This has been going on for about a week.  Has a close contact with similar illness.  She has clear lung sounds for me.  No obvious bacterial source was found on exam.  She has vesicles to the posterior oropharynx most consistent with a viral syndrome.  No tachypnea.  Do not feel she would benefit from chest imaging at this time.  Patient feels like she is having a rheumatoid arthritis flare.  She is requesting steroids.  Will do a burst of steroids here.  Encouraged her to follow-up with her rheumatologist.  11:39 AM:  I have discussed the diagnosis/risks/treatment options with the patient.  Evaluation and diagnostic testing in the emergency department does not suggest an emergent condition requiring admission or immediate intervention beyond what has been performed at this time.  They will follow up with PCP, rheum. We also discussed returning to the ED immediately if new or worsening sx occur. We discussed the sx which are most concerning (e.g., sudden worsening pain, fever, inability to tolerate by mouth) that necessitate immediate return.  Medications administered to the patient during their visit and any new prescriptions provided to the patient are listed below.  Medications given during this visit Medications  predniSONE (DELTASONE) tablet 60 mg (60 mg Oral Given 01/06/23 1114)     The patient appears reasonably screen and/or stabilized for discharge and I doubt any other medical condition or other [facility] requiring further screening, evaluation, or treatment in the ED at this time prior to discharge.          Final Clinical Impression(s) / ED Diagnoses Final diagnoses:  Viral URI with cough    Rx / DC Orders ED Discharge Orders          Ordered    benzonatate (TESSALON) 100 MG capsule  Every 8 hours        01/06/23 1108    ondansetron (ZOFRAN-ODT) 4 MG disintegrating tablet        01/06/23 1108    predniSONE (DELTASONE) 20 MG tablet        01/06/23 1108              Deno Etienne, DO 01/06/23 1139

## 2023-01-06 NOTE — ED Triage Notes (Signed)
"  Feel like I am having a RA flare up because my joints hurt and I have been coughing all night" per pt

## 2023-01-06 NOTE — Discharge Instructions (Addendum)
Take tylenol 2 pills 4 times a day.  Drink plenty of fluids.  Return for worsening shortness of breath, headache, confusion. Follow up with your family doctor.     

## 2023-01-11 ENCOUNTER — Emergency Department (HOSPITAL_BASED_OUTPATIENT_CLINIC_OR_DEPARTMENT_OTHER)
Admission: EM | Admit: 2023-01-11 | Discharge: 2023-01-11 | Disposition: A | Discharge status: Home / Self Care | Payer: Medicaid Other | Attending: Emergency Medicine | Admitting: Emergency Medicine

## 2023-01-11 ENCOUNTER — Emergency Department (HOSPITAL_BASED_OUTPATIENT_CLINIC_OR_DEPARTMENT_OTHER): Payer: Medicaid Other

## 2023-01-11 ENCOUNTER — Encounter (HOSPITAL_BASED_OUTPATIENT_CLINIC_OR_DEPARTMENT_OTHER): Payer: Self-pay | Admitting: Urology

## 2023-01-11 ENCOUNTER — Other Ambulatory Visit: Payer: Self-pay

## 2023-01-11 DIAGNOSIS — R0602 Shortness of breath: Secondary | ICD-10-CM | POA: Diagnosis not present

## 2023-01-11 DIAGNOSIS — Z7951 Long term (current) use of inhaled steroids: Secondary | ICD-10-CM | POA: Diagnosis not present

## 2023-01-11 DIAGNOSIS — R059 Cough, unspecified: Secondary | ICD-10-CM | POA: Insufficient documentation

## 2023-01-11 DIAGNOSIS — Z20822 Contact with and (suspected) exposure to covid-19: Secondary | ICD-10-CM | POA: Insufficient documentation

## 2023-01-11 DIAGNOSIS — J45909 Unspecified asthma, uncomplicated: Secondary | ICD-10-CM | POA: Diagnosis not present

## 2023-01-11 DIAGNOSIS — Z794 Long term (current) use of insulin: Secondary | ICD-10-CM | POA: Insufficient documentation

## 2023-01-11 LAB — RESP PANEL BY RT-PCR (RSV, FLU A&B, COVID)  RVPGX2
Influenza A by PCR: NEGATIVE
Influenza B by PCR: NEGATIVE
Resp Syncytial Virus by PCR: NEGATIVE
SARS Coronavirus 2 by RT PCR: NEGATIVE

## 2023-01-11 MED ORDER — PREDNISONE 10 MG PO TABS: 10.0000 mg | ORAL_TABLET | Freq: Every day | ORAL | 0 refills | Status: AC

## 2023-01-11 MED ORDER — ALBUTEROL SULFATE HFA 108 (90 BASE) MCG/ACT IN AERS
2.0000 | INHALATION_SPRAY | Freq: Once | RESPIRATORY_TRACT | Status: AC
  Administered 2023-01-11: 2 via RESPIRATORY_TRACT
  Filled 2023-01-11: qty 6.7

## 2023-01-11 MED ORDER — PREDNISONE 10 MG PO TABS
10.0000 mg | ORAL_TABLET | Freq: Once | ORAL | Status: AC
  Administered 2023-01-11: 10 mg via ORAL
  Filled 2023-01-11: qty 1

## 2023-01-11 MED ORDER — ALBUTEROL SULFATE HFA 108 (90 BASE) MCG/ACT IN AERS: 1.0000 | INHALATION_SPRAY | Freq: Four times a day (QID) | RESPIRATORY_TRACT | 10 refills | Status: AC | PRN

## 2023-01-11 NOTE — Discharge Instructions (Signed)
Evaluation for your cough is overall reassuring.  You were negative for COVID, flu and RSV.  Could be mild asthma exacerbation versus vaping associated lung injury.  Advised to consider discontinuing vaping.  Also recommend he follow-up with your PCP for ongoing management of your asthma.  I sent a 5-day course of prednisone to your pharmacy along with refills of your albuterol.  If you have worsening shortness of breath, new fever, chest pain or any other concerning symptom please return emergency department for further evaluation.

## 2023-01-11 NOTE — ED Triage Notes (Signed)
Pt states was here 5-6 days ago for SOB and was on prednisone  States continued cough  Tessalon pearls given at prior visit are helping States continued post nasal drip and sore throat   Requesting antibiotics

## 2023-01-11 NOTE — ED Provider Notes (Signed)
[facility] Provider Note   CSN: [id number] Arrival date & time: 01/11/23  [id number]     History  Chief Complaint  Patient presents with   Cough   HPI Erika Rhodes is a 42 y.o. female with asthma, MDD, and vape user presenting for cough.  States she has had a persistent cough for the past 6 days.  Also endorses some shortness of breath.  Denies chest pain.  States she was seen for this complaint on February 28 and was prescribed prednisone and Tessalon Perles which have helped her symptoms states she finished her prednisone course yesterday and feels her cough is getting worse.  Patient also has history of rheumatoid arthritis states she is having a flare.  Patient also mentioned that she has been vaping for a year now.  States she takes "100 puffs/day".  Also requesting albuterol inhaler and spacer.   Cough      Home Medications Prior to Admission medications   Medication Sig Start Date End Date Taking? Authorizing Provider  albuterol (VENTOLIN HFA) 108 (90 Base) MCG/ACT inhaler Inhale 1-2 puffs into the lungs every 6 (six) hours as needed for wheezing or shortness of breath. 01/11/23  Yes Harriet Pho, PA-C  predniSONE (DELTASONE) 10 MG tablet Take 1 tablet (10 mg total) by mouth daily for 5 days. 01/11/23 01/16/23 Yes Harriet Pho, PA-C  Aspirin-Acetaminophen-Caffeine (GOODY HEADACHE PO) Take 1 packet by mouth as needed (headache,pain).    [provider]  benzonatate (TESSALON) 100 MG capsule Take 1 capsule (100 mg total) by mouth every 8 (eight) hours. 01/06/23   Deno Etienne, DO  benztropine (COGENTIN) 0.5 MG tablet Take 1 tablet (0.5 mg total) by mouth daily. 12/18/22 01/17/23  Janine Limbo, MD  blood glucose meter kit and supplies Dispense based on patient and insurance preference. Use up to four times daily as directed. (FOR ICD-10 E10.9, E11.9). 12/17/22   Massengill, Ovid Curd, MD  clindamycin (CLEOCIN T) 1 % lotion Apply 1  application  topically daily as needed (breakouts). To affected area on face    [provider]  diazepam (VALIUM) 5 MG tablet Take 1 tablet (5 mg total) by mouth every 6 (six) hours as needed for anxiety. Patient taking differently: Take 5 mg by mouth 2 (two) times daily. 08/01/22   Molpus, Shad Ledvina, MD  docusate sodium (COLACE) 100 MG capsule Take 1 capsule (100 mg total) by mouth every 12 (twelve) hours. 12/17/22   Massengill, Ovid Curd, MD  Fexofenadine HCl (ALLEGRA PO) Take 1 tablet by mouth daily.    [provider]  fluticasone (FLONASE) 50 MCG/ACT nasal spray Place 1 spray into both nostrils daily. 12/18/22   Massengill, Ovid Curd, MD  gabapentin (NEURONTIN) 400 MG capsule Take 1 capsule (400 mg total) by mouth 3 (three) times daily. 12/17/22 01/16/23  Massengill, Ovid Curd, MD  ibuprofen (ADVIL) 200 MG tablet Take 800 mg by mouth as needed for mild pain or moderate pain.    [provider]  insulin aspart (NOVOLOG) 100 UNIT/ML injection Inject 0-20 Units into the skin 3 (three) times daily with meals. CBG 70 - 120: 0 units.CBG 121 - 150: 3 units.CBG 151 - 200: 4 units.CBG 201 - 250: 7 units.CBG 251 - 300: 11 units.CBG 301 - 350: 15 units.CBG 351 - 400: 20 units.CBG > 400 call 911 12/17/22   Massengill, Ovid Curd, MD  insulin aspart (NOVOLOG) 100 UNIT/ML injection Inject 0-5 Units into the skin at bedtime. CBG 70 - 120: 0  units. CBG 121 - 150: 0 units.CBG 151 - 200: 0 units.CBG 201 - 250: 2 units.CBG 251 - 300: 3 units.CBG 301 - 350: 4 units.CBG 351 - 400: 5 units.CBG > 400 call 911 12/17/22   Massengill, Ovid Curd, MD  insulin aspart (NOVOLOG) 100 UNIT/ML injection Inject 8 Units into the skin 3 (three) times daily with meals. 12/17/22   Massengill, Ovid Curd, MD  insulin detemir (LEVEMIR) 100 UNIT/ML injection Inject 0.35 mLs (35 Units total) into the skin at bedtime. 12/17/22   Massengill, Ovid Curd, MD  metFORMIN (GLUCOPHAGE-XR) 750 MG 24 hr tablet Take 1 tablet (750 mg total) by mouth 2 (two) times daily  with a meal. 12/17/22 01/16/23  Massengill, Ovid Curd, MD  nicotine (NICODERM CQ - DOSED IN MG/24 HOURS) 14 mg/24hr patch Place 1 patch (14 mg total) onto the skin daily. 12/18/22   Massengill, Ovid Curd, MD  nicotine polacrilex (NICORETTE) 2 MG gum Take 1 each (2 mg total) by mouth as needed for smoking cessation. 12/17/22   Massengill, Ovid Curd, MD  omeprazole (PRILOSEC OTC) 20 MG tablet Take 20 mg by mouth daily.    [provider]  ondansetron (ZOFRAN-ODT) 4 MG disintegrating tablet '4mg'$  ODT q4 hours prn nausea/vomit 01/06/23   Deno Etienne, DO  paliperidone (INVEGA) 6 MG 24 hr tablet Take 1 tablet (6 mg total) by mouth at bedtime. 12/17/22 01/16/23  Massengill, Ovid Curd, MD  traZODone (DESYREL) 50 MG tablet Take 1 tablet (50 mg total) by mouth at bedtime as needed for sleep. 12/17/22 01/16/23  Massengill, Ovid Curd, MD      Allergies    Z-pak [azithromycin], Geodon [ziprasidone], Haldol [haloperidol], Olanzapine, Rexulti [brexpiprazole], and Risperidone and related    Review of Systems   Review of Systems  Respiratory:  Positive for cough.     Physical Exam   Vitals:   01/11/23 1018  BP: 104/70  Pulse: 91  Resp: 17  Temp: 97.8 F (36.6 C)  SpO2: 100%    CONSTITUTIONAL:  well-appearing, NAD NEURO:  Alert and oriented x 3, CN 3-12 grossly intact EYES:  eyes equal and reactive ENT/NECK:  Supple, no stridor  CARDIO:  regular rate and rhythm, appears well-perfused  PULM:  No respiratory distress, CTAB, no wheezing, rhonchi or rales GI/GU:  non-distended, soft MSK/SPINE:  No gross deformities, no edema, moves all extremities  SKIN:  no rash, atraumatic   *Additional and/or pertinent findings included in MDM below    ED Results / Procedures / Treatments   Labs (all labs ordered are listed, but only abnormal results are displayed) Labs Reviewed  RESP PANEL BY RT-PCR (RSV, FLU A&B, COVID)  [id number]    EKG None  Radiology DG Chest 2 View  Result Date: 01/11/2023 CLINICAL DATA:  Cough,  shortness of breath. EXAM: CHEST - 2 VIEW COMPARISON:  October 24, 2015. FINDINGS: The heart size and mediastinal contours are within normal limits. Both lungs are clear. The visualized skeletal structures are unremarkable. IMPRESSION: No active cardiopulmonary disease. Electronically Signed   By: Marijo Conception M.D.   On: 01/11/2023 10:37    Procedures Procedures    Medications Ordered in ED Medications  predniSONE (DELTASONE) tablet 10 mg (has no administration in time range)  albuterol (VENTOLIN HFA) 108 (90 Base) MCG/ACT inhaler 2 puff (2 puffs Inhalation Given 01/11/23 1207)    ED Course/ Medical Decision Making/ A&P  Medical Decision Making Amount and/or Complexity of Data Reviewed Radiology: ordered.   42 year old who is well-appearing and hemodynamically stable presenting for cough.  Exam is unremarkable.  DDx includes asthma exacerbation, URI, vape associated lung injury, pneumonia.  Overall patient appears very clinically well.  Have low suspicion for asthma exacerbation but treated with prednisone and albuterol and provided patient with spacer as well.  Also sent albuterol refills to her pharmacy.  Sent prednisone to her pharmacy.  Advised her to follow-up with her PCP.  Discussed return precautions.  Also discussed that symptoms could be related to her daily vaping.  Advised to consider smoking cessation.        Final Clinical Impression(s) / ED Diagnoses Final diagnoses:  Cough, unspecified type    Rx / DC Orders ED Discharge Orders          Ordered    predniSONE (DELTASONE) 10 MG tablet  Daily        01/11/23 1204    albuterol (VENTOLIN HFA) 108 (90 Base) MCG/ACT inhaler  Every 6 hours PRN        01/11/23 1208              Harriet Pho, PA-C 01/11/23 1208    Leanord Asal K, DO 01/11/23 [phone]

## 2023-02-21 ENCOUNTER — Emergency Department (HOSPITAL_BASED_OUTPATIENT_CLINIC_OR_DEPARTMENT_OTHER)
Admission: EM | Admit: 2023-02-21 | Discharge: 2023-02-21 | Disposition: A | Discharge status: Home / Self Care | Payer: Medicaid Other | Attending: Emergency Medicine | Admitting: Emergency Medicine

## 2023-02-21 DIAGNOSIS — Z7984 Long term (current) use of oral hypoglycemic drugs: Secondary | ICD-10-CM | POA: Insufficient documentation

## 2023-02-21 DIAGNOSIS — K0889 Other specified disorders of teeth and supporting structures: Secondary | ICD-10-CM | POA: Diagnosis not present

## 2023-02-21 DIAGNOSIS — Z7982 Long term (current) use of aspirin: Secondary | ICD-10-CM | POA: Diagnosis not present

## 2023-02-21 DIAGNOSIS — Z794 Long term (current) use of insulin: Secondary | ICD-10-CM | POA: Insufficient documentation

## 2023-02-21 MED ORDER — AMOXICILLIN 500 MG PO CAPS: 500.0000 mg | ORAL_CAPSULE | Freq: Two times a day (BID) | ORAL | 0 refills | Status: AC

## 2023-02-21 NOTE — ED Notes (Signed)
Reviewed discharge instructions and medication with pt. Pt states understanding

## 2023-02-21 NOTE — ED Notes (Signed)
EDP in triage to assess pt 

## 2023-02-21 NOTE — ED Triage Notes (Addendum)
Pt reports upper right dental pain x 2 days. Pt has dentist appt in 4 days

## 2023-02-21 NOTE — ED Provider Notes (Signed)
[facility] Provider Note   CSN: [id number] Arrival date & time: 02/21/23  1629     History  Chief Complaint  Patient presents with   Dental Pain    Erika Rhodes is a 42 y.o. female.  Right lower dental pain.  She is having most of her teeth removed currently to possibly get partials.  She is supposed to maybe get a root canal in the right lower area here soon.  Feeling seems of gotten loose and she is having pain.  No swelling.  No trouble eating or drinking.  The history is provided by the patient.       Home Medications Prior to Admission medications   Medication Sig Start Date End Date Taking? Authorizing Provider  amoxicillin (AMOXIL) 500 MG capsule Take 1 capsule (500 mg total) by mouth 2 (two) times daily for 7 days. 02/21/23 02/28/23 Yes Rolf Fells, DO  albuterol (VENTOLIN HFA) 108 (90 Base) MCG/ACT inhaler Inhale 1-2 puffs into the lungs every 6 (six) hours as needed for wheezing or shortness of breath. 01/11/23   Gareth Eagle, PA-C  Aspirin-Acetaminophen-Caffeine (GOODY HEADACHE PO) Take 1 packet by mouth as needed (headache,pain).    [provider]  benzonatate (TESSALON) 100 MG capsule Take 1 capsule (100 mg total) by mouth every 8 (eight) hours. 01/06/23   Melene Plan, DO  benztropine (COGENTIN) 0.5 MG tablet Take 1 tablet (0.5 mg total) by mouth daily. 12/18/22 01/17/23  Phineas Inches, MD  blood glucose meter kit and supplies Dispense based on patient and insurance preference. Use up to four times daily as directed. (FOR ICD-10 E10.9, E11.9). 12/17/22   Massengill, Harrold Donath, MD  clindamycin (CLEOCIN T) 1 % lotion Apply 1 application  topically daily as needed (breakouts). To affected area on face    [provider]  diazepam (VALIUM) 5 MG tablet Take 1 tablet (5 mg total) by mouth every 6 (six) hours as needed for anxiety. Patient taking differently: Take 5 mg by mouth 2 (two) times daily. 08/01/22    Molpus, John, MD  docusate sodium (COLACE) 100 MG capsule Take 1 capsule (100 mg total) by mouth every 12 (twelve) hours. 12/17/22   Massengill, Harrold Donath, MD  Fexofenadine HCl (ALLEGRA PO) Take 1 tablet by mouth daily.    [provider]  fluticasone (FLONASE) 50 MCG/ACT nasal spray Place 1 spray into both nostrils daily. 12/18/22   Massengill, Harrold Donath, MD  gabapentin (NEURONTIN) 400 MG capsule Take 1 capsule (400 mg total) by mouth 3 (three) times daily. 12/17/22 01/16/23  Massengill, Harrold Donath, MD  ibuprofen (ADVIL) 200 MG tablet Take 800 mg by mouth as needed for mild pain or moderate pain.    [provider]  insulin aspart (NOVOLOG) 100 UNIT/ML injection Inject 0-20 Units into the skin 3 (three) times daily with meals. CBG 70 - 120: 0 units.CBG 121 - 150: 3 units.CBG 151 - 200: 4 units.CBG 201 - 250: 7 units.CBG 251 - 300: 11 units.CBG 301 - 350: 15 units.CBG 351 - 400: 20 units.CBG > 400 call 911 12/17/22   Massengill, Harrold Donath, MD  insulin aspart (NOVOLOG) 100 UNIT/ML injection Inject 0-5 Units into the skin at bedtime. CBG 70 - 120: 0 units. CBG 121 - 150: 0 units.CBG 151 - 200: 0 units.CBG 201 - 250: 2 units.CBG 251 - 300: 3 units.CBG 301 - 350: 4 units.CBG 351 - 400: 5 units.CBG > 400 call 911 12/17/22   Phineas Inches, MD  insulin aspart (  NOVOLOG) 100 UNIT/ML injection Inject 8 Units into the skin 3 (three) times daily with meals. 12/17/22   Massengill, Harrold Donath, MD  insulin detemir (LEVEMIR) 100 UNIT/ML injection Inject 0.35 mLs (35 Units total) into the skin at bedtime. 12/17/22   Massengill, Harrold Donath, MD  metFORMIN (GLUCOPHAGE-XR) 750 MG 24 hr tablet Take 1 tablet (750 mg total) by mouth 2 (two) times daily with a meal. 12/17/22 01/16/23  Massengill, Harrold Donath, MD  nicotine (NICODERM CQ - DOSED IN MG/24 HOURS) 14 mg/24hr patch Place 1 patch (14 mg total) onto the skin daily. 12/18/22   Massengill, Harrold Donath, MD  nicotine polacrilex (NICORETTE) 2 MG gum Take 1 each (2 mg total) by mouth as needed for smoking  cessation. 12/17/22   Massengill, Harrold Donath, MD  omeprazole (PRILOSEC OTC) 20 MG tablet Take 20 mg by mouth daily.    [provider]  ondansetron (ZOFRAN-ODT) 4 MG disintegrating tablet 4mg  ODT q4 hours prn nausea/vomit 01/06/23   Melene Plan, DO  paliperidone (INVEGA) 6 MG 24 hr tablet Take 1 tablet (6 mg total) by mouth at bedtime. 12/17/22 01/16/23  Massengill, Harrold Donath, MD  traZODone (DESYREL) 50 MG tablet Take 1 tablet (50 mg total) by mouth at bedtime as needed for sleep. 12/17/22 01/16/23  Massengill, Harrold Donath, MD      Allergies    Z-pak [azithromycin], Geodon [ziprasidone], Haldol [haloperidol], Olanzapine, Rexulti [brexpiprazole], and Risperidone and related    Review of Systems   Review of Systems  Physical Exam Updated Vital Signs BP 118/73 (BP Location: Left Arm)   Pulse 99   Temp 98.3 F (36.8 C) (Oral)   Resp 18   SpO2 95%  Physical Exam HENT:     Head:     Comments: No major facial swelling or submandibular swelling, no signs of abscess on the mouth, area of dental carry in the right lower tooth    Nose: Nose normal.  Neurological:     Mental Status: She is alert.     ED Results / Procedures / Treatments   Labs (all labs ordered are listed, but only abnormal results are displayed) Labs Reviewed - No data to display  EKG None  Radiology No results found.  Procedures Procedures    Medications Ordered in ED Medications - No data to display  ED Course/ Medical Decision Making/ A&P                             Medical Decision Making Risk Prescription drug management.   Erika Rhodes is here with dental pain.  No concern for deep space infectious process.  No facial swelling or submandibular swelling.  I suspect that this is the nerve root related pain.  Will prescribe antibiotics.  She has follow-up with dentist in place.  Discharged in good condition.  This chart was dictated using voice recognition software.  Despite best efforts to proofread,  errors  can occur which can change the documentation meaning.         Final Clinical Impression(s) / ED Diagnoses Final diagnoses:  Pain, dental    Rx / DC Orders ED Discharge Orders          Ordered    amoxicillin (AMOXIL) 500 MG capsule  2 times daily        02/21/23 1656              Virgina Norfolk, DO 02/21/23 1658

## 2023-08-04 ENCOUNTER — Encounter (HOSPITAL_BASED_OUTPATIENT_CLINIC_OR_DEPARTMENT_OTHER): Payer: Self-pay

## 2023-08-04 ENCOUNTER — Other Ambulatory Visit: Payer: Self-pay

## 2023-08-04 DIAGNOSIS — J45909 Unspecified asthma, uncomplicated: Secondary | ICD-10-CM | POA: Insufficient documentation

## 2023-08-04 DIAGNOSIS — E119 Type 2 diabetes mellitus without complications: Secondary | ICD-10-CM | POA: Insufficient documentation

## 2023-08-04 DIAGNOSIS — R21 Rash and other nonspecific skin eruption: Secondary | ICD-10-CM | POA: Diagnosis present

## 2023-08-04 DIAGNOSIS — R238 Other skin changes: Secondary | ICD-10-CM | POA: Diagnosis not present

## 2023-08-04 LAB — CBC WITH DIFFERENTIAL/PLATELET
Abs Immature Granulocytes: 0.01 10*3/uL (ref 0.00–0.07)
Basophils Absolute: 0 10*3/uL (ref 0.0–0.1)
Basophils Relative: 0 %
Eosinophils Absolute: 0.1 10*3/uL (ref 0.0–0.5)
Eosinophils Relative: 1 %
HCT: 37.7 % (ref 36.0–46.0)
Hemoglobin: 13.7 g/dL (ref 12.0–15.0)
Immature Granulocytes: 0 %
Lymphocytes Relative: 19 %
Lymphs Abs: 1.4 10*3/uL (ref 0.7–4.0)
MCH: 30.6 pg (ref 26.0–34.0)
MCHC: 36.3 g/dL — ABNORMAL HIGH (ref 30.0–36.0)
MCV: 84.3 fL (ref 80.0–100.0)
Monocytes Absolute: 0.4 10*3/uL (ref 0.1–1.0)
Monocytes Relative: 5 %
Neutro Abs: 5.7 10*3/uL (ref 1.7–7.7)
Neutrophils Relative %: 75 %
Platelets: 272 10*3/uL (ref 150–400)
RBC: 4.47 MIL/uL (ref 3.87–5.11)
RDW: 12.1 % (ref 11.5–15.5)
WBC: 7.6 10*3/uL (ref 4.0–10.5)
nRBC: 0 % (ref 0.0–0.2)

## 2023-08-04 LAB — BASIC METABOLIC PANEL
Anion gap: 13 (ref 5–15)
BUN: 10 mg/dL (ref 6–20)
CO2: 22 mmol/L (ref 22–32)
Calcium: 8.6 mg/dL — ABNORMAL LOW (ref 8.9–10.3)
Chloride: 95 mmol/L — ABNORMAL LOW (ref 98–111)
Creatinine, Ser: 0.86 mg/dL (ref 0.44–1.00)
GFR, Estimated: >60 mL/min (ref >60)
Glucose, Bld: 392 mg/dL — ABNORMAL HIGH (ref 70–99)
Potassium: 3 mmol/L — ABNORMAL LOW (ref 3.5–5.1)
Sodium: 130 mmol/L — ABNORMAL LOW (ref 135–145)

## 2023-08-04 NOTE — ED Triage Notes (Signed)
Pt states she noticed lg blisters under left axilla, now they are open ulcerations, draining purulent drainage . Pt has hx of MRSA Fine rash & tiny blisters noticed on chest and under breast

## 2023-08-05 ENCOUNTER — Emergency Department (HOSPITAL_BASED_OUTPATIENT_CLINIC_OR_DEPARTMENT_OTHER)
Admission: EM | Admit: 2023-08-05 | Discharge: 2023-08-05 | Disposition: A | Discharge status: Home / Self Care | Payer: MEDICAID | Attending: Emergency Medicine | Admitting: Emergency Medicine

## 2023-08-05 ENCOUNTER — Telehealth (HOSPITAL_BASED_OUTPATIENT_CLINIC_OR_DEPARTMENT_OTHER): Payer: Self-pay | Admitting: Emergency Medicine

## 2023-08-05 DIAGNOSIS — R238 Other skin changes: Secondary | ICD-10-CM

## 2023-08-05 LAB — HEPATIC FUNCTION PANEL
ALT: 15 U/L (ref 0–44)
AST: 22 U/L (ref 15–41)
Albumin: 3.9 g/dL (ref 3.5–5.0)
Alkaline Phosphatase: 68 U/L (ref 38–126)
Bilirubin, Direct: 0.2 mg/dL (ref 0.0–0.2)
Indirect Bilirubin: 0.5 mg/dL (ref 0.3–0.9)
Total Bilirubin: 0.7 mg/dL (ref 0.3–1.2)
Total Protein: 7.4 g/dL (ref 6.5–8.1)

## 2023-08-05 LAB — RAPID HIV SCREEN (HIV 1/2 AB+AG)
HIV 1/2 Antibodies: NONREACTIVE
HIV-1 P24 Antigen - HIV24: NONREACTIVE

## 2023-08-05 MED ORDER — OXYCODONE HCL 5 MG PO TABS
5.0000 mg | ORAL_TABLET | Freq: Once | ORAL | Status: AC
  Administered 2023-08-05: 5 mg via ORAL
  Filled 2023-08-05: qty 1

## 2023-08-05 MED ORDER — OXYCODONE HCL 5 MG PO TABS: 5.0000 mg | ORAL_TABLET | ORAL | 0 refills | Status: DC | PRN

## 2023-08-05 MED ORDER — DOXYCYCLINE HYCLATE 100 MG PO CAPS: 100.0000 mg | ORAL_CAPSULE | Freq: Two times a day (BID) | ORAL | 0 refills | Status: AC
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
## 2023-08-05 NOTE — Discharge Instructions (Signed)
You were evaluated in the Emergency Department and after careful evaluation, we did not find any emergent condition requiring admission or further testing in the hospital.  Your exam/testing today was overall reassuring.  Recommend follow-up with a dermatologist for continued management.  Take the doxycycline antibiotic to treat for secondary bacterial infection in the armpit region as we discussed.  Recommend Tylenol 1000 mg every 4-6 hours and/or Motrin 600 mg every 4-6 hours for pain.  You can use the oxycodone medication for more significant pain.  Please return to the Emergency Department if you experience any worsening of your condition.  Thank you for allowing [place] to be a part of your care.

## 2023-10-23 ENCOUNTER — Other Ambulatory Visit: Payer: Self-pay

## 2023-10-23 ENCOUNTER — Emergency Department (HOSPITAL_BASED_OUTPATIENT_CLINIC_OR_DEPARTMENT_OTHER)
Admission: EM | Admit: 2023-10-23 | Discharge: 2023-10-23 | Disposition: A | Discharge status: Home / Self Care | Payer: MEDICAID | Attending: Emergency Medicine | Admitting: Emergency Medicine

## 2023-10-23 ENCOUNTER — Encounter (HOSPITAL_BASED_OUTPATIENT_CLINIC_OR_DEPARTMENT_OTHER): Payer: Self-pay

## 2023-10-23 DIAGNOSIS — Z7982 Long term (current) use of aspirin: Secondary | ICD-10-CM | POA: Diagnosis not present

## 2023-10-23 DIAGNOSIS — Z7984 Long term (current) use of oral hypoglycemic drugs: Secondary | ICD-10-CM | POA: Insufficient documentation

## 2023-10-23 DIAGNOSIS — K047 Periapical abscess without sinus: Secondary | ICD-10-CM | POA: Insufficient documentation

## 2023-10-23 DIAGNOSIS — E119 Type 2 diabetes mellitus without complications: Secondary | ICD-10-CM | POA: Diagnosis not present

## 2023-10-23 DIAGNOSIS — Z794 Long term (current) use of insulin: Secondary | ICD-10-CM | POA: Diagnosis not present

## 2023-10-23 DIAGNOSIS — B029 Zoster without complications: Secondary | ICD-10-CM | POA: Diagnosis not present

## 2023-10-23 MED ORDER — VALACYCLOVIR HCL 1 G PO TABS: 1000.0000 mg | ORAL_TABLET | Freq: Three times a day (TID) | ORAL | 0 refills | Status: DC

## 2023-10-23 MED ORDER — PREDNISONE 50 MG PO TABS: 50.0000 mg | ORAL_TABLET | Freq: Every day | ORAL | 0 refills | Status: DC

## 2023-10-23 MED ORDER — IBUPROFEN 600 MG PO TABS: 600.0000 mg | ORAL_TABLET | Freq: Four times a day (QID) | ORAL | 0 refills | Status: DC | PRN

## 2023-10-23 MED ORDER — HYDROCODONE-ACETAMINOPHEN 5-325 MG PO TABS: 1.0000 | ORAL_TABLET | ORAL | 0 refills | Status: DC | PRN

## 2023-10-23 MED ORDER — AMOXICILLIN 500 MG PO CAPS: 500.0000 mg | ORAL_CAPSULE | Freq: Three times a day (TID) | ORAL | 0 refills | Status: DC

## 2023-10-23 NOTE — ED Triage Notes (Addendum)
Pt states she has two abscesses in her mouth - says she tried to rupture them but felt like something metal in her gums. She has dentures and thinks they are the cause. Pt also has blisters which she believes to be shingles - she had shingles recently and was treated but has some new blisters since then.   Robina Ade, RN

## 2023-10-23 NOTE — ED Notes (Signed)
Reviewed discharge instructions and medications with pt. Pt states understanding

## 2023-10-23 NOTE — ED Provider Notes (Signed)
Erika Rhodes Provider Note   CSN: [id number] Arrival date & time: 10/23/23  1049     History  Chief Complaint  Patient presents with   Abscess    Erika Rhodes is a 42 y.o. female.  Pt is a 42 yo female with pmhx significant for depression, RA, migraines, DM, PTSD, depression, and cellulitis.  Pt is here for 3 reasons.  She had dentures placed in April or May and does not think they are fitting properly.  She thinks she has some dental abscesses in her mouth which are draining.  She also broke out in some blisters to her right upper back and the blisters have ruptured.  It is causing a lot of pain.  Pt also has joint pain which she thinks is a RA exacerbation.       Home Medications Prior to Admission medications   Medication Sig Start Date End Date Taking? Authorizing Provider  amoxicillin (AMOXIL) 500 MG capsule Take 1 capsule (500 mg total) by mouth 3 (three) times daily. 10/23/23  Yes [name], MD  HYDROcodone-acetaminophen (NORCO/VICODIN) 5-325 MG tablet Take 1 tablet by mouth every 4 (four) hours as needed. 10/23/23  Yes [name], MD  ibuprofen (ADVIL) 600 MG tablet Take 1 tablet (600 mg total) by mouth every 6 (six) hours as needed. 10/23/23  Yes [name], MD  predniSONE (DELTASONE) 50 MG tablet Take 1 tablet (50 mg total) by mouth daily with breakfast. 10/23/23  Yes [name], MD  valACYclovir (VALTREX) 1000 MG tablet Take 1 tablet (1,000 mg total) by mouth 3 (three) times daily. 10/23/23  Yes [name], MD  albuterol (VENTOLIN HFA) 108 (90 Base) MCG/ACT inhaler Inhale 1-2 puffs into the lungs every 6 (six) hours as needed for wheezing or shortness of breath. 01/11/23   Gareth Eagle, PA-C  Aspirin-Acetaminophen-Caffeine (GOODY HEADACHE PO) Take 1 packet by mouth as needed (headache,pain).    [provider]  benzonatate (TESSALON) 100 MG capsule Take 1 capsule (100 mg total) by mouth  every 8 (eight) hours. 01/06/23   Melene Plan, DO  benztropine (COGENTIN) 0.5 MG tablet Take 1 tablet (0.5 mg total) by mouth daily. 12/18/22 01/17/23  Phineas Inches, MD  blood glucose meter kit and supplies Dispense based on patient and insurance preference. Use up to four times daily as directed. (FOR ICD-10 E10.9, E11.9). 12/17/22   Massengill, Harrold Donath, MD  clindamycin (CLEOCIN T) 1 % lotion Apply 1 application  topically daily as needed (breakouts). To affected area on face    [provider]  diazepam (VALIUM) 5 MG tablet Take 1 tablet (5 mg total) by mouth every 6 (six) hours as needed for anxiety. Patient taking differently: Take 5 mg by mouth 2 (two) times daily. 08/01/22   Molpus, John, MD  docusate sodium (COLACE) 100 MG capsule Take 1 capsule (100 mg total) by mouth every 12 (twelve) hours. 12/17/22   Massengill, Harrold Donath, MD  Fexofenadine HCl (ALLEGRA PO) Take 1 tablet by mouth daily.    [provider]  fluticasone (FLONASE) 50 MCG/ACT nasal spray Place 1 spray into both nostrils daily. 12/18/22   Massengill, Harrold Donath, MD  gabapentin (NEURONTIN) 400 MG capsule Take 1 capsule (400 mg total) by mouth 3 (three) times daily. 12/17/22 01/16/23  Massengill, Harrold Donath, MD  ibuprofen (ADVIL) 200 MG tablet Take 800 mg by mouth as needed for mild pain or moderate pain.    [provider]  insulin aspart (NOVOLOG) 100 UNIT/ML  injection Inject 0-20 Units into the skin 3 (three) times daily with meals. CBG 70 - 120: 0 units.CBG 121 - 150: 3 units.CBG 151 - 200: 4 units.CBG 201 - 250: 7 units.CBG 251 - 300: 11 units.CBG 301 - 350: 15 units.CBG 351 - 400: 20 units.CBG > 400 call 911 12/17/22   Massengill, Harrold Donath, MD  insulin aspart (NOVOLOG) 100 UNIT/ML injection Inject 0-5 Units into the skin at bedtime. CBG 70 - 120: 0 units. CBG 121 - 150: 0 units.CBG 151 - 200: 0 units.CBG 201 - 250: 2 units.CBG 251 - 300: 3 units.CBG 301 - 350: 4 units.CBG 351 - 400: 5 units.CBG > 400 call 911 12/17/22    Massengill, Harrold Donath, MD  insulin aspart (NOVOLOG) 100 UNIT/ML injection Inject 8 Units into the skin 3 (three) times daily with meals. 12/17/22   Massengill, Harrold Donath, MD  insulin detemir (LEVEMIR) 100 UNIT/ML injection Inject 0.35 mLs (35 Units total) into the skin at bedtime. 12/17/22   Massengill, Harrold Donath, MD  metFORMIN (GLUCOPHAGE-XR) 750 MG 24 hr tablet Take 1 tablet (750 mg total) by mouth 2 (two) times daily with a meal. 12/17/22 01/16/23  Massengill, Harrold Donath, MD  nicotine (NICODERM CQ - DOSED IN MG/24 HOURS) 14 mg/24hr patch Place 1 patch (14 mg total) onto the skin daily. 12/18/22   Massengill, Harrold Donath, MD  nicotine polacrilex (NICORETTE) 2 MG gum Take 1 each (2 mg total) by mouth as needed for smoking cessation. 12/17/22   Massengill, Harrold Donath, MD  omeprazole (PRILOSEC OTC) 20 MG tablet Take 20 mg by mouth daily.    [provider]  ondansetron (ZOFRAN-ODT) 4 MG disintegrating tablet 4mg  ODT q4 hours prn nausea/vomit 01/06/23   Melene Plan, DO  oxyCODONE (ROXICODONE) 5 MG immediate release tablet Take 1 tablet (5 mg total) by mouth every 4 (four) hours as needed for severe pain. 08/05/23   Sabas Sous, MD  paliperidone (INVEGA) 6 MG 24 hr tablet Take 1 tablet (6 mg total) by mouth at bedtime. 12/17/22 01/16/23  Massengill, Harrold Donath, MD  traZODone (DESYREL) 50 MG tablet Take 1 tablet (50 mg total) by mouth at bedtime as needed for sleep. 12/17/22 01/16/23  Massengill, Harrold Donath, MD      Allergies    Z-pak [azithromycin], Geodon [ziprasidone], Haldol [haloperidol], Olanzapine, Rexulti [brexpiprazole], and Risperidone and related    Review of Systems   Review of Systems  HENT:  Positive for dental problem.   Musculoskeletal:  Positive for arthralgias.  Skin:  Positive for rash.  All other systems reviewed and are negative.   Physical Exam Updated Vital Signs BP 118/89   Pulse 97   Temp 99 F (37.2 C) (Oral)   Resp 16   Ht 5\' 3"  (1.6 m)   Wt 83.9 kg   SpO2 96%   BMI 32.77 kg/m  Physical  Exam Vitals and nursing note reviewed.  Constitutional:      Appearance: Normal appearance.  HENT:     Head: Normocephalic and atraumatic.     Comments: Pt is edentulous.  There are ulcers in her mouth.  She feels like they are draining.  I don't see this currently.    Right Ear: External ear normal.     Left Ear: External ear normal.     Nose: Nose normal.     Mouth/Throat:     Mouth: Mucous membranes are moist.  Eyes:     Extraocular Movements: Extraocular movements intact.     Conjunctiva/sclera: Conjunctivae normal.     Pupils: Pupils  are equal, round, and reactive to light.  Cardiovascular:     Rate and Rhythm: Normal rate and regular rhythm.     Pulses: Normal pulses.     Heart sounds: Normal heart sounds.  Pulmonary:     Effort: Pulmonary effort is normal.     Breath sounds: Normal breath sounds.  Abdominal:     General: Abdomen is flat. Bowel sounds are normal.     Palpations: Abdomen is soft.  Musculoskeletal:        General: Normal range of motion.     Cervical back: Normal range of motion and neck supple.     Comments: Rash upper right back.  See pictures.  Pt said lesions looked like blisters before they ruptured.  Skin:    General: Skin is warm.     Capillary Refill: Capillary refill takes less than 2 seconds.  Neurological:     General: No focal deficit present.     Mental Status: She is alert.  Psychiatric:        Mood and Affect: Mood normal.        Behavior: Behavior normal.     ED Results / Procedures / Treatments   Labs (all labs ordered are listed, but only abnormal results are displayed) Labs Reviewed - No data to display  EKG None  Radiology No results found.  Procedures Procedures    Medications Ordered in ED Medications - No data to display  ED Course/ Medical Decision Making/ A&P                                 Medical Decision Making Risk Prescription drug management.   This patient presents to the ED for concern of mouth  pain and rash, this involves an extensive number of treatment options, and is a complaint that carries with it a high risk of complications and morbidity.  The differential diagnosis includes abscesses, cellulitis, shingles   Co morbidities that complicate the patient evaluation  depression, RA, migraines, DM, PTSD, depression, and cellulitis   Additional history obtained:  Additional history obtained from epic chart review External records from outside source obtained and reviewed including sig other  Medicines ordered and prescription drug management:  I have reviewed the patients home medicines and have made adjustments as needed  Problem List / ED Course:  Shingles rash:  rash was vesicular per pt.  It is only on one side of her back, so I will treat with valtrex. Dental abscesses/ulcers:  pt d/c with amox RA: pt requested prednisone.  She has dm.  She knows they can cause infections to worsen and bs to elevated.  Pt is instructed to keep close eye on her sx.     Reevaluation:  After the interventions noted above, I reevaluated the patient and found that they have :improved   Social Determinants of Health:  Lives at home   Dispostion:  After consideration of the diagnostic results and the patients response to treatment, I feel that the patent would benefit from discharge with outpatient f/u.          Final Clinical Impression(s) / ED Diagnoses Final diagnoses:  Herpes zoster without complication  Dental abscess    Rx / DC Orders ED Discharge Orders          Ordered    valACYclovir (VALTREX) 1000 MG tablet  3 times daily        10/23/23 1141  predniSONE (DELTASONE) 50 MG tablet  Daily with breakfast        10/23/23 1141    amoxicillin (AMOXIL) 500 MG capsule  3 times daily        10/23/23 1141    ibuprofen (ADVIL) 600 MG tablet  Every 6 hours PRN        10/23/23 1141    HYDROcodone-acetaminophen (NORCO/VICODIN) 5-325 MG tablet  Every 4 hours PRN         10/23/23 1141              [name], MD 10/23/23 1200

## 2024-01-09 ENCOUNTER — Inpatient Hospital Stay (HOSPITAL_BASED_OUTPATIENT_CLINIC_OR_DEPARTMENT_OTHER)
Admission: EM | Admit: 2024-01-09 | Discharge: 2024-01-13 | DRG: 603 | Disposition: A | Discharge status: Home / Self Care | Payer: MEDICAID | Attending: Family Medicine | Admitting: Family Medicine

## 2024-01-09 ENCOUNTER — Emergency Department (HOSPITAL_BASED_OUTPATIENT_CLINIC_OR_DEPARTMENT_OTHER): Payer: MEDICAID

## 2024-01-09 ENCOUNTER — Other Ambulatory Visit: Payer: Self-pay

## 2024-01-09 ENCOUNTER — Encounter (HOSPITAL_BASED_OUTPATIENT_CLINIC_OR_DEPARTMENT_OTHER): Payer: Self-pay | Admitting: Emergency Medicine

## 2024-01-09 DIAGNOSIS — E876 Hypokalemia: Secondary | ICD-10-CM | POA: Diagnosis present

## 2024-01-09 DIAGNOSIS — Z7984 Long term (current) use of oral hypoglycemic drugs: Secondary | ICD-10-CM

## 2024-01-09 DIAGNOSIS — F319 Bipolar disorder, unspecified: Secondary | ICD-10-CM | POA: Diagnosis present

## 2024-01-09 DIAGNOSIS — R9431 Abnormal electrocardiogram [ECG] [EKG]: Secondary | ICD-10-CM | POA: Diagnosis present

## 2024-01-09 DIAGNOSIS — L02219 Cutaneous abscess of trunk, unspecified: Secondary | ICD-10-CM | POA: Diagnosis present

## 2024-01-09 DIAGNOSIS — Z888 Allergy status to other drugs, medicaments and biological substances status: Secondary | ICD-10-CM

## 2024-01-09 DIAGNOSIS — E1165 Type 2 diabetes mellitus with hyperglycemia: Secondary | ICD-10-CM | POA: Diagnosis present

## 2024-01-09 DIAGNOSIS — B9562 Methicillin resistant Staphylococcus aureus infection as the cause of diseases classified elsewhere: Secondary | ICD-10-CM | POA: Diagnosis present

## 2024-01-09 DIAGNOSIS — Z6830 Body mass index (BMI) 30.0-30.9, adult: Secondary | ICD-10-CM

## 2024-01-09 DIAGNOSIS — Z8632 Personal history of gestational diabetes: Secondary | ICD-10-CM

## 2024-01-09 DIAGNOSIS — Z8261 Family history of arthritis: Secondary | ICD-10-CM

## 2024-01-09 DIAGNOSIS — Z90710 Acquired absence of both cervix and uterus: Secondary | ICD-10-CM

## 2024-01-09 DIAGNOSIS — M069 Rheumatoid arthritis, unspecified: Secondary | ICD-10-CM | POA: Diagnosis present

## 2024-01-09 DIAGNOSIS — F17290 Nicotine dependence, other tobacco product, uncomplicated: Secondary | ICD-10-CM | POA: Diagnosis present

## 2024-01-09 DIAGNOSIS — L03818 Cellulitis of other sites: Principal | ICD-10-CM | POA: Diagnosis present

## 2024-01-09 DIAGNOSIS — L0390 Cellulitis, unspecified: Secondary | ICD-10-CM | POA: Diagnosis present

## 2024-01-09 DIAGNOSIS — Z79899 Other long term (current) drug therapy: Secondary | ICD-10-CM

## 2024-01-09 DIAGNOSIS — Z7952 Long term (current) use of systemic steroids: Secondary | ICD-10-CM

## 2024-01-09 DIAGNOSIS — Z818 Family history of other mental and behavioral disorders: Secondary | ICD-10-CM

## 2024-01-09 DIAGNOSIS — F419 Anxiety disorder, unspecified: Secondary | ICD-10-CM | POA: Diagnosis present

## 2024-01-09 DIAGNOSIS — J45909 Unspecified asthma, uncomplicated: Secondary | ICD-10-CM | POA: Diagnosis present

## 2024-01-09 DIAGNOSIS — Z9151 Personal history of suicidal behavior: Secondary | ICD-10-CM

## 2024-01-09 DIAGNOSIS — L03314 Cellulitis of groin: Secondary | ICD-10-CM | POA: Diagnosis present

## 2024-01-09 DIAGNOSIS — Z98891 History of uterine scar from previous surgery: Secondary | ICD-10-CM

## 2024-01-09 DIAGNOSIS — E66811 Obesity, class 1: Secondary | ICD-10-CM | POA: Diagnosis present

## 2024-01-09 DIAGNOSIS — Z9049 Acquired absence of other specified parts of digestive tract: Secondary | ICD-10-CM

## 2024-01-09 LAB — CBC
HCT: 36.7 % (ref 36.0–46.0)
Hemoglobin: 12.4 g/dL (ref 12.0–15.0)
MCH: 30.2 pg (ref 26.0–34.0)
MCHC: 33.8 g/dL (ref 30.0–36.0)
MCV: 89.5 fL (ref 80.0–100.0)
Platelets: 269 10*3/uL (ref 150–400)
RBC: 4.1 MIL/uL (ref 3.87–5.11)
RDW: 12 % (ref 11.5–15.5)
WBC: 10.8 10*3/uL — ABNORMAL HIGH (ref 4.0–10.5)
nRBC: 0 % (ref 0.0–0.2)

## 2024-01-09 LAB — CBC WITH DIFFERENTIAL/PLATELET
Abs Immature Granulocytes: 0.06 10*3/uL (ref 0.00–0.07)
Basophils Absolute: 0 10*3/uL (ref 0.0–0.1)
Basophils Relative: 0 %
Eosinophils Absolute: 0.1 10*3/uL (ref 0.0–0.5)
Eosinophils Relative: 1 %
HCT: 39.7 % (ref 36.0–46.0)
Hemoglobin: 14.2 g/dL (ref 12.0–15.0)
Immature Granulocytes: 1 %
Lymphocytes Relative: 11 %
Lymphs Abs: 1.4 10*3/uL (ref 0.7–4.0)
MCH: 30.9 pg (ref 26.0–34.0)
MCHC: 35.8 g/dL (ref 30.0–36.0)
MCV: 86.5 fL (ref 80.0–100.0)
Monocytes Absolute: 0.6 10*3/uL (ref 0.1–1.0)
Monocytes Relative: 5 %
Neutro Abs: 10.1 10*3/uL — ABNORMAL HIGH (ref 1.7–7.7)
Neutrophils Relative %: 82 %
Platelets: 292 10*3/uL (ref 150–400)
RBC: 4.59 MIL/uL (ref 3.87–5.11)
RDW: 11.9 % (ref 11.5–15.5)
WBC: 12.2 10*3/uL — ABNORMAL HIGH (ref 4.0–10.5)
nRBC: 0 % (ref 0.0–0.2)

## 2024-01-09 LAB — BASIC METABOLIC PANEL
Anion gap: 11 (ref 5–15)
BUN: 8 mg/dL (ref 6–20)
CO2: 27 mmol/L (ref 22–32)
Calcium: 8.8 mg/dL — ABNORMAL LOW (ref 8.9–10.3)
Chloride: 98 mmol/L (ref 98–111)
Creatinine, Ser: 0.63 mg/dL (ref 0.44–1.00)
GFR, Estimated: >60 mL/min (ref >60)
Glucose, Bld: 166 mg/dL — ABNORMAL HIGH (ref 70–99)
Potassium: 3 mmol/L — ABNORMAL LOW (ref 3.5–5.1)
Sodium: 136 mmol/L (ref 135–145)

## 2024-01-09 LAB — HEMOGLOBIN A1C
Hgb A1c MFr Bld: 6.7 % — ABNORMAL HIGH (ref 4.8–5.6)
Mean Plasma Glucose: 145.59 mg/dL

## 2024-01-09 LAB — GLUCOSE, CAPILLARY
Glucose-Capillary: 159 mg/dL — ABNORMAL HIGH (ref 70–99)
Glucose-Capillary: 110 mg/dL — ABNORMAL HIGH (ref 70–99)

## 2024-01-09 LAB — CREATININE, SERUM
Creatinine, Ser: 0.51 mg/dL (ref 0.44–1.00)
GFR, Estimated: >60 mL/min (ref >60)

## 2024-01-09 MED ORDER — FENTANYL CITRATE PF 50 MCG/ML IJ SOSY
50.0000 ug | PREFILLED_SYRINGE | Freq: Once | INTRAMUSCULAR | Status: AC
  Administered 2024-01-09: 50 ug via INTRAVENOUS
  Filled 2024-01-09: qty 1

## 2024-01-09 MED ORDER — INSULIN ASPART 100 UNIT/ML IJ SOLN: 0.0000 [IU] | Freq: Every day | INTRAMUSCULAR | Status: DC

## 2024-01-09 MED ORDER — LORAZEPAM 1 MG PO TABS
1.0000 mg | ORAL_TABLET | Freq: Two times a day (BID) | ORAL | Status: DC | PRN
  Administered 2024-01-12 – 2024-01-13 (×3): 1 mg via ORAL
  Filled 2024-01-09 (×3): qty 1

## 2024-01-09 MED ORDER — NICOTINE 14 MG/24HR TD PT24: 14.0000 mg | MEDICATED_PATCH | Freq: Every day | TRANSDERMAL | Status: DC

## 2024-01-09 MED ORDER — INSULIN ASPART 100 UNIT/ML IJ SOLN
8.0000 [IU] | Freq: Three times a day (TID) | INTRAMUSCULAR | Status: DC
  Administered 2024-01-09 – 2024-01-13 (×7): 8 [IU] via SUBCUTANEOUS

## 2024-01-09 MED ORDER — MORPHINE SULFATE (PF) 4 MG/ML IV SOLN
4.0000 mg | Freq: Once | INTRAVENOUS | Status: AC
  Administered 2024-01-09: 4 mg via INTRAVENOUS
  Filled 2024-01-09: qty 1

## 2024-01-09 MED ORDER — VANCOMYCIN HCL 750 MG/150ML IV SOLN
750.0000 mg | Freq: Two times a day (BID) | INTRAVENOUS | Status: DC
  Administered 2024-01-10 – 2024-01-13 (×7): 750 mg via INTRAVENOUS
  Filled 2024-01-09 (×8): qty 150

## 2024-01-09 MED ORDER — POLYETHYLENE GLYCOL 3350 17 G PO PACK: 17.0000 g | PACK | Freq: Every day | ORAL | Status: DC | PRN

## 2024-01-09 MED ORDER — ENOXAPARIN SODIUM 40 MG/0.4ML IJ SOSY
40.0000 mg | PREFILLED_SYRINGE | Freq: Every day | INTRAMUSCULAR | Status: DC
  Administered 2024-01-09 – 2024-01-12 (×4): 40 mg via SUBCUTANEOUS
  Filled 2024-01-09 (×4): qty 0.4

## 2024-01-09 MED ORDER — SODIUM CHLORIDE 0.9 % IV SOLN: INTRAVENOUS | Status: AC | PRN

## 2024-01-09 MED ORDER — PALIPERIDONE ER 6 MG PO TB24: 6.0000 mg | ORAL_TABLET | Freq: Every day | ORAL | Status: DC

## 2024-01-09 MED ORDER — HYDROCODONE-ACETAMINOPHEN 5-325 MG PO TABS: 1.0000 | ORAL_TABLET | ORAL | Status: DC | PRN

## 2024-01-09 MED ORDER — POTASSIUM CHLORIDE CRYS ER 20 MEQ PO TBCR
40.0000 meq | EXTENDED_RELEASE_TABLET | Freq: Once | ORAL | Status: AC
  Administered 2024-01-09: 40 meq via ORAL
  Filled 2024-01-09: qty 2

## 2024-01-09 MED ORDER — OXYCODONE HCL 5 MG PO TABS
5.0000 mg | ORAL_TABLET | ORAL | Status: DC | PRN
Start: 2024-01-09 — End: 2024-01-13
  Administered 2024-01-10 – 2024-01-13 (×15): 5 mg via ORAL
  Filled 2024-01-09 (×15): qty 1

## 2024-01-09 MED ORDER — PROMETHAZINE HCL 25 MG PO TABS: 12.5000 mg | ORAL_TABLET | Freq: Four times a day (QID) | ORAL | Status: DC | PRN

## 2024-01-09 MED ORDER — IOHEXOL 300 MG/ML  SOLN
75.0000 mL | Freq: Once | INTRAMUSCULAR | Status: AC | PRN
  Administered 2024-01-09: 75 mL via INTRAVENOUS

## 2024-01-09 MED ORDER — VANCOMYCIN HCL IN DEXTROSE 1-5 GM/200ML-% IV SOLN: 1000.0000 mg | Freq: Once | INTRAVENOUS | Status: DC

## 2024-01-09 MED ORDER — VANCOMYCIN HCL IN DEXTROSE 1-5 GM/200ML-% IV SOLN
1000.0000 mg | Freq: Once | INTRAVENOUS | Status: AC
  Administered 2024-01-09: 1000 mg via INTRAVENOUS
  Filled 2024-01-09: qty 200

## 2024-01-09 MED ORDER — INSULIN ASPART 100 UNIT/ML IJ SOLN
0.0000 [IU] | Freq: Three times a day (TID) | INTRAMUSCULAR | Status: DC
  Administered 2024-01-09: 4 [IU] via SUBCUTANEOUS

## 2024-01-09 MED ORDER — METFORMIN HCL ER 750 MG PO TB24: 750.0000 mg | ORAL_TABLET | Freq: Two times a day (BID) | ORAL | Status: DC

## 2024-01-09 MED ORDER — SODIUM CHLORIDE 0.9% FLUSH
3.0000 mL | Freq: Two times a day (BID) | INTRAVENOUS | Status: DC
Start: 2024-01-09 — End: 2024-01-13
  Administered 2024-01-09 – 2024-01-13 (×8): 3 mL via INTRAVENOUS

## 2024-01-09 NOTE — Progress Notes (Signed)
 Pharmacy Antibiotic Note  Erika Rhodes is a 43 y.o. female admitted on 01/09/2024 with cellulitis.  Pharmacy has been consulted for Vanco dosing.  Active Problem(s): Abscess to right pubic to groin area x 5 days , bloody/purulent drainage   PMH: anxiety, asthma, BZD-induced psychosis, cellulitis, Depression, DM, gesational DM, Drug OD, migraines, PTSA, RA, sepsis, steroid-induced psychosis  ID: Cellulitis Afebrile, WBC 12.2. Scr <1  Vanco 3/2>>  Plan: Vanco 1g IV x 1 Vancomycin 750 mg IV Q 12 hrs. Goal AUC 400-550. Expected AUC: 516 SCr used: 0.8    Height: 5\' 4"  (162.6 cm) Weight: 81.3 kg (179 lb 3.7 oz) IBW/kg (Calculated) : 54.7  Temp (24hrs), Avg:98.3 F (36.8 C), Min:97.9 F (36.6 C), Max:98.6 F (37 C)  Recent Labs  Lab 01/09/24 1004  WBC 12.2*  CREATININE 0.63    Estimated Creatinine Clearance: 96.4 mL/min (by C-G formula based on SCr of 0.63 mg/dL).    Allergies  Allergen Reactions   Z-Pak [Azithromycin] Anaphylaxis   Geodon [Ziprasidone] Swelling and Other (See Comments)    Dystonic reaction, slurred speech, unable to walk.    Haldol [Haloperidol] Swelling and Other (See Comments)    Dystonic reaction, slurred speech, unable to walk.    Olanzapine Swelling    Dystonic reactiom   Rexulti [Brexpiprazole] Swelling and Other (See Comments)    Dystonic reaction, slurred speech, unable to walk.    Risperidone And Related Swelling and Other (See Comments)    Dystonic reaction, slurred speech, unable to walk.    Darbie Biancardi S. Merilynn Finland, PharmD, BCPS Clinical Staff Pharmacist  Misty Stanley Stillinger 01/09/2024 4:06 PM

## 2024-01-09 NOTE — ED Notes (Signed)
 Called [facility] for transport.

## 2024-01-09 NOTE — ED Provider Notes (Signed)
 [facility] Provider Note   CSN: [id number] Arrival date & time: 01/09/24  7829     History  Chief Complaint  Patient presents with   Abscess    Erika Rhodes is a 43 y.o. female.  Patient is a 44 year old female with a history of rheumatoid arthritis, diabetes who presents with worsening pain and redness to her right groin area for the last 4 to 5 days.  She denies any fevers.  She has had a little bit of bloody drainage from the area.  No history of similar symptoms in the past.  She has had once been reported to be shingles in her upper back area.  She still has some healing lesions from this.       Home Medications Prior to Admission medications   Medication Sig Start Date End Date Taking? Authorizing Provider  albuterol (VENTOLIN HFA) 108 (90 Base) MCG/ACT inhaler Inhale 1-2 puffs into the lungs every 6 (six) hours as needed for wheezing or shortness of breath. 01/11/23   Gareth Eagle, PA-C  amoxicillin (AMOXIL) 500 MG capsule Take 1 capsule (500 mg total) by mouth 3 (three) times daily. 10/23/23   [name], MD  Aspirin-Acetaminophen-Caffeine (GOODY HEADACHE PO) Take 1 packet by mouth as needed (headache,pain).    [provider]  benzonatate (TESSALON) 100 MG capsule Take 1 capsule (100 mg total) by mouth every 8 (eight) hours. 01/06/23   Melene Plan, DO  benztropine (COGENTIN) 0.5 MG tablet Take 1 tablet (0.5 mg total) by mouth daily. 12/18/22 01/17/23  Phineas Inches, MD  blood glucose meter kit and supplies Dispense based on patient and insurance preference. Use up to four times daily as directed. (FOR ICD-10 E10.9, E11.9). 12/17/22   Massengill, Harrold Donath, MD  clindamycin (CLEOCIN T) 1 % lotion Apply 1 application  topically daily as needed (breakouts). To affected area on face    [provider]  diazepam (VALIUM) 5 MG tablet Take 1 tablet (5 mg total) by mouth every 6 (six) hours as needed for  anxiety. Patient taking differently: Take 5 mg by mouth 2 (two) times daily. 08/01/22   Molpus, John, MD  docusate sodium (COLACE) 100 MG capsule Take 1 capsule (100 mg total) by mouth every 12 (twelve) hours. 12/17/22   Massengill, Harrold Donath, MD  Fexofenadine HCl (ALLEGRA PO) Take 1 tablet by mouth daily.    [provider]  fluticasone (FLONASE) 50 MCG/ACT nasal spray Place 1 spray into both nostrils daily. 12/18/22   Massengill, Harrold Donath, MD  gabapentin (NEURONTIN) 400 MG capsule Take 1 capsule (400 mg total) by mouth 3 (three) times daily. 12/17/22 01/16/23  Massengill, Harrold Donath, MD  HYDROcodone-acetaminophen (NORCO/VICODIN) 5-325 MG tablet Take 1 tablet by mouth every 4 (four) hours as needed. 10/23/23   [name], MD  ibuprofen (ADVIL) 200 MG tablet Take 800 mg by mouth as needed for mild pain or moderate pain.    [provider]  ibuprofen (ADVIL) 600 MG tablet Take 1 tablet (600 mg total) by mouth every 6 (six) hours as needed. 10/23/23   [name], MD  insulin aspart (NOVOLOG) 100 UNIT/ML injection Inject 0-20 Units into the skin 3 (three) times daily with meals. CBG 70 - 120: 0 units.CBG 121 - 150: 3 units.CBG 151 - 200: 4 units.CBG 201 - 250: 7 units.CBG 251 - 300: 11 units.CBG 301 - 350: 15 units.CBG 351 - 400: 20 units.CBG > 400 call 911 12/17/22   Phineas Inches, MD  insulin aspart (NOVOLOG) 100 UNIT/ML injection Inject 0-5 Units into the skin at bedtime. CBG 70 - 120: 0 units. CBG 121 - 150: 0 units.CBG 151 - 200: 0 units.CBG 201 - 250: 2 units.CBG 251 - 300: 3 units.CBG 301 - 350: 4 units.CBG 351 - 400: 5 units.CBG > 400 call 911 12/17/22   Massengill, Harrold Donath, MD  insulin aspart (NOVOLOG) 100 UNIT/ML injection Inject 8 Units into the skin 3 (three) times daily with meals. 12/17/22   Massengill, Harrold Donath, MD  insulin detemir (LEVEMIR) 100 UNIT/ML injection Inject 0.35 mLs (35 Units total) into the skin at bedtime. 12/17/22   Massengill, Harrold Donath, MD  metFORMIN (GLUCOPHAGE-XR) 750 MG  24 hr tablet Take 1 tablet (750 mg total) by mouth 2 (two) times daily with a meal. 12/17/22 01/16/23  Massengill, Harrold Donath, MD  nicotine (NICODERM CQ - DOSED IN MG/24 HOURS) 14 mg/24hr patch Place 1 patch (14 mg total) onto the skin daily. 12/18/22   Massengill, Harrold Donath, MD  nicotine polacrilex (NICORETTE) 2 MG gum Take 1 each (2 mg total) by mouth as needed for smoking cessation. 12/17/22   Massengill, Harrold Donath, MD  omeprazole (PRILOSEC OTC) 20 MG tablet Take 20 mg by mouth daily.    [provider]  ondansetron (ZOFRAN-ODT) 4 MG disintegrating tablet 4mg  ODT q4 hours prn nausea/vomit 01/06/23   Melene Plan, DO  oxyCODONE (ROXICODONE) 5 MG immediate release tablet Take 1 tablet (5 mg total) by mouth every 4 (four) hours as needed for severe pain. 08/05/23   Sabas Sous, MD  paliperidone (INVEGA) 6 MG 24 hr tablet Take 1 tablet (6 mg total) by mouth at bedtime. 12/17/22 01/16/23  Massengill, Harrold Donath, MD  predniSONE (DELTASONE) 50 MG tablet Take 1 tablet (50 mg total) by mouth daily with breakfast. 10/23/23   [name], MD  traZODone (DESYREL) 50 MG tablet Take 1 tablet (50 mg total) by mouth at bedtime as needed for sleep. 12/17/22 01/16/23  Massengill, Harrold Donath, MD  valACYclovir (VALTREX) 1000 MG tablet Take 1 tablet (1,000 mg total) by mouth 3 (three) times daily. 10/23/23   [name], MD      Allergies    Z-pak [azithromycin], Geodon [ziprasidone], Haldol [haloperidol], Olanzapine, Rexulti [brexpiprazole], and Risperidone and related    Review of Systems   Review of Systems  Constitutional:  Negative for fever.  Gastrointestinal:  Negative for nausea and vomiting.  Musculoskeletal:  Negative for arthralgias, back pain, joint swelling and neck pain.  Skin:  Positive for wound.  Neurological:  Negative for weakness, numbness and headaches.    Physical Exam Updated Vital Signs BP 111/72   Pulse 89   Temp 98.6 F (37 C) (Oral)   Resp 18   Wt 83.5 kg   SpO2 100%   BMI 32.59 kg/m   Physical Exam Constitutional:      Appearance: She is well-developed.  HENT:     Head: Normocephalic and atraumatic.  Eyes:     Pupils: Pupils are equal, round, and reactive to light.  Cardiovascular:     Rate and Rhythm: Normal rate and regular rhythm.     Heart sounds: Normal heart sounds.  Pulmonary:     Effort: Pulmonary effort is normal. No respiratory distress.     Breath sounds: Normal breath sounds. No wheezing or rales.  Chest:     Chest wall: No tenderness.  Abdominal:     General: Bowel sounds are normal.     Palpations: Abdomen is soft.     Tenderness: There  is no abdominal tenderness. There is no guarding or rebound.  Musculoskeletal:        General: Normal range of motion.     Cervical back: Normal range of motion and neck supple.  Lymphadenopathy:     Cervical: No cervical adenopathy.  Skin:    General: Skin is warm and dry.     Findings: No rash.     Comments: Patient has diffuse redness and swelling to her right pubic area up into her inguinal canal.  There is 1 area that has some bloody purulent drainage.  Although I do not feel any underlying fluctuance.  There is some induration with a cord that extends up toward her inguinal canal about 5 cm.  I do not feel any crepitance.  No necrotic areas.  Neurological:     Mental Status: She is alert and oriented to person, place, and time.     ED Results / Procedures / Treatments   Labs (all labs ordered are listed, but only abnormal results are displayed) Labs Reviewed  BASIC METABOLIC PANEL - Abnormal; Notable for the following components:      Result Value   Potassium 3.0 (*)    Glucose, Bld 166 (*)    Calcium 8.8 (*)    All other components within normal limits  CBC WITH DIFFERENTIAL/PLATELET - Abnormal; Notable for the following components:   WBC 12.2 (*)    Neutro Abs 10.1 (*)    All other components within normal limits    EKG None  Radiology CT PELVIS W CONTRAST Result Date: 01/09/2024 CLINICAL  DATA:  Abscess in right pubic and inguinal region for 5 days, bloody drainage, pain with ambulation EXAM: CT PELVIS WITH CONTRAST TECHNIQUE: Multidetector CT imaging of the pelvis was performed using the standard protocol following the bolus administration of intravenous contrast. RADIATION DOSE REDUCTION: This exam was performed according to the departmental dose-optimization program which includes automated exposure control, adjustment of the mA and/or kV according to patient size and/or use of iterative reconstruction technique. CONTRAST:  75mL OMNIPAQUE IOHEXOL 300 MG/ML  SOLN COMPARISON:  11/29/2020 FINDINGS: Urinary Tract: The distal ureters and bladder are unremarkable. No urinary tract calculi. Bowel: No bowel obstruction or ileus. Normal appendix right lower quadrant. No bowel wall thickening or inflammatory change. Vascular/Lymphatic: Right inguinal and external iliac chain adenopathy, likely reactive. Largest lymph node in the right external iliac region image 69/303 measures 16 mm in short axis. No significant vascular findings. Reproductive: Prior hysterectomy. The right ovary measures 4.2 x 2.7 x 2.7 cm, with a normal appearing 1.6 cm follicle identified. The left ovary measures 2.3 x 2.2 x 1.4 cm. Other: No free fluid or free intraperitoneal gas. No abdominal wall hernia. Musculoskeletal: There is extensive subcutaneous fat stranding involving the right inguinal crease, extending into the right side of the mons pubis. Ill-defined fluid is seen within the subcutaneous tissues, but there is no organized fluid collection or abscess at this time. No acute or destructive bony abnormalities. Reconstructed images demonstrate no additional findings. IMPRESSION: 1. Extensive subcutaneous fat stranding with ill-defined fluid in the subcutaneous tissues involving the right inguinal region and extending into the right side of the mons pubis, most consistent with cellulitis. There is no organized fluid collection  or abscess at this time. 2. Reactive adenopathy within the right inguinal and external iliac chains as above. Electronically Signed   By: Sharlet Salina M.D.   On: 01/09/2024 12:14    Procedures Procedures    Medications Ordered  in ED Medications  0.9 %  sodium chloride infusion ( Intravenous New Bag/Given 01/09/24 1340)  fentaNYL (SUBLIMAZE) injection 50 mcg (50 mcg Intravenous Given 01/09/24 1037)  iohexol (OMNIPAQUE) 300 MG/ML solution 75 mL (75 mLs Intravenous Contrast Given 01/09/24 1143)  vancomycin (VANCOCIN) IVPB 1000 mg/200 mL premix (1,000 mg Intravenous New Bag/Given 01/09/24 1341)  potassium chloride SA (KLOR-CON M) CR tablet 40 mEq (40 mEq Oral Given 01/09/24 1340)  fentaNYL (SUBLIMAZE) injection 50 mcg (50 mcg Intravenous Given 01/09/24 1341)    ED Course/ Medical Decision Making/ A&P                                 Medical Decision Making Amount and/or Complexity of Data Reviewed Labs: ordered. Radiology: ordered.  Risk Prescription drug management. Decision regarding hospitalization.   Patient is a 43 year old female who presents with worsening swelling and pain to her right groin area.  On exam she is got fairly significant cellulitis in the area.  There is some indurated areas but I could not appreciate a distinct area of fluctuance on exam.  CT was performed which shows diffuse cellulitis.  No evidence of abscess or necrotizing fasciitis.  Labs show an elevated WBC count.  Her potassium is low.  She was given potassium replacement.  Will start her on IV antibiotics.  She was given fentanyl for pain.  Will plan admission for IV antibiotics.  Discussed with the hospitalist who has accepted the patient for transfer.  Final Clinical Impression(s) / ED Diagnoses Final diagnoses:  Cellulitis of other specified site    Rx / DC Orders ED Discharge Orders     None         Rolan Bucco, MD 01/09/24 1442

## 2024-01-09 NOTE — H&P (Addendum)
 History and Physical:    Erika Rhodes   ZOX:[id number] DOB: [birth year] DOA: 01/09/2024  Referring MD/provider: [name], MD PCP: Mendel Ryder, PA-C   Patient coming from: [facility]  Chief Complaint: Right groin abscess.  History of Present Illness:   Erika Rhodes is an 43 y.o. female with a PMH of RA, DM, anxiety, asthma, bipolar disorder who presents to the hospital with suspected MRSA abscess/cellulitis.  She has a history of facial cellulitis, treated 1 year ago with Vancomycin/hospitalization, and has intermittently been on steroid therapy during rheumatoid arthritis flares.  The last time she used steroids was in early December.  She reports that she is not currently taking any maintenance medications for her rheumatoid arthritis.  The current skin eruption began around 4-5 days ago and has gotten worse over that time.  She saw a telemetry doctor who prescribed her doxycycline and she has taken this for the past 3-4 days without any noticeable improvement.  The skin on her mons pubis is erythematous with significant swelling in the area involved tracking around laterally to her hip.  There is a small ulceration that is draining serosanguineous/purulent drainage.  The only other associated symptoms she has had is some mild nausea but no vomiting and some subjective fever/chills.  Although diabetic, she does not routinely check her blood sugars and she has not had a screening A1c in about 1 year.  ED Course:  Kathline was given a dose of IV vancomycin, oral potassium for a potassium level of 3.0, and fentanyl for complaints of pain.  A CT of the affected area failed to disclose an abscess but did show significant swelling.  Labs were notable for mild leukocytosis and a potassium of 3.0, glucose 166.  Labs otherwise unremarkable.  She was subsequently referred for admission given the severity of her cellulitis.  ROS:   Review of Systems  Constitutional:  Positive for chills and fever.   HENT: Negative.    Eyes: Negative.   Respiratory: Negative.    Cardiovascular: Negative.   Gastrointestinal:  Positive for nausea.  Genitourinary: Negative.   Musculoskeletal: Negative.   Skin:        Significant swelling and erythema of her mons pubis  Neurological: Negative.   Endo/Heme/Allergies: Negative.   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  A2GDM on Insulin   Pregnancy regimen:   Date started Early Pregnancy; Dose N-70 units QAM, N 38 units QHS; Humalog 18 units premeal     Baseline A1C (if diagnosed < 24 weeks) 8.1% on 08/2014  Baseline HELLP panel and upc (if diagnosed < than 24 weeks):  reviewed and normal  Growth ultrasound at 30-32 weeks reviewed and notable for [facility] 9   History of cesarean section 05/24/2014   Formatting of this note might be different from the original.  Overview:   C/s with g1, two term VBACs following c/s  Desires TOLAC  Previous CD times 1 for failure to progress.   Operative report(s): unavailable.  Options for mode  of delivery discussed with patient and patient: desires TOLAC.    TOLAC consents signed: yes. Signed today 1/28   Intentional overdose (HCC) 09/01/2021   Migraines    PTSD (post-traumatic stress disorder) 01/12/2021   Rheumatoid arthritis (HCC) 2016   Diagnosed Dr.  Patsi Sears; has been treated with MTX, Humira, etc, but made her sick.  Was taking CBD oil and stopped as tested positive in a drug screen for work.   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    Past Surgical History:   Past Surgical History:  Procedure Laterality Date   ABDOMINAL HYSTERECTOMY  2017   Fibroids; Both ovaries intact still   BACK SURGERY     CESAREAN SECTION  2009   CHOLECYSTECTOMY  2010   laparoscopic   TONSILLECTOMY      Social History:   Social History   Socioeconomic History   Marital status: Single    Spouse name: Not on file   Number of children: 5   Years of education: 14   Highest education level: Associate degree: academic program  Occupational History   Not on file  Tobacco Use   Smoking status: Never   Smokeless tobacco: Never  Vaping Use   Vaping status: Some Days  Substance and Sexual Activity   Alcohol use: Yes    Alcohol/week: 2.0 standard drinks of alcohol    Types: 2 Glasses of wine per week    Comment: occ   Drug use: Yes    Types: Marijuana    Comment: "cannabis delta 10"   Sexual activity: Yes    Birth control/protection: Surgical  Other Topics Concern   Not on file  Social History Narrative   Lives with current boyfriend   See history of present illness for more history 04/22/2020   Social Drivers of Health   Financial Resource Strain: Not on file  Food Insecurity: No Food Insecurity (01/09/2024)   Hunger Vital Sign    Worried About Running Out of Food in the Last Year: Never true    Ran Out of Food in the Last Year: Never true  Transportation Needs: No Transportation Needs (01/09/2024)   PRAPARE - Administrator, Civil Service (Medical): No    Lack of Transportation (Non-Medical): No  Physical Activity: Not on file  Stress: Not on file  Social Connections: Not on file  Intimate Partner Violence: Not At Risk (01/09/2024)   Humiliation, Afraid, Rape, and Kick questionnaire    Fear of Current or Ex-Partner: No    Emotionally Abused: No    Physically Abused: No    Sexually Abused: No    Allergies   Z-pak [azithromycin], Geodon [ziprasidone], Haldol  [haloperidol], Olanzapine, Rexulti [brexpiprazole], and Risperidone and related  Family history:   Family History  Problem Relation Age of Onset   Arthritis Mother        Rheumatoid   Depression Daughter        and anxiety   Allergies Son     Current Medications:   Prior to Admission medications   Medication Sig Start Date End Date Taking? Authorizing Provider  albuterol (VENTOLIN HFA) 108 (90 Base) MCG/ACT inhaler Inhale 1-2 puffs into the lungs every 6 (six) hours as needed for wheezing or shortness of breath. 01/11/23   Gareth Eagle, PA-C  amoxicillin (AMOXIL) 500 MG capsule Take 1 capsule (500 mg total) by mouth 3 (three) times daily. 10/23/23   [name], MD  Aspirin-Acetaminophen-Caffeine (GOODY HEADACHE PO) Take 1 packet by mouth as needed (headache,pain).    [provider]  benzonatate (TESSALON) 100 MG capsule Take 1 capsule (100 mg total) by mouth every 8 (eight) hours. 01/06/23   Melene Plan, DO  benztropine (COGENTIN) 0.5 MG tablet Take 1 tablet (0.5 mg total) by mouth daily. 12/18/22 01/17/23  Phineas Inches, MD  blood glucose meter kit and supplies Dispense based on patient and insurance preference. Use up to four times daily as directed. (FOR ICD-10 E10.9, E11.9). 12/17/22   Massengill, Harrold Donath, MD  clindamycin (CLEOCIN T) 1 % lotion Apply 1 application  topically daily as needed (breakouts). To affected area on face    [provider]  diazepam (VALIUM) 5 MG tablet Take 1 tablet (5 mg total) by mouth every 6 (six) hours as needed for anxiety. Patient taking differently: Take 5 mg by mouth 2 (two) times daily. 08/01/22   Molpus, John, MD  docusate sodium (COLACE) 100 MG capsule Take 1 capsule (100 mg total) by mouth every 12 (twelve) hours. 12/17/22   Massengill, Harrold Donath, MD  Fexofenadine HCl (ALLEGRA PO) Take 1 tablet by mouth daily.    [provider]  fluticasone (FLONASE) 50 MCG/ACT nasal spray Place 1 spray into both nostrils daily. 12/18/22    Massengill, Harrold Donath, MD  gabapentin (NEURONTIN) 400 MG capsule Take 1 capsule (400 mg total) by mouth 3 (three) times daily. 12/17/22 01/16/23  Massengill, Harrold Donath, MD  HYDROcodone-acetaminophen (NORCO/VICODIN) 5-325 MG tablet Take 1 tablet by mouth every 4 (four) hours as needed. 10/23/23   [name], MD  ibuprofen (ADVIL) 200 MG tablet Take 800 mg by mouth as needed for mild pain or moderate pain.    [provider]  ibuprofen (ADVIL) 600 MG tablet Take 1 tablet (600 mg total) by mouth every 6 (six) hours as needed. 10/23/23   [name], MD  insulin aspart (NOVOLOG) 100 UNIT/ML injection Inject 0-20 Units into the skin 3 (three) times daily with meals. CBG 70 - 120: 0 units.CBG 121 - 150: 3 units.CBG 151 - 200: 4 units.CBG 201 - 250: 7 units.CBG 251 - 300: 11 units.CBG 301 - 350: 15 units.CBG 351 - 400: 20 units.CBG > 400 call 911 12/17/22   Massengill, Harrold Donath, MD  insulin aspart (NOVOLOG) 100 UNIT/ML injection Inject 0-5 Units into the skin at bedtime. CBG 70 - 120: 0 units. CBG 121 - 150: 0 units.CBG 151 - 200: 0 units.CBG 201 - 250: 2 units.CBG 251 - 300: 3 units.CBG 301 - 350: 4 units.CBG 351 - 400: 5 units.CBG > 400 call 911 12/17/22   Massengill, Harrold Donath, MD  insulin aspart (NOVOLOG) 100 UNIT/ML injection Inject 8 Units into the skin 3 (three) times daily with meals. 12/17/22   Massengill, Harrold Donath, MD  insulin detemir (LEVEMIR) 100 UNIT/ML injection Inject 0.35 mLs (35 Units total) into the skin at bedtime. 12/17/22   Massengill, Harrold Donath, MD  metFORMIN (GLUCOPHAGE-XR) 750 MG 24 hr tablet Take 1 tablet (750 mg total) by mouth 2 (two) times daily with a meal. 12/17/22 01/16/23  Massengill, Harrold Donath, MD  nicotine (NICODERM CQ - DOSED IN MG/24 HOURS) 14 mg/24hr patch Place 1 patch (14 mg total) onto the skin daily. 12/18/22   Massengill, Harrold Donath, MD  nicotine polacrilex (NICORETTE) 2 MG gum Take 1 each (2 mg total) by mouth as needed for smoking cessation. 12/17/22   Massengill, Harrold Donath, MD  omeprazole  (PRILOSEC OTC) 20 MG tablet Take 20 mg by mouth daily.    [provider]  ondansetron (ZOFRAN-ODT) 4 MG disintegrating tablet 4mg  ODT q4 hours prn nausea/vomit 01/06/23   Melene Plan, DO  oxyCODONE (ROXICODONE) 5 MG immediate release tablet Take 1 tablet (5 mg total) by mouth every 4 (four) hours as needed for severe pain. 08/05/23   Sabas Sous, MD  paliperidone (INVEGA) 6 MG 24 hr tablet Take 1 tablet (6 mg total) by mouth at bedtime. 12/17/22 01/16/23  Massengill, Harrold Donath, MD  predniSONE (DELTASONE) 50 MG tablet Take 1 tablet (50 mg total) by mouth daily with breakfast. 10/23/23   [name], MD  traZODone (DESYREL) 50 MG tablet Take 1 tablet (50 mg total) by mouth at bedtime as needed for sleep. 12/17/22 01/16/23  Massengill, Harrold Donath, MD  valACYclovir (VALTREX) 1000 MG tablet Take 1 tablet (1,000 mg total) by mouth 3 (three) times daily. 10/23/23   [name], MD   *The above medication list was reviewed and is not up-to-date with what the patient tells me she takes regularly.  The patient reports that she takes Invega, Celexa, metformin, and Ativan.  She was recently prescribed doxycycline.  Physical Exam:   Vitals:   01/09/24 1430 01/09/24 1527 01/09/24 1538 01/09/24 1551  BP: 102/77 (!) 110/55 131/69   Pulse: 89 88 84   Resp: 18 16 18    Temp:  97.9 F (36.6 C) 98.2 F (36.8 C)   TempSrc:   Oral   SpO2: 100% 100% 99%   Weight:    81.3 kg  Height:    5\' 4"  (1.626 m)     Physical Exam: Blood pressure 131/69, pulse 84, temperature 98.2 F (36.8 C), temperature source Oral, resp. rate 18, height 5\' 4"  (1.626 m), weight 81.3 kg, SpO2 99%. Gen: No acute distress. Head: Normocephalic, atraumatic. Eyes: Normal appearing.   Neck: Supple, no thyromegaly, no lymphadenopathy, no jugular venous distention. Chest: Lungs are clear to auscultation with good air movement. No rales, rhonchi or wheezes.  CV: Heart sounds are regular with an S1, S2. No murmurs, rubs, clicks, or  gallops.  Abdomen: Soft, nontender, nondistended with normal active bowel sounds. No hepatosplenomegaly or palpable masses. Extremities: Extremities are without clubbing, or cyanosis. No edema. Pedal pulses 2+.  Skin: Warm and dry.  Multiple tattoos and piercings.  Mons pubis is as pictured below with striking erythema and swelling. Neuro: Alert and oriented times 3; grossly nonfocal.  Psych: Insight is good and judgment is appropriate. Mood and affect normal.   Data Review:    Labs: Basic Metabolic Panel: Recent Labs  Lab 01/09/24 1004  NA 136  K 3.0*  CL 98  CO2 27  GLUCOSE 166*  BUN 8  CREATININE 0.63  CALCIUM 8.8*   CBC: Recent Labs  Lab 01/09/24 1004  WBC 12.2*  NEUTROABS 10.1*  HGB 14.2  HCT 39.7  MCV 86.5  PLT 292    Urinalysis    Component Value Date/Time   COLORURINE YELLOW 12/09/2022 2148   APPEARANCEUR CLEAR 12/09/2022 2148   LABSPEC 1.042 (H) 12/09/2022 2148   PHURINE 6.0 12/09/2022 2148   GLUCOSEU >=500 (A) 12/09/2022 2148   HGBUR NEGATIVE 12/09/2022 2148   BILIRUBINUR NEGATIVE 12/09/2022 2148   KETONESUR NEGATIVE 12/09/2022 2148   PROTEINUR NEGATIVE 12/09/2022 2148   NITRITE NEGATIVE 12/09/2022 2148   LEUKOCYTESUR NEGATIVE 12/09/2022 2148      Radiographic Studies: CT PELVIS W CONTRAST Result Date: 01/09/2024 CLINICAL DATA:  Abscess in right pubic and inguinal region for 5 days, bloody drainage, pain with ambulation EXAM: CT  PELVIS WITH CONTRAST TECHNIQUE: Multidetector CT imaging of the pelvis was performed using the standard protocol following the bolus administration of intravenous contrast. RADIATION DOSE REDUCTION: This exam was performed according to the departmental dose-optimization program which includes automated exposure control, adjustment of the mA and/or kV according to patient size and/or use of iterative reconstruction technique. CONTRAST:  75mL OMNIPAQUE IOHEXOL 300 MG/ML  SOLN COMPARISON:  11/29/2020 FINDINGS: Urinary Tract: The  distal ureters and bladder are unremarkable. No urinary tract calculi. Bowel: No bowel obstruction or ileus. Normal appendix right lower quadrant. No bowel wall thickening or inflammatory change. Vascular/Lymphatic: Right inguinal and external iliac chain adenopathy, likely reactive. Largest lymph node in the right external iliac region image 69/303 measures 16 mm in short axis. No significant vascular findings. Reproductive: Prior hysterectomy. The right ovary measures 4.2 x 2.7 x 2.7 cm, with a normal appearing 1.6 cm follicle identified. The left ovary measures 2.3 x 2.2 x 1.4 cm. Other: No free fluid or free intraperitoneal gas. No abdominal wall hernia. Musculoskeletal: There is extensive subcutaneous fat stranding involving the right inguinal crease, extending into the right side of the mons pubis. Ill-defined fluid is seen within the subcutaneous tissues, but there is no organized fluid collection or abscess at this time. No acute or destructive bony abnormalities. Reconstructed images demonstrate no additional findings. IMPRESSION: 1. Extensive subcutaneous fat stranding with ill-defined fluid in the subcutaneous tissues involving the right inguinal region and extending into the right side of the mons pubis, most consistent with cellulitis. There is no organized fluid collection or abscess at this time. 2. Reactive adenopathy within the right inguinal and external iliac chains as above. Electronically Signed   By: Sharlet Salina M.D.   On: 01/09/2024 12:14    EKG: Pending.   Assessment/Plan:   Principal Problem:   Cellulitis of pubic region. Start Vancomycin.  Given the severity of her cellulitis, will need close monitoring until she shows signs of improvement and regression of the swelling/erythema. Active Problems:   Rheumatoid arthritis (HCC) Not currently on active treatment.   Anxiety She reports she takes Ativan 1 mg twice daily but her med list indicates she takes 5 mg of Valium twice  daily.  Will continue Ativan 1 mg twice daily as needed.   Prolonged QT interval EKG ordered.   Bipolar I disorder (HCC) Continue Invega.   Uncontrolled type 2 diabetes mellitus with hyperglycemia, without long-term current use of insulin (HCC) Continue metformin and add SSI.  Check hemoglobin A1c.   Hypokalemia Repleted in the ED.  Will recheck potassium in the morning.   Obesity Body mass index is 30.77 kg/m.  Carbohydrate modified diet ordered.  Other information:   DVT prophylaxis: Lovenox ordered. Code Status: Full code. Family Communication: Patient alone in the room and is competent to make medical decisions on her own behalf. Disposition Plan: Home when cellulitis shows signs of physical regression and response to therapy. Consults called: None. Admission status: Observation.   The medical decision making is of moderate complexity, therefore this is a level 2 visit.  Trula Ore Viktorya Arguijo [facility] Pager [phone]   How to contact the [facility] Attending or Consulting provider 7A - 7P or covering provider during after hours 7P -7A, for this patient?   Check the care team in [facility] and look for a) attending/consulting [facility] provider listed and b) the [facility] team listed Log into [email or web address] and use [place]'s universal password to access. If you do not have the password, please contact  the hospital operator. Locate the [facility] provider you are looking for under [facility] and page to a number that you can be directly reached. If you still have difficulty reaching the provider, please page the [facility] (Director on Call) for the Hospitalists listed on amion for assistance.  01/09/2024, 4:10 PM

## 2024-01-09 NOTE — ED Notes (Signed)
[facility] at bedside to assume care of patient. 

## 2024-01-09 NOTE — ED Triage Notes (Signed)
 Abscess to right pubic to groin area x 5 days , bloody drainage per pt . Uncomfortable ambulation due to pain . Denies fever .

## 2024-01-09 NOTE — ED Notes (Signed)
 ED Provider at bedside.

## 2024-01-09 NOTE — ED Notes (Signed)
 ED TO INPATIENT HANDOFF REPORT  ED Nurse Name and Phone #: Bertil Brickey, MSN, RN, PCCN [phone]  S Name/Age/Gender Erika Rhodes 43 y.o. female Room/Bed: MH02/MH02  Code Status   Code Status: Prior  Home/SNF/Other Home Patient oriented to: self, place, time, and situation Is this baseline? Yes   Triage Complete: Triage complete  Chief Complaint Cellulitis [L03.90]  Triage Note Abscess to right pubic to groin area x 5 days , bloody drainage per pt . Uncomfortable ambulation due to pain . Denies fever .    Allergies Allergies  Allergen Reactions   Z-Pak [Azithromycin] Anaphylaxis   Geodon [Ziprasidone] Swelling and Other (See Comments)    Dystonic reaction, slurred speech, unable to walk.    Haldol [Haloperidol] Swelling and Other (See Comments)    Dystonic reaction, slurred speech, unable to walk.    Olanzapine Swelling    Dystonic reactiom   Rexulti [Brexpiprazole] Swelling and Other (See Comments)    Dystonic reaction, slurred speech, unable to walk.    Risperidone And Related Swelling and Other (See Comments)    Dystonic reaction, slurred speech, unable to walk.     Level of Care/Admitting Diagnosis ED Disposition     ED Disposition  Admit   Condition  --   Comment  Hospital Area: [facility] [[id number]]  Level of Care: Med-Surg [16]  Interfacility transfer: Yes  May place patient in observation at [facility] or [facility] if equivalent level of care is available:: Yes  Covid Evaluation: Asymptomatic - no recent exposure (last 10 days) testing not required  Diagnosis: Cellulitis [[id number]]  Admitting Physician: Arlean Hopping [[id number]]  Attending Physician: Arlean Hopping [[id number]]          B Medical/Surgery History Past Medical History:  Diagnosis Date   Allergic reaction    Anxiety    Asthma    Benzodiazepine-induced psychosis, 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  A2GDM on Insulin   Pregnancy regimen:   Date started Early Pregnancy; Dose N-70 units QAM, N 38 units QHS; Humalog 18 units premeal     Baseline A1C (if diagnosed < 24 weeks) 8.1% on 08/2014  Baseline HELLP panel and upc (if diagnosed < than 24 weeks):  reviewed and normal  Growth ultrasound at 30-32 weeks reviewed and notable for [facility] 9   History of cesarean section 05/24/2014   Formatting of this note might be different from the original.  Overview:   C/s with g1, two term VBACs following c/s  Desires TOLAC  Previous CD times 1 for failure to progress.   Operative report(s): unavailable.  Options for mode of delivery discussed with patient and patient: desires TOLAC.    TOLAC consents signed: yes. Signed today 1/28   Intentional overdose (HCC) 09/01/2021   Migraines    PTSD (post-traumatic stress disorder) 01/12/2021   Rheumatoid arthritis (HCC) 2016   Diagnosed Dr.  Patsi Sears; has been treated with MTX, Humira, etc, but made her sick.  Was taking CBD oil and stopped as tested positive in a drug screen for work.   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   Past Surgical History:  Procedure Laterality Date   ABDOMINAL HYSTERECTOMY  2017   Fibroids; Both ovaries  intact still   BACK SURGERY     CESAREAN SECTION  2009   CHOLECYSTECTOMY  2010   laparoscopic   TONSILLECTOMY       A IV Location/Drains/Wounds Patient Lines/Drains/Airways Status     Active Line/Drains/Airways     Name Placement date Placement time Site Days   Peripheral IV 01/09/24 20 G Right Antecubital 01/09/24  1012  Antecubital  less than 1            Intake/Output Last 24 hours No intake or output data in the 24 hours ending  01/09/24 1437  Labs/Imaging Results for orders placed or performed during the hospital encounter of 01/09/24 (from the past 48 hours)  Basic metabolic panel     Status: Abnormal   Collection Time: 01/09/24 10:04 AM  Result Value Ref Range   Sodium 136 135 - 145 mmol/L   Potassium 3.0 (L) 3.5 - 5.1 mmol/L   Chloride 98 98 - 111 mmol/L   CO2 27 22 - 32 mmol/L   Glucose, Bld 166 (H) 70 - 99 mg/dL    Comment: Glucose reference range applies only to samples taken after fasting for at least 8 hours.   BUN 8 6 - 20 mg/dL   Creatinine, Ser 1.61 0.44 - 1.00 mg/dL   Calcium 8.8 (L) 8.9 - 10.3 mg/dL   GFR, Estimated >09 >60 mL/min    Comment: (NOTE) Calculated using the CKD-EPI Creatinine Equation (2021)    Anion gap 11 5 - 15    Comment: Performed at [facility], [address] [place], [place] [address]  CBC with Differential     Status: Abnormal   Collection Time: 01/09/24 10:04 AM  Result Value Ref Range   WBC 12.2 (H) 4.0 - 10.5 K/uL   RBC 4.59 3.87 - 5.11 MIL/uL   Hemoglobin 14.2 12.0 - 15.0 g/dL   HCT 81.1 91.4 - 78.2 %   MCV 86.5 80.0 - 100.0 fL   MCH 30.9 26.0 - 34.0 pg   MCHC 35.8 30.0 - 36.0 g/dL   RDW 95.6 21.3 - 08.6 %   Platelets 292 150 - 400 K/uL   nRBC 0.0 0.0 - 0.2 %   Neutrophils Relative % 82 %   Neutro Abs 10.1 (H) 1.7 - 7.7 K/uL   Lymphocytes Relative 11 %   Lymphs Abs 1.4 0.7 - 4.0 K/uL   Monocytes Relative 5 %   Monocytes Absolute 0.6 0.1 - 1.0 K/uL   Eosinophils Relative 1 %   Eosinophils Absolute 0.1 0.0 - 0.5 K/uL   Basophils Relative 0 %   Basophils Absolute 0.0 0.0 - 0.1 K/uL   Immature Granulocytes 1 %   Abs Immature Granulocytes 0.06 0.00 - 0.07 K/uL    Comment: Performed at [facility], [address] [place], [place] [address]   CT PELVIS W CONTRAST Result Date: 01/09/2024 CLINICAL DATA:  Abscess in right pubic and inguinal region for 5 days, bloody drainage, pain with ambulation EXAM: CT PELVIS WITH CONTRAST TECHNIQUE:  Multidetector CT imaging of the pelvis was performed using the standard protocol following the bolus administration of intravenous contrast. RADIATION DOSE REDUCTION: This exam was performed according to the departmental dose-optimization program which includes automated exposure control, adjustment of the mA and/or kV according to patient size and/or use of iterative reconstruction technique. CONTRAST:  75mL OMNIPAQUE IOHEXOL 300 MG/ML  SOLN COMPARISON:  11/29/2020 FINDINGS: Urinary Tract: The distal ureters and bladder are unremarkable. No urinary  tract calculi. Bowel: No bowel obstruction or ileus. Normal appendix right lower quadrant. No bowel wall thickening or inflammatory change. Vascular/Lymphatic: Right inguinal and external iliac chain adenopathy, likely reactive. Largest lymph node in the right external iliac region image 69/303 measures 16 mm in short axis. No significant vascular findings. Reproductive: Prior hysterectomy. The right ovary measures 4.2 x 2.7 x 2.7 cm, with a normal appearing 1.6 cm follicle identified. The left ovary measures 2.3 x 2.2 x 1.4 cm. Other: No Keturah Yerby fluid or Stefania Goulart intraperitoneal gas. No abdominal wall hernia. Musculoskeletal: There is extensive subcutaneous fat stranding involving the right inguinal crease, extending into the right side of the mons pubis. Ill-defined fluid is seen within the subcutaneous tissues, but there is no organized fluid collection or abscess at this time. No acute or destructive bony abnormalities. Reconstructed images demonstrate no additional findings. IMPRESSION: 1. Extensive subcutaneous fat stranding with ill-defined fluid in the subcutaneous tissues involving the right inguinal region and extending into the right side of the mons pubis, most consistent with cellulitis. There is no organized fluid collection or abscess at this time. 2. Reactive adenopathy within the right inguinal and external iliac chains as above. Electronically Signed   By:  Sharlet Salina M.D.   On: 01/09/2024 12:14    Pending Labs Unresulted Labs (From admission, onward)    None       Vitals/Pain Today's Vitals   01/09/24 1037 01/09/24 1130 01/09/24 1341 01/09/24 1413  BP:  111/72    Pulse:  89    Resp:  18    Temp:    98.6 F (37 C)  TempSrc:    Oral  SpO2:  100%    Weight:      PainSc: 8   9      Isolation Precautions No active isolations  Medications Medications  vancomycin (VANCOCIN) IVPB 1000 mg/200 mL premix (1,000 mg Intravenous New Bag/Given 01/09/24 1341)  0.9 %  sodium chloride infusion ( Intravenous New Bag/Given 01/09/24 1340)  fentaNYL (SUBLIMAZE) injection 50 mcg (50 mcg Intravenous Given 01/09/24 1037)  iohexol (OMNIPAQUE) 300 MG/ML solution 75 mL (75 mLs Intravenous Contrast Given 01/09/24 1143)  potassium chloride SA (KLOR-CON M) CR tablet 40 mEq (40 mEq Oral Given 01/09/24 1340)  fentaNYL (SUBLIMAZE) injection 50 mcg (50 mcg Intravenous Given 01/09/24 1341)    Mobility walks     Focused Assessments A+OX4, VSS, NAD noted   R Recommendations: See Admitting Provider Note  Report given to: November, RN  Additional Notes:

## 2024-01-10 DIAGNOSIS — F319 Bipolar disorder, unspecified: Secondary | ICD-10-CM | POA: Diagnosis present

## 2024-01-10 DIAGNOSIS — Z888 Allergy status to other drugs, medicaments and biological substances status: Secondary | ICD-10-CM | POA: Diagnosis not present

## 2024-01-10 DIAGNOSIS — L0390 Cellulitis, unspecified: Secondary | ICD-10-CM | POA: Diagnosis present

## 2024-01-10 DIAGNOSIS — B9562 Methicillin resistant Staphylococcus aureus infection as the cause of diseases classified elsewhere: Secondary | ICD-10-CM | POA: Diagnosis present

## 2024-01-10 DIAGNOSIS — L03314 Cellulitis of groin: Secondary | ICD-10-CM | POA: Diagnosis present

## 2024-01-10 DIAGNOSIS — R9431 Abnormal electrocardiogram [ECG] [EKG]: Secondary | ICD-10-CM | POA: Diagnosis present

## 2024-01-10 DIAGNOSIS — Z9049 Acquired absence of other specified parts of digestive tract: Secondary | ICD-10-CM | POA: Diagnosis not present

## 2024-01-10 DIAGNOSIS — J45909 Unspecified asthma, uncomplicated: Secondary | ICD-10-CM | POA: Diagnosis present

## 2024-01-10 DIAGNOSIS — Z90710 Acquired absence of both cervix and uterus: Secondary | ICD-10-CM | POA: Diagnosis not present

## 2024-01-10 DIAGNOSIS — Z8632 Personal history of gestational diabetes: Secondary | ICD-10-CM | POA: Diagnosis not present

## 2024-01-10 DIAGNOSIS — F419 Anxiety disorder, unspecified: Secondary | ICD-10-CM | POA: Diagnosis present

## 2024-01-10 DIAGNOSIS — F17290 Nicotine dependence, other tobacco product, uncomplicated: Secondary | ICD-10-CM | POA: Diagnosis present

## 2024-01-10 DIAGNOSIS — L02219 Cutaneous abscess of trunk, unspecified: Secondary | ICD-10-CM

## 2024-01-10 DIAGNOSIS — Z98891 History of uterine scar from previous surgery: Secondary | ICD-10-CM | POA: Diagnosis not present

## 2024-01-10 DIAGNOSIS — E1165 Type 2 diabetes mellitus with hyperglycemia: Secondary | ICD-10-CM | POA: Diagnosis present

## 2024-01-10 DIAGNOSIS — L03818 Cellulitis of other sites: Secondary | ICD-10-CM | POA: Diagnosis present

## 2024-01-10 DIAGNOSIS — Z79899 Other long term (current) drug therapy: Secondary | ICD-10-CM | POA: Diagnosis not present

## 2024-01-10 DIAGNOSIS — Z9151 Personal history of suicidal behavior: Secondary | ICD-10-CM | POA: Diagnosis not present

## 2024-01-10 DIAGNOSIS — Z8261 Family history of arthritis: Secondary | ICD-10-CM | POA: Diagnosis not present

## 2024-01-10 DIAGNOSIS — Z7984 Long term (current) use of oral hypoglycemic drugs: Secondary | ICD-10-CM | POA: Diagnosis not present

## 2024-01-10 DIAGNOSIS — Z818 Family history of other mental and behavioral disorders: Secondary | ICD-10-CM | POA: Diagnosis not present

## 2024-01-10 DIAGNOSIS — M069 Rheumatoid arthritis, unspecified: Secondary | ICD-10-CM | POA: Diagnosis present

## 2024-01-10 DIAGNOSIS — Z6830 Body mass index (BMI) 30.0-30.9, adult: Secondary | ICD-10-CM | POA: Diagnosis not present

## 2024-01-10 DIAGNOSIS — E876 Hypokalemia: Secondary | ICD-10-CM | POA: Diagnosis present

## 2024-01-10 DIAGNOSIS — E66811 Obesity, class 1: Secondary | ICD-10-CM | POA: Diagnosis present

## 2024-01-10 DIAGNOSIS — Z7952 Long term (current) use of systemic steroids: Secondary | ICD-10-CM | POA: Diagnosis not present

## 2024-01-10 LAB — BASIC METABOLIC PANEL
Anion gap: 10 (ref 5–15)
BUN: 7 mg/dL (ref 6–20)
CO2: 25 mmol/L (ref 22–32)
Calcium: 8.6 mg/dL — ABNORMAL LOW (ref 8.9–10.3)
Chloride: 102 mmol/L (ref 98–111)
Creatinine, Ser: 0.57 mg/dL (ref 0.44–1.00)
GFR, Estimated: >60 mL/min (ref >60)
Glucose, Bld: 116 mg/dL — ABNORMAL HIGH (ref 70–99)
Potassium: 3.2 mmol/L — ABNORMAL LOW (ref 3.5–5.1)
Sodium: 137 mmol/L (ref 135–145)

## 2024-01-10 LAB — CBC
HCT: 34.3 % — ABNORMAL LOW (ref 36.0–46.0)
Hemoglobin: 12 g/dL (ref 12.0–15.0)
MCH: 31.2 pg (ref 26.0–34.0)
MCHC: 35 g/dL (ref 30.0–36.0)
MCV: 89.1 fL (ref 80.0–100.0)
Platelets: 251 10*3/uL (ref 150–400)
RBC: 3.85 MIL/uL — ABNORMAL LOW (ref 3.87–5.11)
RDW: 12 % (ref 11.5–15.5)
WBC: 8.9 10*3/uL (ref 4.0–10.5)
nRBC: 0 % (ref 0.0–0.2)

## 2024-01-10 LAB — GLUCOSE, CAPILLARY
Glucose-Capillary: 111 mg/dL — ABNORMAL HIGH (ref 70–99)
Glucose-Capillary: 117 mg/dL — ABNORMAL HIGH (ref 70–99)
Glucose-Capillary: 116 mg/dL — ABNORMAL HIGH (ref 70–99)

## 2024-01-10 LAB — MRSA NEXT GEN BY PCR, NASAL: MRSA by PCR Next Gen: DETECTED — AB

## 2024-01-10 MED ORDER — CARIPRAZINE HCL 1.5 MG PO CAPS
3.0000 mg | ORAL_CAPSULE | Freq: Every day | ORAL | Status: DC
  Administered 2024-01-10 – 2024-01-13 (×4): 3 mg via ORAL
  Filled 2024-01-10 (×4): qty 2

## 2024-01-10 MED ORDER — POTASSIUM CHLORIDE CRYS ER 20 MEQ PO TBCR
40.0000 meq | EXTENDED_RELEASE_TABLET | ORAL | Status: AC
  Administered 2024-01-10 (×2): 40 meq via ORAL
  Filled 2024-01-10 (×2): qty 2

## 2024-01-10 MED ORDER — ALBUTEROL SULFATE (2.5 MG/3ML) 0.083% IN NEBU: 3.0000 mL | INHALATION_SOLUTION | Freq: Four times a day (QID) | RESPIRATORY_TRACT | Status: DC | PRN

## 2024-01-10 MED ORDER — PALIPERIDONE ER 6 MG PO TB24
6.0000 mg | ORAL_TABLET | Freq: Every day | ORAL | Status: DC
  Filled 2024-01-10 (×3): qty 1

## 2024-01-10 MED ORDER — INSULIN ASPART 100 UNIT/ML IJ SOLN: 0.0000 [IU] | Freq: Every day | INTRAMUSCULAR | Status: DC

## 2024-01-10 MED ORDER — INSULIN ASPART 100 UNIT/ML IJ SOLN
0.0000 [IU] | Freq: Three times a day (TID) | INTRAMUSCULAR | Status: DC
  Administered 2024-01-11: 2 [IU] via SUBCUTANEOUS

## 2024-01-10 MED ORDER — RAMELTEON 8 MG PO TABS
8.0000 mg | ORAL_TABLET | Freq: Every day | ORAL | Status: DC
  Administered 2024-01-10 – 2024-01-12 (×3): 8 mg via ORAL
  Filled 2024-01-10 (×3): qty 1

## 2024-01-10 NOTE — Plan of Care (Signed)
   Problem: Education: Goal: Knowledge of General Education information will improve Description: Including pain rating scale, medication(s)/side effects and non-pharmacologic comfort measures Outcome: Progressing   Problem: Clinical Measurements: Goal: Ability to maintain clinical measurements within normal limits will improve Outcome: Progressing

## 2024-01-10 NOTE — Progress Notes (Signed)
 PROGRESS NOTE    Erika Rhodes  WUJ:[id number] DOB: [birth year] DOA: 01/09/2024 PCP: Mendel Ryder, PA-C   Brief Narrative:  Erika Rhodes is an 43 y.o. female with a PMH of RA, DM, anxiety, asthma, bipolar disorder who presents to the hospital with suspected MRSA abscess/cellulitis.  She has a history of facial cellulitis, treated 1 year ago with Vancomycin/hospitalization, and has intermittently been on steroid therapy during rheumatoid arthritis flares.  The last time she used steroids was in early December.  She reports that she is not currently taking any maintenance medications for her rheumatoid arthritis.  The current skin eruption began around 4-5 days ago and has gotten worse over that time.  She saw a telemetry doctor who prescribed her doxycycline and she has taken this for the past 3-4 days without any noticeable improvement.  The skin on her mons pubis is erythematous with significant swelling in the area involved tracking around laterally to her hip.  There is a small ulceration that is draining serosanguineous/purulent drainage.  The only other associated symptoms she has had is some mild nausea but no vomiting and some subjective fever/chills.  Although diabetic, she does not routinely check her blood sugars and she has not had a screening A1c in about 1 year.   ED Course:  Erika Rhodes was given a dose of IV vancomycin, oral potassium for a potassium level of 3.0, and fentanyl for complaints of pain.  A CT of the affected area failed to disclose an abscess but did show significant swelling.  Labs were notable for mild leukocytosis and a potassium of 3.0, glucose 166.  Labs otherwise unremarkable.  She was subsequently referred for admission given the severity of her cellulitis.  Assessment & Plan:   Principal Problem:   Abscess of pubic region Active Problems:   Rheumatoid arthritis (HCC)   Anxiety   Prolonged QT interval   Bipolar I disorder (HCC)   Uncontrolled type 2 diabetes  mellitus with hyperglycemia, without long-term current use of insulin (HCC)   Cellulitis  Cellulitis of the pubic region: Abscess ruled out by the CT scan.  Severe cellulitis.  Leukocytosis improving and she is afebrile, has some drainage and tenderness.  Continue vancomycin.  Hypokalemia: Replenish again.  Bipolar 1 disorder: Continue Invega.  Type 2 diabetes mellitus: Apparently she is not taking her sugars at home and last hemoglobin A1c was 1 year ago which was 10.7.  She is on metformin.  Hemoglobin A1c 6.7.  I will hold metformin in the hospital setting.  She has been started on SSI and Premeal regimen which I will continue.  She appears to be taking 35 units of Lantus at home however with this much improvement of hemoglobin A1c and the fact that we are not sure about her compliance, we will hold off to long-acting insulin for now.  Class I obesity: BMI 30.77.  Weight loss and diet modification counseled.  Prolonged QTc: Avoid QTc prolonging medications and keep electrolytes within normal range.  Anxiety: Continue as needed Ativan.  DVT prophylaxis: enoxaparin (LOVENOX) injection 40 mg Start: 01/09/24 2200   Code Status: Full Code  Family Communication:  None present at bedside.  Plan of care discussed with patient in length and he/she verbalized understanding and agreed with it.  Status is: Observation The patient will require care spanning > 2 midnights and should be moved to inpatient because: She is likely going to need at least couple of days of more IV antibiotics in the hospital.   Estimated  body mass index is 30.77 kg/m as calculated from the following:   Height as of this encounter: 5\' 4"  (1.626 m).   Weight as of this encounter: 81.3 kg.    Nutritional Assessment: Body mass index is 30.77 kg/m.Marland Kitchen Seen by dietician.  I agree with the assessment and plan as outlined below: Nutrition Status:        . Skin Assessment: I have examined the patient's skin and I agree  with the wound assessment as performed by the wound care RN as outlined below:    Consultants:  None  Procedures:  None  Antimicrobials:  Anti-infectives (From admission, onward)    Start     Dose/Rate Route Frequency Ordered Stop   01/10/24 0130  vancomycin (VANCOREADY) IVPB 750 mg/150 mL        750 mg 150 mL/hr over 60 Minutes Intravenous Every 12 hours 01/09/24 1604 01/17/24 0129   01/09/24 1700  vancomycin (VANCOCIN) IVPB 1000 mg/200 mL premix  Status:  Discontinued        1,000 mg 200 mL/hr over 60 Minutes Intravenous  Once 01/09/24 1613 01/09/24 1615   01/09/24 1330  vancomycin (VANCOCIN) IVPB 1000 mg/200 mL premix        1,000 mg 200 mL/hr over 60 Minutes Intravenous  Once 01/09/24 1325 01/09/24 1441         Subjective: Patient seen and examined.  She says that overall she is feeling better however her groin pain is still the same.  She appears comfortable though.  No other complaint.  Objective: Vitals:   01/09/24 2024 01/09/24 2326 01/10/24 0008 01/10/24 0350  BP: 104/75 (!) 97/59 104/61 102/67  Pulse: 85 81 74 82  Resp: 18 20  16   Temp: 98.6 F (37 C) 98.5 F (36.9 C)  98.5 F (36.9 C)  TempSrc:  Oral  Oral  SpO2: 97% 95%  98%  Weight:      Height:        Intake/Output Summary (Last 24 hours) at 01/10/2024 0817 Last data filed at 01/10/2024 0424 Gross per 24 hour  Intake 1179.01 ml  Output --  Net 1179.01 ml   Filed Weights   01/09/24 0949 01/09/24 1551  Weight: 83.5 kg 81.3 kg    Examination:  Patient was examined in front of the female chaperone/patient's primary nurse Erika Rhodes  General exam: Appears calm and comfortable  Respiratory system: Clear to auscultation. Respiratory effort normal. Cardiovascular system: S1 & S2 heard, RRR. No JVD, murmurs, rubs, gallops or clicks. No pedal edema. Gastrointestinal system: Abdomen is nondistended, soft and nontender. No organomegaly or masses felt. Normal bowel sounds heard.  Large area of erythema and  some fluid drainage and tenderness at the groin area Central nervous system: Alert and oriented. No focal neurological deficits. Extremities: Symmetric 5 x 5 power. Skin: No rashes, lesions or ulcers Psychiatry: Judgement and insight appear normal. Mood & affect appropriate.    Data Reviewed: I have personally reviewed following labs and imaging studies  CBC: Recent Labs  Lab 01/09/24 1004 01/09/24 1643 01/10/24 0451  WBC 12.2* 10.8* 8.9  NEUTROABS 10.1*  --   --   HGB 14.2 12.4 12.0  HCT 39.7 36.7 34.3*  MCV 86.5 89.5 89.1  PLT 292 269 251   Basic Metabolic Panel: Recent Labs  Lab 01/09/24 1004 01/09/24 1643 01/10/24 0451  NA 136  --  137  K 3.0*  --  3.2*  CL 98  --  102  CO2 27  --  25  GLUCOSE 166*  --  116*  BUN 8  --  7  CREATININE 0.63 0.51 0.57  CALCIUM 8.8*  --  8.6*   GFR: Estimated Creatinine Clearance: 96.4 mL/min (by C-G formula based on SCr of 0.57 mg/dL). Liver Function Tests: No results for input(s): "AST", "ALT", "ALKPHOS", "BILITOT", "PROT", "ALBUMIN" in the last 168 hours. No results for input(s): "LIPASE", "AMYLASE" in the last 168 hours. No results for input(s): "AMMONIA" in the last 168 hours. Coagulation Profile: No results for input(s): "INR", "PROTIME" in the last 168 hours. Cardiac Enzymes: No results for input(s): "CKTOTAL", "CKMB", "CKMBINDEX", "TROPONINI" in the last 168 hours. BNP (last 3 results) No results for input(s): "PROBNP" in the last 8760 hours. HbA1C: Recent Labs    01/09/24 1643  HGBA1C 6.7*   CBG: Recent Labs  Lab 01/09/24 1744 01/09/24 2033  GLUCAP 159* 110*   Lipid Profile: No results for input(s): "CHOL", "HDL", "LDLCALC", "TRIG", "CHOLHDL", "LDLDIRECT" in the last 72 hours. Thyroid Function Tests: No results for input(s): "TSH", "T4TOTAL", "FREET4", "T3FREE", "THYROIDAB" in the last 72 hours. Anemia Panel: No results for input(s): "VITAMINB12", "FOLATE", "FERRITIN", "TIBC", "IRON", "RETICCTPCT" in the  last 72 hours. Sepsis Labs: No results for input(s): "PROCALCITON", "LATICACIDVEN" in the last 168 hours.  No results found for this or any previous visit (from the past 240 hours).   Radiology Studies: CT PELVIS W CONTRAST Result Date: 01/09/2024 CLINICAL DATA:  Abscess in right pubic and inguinal region for 5 days, bloody drainage, pain with ambulation EXAM: CT PELVIS WITH CONTRAST TECHNIQUE: Multidetector CT imaging of the pelvis was performed using the standard protocol following the bolus administration of intravenous contrast. RADIATION DOSE REDUCTION: This exam was performed according to the departmental dose-optimization program which includes automated exposure control, adjustment of the mA and/or kV according to patient size and/or use of iterative reconstruction technique. CONTRAST:  75mL OMNIPAQUE IOHEXOL 300 MG/ML  SOLN COMPARISON:  11/29/2020 FINDINGS: Urinary Tract: The distal ureters and bladder are unremarkable. No urinary tract calculi. Bowel: No bowel obstruction or ileus. Normal appendix right lower quadrant. No bowel wall thickening or inflammatory change. Vascular/Lymphatic: Right inguinal and external iliac chain adenopathy, likely reactive. Largest lymph node in the right external iliac region image 69/303 measures 16 mm in short axis. No significant vascular findings. Reproductive: Prior hysterectomy. The right ovary measures 4.2 x 2.7 x 2.7 cm, with a normal appearing 1.6 cm follicle identified. The left ovary measures 2.3 x 2.2 x 1.4 cm. Other: No free fluid or free intraperitoneal gas. No abdominal wall hernia. Musculoskeletal: There is extensive subcutaneous fat stranding involving the right inguinal crease, extending into the right side of the mons pubis. Ill-defined fluid is seen within the subcutaneous tissues, but there is no organized fluid collection or abscess at this time. No acute or destructive bony abnormalities. Reconstructed images demonstrate no additional findings.  IMPRESSION: 1. Extensive subcutaneous fat stranding with ill-defined fluid in the subcutaneous tissues involving the right inguinal region and extending into the right side of the mons pubis, most consistent with cellulitis. There is no organized fluid collection or abscess at this time. 2. Reactive adenopathy within the right inguinal and external iliac chains as above. Electronically Signed   By: Sharlet Salina M.D.   On: 01/09/2024 12:14    Scheduled Meds:  enoxaparin (LOVENOX) injection  40 mg Subcutaneous QHS   insulin aspart  0-15 Units Subcutaneous TID WC   insulin aspart  0-5 Units Subcutaneous QHS   insulin aspart  8 Units Subcutaneous TID WC   potassium chloride  40 mEq Oral Q4H   sodium chloride flush  3 mL Intravenous Q12H   Continuous Infusions:  sodium chloride Stopped (01/09/24 1519)   vancomycin Stopped (01/10/24 0157)     LOS: 0 days   Hughie Closs, MD [facility]  01/10/2024, 8:17 AM   *Please note that this is a verbal dictation therefore any spelling or grammatical errors are due to the "[employer] One" system interpretation.  Please page via Amion and do not message via secure chat for urgent patient care matters. Secure chat can be used for non urgent patient care matters.  How to contact the [facility] Attending or Consulting provider 7A - 7P or covering provider during after hours 7P -7A, for this patient?  Check the care team in [facility] and look for a) attending/consulting [facility] provider listed and b) the [facility] team listed. Page or secure chat 7A-7P. Log into [email or web address] and use [place]'s universal password to access. If you do not have the password, please contact the hospital operator. Locate the [facility] provider you are looking for under [facility] and page to a number that you can be directly reached. If you still have difficulty reaching the provider, please page the [facility] (Director on Call) for the Hospitalists listed on amion for assistance.

## 2024-01-10 NOTE — Plan of Care (Signed)

## 2024-01-11 DIAGNOSIS — L02219 Cutaneous abscess of trunk, unspecified: Secondary | ICD-10-CM | POA: Diagnosis not present

## 2024-01-11 LAB — GLUCOSE, CAPILLARY
Glucose-Capillary: 97 mg/dL (ref 70–99)
Glucose-Capillary: 133 mg/dL — ABNORMAL HIGH (ref 70–99)
Glucose-Capillary: 123 mg/dL — ABNORMAL HIGH (ref 70–99)
Glucose-Capillary: 112 mg/dL — ABNORMAL HIGH (ref 70–99)

## 2024-01-11 NOTE — Plan of Care (Signed)

## 2024-01-11 NOTE — Progress Notes (Addendum)
 PROGRESS NOTE    Erika Rhodes  ZOX:[id number] DOB: 05-May-1981 DOA: 01/09/2024 PCP: Mendel Ryder, PA-C   Brief Narrative:  Erika Rhodes is an 43 y.o. female with a PMH of RA, DM, anxiety, asthma, bipolar disorder who presents to the hospital with suspected MRSA abscess/cellulitis.  She has a history of facial cellulitis, treated 1 year ago with Vancomycin/hospitalization, and has intermittently been on steroid therapy during rheumatoid arthritis flares.  The last time she used steroids was in early December.  She reports that she is not currently taking any maintenance medications for her rheumatoid arthritis.  The current skin eruption began around 4-5 days ago and has gotten worse over that time.  She saw a telemetry doctor who prescribed her doxycycline and she has taken this for the past 3-4 days without any noticeable improvement.  The skin on her mons pubis is erythematous with significant swelling in the area involved tracking around laterally to her hip.  There is a small ulceration that is draining serosanguineous/purulent drainage.  The only other associated symptoms she has had is some mild nausea but no vomiting and some subjective fever/chills.  Although diabetic, she does not routinely check her blood sugars and she has not had a screening A1c in about 1 year.   ED Course:  Rhett was given a dose of IV vancomycin, oral potassium for a potassium level of 3.0, and fentanyl for complaints of pain.  A CT of the affected area failed to disclose an abscess but did show significant swelling.  Labs were notable for mild leukocytosis and a potassium of 3.0, glucose 166.  Labs otherwise unremarkable.  She was subsequently referred for admission given the severity of her cellulitis.  Assessment & Plan:   Principal Problem:   Abscess of pubic region Active Problems:   Rheumatoid arthritis (HCC)   Anxiety   Prolonged QT interval   Bipolar I disorder (HCC)   Uncontrolled type 2 diabetes  mellitus with hyperglycemia, without long-term current use of insulin (HCC)   Cellulitis   Cellulitis of groin  Cellulitis of the pubic region: Abscess ruled out by the CT scan.  Severe cellulitis.  Leukocytosis resolved and she is afebrile, has some more drainage but less tenderness and erythema improving.  Continue vancomycin.  Hypokalemia: Replenished.  Bipolar 1 disorder: Continue Invega.  Type 2 diabetes mellitus: Apparently she is not taking her sugars at home and last hemoglobin A1c was 1 year ago which was 10.7.  She is on metformin.  Hemoglobin A1c 6.7.  I will hold metformin in the hospital setting.  She has been started on SSI and Premeal regimen/8 units of NovoLog 3 times daily which I will continue.  She appears to be taking 35 units of Lantus at home however with this much improvement of hemoglobin A1c and the fact that we are not sure about her compliance, we will hold off to long-acting insulin for now.  Class I obesity: BMI 30.77.  Weight loss and diet modification counseled.  Prolonged QTc: Avoid QTc prolonging medications and keep electrolytes within normal range.  Anxiety: Continue as needed Ativan.  DVT prophylaxis: enoxaparin (LOVENOX) injection 40 mg Start: 01/09/24 2200   Code Status: Full Code  Family Communication:  None present at bedside.  Plan of care discussed with patient in length and he/she verbalized understanding and agreed with it.  Status is: Inpatient Remains inpatient appropriate because: Will need IV antibiotics for at least 1-2 more days.     Estimated body mass index  is 30.77 kg/m as calculated from the following:   Height as of this encounter: 5\' 4"  (1.626 m).   Weight as of this encounter: 81.3 kg.    Nutritional Assessment: Body mass index is 30.77 kg/m.Marland Kitchen Seen by dietician.  I agree with the assessment and plan as outlined below: Nutrition Status:        . Skin Assessment: I have examined the patient's skin and I agree with the  wound assessment as performed by the wound care RN as outlined below:    Consultants:  None  Procedures:  None  Antimicrobials:  Anti-infectives (From admission, onward)    Start     Dose/Rate Route Frequency Ordered Stop   01/10/24 0130  vancomycin (VANCOREADY) IVPB 750 mg/150 mL        750 mg 150 mL/hr over 60 Minutes Intravenous Every 12 hours 01/09/24 1604 01/17/24 0129   01/09/24 1700  vancomycin (VANCOCIN) IVPB 1000 mg/200 mL premix  Status:  Discontinued        1,000 mg 200 mL/hr over 60 Minutes Intravenous  Once 01/09/24 1613 01/09/24 1615   01/09/24 1330  vancomycin (VANCOCIN) IVPB 1000 mg/200 mL premix        1,000 mg 200 mL/hr over 60 Minutes Intravenous  Once 01/09/24 1325 01/09/24 1441         Subjective: And examined.  Continues to improve.  She says that her pain is improving.  No new complaint.  Objective: Vitals:   01/10/24 1146 01/10/24 1824 01/10/24 2042 01/11/24 0448  BP: (!) 94/55 108/60 112/67 102/63  Pulse: 78 84 81 77  Resp: 18 18 18 17   Temp: 98.2 F (36.8 C) 98.2 F (36.8 C) 97.9 F (36.6 C) 97.9 F (36.6 C)  TempSrc: Oral Oral Oral Oral  SpO2: 98% 97% 95% 98%  Weight:      Height:        Intake/Output Summary (Last 24 hours) at 01/11/2024 0908 Last data filed at 01/11/2024 0904 Gross per 24 hour  Intake 870 ml  Output --  Net 870 ml   Filed Weights   01/09/24 0949 01/09/24 1551  Weight: 83.5 kg 81.3 kg    Examination:  General exam: Appears calm and comfortable  Respiratory system: Clear to auscultation. Respiratory effort normal. Cardiovascular system: S1 & S2 heard, RRR. No JVD, murmurs, rubs, gallops or clicks. No pedal edema. Gastrointestinal system: Abdomen is nondistended, soft and nontender. No organomegaly or masses felt. Normal bowel sounds heard. Central nervous system: Alert and oriented. No focal neurological deficits. Extremities: Symmetric 5 x 5 power. Skin: No rashes, lesions or ulcers.  Psychiatry: Judgement  and insight appear normal. Mood & affect appropriate.    Data Reviewed: I have personally reviewed following labs and imaging studies  CBC: Recent Labs  Lab 01/09/24 1004 01/09/24 1643 01/10/24 0451  WBC 12.2* 10.8* 8.9  NEUTROABS 10.1*  --   --   HGB 14.2 12.4 12.0  HCT 39.7 36.7 34.3*  MCV 86.5 89.5 89.1  PLT 292 269 251   Basic Metabolic Panel: Recent Labs  Lab 01/09/24 1004 01/09/24 1643 01/10/24 0451  NA 136  --  137  K 3.0*  --  3.2*  CL 98  --  102  CO2 27  --  25  GLUCOSE 166*  --  116*  BUN 8  --  7  CREATININE 0.63 0.51 0.57  CALCIUM 8.8*  --  8.6*   GFR: Estimated Creatinine Clearance: 96.4 mL/min (by C-G formula based  on SCr of 0.57 mg/dL). Liver Function Tests: No results for input(s): "AST", "ALT", "ALKPHOS", "BILITOT", "PROT", "ALBUMIN" in the last 168 hours. No results for input(s): "LIPASE", "AMYLASE" in the last 168 hours. No results for input(s): "AMMONIA" in the last 168 hours. Coagulation Profile: No results for input(s): "INR", "PROTIME" in the last 168 hours. Cardiac Enzymes: No results for input(s): "CKTOTAL", "CKMB", "CKMBINDEX", "TROPONINI" in the last 168 hours. BNP (last 3 results) No results for input(s): "PROBNP" in the last 8760 hours. HbA1C: Recent Labs    01/09/24 1643  HGBA1C 6.7*   CBG: Recent Labs  Lab 01/09/24 2033 01/10/24 1138 01/10/24 1650 01/10/24 2045 01/11/24 0810  GLUCAP 110* 111* 117* 116* 97   Lipid Profile: No results for input(s): "CHOL", "HDL", "LDLCALC", "TRIG", "CHOLHDL", "LDLDIRECT" in the last 72 hours. Thyroid Function Tests: No results for input(s): "TSH", "T4TOTAL", "FREET4", "T3FREE", "THYROIDAB" in the last 72 hours. Anemia Panel: No results for input(s): "VITAMINB12", "FOLATE", "FERRITIN", "TIBC", "IRON", "RETICCTPCT" in the last 72 hours. Sepsis Labs: No results for input(s): "PROCALCITON", "LATICACIDVEN" in the last 168 hours.  Recent Results (from the past 240 hours)  MRSA Next Gen by  PCR, Nasal     Status: Abnormal   Collection Time: 01/10/24 12:50 PM   Specimen: Nasal Mucosa; Nasal Swab  Result Value Ref Range Status   MRSA by PCR Next Gen DETECTED (A) NOT DETECTED Final    Comment: RESULT CALLED TO, READ BACK BY AND VERIFIED WITH: ROSE, B RN AT 1652 ON 01/10/2024 BY Deedra Ehrich, K (NOTE) The GeneXpert MRSA Assay (FDA approved for NASAL specimens only), is one component of a comprehensive MRSA colonization surveillance program. It is not intended to diagnose MRSA infection nor to guide or monitor treatment for MRSA infections. Test performance is not FDA approved in patients less than 15 years old. Performed at [facility], [address], [place], [place] [address]      Radiology Studies: CT PELVIS W CONTRAST Result Date: 01/09/2024 CLINICAL DATA:  Abscess in right pubic and inguinal region for 5 days, bloody drainage, pain with ambulation EXAM: CT PELVIS WITH CONTRAST TECHNIQUE: Multidetector CT imaging of the pelvis was performed using the standard protocol following the bolus administration of intravenous contrast. RADIATION DOSE REDUCTION: This exam was performed according to the departmental dose-optimization program which includes automated exposure control, adjustment of the mA and/or kV according to patient size and/or use of iterative reconstruction technique. CONTRAST:  75mL OMNIPAQUE IOHEXOL 300 MG/ML  SOLN COMPARISON:  11/29/2020 FINDINGS: Urinary Tract: The distal ureters and bladder are unremarkable. No urinary tract calculi. Bowel: No bowel obstruction or ileus. Normal appendix right lower quadrant. No bowel wall thickening or inflammatory change. Vascular/Lymphatic: Right inguinal and external iliac chain adenopathy, likely reactive. Largest lymph node in the right external iliac region image 69/303 measures 16 mm in short axis. No significant vascular findings. Reproductive: Prior hysterectomy. The right ovary measures 4.2 x 2.7 x 2.7 cm, with a  normal appearing 1.6 cm follicle identified. The left ovary measures 2.3 x 2.2 x 1.4 cm. Other: No free fluid or free intraperitoneal gas. No abdominal wall hernia. Musculoskeletal: There is extensive subcutaneous fat stranding involving the right inguinal crease, extending into the right side of the mons pubis. Ill-defined fluid is seen within the subcutaneous tissues, but there is no organized fluid collection or abscess at this time. No acute or destructive bony abnormalities. Reconstructed images demonstrate no additional findings. IMPRESSION: 1. Extensive subcutaneous fat stranding with ill-defined fluid  in the subcutaneous tissues involving the right inguinal region and extending into the right side of the mons pubis, most consistent with cellulitis. There is no organized fluid collection or abscess at this time. 2. Reactive adenopathy within the right inguinal and external iliac chains as above. Electronically Signed   By: Sharlet Salina M.D.   On: 01/09/2024 12:14    Scheduled Meds:  cariprazine  3 mg Oral Daily   enoxaparin (LOVENOX) injection  40 mg Subcutaneous QHS   insulin aspart  0-15 Units Subcutaneous TID WC   insulin aspart  0-5 Units Subcutaneous QHS   insulin aspart  8 Units Subcutaneous TID WC   paliperidone  6 mg Oral QHS   ramelteon  8 mg Oral QHS   sodium chloride flush  3 mL Intravenous Q12H   Continuous Infusions:  vancomycin 750 mg (01/11/24 0107)     LOS: 1 day   Hughie Closs, MD [facility]  01/11/2024, 9:08 AM   *Please note that this is a verbal dictation therefore any spelling or grammatical errors are due to the "[employer]agon [employer] One" system interpretation.  Please page via Amion and do not message via secure chat for urgent patient care matters. Secure chat can be used for non urgent patient care matters.  How to contact the [facility] Attending or Consulting provider 7A - 7P or covering provider during after hours 7P -7A, for this patient?  Check the care  team in [facility] and look for a) attending/consulting [facility] provider listed and b) the [facility] team listed. Page or secure chat 7A-7P. Log into [email or web address] and use [place]'s universal password to access. If you do not have the password, please contact the hospital operator. Locate the [facility] provider you are looking for under [facility] and page to a number that you can be directly reached. If you still have difficulty reaching the provider, please page the [facility] (Director on Call) for the Hospitalists listed on amion for assistance.

## 2024-01-12 DIAGNOSIS — L02219 Cutaneous abscess of trunk, unspecified: Secondary | ICD-10-CM | POA: Diagnosis not present

## 2024-01-12 LAB — BASIC METABOLIC PANEL
Anion gap: 9 (ref 5–15)
BUN: 8 mg/dL (ref 6–20)
CO2: 24 mmol/L (ref 22–32)
Calcium: 8.5 mg/dL — ABNORMAL LOW (ref 8.9–10.3)
Chloride: 98 mmol/L (ref 98–111)
Creatinine, Ser: 0.51 mg/dL (ref 0.44–1.00)
GFR, Estimated: >60 mL/min (ref >60)
Glucose, Bld: 103 mg/dL — ABNORMAL HIGH (ref 70–99)
Potassium: 3.9 mmol/L (ref 3.5–5.1)
Sodium: 131 mmol/L — ABNORMAL LOW (ref 135–145)

## 2024-01-12 LAB — GLUCOSE, CAPILLARY
Glucose-Capillary: 81 mg/dL (ref 70–99)
Glucose-Capillary: 147 mg/dL — ABNORMAL HIGH (ref 70–99)
Glucose-Capillary: 89 mg/dL (ref 70–99)
Glucose-Capillary: 108 mg/dL — ABNORMAL HIGH (ref 70–99)

## 2024-01-12 LAB — CBC WITH DIFFERENTIAL/PLATELET
Abs Immature Granulocytes: 0.02 10*3/uL (ref 0.00–0.07)
Basophils Absolute: 0 10*3/uL (ref 0.0–0.1)
Basophils Relative: 0 %
Eosinophils Absolute: 0 10*3/uL (ref 0.0–0.5)
Eosinophils Relative: 1 %
HCT: 36.4 % (ref 36.0–46.0)
Hemoglobin: 12.3 g/dL (ref 12.0–15.0)
Immature Granulocytes: 0 %
Lymphocytes Relative: 21 %
Lymphs Abs: 1 10*3/uL (ref 0.7–4.0)
MCH: 30.5 pg (ref 26.0–34.0)
MCHC: 33.8 g/dL (ref 30.0–36.0)
MCV: 90.3 fL (ref 80.0–100.0)
Monocytes Absolute: 0.4 10*3/uL (ref 0.1–1.0)
Monocytes Relative: 8 %
Neutro Abs: 3.5 10*3/uL (ref 1.7–7.7)
Neutrophils Relative %: 70 %
Platelets: 262 10*3/uL (ref 150–400)
RBC: 4.03 MIL/uL (ref 3.87–5.11)
RDW: 11.8 % (ref 11.5–15.5)
WBC: 5 10*3/uL (ref 4.0–10.5)
nRBC: 0 % (ref 0.0–0.2)

## 2024-01-12 MED ORDER — CHLORHEXIDINE GLUCONATE CLOTH 2 % EX PADS
6.0000 | MEDICATED_PAD | Freq: Every day | CUTANEOUS | Status: DC
Start: 2024-01-12 — End: 2024-01-17
  Administered 2024-01-12 – 2024-01-13 (×2): 6 via TOPICAL

## 2024-01-12 MED ORDER — MUPIROCIN 2 % EX OINT
1.0000 | TOPICAL_OINTMENT | Freq: Two times a day (BID) | CUTANEOUS | Status: DC
  Administered 2024-01-12 – 2024-01-13 (×3): 1 via NASAL
  Filled 2024-01-12 (×2): qty 22

## 2024-01-12 NOTE — Plan of Care (Signed)
   Problem: Activity: Goal: Risk for activity intolerance will decrease Outcome: Progressing

## 2024-01-12 NOTE — TOC CM/SW Note (Signed)
 Transition of Care [facility]) - Inpatient Brief Assessment   Patient Details  Name: Erika Rhodes MRN: [id number] Date of Birth: [birth year]  [facility] CM/SW Contact:    Larrie Kass, [name] Phone Number: 01/12/2024, 12:49 PM      Transition of Care Asessment: Insurance and Status: Insurance coverage has been reviewed Patient has primary care physician: Yes Home environment has been reviewed: home wiht self Prior level of function:: indepedent Prior/Current Home Services: No current home services Social Drivers of Health Review: SDOH reviewed no interventions necessary Readmission risk has been reviewed: Yes Transition of care needs: no transition of care needs at this time

## 2024-01-12 NOTE — Plan of Care (Signed)
  Problem: Activity: Goal: Risk for activity intolerance will decrease 01/12/2024 0721 by Caroll Rancher, RN Outcome: Progressing 01/12/2024 0721 by Caroll Rancher, RN Outcome: Progressing

## 2024-01-12 NOTE — Progress Notes (Signed)
 PROGRESS NOTE    Erika Rhodes  OZD:[id number] DOB: 1980-12-17 DOA: 01/09/2024 PCP: Mendel Ryder, PA-C   Brief Narrative:  Erika Rhodes is an 43 y.o. female with a PMH of RA, DM, anxiety, asthma, bipolar disorder who presents to the hospital with suspected MRSA abscess/cellulitis.  She has a history of facial cellulitis, treated 1 year ago with Vancomycin/hospitalization, and has intermittently been on steroid therapy during rheumatoid arthritis flares.  The last time she used steroids was in early December.  She reports that she is not currently taking any maintenance medications for her rheumatoid arthritis.  The current skin eruption began around 4-5 days ago and has gotten worse over that time.  She saw a telemetry doctor who prescribed her doxycycline and she has taken this for the past 3-4 days without any noticeable improvement.  The skin on her mons pubis is erythematous with significant swelling in the area involved tracking around laterally to her hip.  There is a small ulceration that is draining serosanguineous/purulent drainage.  The only other associated symptoms she has had is some mild nausea but no vomiting and some subjective fever/chills.  Although diabetic, she does not routinely check her blood sugars and she has not had a screening A1c in about 1 year.   ED Course:  Jaleeah was given a dose of IV vancomycin, oral potassium for a potassium level of 3.0, and fentanyl for complaints of pain.  A CT of the affected area failed to disclose an abscess but did show significant swelling.  Labs were notable for mild leukocytosis and a potassium of 3.0, glucose 166.  Labs otherwise unremarkable.  She was subsequently referred for admission given the severity of her cellulitis.  Assessment & Plan:   Principal Problem:   Abscess of pubic region Active Problems:   Rheumatoid arthritis (HCC)   Anxiety   Prolonged QT interval   Bipolar I disorder (HCC)   Uncontrolled type 2 diabetes  mellitus with hyperglycemia, without long-term current use of insulin (HCC)   Cellulitis   Cellulitis of groin  Cellulitis of the pubic region: Abscess ruled out by the CT scan.  Severe cellulitis.  Leukocytosis resolved and she is afebrile, still has some drainage but no evidence only serosanguineous, not purulent as it was.  On examination, erythema has receded almost 70%, she is minimally tender, much improved.  Overall significant improvement in last 3 days.  Still needs IV antibiotics at least for another day or 2.  Will continue vancomycin.  Plan to discharge on Bactrim DS upon discharge.   Hypokalemia: Replenished.  Bipolar 1 disorder: Continue Invega.  Type 2 diabetes mellitus: Apparently she is not taking her sugars at home and last hemoglobin A1c was 1 year ago which was 10.7.  She is on metformin.  Hemoglobin A1c 6.7.  I will hold metformin in the hospital setting.  She has been started on SSI and Premeal regimen/8 units of NovoLog 3 times daily which I will continue.  She appears to be taking 35 units of Lantus at home however with this much improvement of hemoglobin A1c and the fact that we are not sure about her compliance, we will hold off to long-acting insulin for now.  Class I obesity: BMI 30.77.  Weight loss and diet modification counseled.  Prolonged QTc: Avoid QTc prolonging medications and keep electrolytes within normal range.  Anxiety: Continue as needed Ativan.  DVT prophylaxis: enoxaparin (LOVENOX) injection 40 mg Start: 01/09/24 2200   Code Status: Full Code  Family  Communication:  None present at bedside.  Plan of care discussed with patient in length and he/she verbalized understanding and agreed with it.  Status is: Inpatient Remains inpatient appropriate because: Will need IV antibiotics for at least 1-2 more days.     Estimated body mass index is 30.77 kg/m as calculated from the following:   Height as of this encounter: 5\' 4"  (1.626 m).   Weight as of  this encounter: 81.3 kg.    Nutritional Assessment: Body mass index is 30.77 kg/m.Marland Kitchen Seen by dietician.  I agree with the assessment and plan as outlined below: Nutrition Status:        . Skin Assessment: I have examined the patient's skin and I agree with the wound assessment as performed by the wound care RN as outlined below:    Consultants:  None  Procedures:  None  Antimicrobials:  Anti-infectives (From admission, onward)    Start     Dose/Rate Route Frequency Ordered Stop   01/10/24 0130  vancomycin (VANCOREADY) IVPB 750 mg/150 mL        750 mg 150 mL/hr over 60 Minutes Intravenous Every 12 hours 01/09/24 1604 01/17/24 0129   01/09/24 1700  vancomycin (VANCOCIN) IVPB 1000 mg/200 mL premix  Status:  Discontinued        1,000 mg 200 mL/hr over 60 Minutes Intravenous  Once 01/09/24 1613 01/09/24 1615   01/09/24 1330  vancomycin (VANCOCIN) IVPB 1000 mg/200 mL premix        1,000 mg 200 mL/hr over 60 Minutes Intravenous  Once 01/09/24 1325 01/09/24 1441         Subjective: Patient seen and examined.  She says that she feels much better.  She actually was requesting to discharge but after counseling, she understands that she needs to stay for more IV antibiotics.  Objective: Vitals:   01/11/24 0448 01/11/24 1258 01/11/24 2017 01/12/24 0456  BP: 102/63 (!) 89/54 100/62 92/62  Pulse: 77 63 68 73  Resp: 17 18 16 16   Temp: 97.9 F (36.6 C) 97.9 F (36.6 C) 97.8 F (36.6 C) 98 F (36.7 C)  TempSrc: Oral Oral Oral Oral  SpO2: 98% 98% 96% 95%  Weight:      Height:        Intake/Output Summary (Last 24 hours) at 01/12/2024 1031 Last data filed at 01/11/2024 1300 Gross per 24 hour  Intake 240 ml  Output --  Net 240 ml   Filed Weights   01/09/24 0949 01/09/24 1551  Weight: 83.5 kg 81.3 kg    Examination:  Examination was performed in front of female chaperone Tonna Boehringer, RN General exam: Appears calm and comfortable  Respiratory system: Clear to  auscultation. Respiratory effort normal. Cardiovascular system: S1 & S2 heard, RRR. No JVD, murmurs, rubs, gallops or clicks. No pedal edema. Gastrointestinal system: Abdomen is nondistended, soft and nontender. No organomegaly or masses felt. Normal bowel sounds heard.  Much improved today, in the groin region, not warm, very minimal tenderness.  Still with serous drainage. Central nervous system: Alert and oriented. No focal neurological deficits. Extremities: Symmetric 5 x 5 power. Skin: No rashes, lesions or ulcers.  Psychiatry: Judgement and insight appear normal. Mood & affect appropriate.   Data Reviewed: I have personally reviewed following labs and imaging studies  CBC: Recent Labs  Lab 01/09/24 1004 01/09/24 1643 01/10/24 0451 01/12/24 0452  WBC 12.2* 10.8* 8.9 5.0  NEUTROABS 10.1*  --   --  3.5  HGB 14.2 12.4 12.0  12.3  HCT 39.7 36.7 34.3* 36.4  MCV 86.5 89.5 89.1 90.3  PLT 292 269 251 262   Basic Metabolic Panel: Recent Labs  Lab 01/09/24 1004 01/09/24 1643 01/10/24 0451 01/12/24 0452  NA 136  --  137 131*  K 3.0*  --  3.2* 3.9  CL 98  --  102 98  CO2 27  --  25 24  GLUCOSE 166*  --  116* 103*  BUN 8  --  7 8  CREATININE 0.63 0.51 0.57 0.51  CALCIUM 8.8*  --  8.6* 8.5*   GFR: Estimated Creatinine Clearance: 96.4 mL/min (by C-G formula based on SCr of 0.51 mg/dL). Liver Function Tests: No results for input(s): "AST", "ALT", "ALKPHOS", "BILITOT", "PROT", "ALBUMIN" in the last 168 hours. No results for input(s): "LIPASE", "AMYLASE" in the last 168 hours. No results for input(s): "AMMONIA" in the last 168 hours. Coagulation Profile: No results for input(s): "INR", "PROTIME" in the last 168 hours. Cardiac Enzymes: No results for input(s): "CKTOTAL", "CKMB", "CKMBINDEX", "TROPONINI" in the last 168 hours. BNP (last 3 results) No results for input(s): "PROBNP" in the last 8760 hours. HbA1C: Recent Labs    01/09/24 1643  HGBA1C 6.7*   CBG: Recent Labs   Lab 01/11/24 0810 01/11/24 1209 01/11/24 1628 01/11/24 2112 01/12/24 0753  GLUCAP 97 133* 123* 112* 81   Lipid Profile: No results for input(s): "CHOL", "HDL", "LDLCALC", "TRIG", "CHOLHDL", "LDLDIRECT" in the last 72 hours. Thyroid Function Tests: No results for input(s): "TSH", "T4TOTAL", "FREET4", "T3FREE", "THYROIDAB" in the last 72 hours. Anemia Panel: No results for input(s): "VITAMINB12", "FOLATE", "FERRITIN", "TIBC", "IRON", "RETICCTPCT" in the last 72 hours. Sepsis Labs: No results for input(s): "PROCALCITON", "LATICACIDVEN" in the last 168 hours.  Recent Results (from the past 240 hours)  MRSA Next Gen by PCR, Nasal     Status: Abnormal   Collection Time: 01/10/24 12:50 PM   Specimen: Nasal Mucosa; Nasal Swab  Result Value Ref Range Status   MRSA by PCR Next Gen DETECTED (A) NOT DETECTED Final    Comment: RESULT CALLED TO, READ BACK BY AND VERIFIED WITH: ROSE, B RN AT 1652 ON 01/10/2024 BY Deedra Ehrich, K (NOTE) The GeneXpert MRSA Assay (FDA approved for NASAL specimens only), is one component of a comprehensive MRSA colonization surveillance program. It is not intended to diagnose MRSA infection nor to guide or monitor treatment for MRSA infections. Test performance is not FDA approved in patients less than 47 years old. Performed at [facility], [address] [place], [place] [address]      Radiology Studies: No results found.   Scheduled Meds:  cariprazine  3 mg Oral Daily   Chlorhexidine Gluconate Cloth  6 each Topical Daily   enoxaparin (LOVENOX) injection  40 mg Subcutaneous QHS   insulin aspart  0-15 Units Subcutaneous TID WC   insulin aspart  0-5 Units Subcutaneous QHS   insulin aspart  8 Units Subcutaneous TID WC   mupirocin ointment  1 Application Nasal BID   paliperidone  6 mg Oral QHS   ramelteon  8 mg Oral QHS   sodium chloride flush  3 mL Intravenous Q12H   Continuous Infusions:  vancomycin 750 mg (01/12/24 0215)     LOS: 2  days   Hughie Closs, MD [facility]  01/12/2024, 10:31 AM   *Please note that this is a verbal dictation therefore any spelling or grammatical errors are due to the "Dragon [employer] One" system interpretation.  Please page  via Amion and do not message via secure chat for urgent patient care matters. Secure chat can be used for non urgent patient care matters.  How to contact the [facility] Attending or Consulting provider 7A - 7P or covering provider during after hours 7P -7A, for this patient?  Check the care team in [facility] and look for a) attending/consulting [facility] provider listed and b) the [facility] team listed. Page or secure chat 7A-7P. Log into [email or web address] and use [facility] universal password to access. If you do not have the password, please contact the hospital operator. Locate the [facility] provider you are looking for under [facility] and page to a number that you can be directly reached. If you still have difficulty reaching the provider, please page the [facility] (Director on Call) for the Hospitalists listed on amion for assistance.

## 2024-01-13 ENCOUNTER — Other Ambulatory Visit (HOSPITAL_COMMUNITY): Payer: Self-pay

## 2024-01-13 DIAGNOSIS — L02219 Cutaneous abscess of trunk, unspecified: Secondary | ICD-10-CM | POA: Diagnosis not present

## 2024-01-13 LAB — GLUCOSE, CAPILLARY
Glucose-Capillary: 108 mg/dL — ABNORMAL HIGH (ref 70–99)
Glucose-Capillary: 109 mg/dL — ABNORMAL HIGH (ref 70–99)
Glucose-Capillary: 171 mg/dL — ABNORMAL HIGH (ref 70–99)

## 2024-01-13 MED ORDER — SULFAMETHOXAZOLE-TRIMETHOPRIM 800-160 MG PO TABS
1.0000 | ORAL_TABLET | Freq: Two times a day (BID) | ORAL | 0 refills | Status: AC
  Filled 2024-01-13: qty 20, 10d supply, fill #0

## 2024-01-13 NOTE — Plan of Care (Signed)
  Problem: Education: Goal: Knowledge of General Education information will improve Description: Including pain rating scale, medication(s)/side effects and non-pharmacologic comfort measures Outcome: Adequate for Discharge   Problem: Health Behavior/Discharge Planning: Goal: Ability to manage health-related needs will improve Outcome: Adequate for Discharge   Problem: Clinical Measurements: Goal: Ability to maintain clinical measurements within normal limits will improve Outcome: Adequate for Discharge Goal: Will remain free from infection Outcome: Adequate for Discharge Goal: Diagnostic test results will improve Outcome: Adequate for Discharge Goal: Respiratory complications will improve Outcome: Adequate for Discharge Goal: Cardiovascular complication will be avoided Outcome: Adequate for Discharge   Problem: Activity: Goal: Risk for activity intolerance will decrease Outcome: Adequate for Discharge   Problem: Clinical Measurements: Goal: Ability to maintain clinical measurements within normal limits will improve Outcome: Adequate for Discharge Goal: Will remain free from infection Outcome: Adequate for Discharge Goal: Diagnostic test results will improve Outcome: Adequate for Discharge Goal: Respiratory complications will improve Outcome: Adequate for Discharge Goal: Cardiovascular complication will be avoided Outcome: Adequate for Discharge   Problem: Nutrition: Goal: Adequate nutrition will be maintained Outcome: Adequate for Discharge   Problem: Coping: Goal: Level of anxiety will decrease Outcome: Adequate for Discharge   Problem: Elimination: Goal: Will not experience complications related to bowel motility Outcome: Adequate for Discharge Goal: Will not experience complications related to urinary retention Outcome: Adequate for Discharge   Problem: Pain Managment: Goal: General experience of comfort will improve and/or be controlled Outcome: Adequate for  Discharge   Problem: Safety: Goal: Ability to remain free from injury will improve Outcome: Adequate for Discharge   Problem: Skin Integrity: Goal: Risk for impaired skin integrity will decrease Outcome: Adequate for Discharge   Problem: Clinical Measurements: Goal: Ability to avoid or minimize complications of infection will improve Outcome: Adequate for Discharge   Problem: Skin Integrity: Goal: Skin integrity will improve Outcome: Adequate for Discharge   Problem: Education: Goal: Ability to describe self-care measures that may prevent or decrease complications (Diabetes Survival Skills Education) will improve Outcome: Adequate for Discharge Goal: Individualized Educational Video(s) Outcome: Adequate for Discharge   Problem: Coping: Goal: Ability to adjust to condition or change in health will improve Outcome: Adequate for Discharge   Problem: Fluid Volume: Goal: Ability to maintain a balanced intake and output will improve Outcome: Adequate for Discharge   Problem: Health Behavior/Discharge Planning: Goal: Ability to identify and utilize available resources and services will improve Outcome: Adequate for Discharge Goal: Ability to manage health-related needs will improve Outcome: Adequate for Discharge   Problem: Metabolic: Goal: Ability to maintain appropriate glucose levels will improve Outcome: Adequate for Discharge   Problem: Nutritional: Goal: Maintenance of adequate nutrition will improve Outcome: Adequate for Discharge Goal: Progress toward achieving an optimal weight will improve Outcome: Adequate for Discharge   Problem: Skin Integrity: Goal: Risk for impaired skin integrity will decrease Outcome: Adequate for Discharge   Problem: Tissue Perfusion: Goal: Adequacy of tissue perfusion will improve Outcome: Adequate for Discharge

## 2024-01-13 NOTE — Progress Notes (Addendum)
 Meds delivered from [facility] outpatient pharmacy By this rn

## 2024-01-13 NOTE — Plan of Care (Signed)
  Problem: Clinical Measurements: Goal: Diagnostic test results will improve Outcome: Progressing   Problem: Skin Integrity: Goal: Risk for impaired skin integrity will decrease Outcome: Progressing   Problem: Tissue Perfusion: Goal: Adequacy of tissue perfusion will improve Outcome: Progressing

## 2024-01-13 NOTE — Discharge Summary (Signed)
 Physician Discharge Summary  Erika Rhodes QIH:[id number] DOB: 1981/09/30 DOA: 01/09/2024  PCP: Mendel Ryder, PA-C  Admit date: 01/09/2024 Discharge date: 01/13/2024 30 Day Unplanned Readmission Risk Score    Flowsheet Row ED to Hosp-Admission (Current) from 01/09/2024 in [facility] SURGERY  30 Day Unplanned Readmission Risk Score (%) 24.09 Filed at 01/13/2024 0801       This score is the patient's risk of an unplanned readmission within 30 days of being discharged (0 -100%). The score is based on dignosis, age, lab data, medications, orders, and past utilization.   Low:  0-14.9   Medium: 15-21.9   High: 22-29.9   Extreme: 30 and above          Admitted From: Home Disposition: Home  Recommendations for Outpatient Follow-up:  Follow up with PCP in 1-2 weeks Please obtain BMP/CBC in one week Please follow up with your PCP on the following pending results: Unresulted Labs (From admission, onward)     Start     Ordered   01/16/24 0500  Creatinine, serum  (enoxaparin (LOVENOX)    CrCl >/= 30 ml/min)  Weekly,   R     Comments: while on enoxaparin therapy    01/09/24 1613              Home Health: None Equipment/Devices: None  Discharge Condition: Stable CODE STATUS: Full code Diet recommendation: Cardiac/diabetic  Subjective: Seen and examined, she says that her pain is very well-controlled and she is ready to go home.  Brief/Interim Summary: Erika Rhodes is an 43 y.o. female with a PMH of RA, DM, anxiety, asthma, bipolar disorder who presents to the hospital with suspected MRSA abscess/cellulitis.  She has a history of facial cellulitis, treated 1 year ago with Vancomycin/hospitalization, and has intermittently been on steroid therapy during rheumatoid arthritis flares.  The last time she used steroids was in early December.  She reports that she is not currently taking any maintenance medications for her rheumatoid arthritis.  The current  skin eruption began around 4-5 days ago and has gotten worse over that time.  She saw a telemetry doctor who prescribed her doxycycline and she has taken this for the past 3-4 days without any noticeable improvement.  The skin on her mons pubis was significantly erythematous with significant swelling in the area involved tracking around laterally to her hip.  There was a small ulceration that was draining serosanguineous/purulent drainage.  The only other associated symptoms she has had is some mild nausea but no vomiting and some subjective fever/chills.  Although diabetic, she does not routinely check her blood sugars and she has not had a screening A1c in about 1 year or taking any insulin.  Upon arrival she was hemodynamically stable except low potassium. A CT of the affected area failed to disclose an abscess but did show significant swelling.  Labs were notable for mild leukocytosis and a potassium of 3.0, glucose 166.  Labs otherwise unremarkable.  She was subsequently admitted to hospital service.  Details below.   Cellulitis of the pubic region: Abscess ruled out by the CT scan.  Severe cellulitis.  Leukocytosis resolved and she is afebrile, erythema has improved at least 80 to 85% and there is no more drainage.  She wants to go home and I think that is reasonable.  She received vancomycin during the whole course of hospitalization.  No cultures were sent through the ED from the drainage.  However she was tested to be positive  for MRSA on nex gen.  Her infection could likely be secondary to MRSA.  I am discharging her on 10 more days of oral Bactrim DS.  Hypokalemia: Replenished and resolved.   Bipolar 1 disorder: Continue Invega.   Type 2 diabetes mellitus: Apparently she was not checking her blood sugar at home or taking any insulin, she was only taking metformin.  Her hemoglobin A1c a year ago was 10.7 and now it was 6.7.  We only placed her on SSI blood sugar was controlled.  Resuming on  metformin.  Since she was not taking insulin, I have discontinued those.   Class I obesity: BMI 30.77.  Weight loss and diet modification counseled.   Prolonged QTc: Avoided QTc prolonging medications and keep electrolytes within normal range.   Anxiety: Continue as needed Ativan.  Please note that the medications that are shown below as discontinued, are the medications that patient was not taking.  Discharge plan was discussed with patient and/or family member and they verbalized understanding and agreed with it.  Discharge Diagnoses:  Principal Problem:   Abscess of pubic region Active Problems:   Rheumatoid arthritis (HCC)   Anxiety   Prolonged QT interval   Bipolar I disorder (HCC)   Uncontrolled type 2 diabetes mellitus with hyperglycemia, without long-term current use of insulin (HCC)   Cellulitis   Cellulitis of groin    Discharge Instructions   Allergies as of 01/13/2024       Reactions   Z-pak [azithromycin] Anaphylaxis   Geodon [ziprasidone] Swelling, Other (See Comments)   Dystonic reaction, slurred speech, unable to walk.    Haldol [haloperidol] Swelling, Other (See Comments)   Dystonic reaction, slurred speech, unable to walk.    Olanzapine Swelling   Dystonic reactiom   Rexulti [brexpiprazole] Swelling, Other (See Comments)   Dystonic reaction, slurred speech, unable to walk.    Risperidone And Related Swelling, Other (See Comments)   Dystonic reaction, slurred speech, unable to walk.         Medication List     STOP taking these medications    amoxicillin 500 MG capsule Commonly known as: AMOXIL   benzonatate 100 MG capsule Commonly known as: TESSALON   benztropine 0.5 MG tablet Commonly known as: COGENTIN   diazepam 5 MG tablet Commonly known as: Valium   docusate sodium 100 MG capsule Commonly known as: COLACE   doxycycline 100 MG capsule Commonly known as: VIBRAMYCIN   fluticasone 50 MCG/ACT nasal spray Commonly known as:  FLONASE   HYDROcodone-acetaminophen 5-325 MG tablet Commonly known as: NORCO/VICODIN   insulin detemir 100 UNIT/ML injection Commonly known as: LEVEMIR   nicotine 14 mg/24hr patch Commonly known as: NICODERM CQ - dosed in mg/24 hours   nicotine polacrilex 2 MG gum Commonly known as: NICORETTE   ondansetron 4 MG disintegrating tablet Commonly known as: ZOFRAN-ODT   oxyCODONE 5 MG immediate release tablet Commonly known as: Roxicodone   paliperidone 6 MG 24 hr tablet Commonly known as: INVEGA   predniSONE 50 MG tablet Commonly known as: DELTASONE   traZODone 50 MG tablet Commonly known as: DESYREL   valACYclovir 1000 MG tablet Commonly known as: VALTREX       TAKE these medications    albuterol 108 (90 Base) MCG/ACT inhaler Commonly known as: VENTOLIN HFA Inhale 1-2 puffs into the lungs every 6 (six) hours as needed for wheezing or shortness of breath.   blood glucose meter kit and supplies Dispense based on patient and insurance preference. Use  up to four times daily as directed. (FOR ICD-10 E10.9, E11.9).   citalopram 20 MG tablet Commonly known as: CELEXA Take 20 mg by mouth daily.   EPINEPHrine 0.3 mg/0.3 mL Soaj injection Commonly known as: EPI-PEN Inject 0.3 mg into the muscle as needed for anaphylaxis.   gabapentin 400 MG capsule Commonly known as: NEURONTIN Take 1 capsule (400 mg total) by mouth 3 (three) times daily. What changed: when to take this   ibuprofen 200 MG tablet Commonly known as: ADVIL Take 800 mg by mouth as needed for mild pain or moderate pain. What changed: Another medication with the same name was removed. Continue taking this medication, and follow the directions you see here.   insulin aspart 100 UNIT/ML injection Commonly known as: novoLOG Inject 0-20 Units into the skin 3 (three) times daily with meals. CBG 70 - 120: 0 units.CBG 121 - 150: 3 units.CBG 151 - 200: 4 units.CBG 201 - 250: 7 units.CBG 251 - 300: 11 units.CBG 301  - 350: 15 units.CBG 351 - 400: 20 units.CBG > 400 call 911 What changed: Another medication with the same name was removed. Continue taking this medication, and follow the directions you see here.   LORazepam 1 MG tablet Commonly known as: ATIVAN Take 1 mg by mouth 2 (two) times daily.   metFORMIN 750 MG 24 hr tablet Commonly known as: GLUCOPHAGE-XR Take 1 tablet (750 mg total) by mouth 2 (two) times daily with a meal. What changed: when to take this   ramelteon 8 MG tablet Commonly known as: ROZEREM Take 8 mg by mouth at bedtime.   sulfamethoxazole-trimethoprim 800-160 MG tablet Commonly known as: Bactrim DS Take 1 tablet by mouth 2 (two) times daily for 10 days.   Vraylar 3 MG capsule Generic drug: cariprazine Take 3 mg by mouth daily.        Follow-up Information     Mendel Ryder, PA-C Follow up in 1 week(s).   Specialty: Family Medicine Contact information: [address] [place] [address] [phone]                Allergies  Allergen Reactions   Z-Pak [Azithromycin] Anaphylaxis   Geodon [Ziprasidone] Swelling and Other (See Comments)    Dystonic reaction, slurred speech, unable to walk.    Haldol [Haloperidol] Swelling and Other (See Comments)    Dystonic reaction, slurred speech, unable to walk.    Olanzapine Swelling    Dystonic reactiom   Rexulti [Brexpiprazole] Swelling and Other (See Comments)    Dystonic reaction, slurred speech, unable to walk.    Risperidone And Related Swelling and Other (See Comments)    Dystonic reaction, slurred speech, unable to walk.     Consultations: None   Procedures/Studies: CT PELVIS W CONTRAST Result Date: 01/09/2024 CLINICAL DATA:  Abscess in right pubic and inguinal region for 5 days, bloody drainage, pain with ambulation EXAM: CT PELVIS WITH CONTRAST TECHNIQUE: Multidetector CT imaging of the pelvis was performed using the standard protocol following the bolus administration of intravenous  contrast. RADIATION DOSE REDUCTION: This exam was performed according to the departmental dose-optimization program which includes automated exposure control, adjustment of the mA and/or kV according to patient size and/or use of iterative reconstruction technique. CONTRAST:  75mL OMNIPAQUE IOHEXOL 300 MG/ML  SOLN COMPARISON:  11/29/2020 FINDINGS: Urinary Tract: The distal ureters and bladder are unremarkable. No urinary tract calculi. Bowel: No bowel obstruction or ileus. Normal appendix right lower quadrant. No bowel wall thickening or  inflammatory change. Vascular/Lymphatic: Right inguinal and external iliac chain adenopathy, likely reactive. Largest lymph node in the right external iliac region image 69/303 measures 16 mm in short axis. No significant vascular findings. Reproductive: Prior hysterectomy. The right ovary measures 4.2 x 2.7 x 2.7 cm, with a normal appearing 1.6 cm follicle identified. The left ovary measures 2.3 x 2.2 x 1.4 cm. Other: No free fluid or free intraperitoneal gas. No abdominal wall hernia. Musculoskeletal: There is extensive subcutaneous fat stranding involving the right inguinal crease, extending into the right side of the mons pubis. Ill-defined fluid is seen within the subcutaneous tissues, but there is no organized fluid collection or abscess at this time. No acute or destructive bony abnormalities. Reconstructed images demonstrate no additional findings. IMPRESSION: 1. Extensive subcutaneous fat stranding with ill-defined fluid in the subcutaneous tissues involving the right inguinal region and extending into the right side of the mons pubis, most consistent with cellulitis. There is no organized fluid collection or abscess at this time. 2. Reactive adenopathy within the right inguinal and external iliac chains as above. Electronically Signed   By: Sharlet Salina M.D.   On: 01/09/2024 12:14     Discharge Exam: Vitals:   01/12/24 1935 01/13/24 0457  BP: 108/70 101/71   Pulse: 77 88  Resp: 16 16  Temp: 98.1 F (36.7 C) 97.9 F (36.6 C)  SpO2: 97% 96%   Vitals:   01/12/24 0456 01/12/24 1229 01/12/24 1935 01/13/24 0457  BP: 92/62 101/60 108/70 101/71  Pulse: 73 77 77 88  Resp: 16 18 16 16   Temp: 98 F (36.7 C) 97.8 F (36.6 C) 98.1 F (36.7 C) 97.9 F (36.6 C)  TempSrc: Oral Oral Oral Oral  SpO2: 95% 98% 97% 96%  Weight:      Height:       I examined this patient again in front of the female chaperone named Marylene Land which was the nurse tech today. General: Pt is alert, awake, not in acute distress Cardiovascular: RRR, S1/S2 +, no rubs, no gallops Respiratory: CTA bilaterally, no wheezing, no rhonchi Abdominal: Soft, NT, ND, bowel sounds +, erythema at the pubic area has improved significantly, minimal tenderness, no drainage.  No warmth. Extremities: no edema, no cyanosis    The results of significant diagnostics from this hospitalization (including imaging, microbiology, ancillary and laboratory) are listed below for reference.     Microbiology: Recent Results (from the past 240 hours)  MRSA Next Gen by PCR, Nasal     Status: Abnormal   Collection Time: 01/10/24 12:50 PM   Specimen: Nasal Mucosa; Nasal Swab  Result Value Ref Range Status   MRSA by PCR Next Gen DETECTED (A) NOT DETECTED Final    Comment: RESULT CALLED TO, READ BACK BY AND VERIFIED WITH: ROSE, B RN AT 1652 ON 01/10/2024 BY Deedra Ehrich, K (NOTE) The GeneXpert MRSA Assay (FDA approved for NASAL specimens only), is one component of a comprehensive MRSA colonization surveillance program. It is not intended to diagnose MRSA infection nor to guide or monitor treatment for MRSA infections. Test performance is not FDA approved in patients less than 54 years old. Performed at [facility], [address] [place], [place] [address]      Labs: BNP (last 3 results) No results for input(s): "BNP" in the last 8760 hours. Basic Metabolic Panel: Recent Labs  Lab  01/09/24 1004 01/09/24 1643 01/10/24 0451 01/12/24 0452  NA 136  --  137 131*  K 3.0*  --  3.2* 3.9  CL 98  --  102 98  CO2 27  --  25 24  GLUCOSE 166*  --  116* 103*  BUN 8  --  7 8  CREATININE 0.63 0.51 0.57 0.51  CALCIUM 8.8*  --  8.6* 8.5*   Liver Function Tests: No results for input(s): "AST", "ALT", "ALKPHOS", "BILITOT", "PROT", "ALBUMIN" in the last 168 hours. No results for input(s): "LIPASE", "AMYLASE" in the last 168 hours. No results for input(s): "AMMONIA" in the last 168 hours. CBC: Recent Labs  Lab 01/09/24 1004 01/09/24 1643 01/10/24 0451 01/12/24 0452  WBC 12.2* 10.8* 8.9 5.0  NEUTROABS 10.1*  --   --  3.5  HGB 14.2 12.4 12.0 12.3  HCT 39.7 36.7 34.3* 36.4  MCV 86.5 89.5 89.1 90.3  PLT 292 269 251 262   Cardiac Enzymes: No results for input(s): "CKTOTAL", "CKMB", "CKMBINDEX", "TROPONINI" in the last 168 hours. BNP: Invalid input(s): "POCBNP" CBG: Recent Labs  Lab 01/12/24 0753 01/12/24 1135 01/12/24 1633 01/12/24 2102 01/13/24 0816  GLUCAP 81 147* 89 108* 109*   D-Dimer No results for input(s): "DDIMER" in the last 72 hours. Hgb A1c No results for input(s): "HGBA1C" in the last 72 hours. Lipid Profile No results for input(s): "CHOL", "HDL", "LDLCALC", "TRIG", "CHOLHDL", "LDLDIRECT" in the last 72 hours. Thyroid function studies No results for input(s): "TSH", "T4TOTAL", "T3FREE", "THYROIDAB" in the last 72 hours.  Invalid input(s): "FREET3" Anemia work up No results for input(s): "VITAMINB12", "FOLATE", "FERRITIN", "TIBC", "IRON", "RETICCTPCT" in the last 72 hours. Urinalysis    Component Value Date/Time   COLORURINE YELLOW 12/09/2022 2148   APPEARANCEUR CLEAR 12/09/2022 2148   LABSPEC 1.042 (H) 12/09/2022 2148   PHURINE 6.0 12/09/2022 2148   GLUCOSEU >=500 (A) 12/09/2022 2148   HGBUR NEGATIVE 12/09/2022 2148   BILIRUBINUR NEGATIVE 12/09/2022 2148   KETONESUR NEGATIVE 12/09/2022 2148   PROTEINUR NEGATIVE 12/09/2022 2148   NITRITE  NEGATIVE 12/09/2022 2148   LEUKOCYTESUR NEGATIVE 12/09/2022 2148   Sepsis Labs Recent Labs  Lab 01/09/24 1004 01/09/24 1643 01/10/24 0451 01/12/24 0452  WBC 12.2* 10.8* 8.9 5.0   Microbiology Recent Results (from the past 240 hours)  MRSA Next Gen by PCR, Nasal     Status: Abnormal   Collection Time: 01/10/24 12:50 PM   Specimen: Nasal Mucosa; Nasal Swab  Result Value Ref Range Status   MRSA by PCR Next Gen DETECTED (A) NOT DETECTED Final    Comment: RESULT CALLED TO, READ BACK BY AND VERIFIED WITH: ROSE, B RN AT 1652 ON 01/10/2024 BY Deedra Ehrich, K (NOTE) The GeneXpert MRSA Assay (FDA approved for NASAL specimens only), is one component of a comprehensive MRSA colonization surveillance program. It is not intended to diagnose MRSA infection nor to guide or monitor treatment for MRSA infections. Test performance is not FDA approved in patients less than 31 years old. Performed at [facility], [address] [place], [place] [address]     FURTHER DISCHARGE INSTRUCTIONS:   Get Medicines reviewed and adjusted: Please take all your medications with you for your next visit with your Primary MD   Laboratory/radiological data: Please request your Primary MD to go over all hospital tests and procedure/radiological results at the follow up, please ask your Primary MD to get all Hospital records sent to his/her office.   In some cases, they will be blood work, cultures and biopsy results pending at the time of your discharge. Please request that your primary care M.D. goes through  all the records of your hospital data and follows up on these results.   Also Note the following: If you experience worsening of your admission symptoms, develop shortness of breath, life threatening emergency, suicidal or homicidal thoughts you must seek medical attention immediately by calling 911 or calling your MD immediately  if symptoms less severe.   You must read complete  instructions/literature along with all the possible adverse reactions/side effects for all the Medicines you take and that have been prescribed to you. Take any new Medicines after you have completely understood and accpet all the possible adverse reactions/side effects.    Do not drive when taking Pain medications or sleeping medications (Benzodaizepines)   Do not take more than prescribed Pain, Sleep and Anxiety Medications. It is not advisable to combine anxiety,sleep and pain medications without talking with your primary care practitioner   Special Instructions: If you have smoked or chewed Tobacco  in the last 2 yrs please stop smoking, stop any regular Alcohol  and or any Recreational drug use.   Wear Seat belts while driving.   Please note: You were cared for by a hospitalist during your hospital stay. Once you are discharged, your primary care physician will handle any further medical issues. Please note that NO REFILLS for any discharge medications will be authorized once you are discharged, as it is imperative that you return to your primary care physician (or establish a relationship with a primary care physician if you do not have one) for your post hospital discharge needs so that they can reassess your need for medications and monitor your lab values  Time coordinating discharge: Over 30 minutes  SIGNED:   Hughie Closs, MD  [facility] 01/13/2024, 9:57 AM *Please note that this is a verbal dictation therefore any spelling or grammatical errors are due to the "[employer] One" system interpretation. If 7PM-7AM, please contact night-coverage [email or web address]
